# Patient Record
Sex: Female | Born: 1960 | Race: Black or African American | Hispanic: No | Marital: Married | Smoking: Never smoker
Health system: Southern US, Community
[De-identification: ages and names within clinical notes are randomized; demographics above are authoritative.]

## PROBLEM LIST (undated history)

## (undated) DIAGNOSIS — D649 Anemia, unspecified: Secondary | ICD-10-CM

## (undated) DIAGNOSIS — M1990 Unspecified osteoarthritis, unspecified site: Secondary | ICD-10-CM

## (undated) DIAGNOSIS — Q6102 Congenital multiple renal cysts: Secondary | ICD-10-CM

## (undated) DIAGNOSIS — Z8719 Personal history of other diseases of the digestive system: Secondary | ICD-10-CM

## (undated) DIAGNOSIS — R600 Localized edema: Secondary | ICD-10-CM

## (undated) DIAGNOSIS — R7303 Prediabetes: Secondary | ICD-10-CM

## (undated) DIAGNOSIS — D571 Sickle-cell disease without crisis: Secondary | ICD-10-CM

## (undated) DIAGNOSIS — E785 Hyperlipidemia, unspecified: Secondary | ICD-10-CM

## (undated) DIAGNOSIS — D573 Sickle-cell trait: Secondary | ICD-10-CM

## (undated) DIAGNOSIS — I639 Cerebral infarction, unspecified: Secondary | ICD-10-CM

## (undated) DIAGNOSIS — F32A Depression, unspecified: Secondary | ICD-10-CM

## (undated) DIAGNOSIS — D219 Benign neoplasm of connective and other soft tissue, unspecified: Secondary | ICD-10-CM

## (undated) DIAGNOSIS — M179 Osteoarthritis of knee, unspecified: Secondary | ICD-10-CM

## (undated) DIAGNOSIS — T7840XA Allergy, unspecified, initial encounter: Secondary | ICD-10-CM

## (undated) DIAGNOSIS — F329 Major depressive disorder, single episode, unspecified: Secondary | ICD-10-CM

## (undated) DIAGNOSIS — Z780 Asymptomatic menopausal state: Secondary | ICD-10-CM

## (undated) DIAGNOSIS — M722 Plantar fascial fibromatosis: Secondary | ICD-10-CM

## (undated) DIAGNOSIS — R310 Gross hematuria: Secondary | ICD-10-CM

## (undated) DIAGNOSIS — J302 Other seasonal allergic rhinitis: Secondary | ICD-10-CM

## (undated) DIAGNOSIS — Z86010 Personal history of colonic polyps: Secondary | ICD-10-CM

## (undated) DIAGNOSIS — N172 Acute kidney failure with medullary necrosis: Secondary | ICD-10-CM

## (undated) DIAGNOSIS — E669 Obesity, unspecified: Secondary | ICD-10-CM

## (undated) DIAGNOSIS — M1710 Unilateral primary osteoarthritis, unspecified knee: Secondary | ICD-10-CM

## (undated) DIAGNOSIS — K5730 Diverticulosis of large intestine without perforation or abscess without bleeding: Secondary | ICD-10-CM

## (undated) HISTORY — DX: Plantar fascial fibromatosis: M72.2

## (undated) HISTORY — PX: POLYPECTOMY: SHX149

## (undated) HISTORY — DX: Sickle-cell disease without crisis: D57.1

## (undated) HISTORY — DX: Sickle-cell trait: D57.3

## (undated) HISTORY — DX: Asymptomatic menopausal state: Z78.0

## (undated) HISTORY — DX: Allergy, unspecified, initial encounter: T78.40XA

## (undated) HISTORY — DX: Acute kidney failure with medullary necrosis: N17.2

## (undated) HISTORY — PX: COLONOSCOPY: SHX174

## (undated) HISTORY — DX: Hyperlipidemia, unspecified: E78.5

## (undated) HISTORY — DX: Obesity, unspecified: E66.9

## (undated) HISTORY — DX: Unspecified osteoarthritis, unspecified site: M19.90

---
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
## 1998-08-28 ENCOUNTER — Encounter: Admission: RE | Admit: 1998-08-28 | Discharge: 1998-08-28 | Payer: Self-pay | Admitting: Family Medicine

## 1998-09-04 ENCOUNTER — Encounter: Admission: RE | Admit: 1998-09-04 | Discharge: 1998-09-04 | Payer: Self-pay | Admitting: Family Medicine

## 1998-09-04 ENCOUNTER — Other Ambulatory Visit: Admission: RE | Admit: 1998-09-04 | Discharge: 1998-09-04 | Payer: Self-pay | Admitting: *Deleted

## 1998-09-24 ENCOUNTER — Encounter: Admission: RE | Admit: 1998-09-24 | Discharge: 1998-09-24 | Payer: Self-pay | Admitting: Sports Medicine

## 1999-10-06 ENCOUNTER — Encounter: Admission: RE | Admit: 1999-10-06 | Discharge: 1999-10-06 | Payer: Self-pay | Admitting: Family Medicine

## 1999-10-11 ENCOUNTER — Emergency Department (HOSPITAL_COMMUNITY): Admission: EM | Admit: 1999-10-11 | Discharge: 1999-10-11 | Payer: Self-pay | Admitting: Emergency Medicine

## 1999-10-12 ENCOUNTER — Encounter: Payer: Self-pay | Admitting: Emergency Medicine

## 1999-10-15 ENCOUNTER — Encounter (INDEPENDENT_AMBULATORY_CARE_PROVIDER_SITE_OTHER): Payer: Self-pay | Admitting: *Deleted

## 1999-10-15 LAB — CONVERTED CEMR LAB

## 1999-10-20 ENCOUNTER — Encounter: Admission: RE | Admit: 1999-10-20 | Discharge: 1999-10-20 | Payer: Self-pay | Admitting: Family Medicine

## 2000-07-13 ENCOUNTER — Encounter: Admission: RE | Admit: 2000-07-13 | Discharge: 2000-07-13 | Payer: Self-pay | Admitting: Family Medicine

## 2000-07-23 ENCOUNTER — Encounter: Payer: Self-pay | Admitting: Sports Medicine

## 2000-07-23 ENCOUNTER — Encounter: Admission: RE | Admit: 2000-07-23 | Discharge: 2000-07-23 | Payer: Self-pay | Admitting: Sports Medicine

## 2002-03-16 HISTORY — PX: ABDOMINAL HYSTERECTOMY: SHX81

## 2002-08-15 DIAGNOSIS — D219 Benign neoplasm of connective and other soft tissue, unspecified: Secondary | ICD-10-CM

## 2002-08-15 HISTORY — DX: Benign neoplasm of connective and other soft tissue, unspecified: D21.9

## 2002-08-21 ENCOUNTER — Encounter: Admission: RE | Admit: 2002-08-21 | Discharge: 2002-08-21 | Payer: Self-pay | Admitting: Sports Medicine

## 2002-08-25 ENCOUNTER — Encounter: Admission: RE | Admit: 2002-08-25 | Discharge: 2002-08-25 | Payer: Self-pay | Admitting: Sports Medicine

## 2002-08-25 ENCOUNTER — Encounter: Payer: Self-pay | Admitting: Sports Medicine

## 2002-09-20 ENCOUNTER — Encounter: Admission: RE | Admit: 2002-09-20 | Discharge: 2002-09-20 | Payer: Self-pay | Admitting: Family Medicine

## 2002-11-14 ENCOUNTER — Encounter: Admission: RE | Admit: 2002-11-14 | Discharge: 2002-11-14 | Payer: Self-pay | Admitting: Obstetrics and Gynecology

## 2002-11-14 ENCOUNTER — Encounter: Payer: Self-pay | Admitting: Obstetrics and Gynecology

## 2003-01-11 ENCOUNTER — Encounter (INDEPENDENT_AMBULATORY_CARE_PROVIDER_SITE_OTHER): Payer: Self-pay | Admitting: Specialist

## 2003-01-11 ENCOUNTER — Inpatient Hospital Stay (HOSPITAL_COMMUNITY): Admission: RE | Admit: 2003-01-11 | Discharge: 2003-01-13 | Payer: Self-pay | Admitting: Obstetrics and Gynecology

## 2004-03-16 HISTORY — PX: APPENDECTOMY: SHX54

## 2005-03-13 ENCOUNTER — Ambulatory Visit: Payer: Self-pay | Admitting: Sports Medicine

## 2005-04-01 ENCOUNTER — Encounter: Admission: RE | Admit: 2005-04-01 | Discharge: 2005-04-01 | Payer: Self-pay | Admitting: Sports Medicine

## 2005-06-05 ENCOUNTER — Ambulatory Visit: Payer: Self-pay | Admitting: Family Medicine

## 2006-05-13 DIAGNOSIS — D573 Sickle-cell trait: Secondary | ICD-10-CM | POA: Insufficient documentation

## 2006-05-14 ENCOUNTER — Encounter (INDEPENDENT_AMBULATORY_CARE_PROVIDER_SITE_OTHER): Payer: Self-pay | Admitting: *Deleted

## 2006-06-16 ENCOUNTER — Encounter: Payer: Self-pay | Admitting: Family Medicine

## 2006-06-16 ENCOUNTER — Ambulatory Visit (HOSPITAL_COMMUNITY): Admission: RE | Admit: 2006-06-16 | Discharge: 2006-06-16 | Payer: Self-pay | Admitting: Family Medicine

## 2006-06-16 ENCOUNTER — Encounter (INDEPENDENT_AMBULATORY_CARE_PROVIDER_SITE_OTHER): Payer: Self-pay | Admitting: Family Medicine

## 2006-06-16 ENCOUNTER — Ambulatory Visit: Payer: Self-pay | Admitting: Family Medicine

## 2006-06-16 DIAGNOSIS — J301 Allergic rhinitis due to pollen: Secondary | ICD-10-CM | POA: Insufficient documentation

## 2006-06-16 LAB — CONVERTED CEMR LAB: HCT: 42.7 %

## 2006-06-17 ENCOUNTER — Encounter (INDEPENDENT_AMBULATORY_CARE_PROVIDER_SITE_OTHER): Payer: Self-pay | Admitting: Family Medicine

## 2006-06-17 LAB — CONVERTED CEMR LAB
Cholesterol: 185 mg/dL (ref 0–200)
Triglycerides: 135 mg/dL (ref <150)
VLDL: 27 mg/dL (ref 0–40)

## 2006-07-30 ENCOUNTER — Telehealth: Payer: Self-pay | Admitting: *Deleted

## 2006-07-30 ENCOUNTER — Ambulatory Visit: Payer: Self-pay | Admitting: Family Medicine

## 2007-05-27 ENCOUNTER — Encounter: Payer: Self-pay | Admitting: Family Medicine

## 2007-05-27 ENCOUNTER — Ambulatory Visit: Payer: Self-pay | Admitting: Family Medicine

## 2007-05-27 ENCOUNTER — Telehealth: Payer: Self-pay | Admitting: *Deleted

## 2007-05-27 DIAGNOSIS — N059 Unspecified nephritic syndrome with unspecified morphologic changes: Secondary | ICD-10-CM | POA: Insufficient documentation

## 2007-05-27 LAB — CONVERTED CEMR LAB
ALT: 21 units/L (ref 0–35)
AST: 27 units/L (ref 0–37)
Alkaline Phosphatase: 69 units/L (ref 39–117)
Alpha-1-Globulin: 3.9 % (ref 2.9–4.9)
Alpha-2-Globulin: 8.4 % (ref 7.1–11.8)
Anti Nuclear Antibody(ANA): NEGATIVE
Bilirubin Urine: NEGATIVE
Complement C4, Body Fluid: 20 mg/dL (ref 16–47)
Creatinine, Ser: 0.8 mg/dL (ref 0.40–1.20)
Glucose, Urine, Semiquant: NEGATIVE
Ketones, urine, test strip: NEGATIVE
Protein, U semiquant: 100
Specific Gravity, Urine: 1.015
Total Bilirubin: 0.5 mg/dL (ref 0.3–1.2)
Total Protein, Serum Electrophoresis: 7.3 g/dL (ref 6.0–8.3)

## 2007-05-29 ENCOUNTER — Encounter: Payer: Self-pay | Admitting: Family Medicine

## 2007-05-29 DIAGNOSIS — J309 Allergic rhinitis, unspecified: Secondary | ICD-10-CM | POA: Insufficient documentation

## 2007-05-31 ENCOUNTER — Telehealth: Payer: Self-pay | Admitting: Family Medicine

## 2007-05-31 ENCOUNTER — Encounter: Payer: Self-pay | Admitting: Family Medicine

## 2007-05-31 LAB — CONVERTED CEMR LAB: Protein, Ur: 144 mg/24hr — ABNORMAL HIGH (ref 50–100)

## 2007-06-10 ENCOUNTER — Telehealth (INDEPENDENT_AMBULATORY_CARE_PROVIDER_SITE_OTHER): Payer: Self-pay | Admitting: *Deleted

## 2007-06-14 ENCOUNTER — Telehealth: Payer: Self-pay | Admitting: *Deleted

## 2007-06-16 ENCOUNTER — Encounter (INDEPENDENT_AMBULATORY_CARE_PROVIDER_SITE_OTHER): Payer: Self-pay | Admitting: Family Medicine

## 2007-06-16 ENCOUNTER — Ambulatory Visit: Payer: Self-pay | Admitting: Family Medicine

## 2007-06-16 ENCOUNTER — Encounter: Payer: Self-pay | Admitting: *Deleted

## 2007-06-16 LAB — CONVERTED CEMR LAB
Blood in Urine, dipstick: NEGATIVE
CO2: 25 meq/L (ref 19–32)
Calcium: 9.9 mg/dL (ref 8.4–10.5)
Ketones, urine, test strip: NEGATIVE
Nitrite: NEGATIVE
Protein, U semiquant: NEGATIVE
Sodium: 144 meq/L (ref 135–145)
pH: 7.5

## 2007-06-20 ENCOUNTER — Telehealth (INDEPENDENT_AMBULATORY_CARE_PROVIDER_SITE_OTHER): Payer: Self-pay | Admitting: Family Medicine

## 2007-06-22 ENCOUNTER — Encounter (INDEPENDENT_AMBULATORY_CARE_PROVIDER_SITE_OTHER): Payer: Self-pay | Admitting: Family Medicine

## 2007-06-23 ENCOUNTER — Telehealth: Payer: Self-pay | Admitting: *Deleted

## 2007-06-27 ENCOUNTER — Telehealth (INDEPENDENT_AMBULATORY_CARE_PROVIDER_SITE_OTHER): Payer: Self-pay | Admitting: Family Medicine

## 2008-02-07 ENCOUNTER — Telehealth (INDEPENDENT_AMBULATORY_CARE_PROVIDER_SITE_OTHER): Payer: Self-pay | Admitting: *Deleted

## 2008-02-07 ENCOUNTER — Ambulatory Visit: Payer: Self-pay | Admitting: Family Medicine

## 2008-02-07 LAB — CONVERTED CEMR LAB
Bilirubin Urine: NEGATIVE
Blood in Urine, dipstick: NEGATIVE
Ketones, urine, test strip: NEGATIVE
Nitrite: NEGATIVE
Specific Gravity, Urine: 1.015
pH: 7

## 2008-02-08 ENCOUNTER — Encounter (INDEPENDENT_AMBULATORY_CARE_PROVIDER_SITE_OTHER): Payer: Self-pay | Admitting: Family Medicine

## 2008-03-06 ENCOUNTER — Telehealth: Payer: Self-pay | Admitting: *Deleted

## 2008-05-14 LAB — CONVERTED CEMR LAB

## 2008-05-21 ENCOUNTER — Encounter (INDEPENDENT_AMBULATORY_CARE_PROVIDER_SITE_OTHER): Payer: Self-pay | Admitting: Family Medicine

## 2008-05-21 ENCOUNTER — Ambulatory Visit: Payer: Self-pay | Admitting: Family Medicine

## 2008-05-21 ENCOUNTER — Telehealth: Payer: Self-pay | Admitting: *Deleted

## 2008-05-21 DIAGNOSIS — R319 Hematuria, unspecified: Secondary | ICD-10-CM | POA: Insufficient documentation

## 2008-05-21 LAB — CONVERTED CEMR LAB
Bilirubin Urine: NEGATIVE
Nitrite: POSITIVE
Protein, U semiquant: 30
Urobilinogen, UA: 0.2

## 2008-05-22 ENCOUNTER — Encounter: Payer: Self-pay | Admitting: *Deleted

## 2008-05-24 ENCOUNTER — Encounter: Admission: RE | Admit: 2008-05-24 | Discharge: 2008-05-24 | Payer: Self-pay | Admitting: Family Medicine

## 2008-05-24 DIAGNOSIS — Q6102 Congenital multiple renal cysts: Secondary | ICD-10-CM

## 2008-05-24 HISTORY — DX: Congenital multiple renal cysts: Q61.02

## 2008-05-25 ENCOUNTER — Telehealth: Payer: Self-pay | Admitting: *Deleted

## 2008-05-25 ENCOUNTER — Encounter (INDEPENDENT_AMBULATORY_CARE_PROVIDER_SITE_OTHER): Payer: Self-pay | Admitting: Family Medicine

## 2008-09-27 ENCOUNTER — Ambulatory Visit: Payer: Self-pay | Admitting: Family Medicine

## 2008-09-27 ENCOUNTER — Encounter: Payer: Self-pay | Admitting: Family Medicine

## 2008-09-27 LAB — CONVERTED CEMR LAB
ALT: 21 units/L (ref 0–35)
AST: 19 units/L (ref 0–37)
Albumin: 4.2 g/dL (ref 3.5–5.2)
Alkaline Phosphatase: 73 units/L (ref 39–117)
BUN: 9 mg/dL (ref 6–23)
CO2: 25 meq/L (ref 19–32)
Calcium: 8.7 mg/dL (ref 8.4–10.5)
Chloride: 106 meq/L (ref 96–112)
Cholesterol: 155 mg/dL (ref 0–200)
Creatinine, Ser: 0.95 mg/dL (ref 0.40–1.20)
Glucose, Bld: 89 mg/dL (ref 70–99)
Glucose, Urine, Semiquant: NEGATIVE
HDL: 46 mg/dL (ref >39)
LDL Cholesterol: 88 mg/dL (ref 0–99)
Potassium: 4.2 meq/L (ref 3.5–5.3)
Protein, U semiquant: NEGATIVE
Sodium: 142 meq/L (ref 135–145)
TSH: 0.99 microintl units/mL (ref 0.350–4.500)
Total Bilirubin: 0.7 mg/dL (ref 0.3–1.2)
Total CHOL/HDL Ratio: 3.4
Total Protein: 6.6 g/dL (ref 6.0–8.3)
Triglycerides: 104 mg/dL (ref <150)
VLDL: 21 mg/dL (ref 0–40)

## 2008-10-02 ENCOUNTER — Telehealth: Payer: Self-pay | Admitting: Family Medicine

## 2009-01-03 ENCOUNTER — Ambulatory Visit: Payer: Self-pay | Admitting: Family Medicine

## 2009-01-03 ENCOUNTER — Telehealth: Payer: Self-pay | Admitting: Family Medicine

## 2009-01-03 LAB — CONVERTED CEMR LAB
Glucose, Urine, Semiquant: NEGATIVE
Ketones, urine, test strip: NEGATIVE
Nitrite: NEGATIVE
Specific Gravity, Urine: 1.015
pH: 7

## 2009-04-10 ENCOUNTER — Telehealth: Payer: Self-pay | Admitting: Family Medicine

## 2009-04-10 ENCOUNTER — Ambulatory Visit: Payer: Self-pay | Admitting: Family Medicine

## 2009-04-10 LAB — CONVERTED CEMR LAB
Bilirubin Urine: NEGATIVE
Ketones, urine, test strip: NEGATIVE
Nitrite: POSITIVE
Protein, U semiquant: NEGATIVE
RBC / HPF: >20
Urobilinogen, UA: 0.2

## 2009-04-11 ENCOUNTER — Encounter: Payer: Self-pay | Admitting: Family Medicine

## 2009-05-17 ENCOUNTER — Encounter: Payer: Self-pay | Admitting: Family Medicine

## 2009-05-17 ENCOUNTER — Telehealth: Payer: Self-pay | Admitting: Family Medicine

## 2009-05-17 ENCOUNTER — Ambulatory Visit: Payer: Self-pay | Admitting: Family Medicine

## 2009-05-17 LAB — CONVERTED CEMR LAB
MCV: 89.1 fL (ref 78.0–100.0)
Platelets: 249 10*3/uL (ref 150–400)
WBC: 7.7 10*3/uL (ref 4.0–10.5)

## 2009-08-26 ENCOUNTER — Ambulatory Visit: Payer: Self-pay | Admitting: Family Medicine

## 2009-08-26 DIAGNOSIS — N959 Unspecified menopausal and perimenopausal disorder: Secondary | ICD-10-CM | POA: Insufficient documentation

## 2009-08-26 DIAGNOSIS — M1711 Unilateral primary osteoarthritis, right knee: Secondary | ICD-10-CM | POA: Insufficient documentation

## 2009-08-29 ENCOUNTER — Encounter: Admission: RE | Admit: 2009-08-29 | Discharge: 2009-08-29 | Payer: Self-pay | Admitting: Family Medicine

## 2009-09-20 ENCOUNTER — Telehealth: Payer: Self-pay | Admitting: Family Medicine

## 2009-09-23 ENCOUNTER — Ambulatory Visit: Payer: Self-pay | Admitting: Family Medicine

## 2009-09-23 LAB — CONVERTED CEMR LAB
Glucose, Urine, Semiquant: NEGATIVE
Nitrite: NEGATIVE
Protein, U semiquant: NEGATIVE
Specific Gravity, Urine: 1.02
Urobilinogen, UA: 0.2

## 2009-09-24 ENCOUNTER — Encounter: Payer: Self-pay | Admitting: Family Medicine

## 2009-09-26 ENCOUNTER — Telehealth: Payer: Self-pay | Admitting: Family Medicine

## 2009-09-30 ENCOUNTER — Encounter: Payer: Self-pay | Admitting: Family Medicine

## 2009-10-11 ENCOUNTER — Ambulatory Visit: Payer: Self-pay | Admitting: Family Medicine

## 2009-10-11 ENCOUNTER — Encounter: Payer: Self-pay | Admitting: Family Medicine

## 2009-10-11 LAB — CONVERTED CEMR LAB
Bilirubin Urine: NEGATIVE
Glucose, Bld: 135 mg/dL — ABNORMAL HIGH (ref 70–99)
Potassium: 3.6 meq/L (ref 3.5–5.3)
Sodium: 144 meq/L (ref 135–145)
pH: 6

## 2009-10-16 ENCOUNTER — Telehealth: Payer: Self-pay | Admitting: Family Medicine

## 2009-11-19 ENCOUNTER — Telehealth: Payer: Self-pay | Admitting: Family Medicine

## 2009-11-19 ENCOUNTER — Ambulatory Visit: Payer: Self-pay | Admitting: Family Medicine

## 2009-11-19 LAB — CONVERTED CEMR LAB
Bilirubin Urine: NEGATIVE
Glucose, Urine, Semiquant: NEGATIVE
Ketones, urine, test strip: NEGATIVE
Nitrite: NEGATIVE
Protein, U semiquant: NEGATIVE
Specific Gravity, Urine: 1.02
Urobilinogen, UA: 0.2
pH: 8

## 2009-11-20 ENCOUNTER — Encounter: Payer: Self-pay | Admitting: Family Medicine

## 2009-12-29 ENCOUNTER — Encounter: Payer: Self-pay | Admitting: Family Medicine

## 2009-12-29 DIAGNOSIS — J45909 Unspecified asthma, uncomplicated: Secondary | ICD-10-CM | POA: Insufficient documentation

## 2010-01-10 ENCOUNTER — Telehealth: Payer: Self-pay | Admitting: *Deleted

## 2010-01-10 ENCOUNTER — Ambulatory Visit: Payer: Self-pay | Admitting: Family Medicine

## 2010-01-10 LAB — CONVERTED CEMR LAB
Nitrite: NEGATIVE
Specific Gravity, Urine: 1.02

## 2010-02-25 ENCOUNTER — Telehealth: Payer: Self-pay | Admitting: Family Medicine

## 2010-03-31 ENCOUNTER — Encounter: Payer: Self-pay | Admitting: Family Medicine

## 2010-03-31 ENCOUNTER — Ambulatory Visit
Admission: RE | Admit: 2010-03-31 | Discharge: 2010-03-31 | Payer: Self-pay | Source: Home / Self Care | Attending: Family Medicine | Admitting: Family Medicine

## 2010-03-31 DIAGNOSIS — N390 Urinary tract infection, site not specified: Secondary | ICD-10-CM | POA: Insufficient documentation

## 2010-03-31 LAB — CONVERTED CEMR LAB
Bilirubin Urine: NEGATIVE
Glucose, Urine, Semiquant: NEGATIVE
Ketones, urine, test strip: NEGATIVE
Specific Gravity, Urine: 1.015
Urobilinogen, UA: 0.2
WBC, UA: >20 cells/hpf

## 2010-04-17 NOTE — Assessment & Plan Note (Signed)
Summary: rash on legs   Vital Signs:  Patient profile:   50 year old female Weight:      194.8 pounds Temp:     98.5 degrees F oral Pulse rate:   83 / minute Pulse rhythm:   regular BP sitting:   132 / 84  (right arm)  Vitals Entered By: Loralee Pacas CMA (May 17, 2009 2:39 PM) Comments rash on both legs started saturday at work only on left leg and now is on the right leg. tried benadryl. no changes in soaps,detergents, or lotions   Primary Care Provider:  Helane Rima DO   History of Present Illness: rash on legs x 1 week:  +redness, itching, mostly on back of legs. no new lotions, detergents.  never had before.  applying alchol, witch hazel, peroxide nothing helps itching.  benedryl antitich helped some.    took by mouth benedryl and motrin neither helped.  States no fever.  No hx of cellulitis.  No hx of similiar allergic reaction.  Current Medications (verified): 1)  Claritin 10 Mg  Tabs (Loratadine) .Marland Kitchen.. 1 Tab By Mouth Daily 2)  Multivitamins   Tabs (Multiple Vitamin) .Marland Kitchen.. 1 Tab By Mouth Daily 3)  Ventolin Hfa 108 (90 Base) Mcg/act Aers (Albuterol Sulfate) .... Inhale 2 Puffs Q4h As Needed For Shortness of Breath 4)  Advair Diskus 100-50 Mcg/dose Misc (Fluticasone-Salmeterol) .Marland Kitchen.. 1 Puff Two Times A Day 5)  Ibuprofen 600 Mg  Tabs (Ibuprofen) .... One By Mouth Three Times A Day With Food 6)  Sulfamethoxazole-Tmp Ds 800-160 Mg Tabs (Sulfamethoxazole-Trimethoprim) .Marland Kitchen.. 1 Tab By Mouth Two Times A Day For 7 Days 7)  Triamcinolone Acetonide 0.5 % Crea (Triamcinolone Acetonide) .... Apply To Affected Area Two Times A Day.  Allergies (verified): No Known Drug Allergies  Review of Systems       The patient complains of anorexia.  The patient denies fever, weight gain, abdominal pain, and angioedema.    Physical Exam  General:  VSS Well-developed,well-nourished,in no acute distress; alert,appropriate and cooperative throughout examination Extremities:  No ankle edema Skin:   Red, indurated skin present on back of upper leg and upper 1/2 of calf on both left and right legs.  No redness or swelling on front of legs.  No vesicles, pustules, or drainage.  Mild warmth to touch to back of upper legs.  No warmth to touch in calf bilateral.     Impression & Recommendations:  Problem # 1:  CONTACT DERMATITIS&OTHER ECZEMA DUE UNSPEC CAUSE (ICD-692.9) most likely contact dermatitis.  Although no known cause of new exposure that could cause allergic reaction to back of legs bilateral.  Some concern for cellulitis- but no fever, no elevated WBC count, and it would be rare to have a bilateral cellulitis located only to the back of the legs bilateral.  Pt given triamcinolone to treat for contact dermatitis.  Pt instructed to go directly to the ER if any fever, increase in redness to back of legs, or any new or worsening of symptoms.  Pt states understanding of red flags for cellulitis.   Pt to return within next week for wound recheck.     Her updated medication list for this problem includes:    Claritin 10 Mg Tabs (Loratadine) .Marland Kitchen... 1 tab by mouth daily    Triamcinolone Acetonide 0.5 % Crea (Triamcinolone acetonide) .Marland Kitchen... Apply to affected area two times a day.  Orders: [facility]- Est Level  3 ([address]213) [facility] ([address])  Complete Medication List: 1)  Claritin 10 Mg Tabs (Loratadine) .Marland Kitchen.. 1 tab by mouth daily 2)  Multivitamins Tabs (Multiple vitamin) .Marland Kitchen.. 1 tab by mouth daily 3)  Ventolin Hfa 108 (90 Base) Mcg/act Aers (Albuterol sulfate) .... Inhale 2 puffs q4h as needed for shortness of breath 4)  Advair Diskus 100-50 Mcg/dose Misc (Fluticasone-salmeterol) .Marland Kitchen.. 1 puff two times a day 5)  Ibuprofen 600 Mg Tabs (Ibuprofen) .... One by mouth three times a day with food 6)  Sulfamethoxazole-tmp Ds 800-160 Mg Tabs (Sulfamethoxazole-trimethoprim) .Marland Kitchen.. 1 tab by mouth two times a day for 7 days 7)  Triamcinolone Acetonide 0.5 % Crea (Triamcinolone acetonide) .... Apply to affected area two  times a day. Prescriptions: TRIAMCINOLONE ACETONIDE 0.5 % CREA (TRIAMCINOLONE ACETONIDE) apply to affected area two times a day.  #1 tube x 1   Entered and Authorized by:   Ellin Mayhew MD   Signed by:   Ellin Mayhew MD on 05/17/2009   Method used:   Electronically to        [facility] [id number] (retail)       [address] [place], [place]  [address]       Ph: [phone]       Fax: [phone]   RxID:   [id number]

## 2010-04-17 NOTE — Progress Notes (Signed)
Summary: Returning call  Medications Added CEPHALEXIN 500 MG CAPS (CEPHALEXIN) Take 1 tab two times a day for 7 days       Phone Note Call from Patient Call back at Home Phone [phone]   Reason for Call: Talk to Nurse Summary of Call: pt is returning someone's call Initial call taken by: Knox Royalty,  September 26, 2009 8:48 AM  Follow-up for Phone Call        Discussed with patient lab results and need for treatment.  She voiced her understanding and agreement. Follow-up by: Angelena Sole MD,  September 26, 2009 10:37 AM    New/Updated Medications: CEPHALEXIN 500 MG CAPS (CEPHALEXIN) Take 1 tab two times a day for 7 days Prescriptions: CEPHALEXIN 500 MG CAPS (CEPHALEXIN) Take 1 tab two times a day for 7 days  #14 x 0   Entered and Authorized by:   Angelena Sole MD   Signed by:   Angelena Sole MD on 09/26/2009   Method used:   Electronically to        [facility] [id number] (retail)       [address] [place], [place]  [address]       Ph: [phone]       Fax: [phone]   RxID:   [id number]

## 2010-04-17 NOTE — Progress Notes (Signed)
Summary: phn msg-urine cx followup    Phone Note Call from Patient Call back at Home Phone [phone]   Caller: Patient Summary of Call: Pt returning call not sure from who, but thinks someone may have been calling about the lab work she just had. Initial call taken by: Clydell Hakim,  October 16, 2009 1:51 PM  Follow-up for Phone Call        fwd. to Dr.Newton  Follow-up by: Arlyss Repress CMA,,  October 16, 2009 3:06 PM  Additional Follow-up for Phone Call Additional follow up Details #1::        Call and spoke to pt's spouse (pt unavailable). Instructed for pt to pickup Rx for Keflex and importance in compliance of completing dose. Spouse agreeable to informing pt.  Doree Albee MD October 17, 2009 7:47 PM     Prescriptions: CEPHALEXIN 500 MG CAPS (CEPHALEXIN) Take 1 tab two times a day for 7 days  #14 x 0   Entered and Authorized by:   Doree Albee MD   Signed by:   Doree Albee MD on 10/17/2009   Method used:   Print then Give to Patient   RxID:   [id number]   Appended Document: phn msg-urine cx followup  meds were to be faxed [facility] [address] and pt states that they do not have it. pls advise

## 2010-04-17 NOTE — Progress Notes (Signed)
Summary: triage   Phone Note Call from Patient Call back at Home Phone [phone]   Caller: Patient Summary of Call: saw blood in urine on Sunday and a little back pain- feels like she is getting another UTI Initial call taken by: De Nurse,  November 19, 2009 10:07 AM  Follow-up for Phone Call        she will see her pcp today at 3:15. advised tylenol & plenty of water Follow-up by: Golden Circle RN,  November 19, 2009 10:12 AM

## 2010-04-17 NOTE — Miscellaneous (Signed)
Summary: Asthma Update - Intermittent     New Problems: ASTHMA, INTERMITTENT (ICD-493.90)   New Problems: ASTHMA, INTERMITTENT (ICD-493.90) 

## 2010-04-17 NOTE — Assessment & Plan Note (Signed)
Summary: UTI PER PT/[place]/Maryagnes Carrasco   Vital Signs:  Patient profile:   50 year old female Weight:      193.4 pounds Temp:     99.5 degrees F oral Pulse rate:   71 / minute BP sitting:   147 / 93  (right arm) Cuff size:   large  Vitals Entered By: Loralee Pacas CMA (November 19, 2009 3:47 PM)  Primary Care Provider:  Helane Rima DO  CC:  hematuria.  History of Present Illness: 50 yo F:  1. Hematuria: Hx of, always with UTI. Had one episode yesterday. Associated with mild suprapubic tenderness, mild left back pain. No dysuria, frequency, fever/chills, N/V/D. Pt reports hematuria in past when taking NSAIDs. No fever, abd pain, dysuria, flank pain, N/V/D/C. No change in sexual activity. No chnage in menstrual cycle s/p hysterectomy several years ago. Pt recently treated fro UTI, being found to have E. Coli UTI - treated w/ keflex x7 days. Previously seen at  urology w/ essentially negative workup. Recommended to start prophylactic Abx - however pt declined (see urology report for full details).   Current Medications (verified): 1)  Claritin 10 Mg  Tabs (Loratadine) .Marland Kitchen.. 1 Tab By Mouth Daily 2)  Multivitamins   Tabs (Multiple Vitamin) .Marland Kitchen.. 1 Tab By Mouth Daily 3)  Ventolin Hfa 108 (90 Base) Mcg/act Aers (Albuterol Sulfate) .... Inhale 2 Puffs Q4h As Needed For Shortness of Breath 4)  Triamcinolone Acetonide 0.5 % Crea (Triamcinolone Acetonide) .... Apply To Affected Area Two Times A Day. 5)  Meloxicam 15 Mg Tabs (Meloxicam) .... One By Mouth Daily 6)  Estrace 0.1 Mg/gm Crea (Estradiol) .Marland Kitchen.. 1 G Pv 1 Time Per Week X 6 Weeks  Allergies (verified): 1)  ! Nsaids PMH-FH-SH reviewed for relevance  Review of Systems      See HPI  Physical Exam  General:  Alert, well-nourished, and well-hydrated. Vitals reviewed. Abdomen:  Bowel sounds positive,abdomen soft and non-tender without masses, organomegaly or hernias noted. Psych:  Oriented X3, memory intact for recent and remote, normally  interactive, good eye contact, not anxious appearing, and not depressed appearing.     Impression & Recommendations:  Problem # 1:  GROSS HEMATURIA (ICD-599.71) Assessment Improved  Need to evaluate UA when patient having no symptoms of UTI.  Her updated medication list for this problem includes:    Cephalexin 500 Mg Tabs (Cephalexin) .Marland Kitchen... Take one by mouth 2 times daily x 10 days  Orders: [facility]- Est Level  3 ([address])  Problem # 2:  UTI (ICD-599.0) Assessment: Deteriorated  +/- on UA. Will go ahead and treat while awaiting UCx.  Her updated medication list for this problem includes:    Cephalexin 500 Mg Tabs (Cephalexin) .Marland Kitchen... Take one by mouth 2 times daily x 10 days  Orders: [facility]- Est Level  3 ([address]454)  Problem # 3:  POSTMENOPAUSAL SYNDROME (ICD-627.9) Assessment: Unchanged  Rec starting (as she has not picked it up yet) the estrogen cream to see if this decreases frequency of UTIs. Her updated medication list for this problem includes:    Estrace 0.1 Mg/gm Crea (Estradiol) .Marland Kitchen... 1 g pv 1 time per week x 6 weeks  Orders: [facility]- Est Level  3 ([address]11)  Complete Medication List: 1)  Claritin 10 Mg Tabs (Loratadine) .Marland Kitchen.. 1 tab by mouth daily 2)  Multivitamins Tabs (Multiple vitamin) .Marland Kitchen.. 1 tab by mouth daily 3)  Ventolin Hfa 108 (90 Base) Mcg/act Aers (Albuterol sulfate) .... Inhale 2 puffs q4h as needed for shortness  of breath 4)  Triamcinolone Acetonide 0.5 % Crea (Triamcinolone acetonide) .... Apply to affected area two times a day. 5)  Meloxicam 15 Mg Tabs (Meloxicam) .... One by mouth daily 6)  Estrace 0.1 Mg/gm Crea (Estradiol) .Marland Kitchen.. 1 g pv 1 time per week x 6 weeks 7)  Cephalexin 500 Mg Tabs (Cephalexin) .... Take one by mouth 2 times daily x 10 days  Other Orders: [facility]inal[facility] (00000) Urine Culture[facility] ([id number])  Patient Instructions: 1)  It was nice to see you today! 2)  Take all of your medication. 3)  Follow up in one month. Prescriptions: CEPHALEXIN 500 MG   TABS (CEPHALEXIN) take one by mouth 2 times daily x 10 days  #20 x 0   Entered and Authorized by:   Helane Rima DO   Signed by:   Helane Rima DO on 11/19/2009   Method used:   Electronically to        [facility] [id number] (retail)       [address]       [place], [place]  [address]       Ph: [phone]       Fax: [phone]   RxID:   [id number]     Laboratory Results   Urine Tests  Date/Time Received: November 19, 2009 3:51 PM  Date/Time Reported: November 19, 2009 4:07 PM   Routine Urinalysis   Color: yellow Appearance: Hazy Glucose: negative   (Normal Range: Negative) Bilirubin: negative   (Normal Range: Negative) Ketone: negative   (Normal Range: Negative) Spec. Gravity: 1.020   (Normal Range: 1.003-1.035) Blood: trace-intact   (Normal Range: Negative) pH: 8.0   (Normal Range: 5.0-8.0) Protein: negative   (Normal Range: Negative) Urobilinogen: 0.2   (Normal Range: 0-1) Nitrite: negative   (Normal Range: Negative) Leukocyte Esterace: moderate   (Normal Range: Negative)  Urine Microscopic WBC/HPF: 1-5 RBC/HPF: rare Bacteria/HPF: 3+ rods Epithelial/HPF: occ    Comments: ...........test performed by...........Marland KitchenTerese Door, CMA

## 2010-04-17 NOTE — Progress Notes (Signed)
Summary: UPDATE PROBLEM LIST   

## 2010-04-17 NOTE — Progress Notes (Signed)
Summary: triage   Phone Note Call from Patient Call back at [phone]   Caller: Patient Summary of Call: has reoccurring bladder infs and has another one with blood in urine.  wants to know if something can be called in or does she need to come in. Initial call taken by: De Nurse,  April 10, 2009 9:46 AM  Follow-up for Phone Call        left message asking her to call back & make appt Follow-up by: Golden Circle RN,  April 10, 2009 9:47 AM  Additional Follow-up for Phone Call Additional follow up Details #1::        she will be here at 3:30 for a work in. gets off work at 4. will ask her boss about leaving early Additional Follow-up by: Golden Circle RN,  April 10, 2009 10:47 AM

## 2010-04-17 NOTE — Progress Notes (Signed)
Summary: test results   Phone Note Call from Patient Call back at [phone]   Caller: Patient Summary of Call: pt is wanting to know her test results  also saw blood in her urine this morning. Initial call taken by: De Nurse,  October 02, 2008 3:01 PM  Follow-up for Phone Call        will send  message to MD. Follow-up by: Theresia Lo RN,  October 02, 2008 3:36 PM  Additional Follow-up for Phone Call Additional follow up Details #1::        Please call patient to let her know that her labs (including thyroid, cholesterol, liver, and kidney tests) were all normal. If she is still having hematuria, ask her to come in for a urine test so that we can see if she has a UTI. I have already written the order. She does not need a visit with me for this. Additional Follow-up by: Helane Rima MD,  October 02, 2008 8:04 PM     Appended Document: test results patient notified .  states she thinks her urine was just darker than usual yesterday. today does not notice any darkness to urine . advised if color returns to come in for urinalysis.

## 2010-04-17 NOTE — Assessment & Plan Note (Signed)
Summary: UTI per pt/[place]   Vital Signs:  Patient profile:   50 year old female Weight:      190 pounds Temp:     97.9 degrees F oral Pulse rate:   77 / minute BP sitting:   121 / 84  (right arm) Cuff size:   regular  Vitals Entered By: Tessie Fass, CMA (April 10, 2009 4:12 PM) CC: UTI? Is Patient Diabetic? No Pain Assessment Patient in pain? no        Primary Care Provider:  Helane Rima DO  CC:  UTI?Marland Kitchen  History of Present Illness: Ms. Callaway comes in for possible UTI.  She noticed blood in her urine last night.  That is her usual symptom of UTI.  She has not had dysuria and does not usually have dysuria.  She was seen by uro in March for the episodes of hematuria and all tests were negative.  They recommended that if she had hematuria and no infection, to possibly obtain a scan to look for small tumor that cystoscopy could miss.  However, if she has hematuria in setting of infection, it wasn't necessary.  No fever or abdominal pain.  Has used bactrim and keflex in teh past.   Habits & Providers  Alcohol-Tobacco-Diet     Tobacco Status: never  Allergies: No Known Drug Allergies  Physical Exam  General:  Well-developed,well-nourished,in no acute distress; alert,appropriate and cooperative throughout examination. vitals reviewed.   Abdomen:  soft, nontender   Impression & Recommendations:  Problem # 1:  UTI (ICD-599.0) Assessment New UA consistent with UTI.  Will obtain culture and treat with bactrim.  Her updated medication list for this problem includes:    Sulfamethoxazole-tmp Ds 800-160 Mg Tabs (Sulfamethoxazole-trimethoprim) .Marland Kitchen... 1 tab by mouth two times a day for 7 days  Orders: Urinalysis-[facility] (00000) [facility]- Est  Level 4 ([address])  Complete Medication List: 1)  Claritin 10 Mg Tabs (Loratadine) .Marland Kitchen.. 1 tab by mouth daily 2)  Multivitamins Tabs (Multiple vitamin) .Marland Kitchen.. 1 tab by mouth daily 3)  Ventolin Hfa 108 (90 Base) Mcg/act Aers (Albuterol sulfate)  .... Inhale 2 puffs q4h as needed for shortness of breath 4)  Advair Diskus 100-50 Mcg/dose Misc (Fluticasone-salmeterol) .Marland Kitchen.. 1 puff two times a day 5)  Ibuprofen 600 Mg Tabs (Ibuprofen) .... One by mouth three times a day with food 6)  Sulfamethoxazole-tmp Ds 800-160 Mg Tabs (Sulfamethoxazole-trimethoprim) .Marland Kitchen.. 1 tab by mouth two times a day for 7 days Prescriptions: SULFAMETHOXAZOLE-TMP DS 800-160 MG TABS (SULFAMETHOXAZOLE-TRIMETHOPRIM) 1 tab by mouth two times a day for 7 days  #14 x 0   Entered and Authorized by:   Ardeen Garland  MD   Signed by:   Ardeen Garland  MD on 04/10/2009   Method used:   Print then Give to Patient   RxID:   [id number]   Laboratory Results   Urine Tests  Date/Time Received: April 10, 2009 3:43 PM  Date/Time Reported: April 10, 2009 4:08 PM   Routine Urinalysis   Color: yellow Appearance: Clear Glucose: negative   (Normal Range: Negative) Bilirubin: negative   (Normal Range: Negative) Ketone: negative   (Normal Range: Negative) Spec. Gravity: 1.015   (Normal Range: 1.003-1.035) Blood: moderate   (Normal Range: Negative) pH: 6.5   (Normal Range: 5.0-8.0) Protein: negative   (Normal Range: Negative) Urobilinogen: 0.2   (Normal Range: 0-1) Nitrite: positive   (Normal Range: Negative) Leukocyte Esterace: small   (Normal Range: Negative)  Urine Microscopic WBC/HPF: 10-20 RBC/HPF: >20 Bacteria/HPF:  2+ Epithelial/HPF: 1-5    Comments: ...............test performed by......Marland KitchenBonnie A. [place], MLS (ASCP)cm     Appended Document: Orders Update     Clinical Lists Changes  Orders: Added new Test order of Urine Culture-[facility] ([id number]) - Signed

## 2010-04-17 NOTE — Progress Notes (Signed)
Summary: triage   Phone Note Call from Patient Call back at [phone]   Caller: Patient Summary of Call: Both legs are broken out and heated spreading up thighs. Initial call taken by: Clydell Hakim,  May 17, 2009 1:40 PM  Follow-up for Phone Call        started saturday night at work. very itchy. 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  red  & very itchy. will come for 2:30 appt Follow-up by: Golden Circle RN,  May 17, 2009 1:45 PM

## 2010-04-17 NOTE — Assessment & Plan Note (Signed)
Summary: Hematuria   Medications Added ULTRAM 50 MG TABS (TRAMADOL HCL) 1-2 tabs every 4 hours as needed for pain      Allergies Added: ! NSAIDS Nurse Visit   Vital Signs:  Patient profile:   50 year old female Weight:      194.5 pounds Temp:     98.5 degrees F Pulse rate:   100 / minute BP sitting:   112 / 72  (left arm)  Vitals Entered By: Theresia Lo RN (October 11, 2009 2:43 PM)  Impression & Recommendations:  Problem # 1:  HEMATURIA, HX OF (ICD-V13.09) Pt instructed to discontinue NSAID use as pt has had recurrent episodes of hematuria w/ NSAID use. WIll also obtain UA and urine cx to rule out infection. Will check BMET to assess renal function in setting of NSAID use. Red flags reviewed w/ pt at length. May consider imaging such as abd CT and re-referal to urology of hematuria recurrent in setting of bacteruria negative and non NSAID induced hematuria per urology recs. Pt agreeable.  Orders: Urinalysis-[facility] (00000) Urine Culture-[facility] ([id number]) Basic Met-[facility] (09811-91478) [facility]- Est Level  3 (29562)  Problem # 2:  ARTHRITIS (ICD-716.90) Pt instructed to hold on NSAIDs for arthritic pain. Given Rx for ultram. Also instrcuted to use as needed tylenol for pain. Pt agreeable.   Complete Medication List: 1)  Claritin 10 Mg Tabs (Loratadine) .Marland Kitchen.. 1 tab by mouth daily 2)  Multivitamins Tabs (Multiple vitamin) .Marland Kitchen.. 1 tab by mouth daily 3)  Ventolin Hfa 108 (90 Base) Mcg/act Aers (Albuterol sulfate) .... Inhale 2 puffs q4h as needed for shortness of breath 4)  Triamcinolone Acetonide 0.5 % Crea (Triamcinolone acetonide) .... Apply to affected area two times a day. 5)  Meloxicam 15 Mg Tabs (Meloxicam) .... One by mouth daily 6)  Estrace 0.1 Mg/gm Crea (Estradiol) .Marland Kitchen.. 1 g pv 1 time per week x 6 weeks 7)  Cephalexin 500 Mg Caps (Cephalexin) .... Take 1 tab two times a day for 7 days 8)  Ultram 50 Mg Tabs (Tramadol hcl) .Marland Kitchen.. 1-2 tabs every 4 hours as needed for pain    Visit  Type:  Acute care  Chief Complaint:  blood in urine.  History of Present Illness: 88 YOF w/ PMHx/o hematuria here for evaluation for recurrence of hematuria. Pt reports noticing blood tinged urine since yesterday. Had taken meloxicam for shoulder pain. Pt reports hematuria in past when taking NSAIDs. No fever, abd pain, dysuria, flank pain, N/V/D/C. No change in sexual activity. No chnage in menstrual cycle s/p hysterectomy several years ago. Pt recently treated fro UTI, being found to have subclinical E. Coli UTi-treated w/ keflex x7 days. Previously seen at  urology w/ essentially negative workup. Recommended to start prophylactic abx-however pt declined(see urology report for full details).    Physical Exam  General:  alert, well-nourished, and well-hydrated.   Neck:  supple and full ROM.   Lungs:  normal respiratory effort.   Heart:  normal rate, regular rhythm, and no murmur.   Abdomen:  soft, non-tender, and normal bowel sounds.   No tenderness to palpation in lower abdomen Extremities:  no edema Neurologic:  alert & oriented X3.    CC: blood in urine Is Patient Diabetic? No Pain Assessment Patient in pain? no        Habits & Providers  Alcohol-Tobacco-Diet     Tobacco Status: never  Patient Instructions: 1)  It was good to meet you today 2)  We will check your urine  for blood and for infection 3)  We will also check to make sure that your kidney function is also good 4)  If you have any burning w/ urination; back pain, fever, nausea, vomiting or other symptoms, please return to the [facility] for reevaluation 5)  I recommend that you no longer take medications such as meloxicam, ibuprofen, or naproxen for pain. 6)  Try using Tylenol instead and I will also write you a prescription for ultram.  7)  If you have any other questions, please feel free to give [place] a call.  8)  Congratulations on your wedding anniversary! 9)  God Bless 10)  Doree Albee MD   Allergies  (verified): 1)  ! Nsaids Laboratory Results   Urine Tests  Date/Time Received: October 11, 2009 2:58 PM  Date/Time Reported: October 11, 2009 3:29 PM   Routine Urinalysis   Color: red Appearance: Cloudy Glucose: negative   (Normal Range: Negative) Bilirubin: negative   (Normal Range: Negative) Ketone: negative   (Normal Range: Negative) Spec. Gravity: 1.015   (Normal Range: 1.003-1.035) Blood: large   (Normal Range: Negative) pH: 6.0   (Normal Range: 5.0-8.0) Protein: 30   (Normal Range: Negative) Urobilinogen: 0.2   (Normal Range: 0-1) Nitrite: negative   (Normal Range: Negative) Leukocyte Esterace: small   (Normal Range: Negative)  Urine Microscopic WBC/HPF: 5-10 RBC/HPF: TNTC Bacteria/HPF: 3+ Epithelial/HPF: rare    Comments: ...........test performed by...........Marland KitchenTerese Door, CMA     Orders Added: 1)  Urinalysis-[facility] [00000] 2)  Urine Culture-[facility] [[id number]] 3)  Basic Met-[facility] [[id number]] 4)  [facility]- Est Level  3 [99213] Prescriptions: ULTRAM 50 MG TABS (TRAMADOL HCL) 1-2 tabs every 4 hours as needed for pain  #100 x 0   Entered and Authorized by:   Doree Albee MD   Signed by:   Doree Albee MD on 10/11/2009   Method used:   Print then Give to Patient   RxID:   [id number]    Vital Signs:  Patient profile:   50 year old female Weight:      194.5 pounds Temp:     98.5 degrees F Pulse rate:   100 / minute BP sitting:   112 / 72  (left arm)  Vitals Entered By: Theresia Lo RN (October 11, 2009 2:43 PM)

## 2010-04-17 NOTE — Progress Notes (Signed)
Summary: triage   Phone Note Call from Patient Call back at Home Phone [phone]   Caller: Patient Summary of Call: Has blood in her urine. Initial call taken by: Clydell Hakim,  September 20, 2009 1:38 PM  Follow-up for Phone Call        states this has happened "several times in the last year" no pain. denies symptoms of UTI. has had a hysterectomy. newly on meloxican.  she thinks it is from this drug. told her I will check with her md & call her back Follow-up by: Golden Circle RN,  September 20, 2009 2:34 PM  Additional Follow-up for Phone Call Additional follow up Details #1::        please have patient come in for evaluation. Additional Follow-up by: Helane Rima DO,  September 22, 2009 6:46 PM    Additional Follow-up for Phone Call Additional follow up Details #2::    pt will come in now & see work in md Follow-up by: Golden Circle RN,  September 23, 2009 9:43 AM

## 2010-04-17 NOTE — Consult Note (Signed)
Summary: [facility]  [facility]   Imported By: Knox Royalty 10/05/2009 12:34:36  _____________________________________________________________________  External Attachment:    Type:   Image     Comment:   External Document

## 2010-04-17 NOTE — Assessment & Plan Note (Signed)
Summary: Work-in: hematuria   Vital Signs:  Patient profile:   50 year old female Weight:      190 pounds Temp:     97.5 degrees F oral Pulse rate:   72 / minute BP sitting:   114 / 81  (left arm) Cuff size:   large  Vitals Entered By: Tessie Fass CMA (January 10, 2010 3:49 PM)   Primary Provider:  Helane Rima DO   History of Present Illness: 1. CC: hematuria Started this afternoon. Has h/o UTIs that present this way.  Denies dysuria/frequency/urgency/flank pain/fever/chills. Denies trauma to vaginal area.  Sexually active with husband only. No recent intercourse.  Patient says she drinks adequate fluids (at least 8 glasses a day). Pt has had hysterectomy about 5 years ago and recent Pap about 2 years ago. No h/o abnormal Paps.  2. Also c/o vaginal itching/irritation.  Concern for yeast infection. Denies discharge. Has not tried anything.    Allergies: 1)  ! Nsaids  Physical Exam  Neck:  no cervical lymphadenopathy Lungs:  CTAB, good effort and aeration, no w/r/r/\par Heart:  RRR Abdomen:  NABS, soft, NT Msk:  No back or flank tenderness Extremities:  No pedal/LE edema B/L   Impression & Recommendations:  Problem # 1:  GROSS HEMATURIA (ICD-599.71) From UTI. Multiple E. coli UTIs in past, and this seems to be similar episode. Will not get urine culture at this time. Advised to call clinic if symptoms don't improve 24-48 hrs, if fevers or back/flank pain.  Patient still denies wanting prophylactic antibiotic; would rather come in and be treated when symptomatic.  Work-up for patient for bladder/renal pathology normal.    Her updated medication list for this problem includes:    Keflex 500 Mg Caps (Cephalexin) .Marland Kitchen... Take 1 tablet daily for 7 days.  Her updated medication list for this problem includes:    Keflex 500 Mg Caps (Cephalexin) .Marland Kitchen... Take 1 tablet daily for 7 days.  Problem # 2:  UNSPECIFIED VAGINITIS AND VULVOVAGINITIS (ICD-616.10) Will give  Diflucan and yeast cream, especially since patient will be on antibiotic.   Her updated medication list for this problem includes:    Keflex 500 Mg Caps (Cephalexin) .Marland Kitchen... Take 1 tablet daily for 7 days.    Terconazole 0.4 % Crea (Terconazole) .Marland Kitchen... Apply daily as needed for yeast.  Complete Medication List: 1)  Claritin 10 Mg Tabs (Loratadine) .Marland Kitchen.. 1 tab by mouth daily 2)  Multivitamins Tabs (Multiple vitamin) .Marland Kitchen.. 1 tab by mouth daily 3)  Ventolin Hfa 108 (90 Base) Mcg/act Aers (Albuterol sulfate) .... Inhale 2 puffs q4h as needed for shortness of breath 4)  Triamcinolone Acetonide 0.5 % Crea (Triamcinolone acetonide) .... Apply to affected area two times a day. 5)  Meloxicam 15 Mg Tabs (Meloxicam) .... One by mouth daily 6)  Estrace 0.1 Mg/gm Crea (Estradiol) .Marland Kitchen.. 1 g pv 1 time per week x 6 weeks 7)  Keflex 500 Mg Caps (Cephalexin) .... Take 1 tablet daily for 7 days. 8)  Fluconazole 150 Mg Tabs (Fluconazole) .... Take 1 tablet. 9)  Terconazole 0.4 % Crea (Terconazole) .... Apply daily as needed for yeast.  Other Orders: Urinalysis-[facility] (00000)  Patient Instructions: 1)  Take the antibioitic Keflex for 7 days for your urine infection 2)  For your possible yeast infection, take the pill one time and use the vaginal cream as needed Prescriptions: TERCONAZOLE 0.4 % CREA (TERCONAZOLE) Apply daily as needed for yeast.  #1 x 0   Entered and Authorized  by:   Priscella Mann MD   Signed by:   Lucianne Muss Park MD on 01/10/2010   Method used:   Print then Give to Patient   RxID:   [id number] FLUCONAZOLE 150 MG TABS (FLUCONAZOLE) Take 1 tablet.  #1 x 0   Entered and Authorized by:   Priscella Mann MD   Signed by:   Lucianne Muss Park MD on 01/10/2010   Method used:   Print then Give to Patient   RxID:   [id number] KEFLEX 500 MG CAPS (CEPHALEXIN) Take 1 tablet daily for 7 days.  #7 x 0   Entered and Authorized by:   Priscella Mann MD   Signed by:   Lucianne Muss Park MD on 01/10/2010    Method used:   Print then Give to Patient   RxID:   [id number]    Orders Added: 1)  Urinalysis-[facility] [00000]    Laboratory Results   Urine Tests  Date/Time Received: January 10, 2010 3:53  PM  Date/Time Reported: January 10, 2010 4:39 PM   Routine Urinalysis   Color: yellow Appearance: Cloudy Glucose: negative   (Normal Range: Negative) Bilirubin: negative   (Normal Range: Negative) Ketone: negative   (Normal Range: Negative) Spec. Gravity: 1.020   (Normal Range: 1.003-1.035) Blood: large   (Normal Range: Negative) pH: 7.0   (Normal Range: 5.0-8.0) Protein: negative   (Normal Range: Negative) Urobilinogen: 0.2   (Normal Range: 0-1) Nitrite: negative   (Normal Range: Negative) Leukocyte Esterace: moderate   (Normal Range: Negative)  Urine Microscopic WBC/HPF: 10-20 RBC/HPF: loaded Bacteria/HPF: 1+ Epithelial/HPF: rare    Comments: ...............test performed by......Marland KitchenBonnie A. [place], MLS (ASCP)cm     Appended Document: Work-in: hematuria

## 2010-04-17 NOTE — Progress Notes (Signed)
Summary: triage   Phone Note Call from Patient Call back at Home Phone [phone]   Caller: Patient Summary of Call: Pt has blood in her urine. Initial call taken by: Clydell Hakim,  January 10, 2010 2:52 PM  Follow-up for Phone Call        h/o UTIs which always start out as hematuria.  Told her to come in now so we could get a UA.  Pt is on her way. Follow-up by: Dennison Nancy RN,  January 10, 2010 3:18 PM

## 2010-04-17 NOTE — Progress Notes (Signed)
Summary: triage   Phone Note Call from Patient Call back at Home Phone [phone]   Caller: Patient Summary of Call: wanted to talk to a nurse about some medication she is taking and the side effects it could be causing her. Initial call taken by: Clydell Hakim,  January 03, 2009 10:16 AM  Follow-up for Phone Call        hurt back sunday & went to Urgent Care. now taking muscle relaxer & naproxyen. yesterday saw blood in her urine, but it cleared by end of day. back again today after taking another dose. she does have a hx of utis per pt. appt at 3:30 today with Dr. Janalyn Harder. to increase fluid intake. denies pain Follow-up by: Golden Circle RN,  January 03, 2009 10:20 AM

## 2010-04-17 NOTE — Assessment & Plan Note (Signed)
Summary: blood in urine/wallace/[facility]   Vital Signs:  Patient profile:   50 year old female Weight:      183.6 pounds Temp:     98.6 degrees F oral Pulse rate:   75 / minute BP sitting:   158 / 86  (right arm) Cuff size:   regular  Vitals Entered By: Jimmy Footman, CMA (March 31, 2010 9:36 AM) CC: blood in urine x2 days Is Patient Diabetic? No Pain Assessment Patient in pain? no        Primary Care Provider:  Helane Rima DO  CC:  blood in urine x2 days.  History of Present Illness: UTI? PT has had blood in her urine the last 2 days. This is the way her UTI's usually start. She has not had any increase in frequency, no pain, no vaginal itching no fevers or chills, no CVA tenderness.   Habits & Providers  Alcohol-Tobacco-Diet     Tobacco Status: never  Current Medications (verified): 1)  Claritin 10 Mg  Tabs (Loratadine) .Marland Kitchen.. 1 Tab By Mouth Daily 2)  Multivitamins   Tabs (Multiple Vitamin) .Marland Kitchen.. 1 Tab By Mouth Daily 3)  Ventolin Hfa 108 (90 Base) Mcg/act Aers (Albuterol Sulfate) .... Inhale 2 Puffs Q4h As Needed For Shortness of Breath 4)  Triamcinolone Acetonide 0.5 % Crea (Triamcinolone Acetonide) .... Apply To Affected Area Two Times A Day. 5)  Meloxicam 15 Mg Tabs (Meloxicam) .... One By Mouth Daily 6)  Keflex 500 Mg Caps (Cephalexin) .... Take 1 Pill Every 12 Hours For 7 Days 7)  Fluconazole 150 Mg Tabs (Fluconazole) .... Take 1 Tablet If You Have Sypmtoms of Yeast Infection  Allergies (verified): 1)  ! Nsaids  Review of Systems        vitals reviewed and pertinent negatives and positives seen in HPI   Physical Exam  General:  Well-developed,well-nourished,in no acute distress; alert,appropriate and cooperative throughout examination Ears:  External ear exam shows no significant lesions or deformities.  Otoscopic examination reveals clear canals, tympanic membranes are intact bilaterally without bulging, retraction, inflammation or discharge. Hearing is grossly  normal bilaterally. Nose:  External nasal examination shows no deformity or inflammation. Nasal mucosa are pink and moist without lesions or exudates. Abdomen:  slight tenderness over the lower abdomen with palpation.  Cervical Nodes:  Rt posterior cervical node is slightly enlarged and feels rubbery.    Impression & Recommendations:  Problem # 1:  UTI (ICD-599.0) Assessment New Pt found tohave UTI on UA. Will plan to treat with Keflex. She usually gets a yeast infection when treated with Abx so i have sent in a Rx for diflucan.   Her updated medication list for this problem includes:    Keflex 500 Mg Caps (Cephalexin) .Marland Kitchen... Take 1 pill every 12 hours for 7 days  Orders: [facility]- Est Level  3 ([address])  Problem # 2:  HEMATURIA UNSPECIFIED (ICD-599.70) Assessment: Comment Only Related to above.   Her updated medication list for this problem includes:    Keflex 500 Mg Caps (Cephalexin) .Marland Kitchen... Take 1 pill every 12 hours for 7 days  Orders: Urinalysis-[facility] (00000) Urine Culture-[facility] ([id number]) [facility]- Est Level  3 (91478)  Complete Medication List: 1)  Claritin 10 Mg Tabs (Loratadine) .Marland Kitchen.. 1 tab by mouth daily 2)  Multivitamins Tabs (Multiple vitamin) .Marland Kitchen.. 1 tab by mouth daily 3)  Ventolin Hfa 108 (90 Base) Mcg/act Aers (Albuterol sulfate) .... Inhale 2 puffs q4h as needed for shortness of breath 4)  Triamcinolone Acetonide 0.5 %  Crea (Triamcinolone acetonide) .... Apply to affected area two times a day. 5)  Meloxicam 15 Mg Tabs (Meloxicam) .... One by mouth daily 6)  Keflex 500 Mg Caps (Cephalexin) .... Take 1 pill every 12 hours for 7 days 7)  Fluconazole 150 Mg Tabs (Fluconazole) .... Take 1 tablet if you have sypmtoms of yeast infection  Patient Instructions: 1)  Please follow up with your PCP for your BP.  2)  You have a UTI. I have sent in meds (Keflex and Diflucan) to treat your UTI and a yeast infection if you get one.  Prescriptions: FLUCONAZOLE 150 MG TABS (FLUCONAZOLE) Take 1  tablet if you have sypmtoms of yeast infection  #1 x 0   Entered and Authorized by:   Jamie Brookes MD   Signed by:   Jamie Brookes MD on 03/31/2010   Method used:   Electronically to        [id number] (retail)       [address] [place], [place]  [address]       Ph: [phone]       Fax: [phone]   RxID:   [id number] KEFLEX 500 MG CAPS (CEPHALEXIN) take 1 pill every 12 hours for 7 days  #14 x 0   Entered and Authorized by:   Jamie Brookes MD   Signed by:   Jamie Brookes MD on 03/31/2010   Method used:   Electronically to        [id number] (retail)       [address] [place], [place]  [address]       Ph: [phone]       Fax: [phone]   RxID:   [id number]    Orders Added: 1)  Urinalysis-[facility] [00000] 2)  Urine Culture-[facility] [[id number]] 3)  [facility]- Est Level  3 [[address]]    Laboratory Results   Urine Tests  Date/Time Received: March 31, 2010 9:45 AM  Date/Time Reported: March 31, 2010 10:03 AM   Routine Urinalysis   Color: yellow Appearance: Clear Glucose: negative   (Normal Range: Negative) Bilirubin: negative   (Normal Range: Negative) Ketone: negative   (Normal Range: Negative) Spec. Gravity: 1.015   (Normal Range: 1.003-1.035) Blood: moderate   (Normal Range: Negative) pH: 6.5   (Normal Range: 5.0-8.0) Protein: negative   (Normal Range: Negative) Urobilinogen: 0.2   (Normal Range: 0-1) Nitrite: negative   (Normal Range: Negative) Leukocyte Esterace: moderate   (Normal Range: Negative)  Urine Microscopic WBC/HPF: >20 RBC/HPF: 15-25 Bacteria/HPF: 2+ Epithelial/HPF: 1-5    Comments: urine sent for culture ...............test performed by......Marland KitchenBonnie A. [place], MLS (ASCP)cm

## 2010-04-17 NOTE — Assessment & Plan Note (Signed)
Summary: BLOOD IN urine/Anna Berry/Anna Berry   Vital Signs:  Patient profile:   50 year old female Weight:      193.4 pounds Temp:     98.1 degrees F oral Pulse rate:   74 / minute Pulse rhythm:   regular BP sitting:   146 / 78  (left arm) Cuff size:   large  Vitals Entered By: Loralee Pacas CMA (September 23, 2009 10:43 AM) CC: blood in urine Comments pt stated that she notice the blood on friday and sunday.   Primary Care Provider:  Helane Rima DO  CC:  blood in urine.  History of Present Illness: 1. Hematuria - Present on Friday and Saturday but nothing since then - Denies dysuria, urinary frequency or urgency, abdominal pain - Was taking Meloxicam for knee pain - Has had this before and seen by Urology in 05/2008 where they did a cystoscope and it was normal - Prior hematuria was associated with taking Naproxen  ROS: denies fevers, flank pain, vaginal bleeding or discharge  PSurgHx: Hysterectomy  Allergies: No Known Drug Allergies  Social History: Reviewed history from 05/13/2006 and no changes required. Lives in [place] with husband.  Daughter attends [employer] in [place].  No ETOH, tob, drugs.  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  Physical Exam  General:  Vitals reviewed.  No acute distress. Lungs:  Normal respiratory effort, chest expands symmetrically. Lungs are clear to auscultation, no crackles or wheezes. Heart:  Normal rate and regular rhythm. S1 and S2 normal without gallop, murmur, click, rub or other extra sounds. Abdomen:  Bowel sounds positive,abdomen soft and non-tender without masses, organomegaly or hernias noted. Extremities:  No ankle edema. Skin:  Intact without suspicious lesions or rashes. Additional Exam:  Reviewed note from [facility]:  no signs of intravesical lesions or cancer   Impression & Recommendations:  Problem # 1:  HEMATURIA UNSPECIFIED (ICD-599.70) Assessment New Discussed with Dr. [place].  Will see if there is a  UTI and treat if culture grows something since she is currently asymptomatic and the hematuria has resolved.  Will send her back to Urology since it was over 1 year ago for reevaluation. Orders: Urinalysis-[facility] (00000) Urine Culture-[facility] ([id number]) Urology Referral (Urology) [facility]- Est Level  3 (86578)  Complete Medication List: 1)  Claritin 10 Mg Tabs (Loratadine) .Marland Kitchen.. 1 tab by mouth daily 2)  Multivitamins Tabs (Multiple vitamin) .Marland Kitchen.. 1 tab by mouth daily 3)  Ventolin Hfa 108 (90 Base) Mcg/act Aers (Albuterol sulfate) .... Inhale 2 puffs q4h as needed for shortness of breath 4)  Triamcinolone Acetonide 0.5 % Crea (Triamcinolone acetonide) .... Apply to affected area two times a day. 5)  Meloxicam 15 Mg Tabs (Meloxicam) .... One by mouth daily 6)  Estrace 0.1 Mg/gm Crea (Estradiol) .Marland Kitchen.. 1 g pv 1 time per week x 6 weeks  Patient Instructions: 1)  We will see if any bacterial infection is in the urine.  If it is we will treat you for that 2)  I would like for you to go back to Urology just so that they know that you are still having blood in your urine and to see if that would like to run any more tests.  Laboratory Results   Urine Tests  Date/Time Received: September 23, 2009 10:40 AM  Date/Time Reported: September 23, 2009 11:15 AM   Routine Urinalysis   Color: yellow Appearance: Clear Glucose: negative   (Normal Range: Negative) Bilirubin: negative   (Normal Range: Negative) Ketone:  negative   (Normal Range: Negative) Spec. Gravity: 1.020   (Normal Range: 1.003-1.035) Blood: trace-intact   (Normal Range: Negative) pH: 7.5   (Normal Range: 5.0-8.0) Protein: negative   (Normal Range: Negative) Urobilinogen: 0.2   (Normal Range: 0-1) Nitrite: negative   (Normal Range: Negative) Leukocyte Esterace: moderate   (Normal Range: Negative)  Urine Microscopic WBC/HPF: 10-20 RBC/HPF: occ Bacteria/HPF: 1+ Epithelial/HPF: 1-5    Comments: urine sent for culture ...........test performed  by...........Marland KitchenTerese Door, CMA

## 2010-04-17 NOTE — Assessment & Plan Note (Signed)
Summary: feet pain,df   Vital Signs:  Patient profile:   50 year old female Weight:      189 pounds Pulse rate:   71 / minute BP sitting:   126 / 80  (right arm)  Vitals Entered By: Arlyss Repress CMA, (September 27, 2008 8:31 AM) CC: swollen feet x ? months. worse over the last weeks. 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 pain worse in pm. 7/10. Is Patient Diabetic? No Pain Assessment Patient in pain? no        Primary Care Provider:  Helane Rima DO  CC:  swollen feet x ? months. worse over the last weeks. 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 pain worse in pm. 7/10.Marland Kitchen  History of Present Illness: Anna Berry is a very pleasant 50 year old female presenting with c/o bilateral foot pain and swelling.  1. 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 Pain is worst with first step in am.  2. Foot swelling: x several weeks, non-pitting, not painful, goes down after feet up.  She denies CP, SOB, cough, HA, dizziness, fever/chills, oliguria, dysuria, abdominal pain, N/V/D, leg swelling. No PMHx heart disease.  Habits & Providers  Alcohol-Tobacco-Diet     Tobacco Status: never  Current Medications (verified): 1)  Claritin 10 Mg  Tabs (Loratadine) .Marland Kitchen.. 1 Tab By Mouth Daily 2)  Multivitamins   Tabs (Multiple Vitamin) .Marland Kitchen.. 1 Tab By Mouth Daily 3)  Bactrim Ds 800-160 Mg Tabs (Sulfamethoxazole-Trimethoprim) .Marland Kitchen.. 1 Tablet By Mouth Two Times A Day For 7 Days 4)  Ventolin Hfa 108 (90 Base) Mcg/act Aers (Albuterol Sulfate) .... Inhale 2 Puffs Q4h As Needed For Shortness of Breath 5)  Advair Diskus 100-50 Mcg/dose Misc (Fluticasone-Salmeterol) .Marland Kitchen.. 1 Puff Two Times A Day 6)  Ibuprofen 600 Mg  Tabs (Ibuprofen) .... One By Mouth Three Times A Day With Food  Allergies (verified): No Known Drug Allergies PMH-FH-SH reviewed-no changes except otherwise  noted  Review of Systems       per HPI, otherwise negative  Physical Exam  General:  NAD, pleasant, alert, vital signs reviewed. Lungs:  Normal respiratory effort, chest expands symmetrically. Lungs are clear to auscultation, no crackles or wheezes. Heart:  Normal rate and regular rhythm. S1 and S2 normal without gallop, murmur, click, rub or other extra sounds. Abdomen:  Bowel sounds positive,abdomen soft and non-tender without masses, organomegaly or hernias noted. Extremities:  no edema. both heels tender to palpation. no obvious deformities, warts, or callous. 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 Rec: Ibuprofen 600 mg three times a day with food, demonstrated and gave handout of exercises to stretch plantar fascia, gave note for work ordering pateint to wear tennis shoes. Educated patient re: diagnosis, prognosis, long-term nature of reversing this issue. Weight loss will be key. Follow up in 1-2 months to reevaluate. Her updated medication list for this problem includes:    Ibuprofen 600 Mg Tabs (Ibuprofen) ..... One by mouth three times a day with food  Orders: [facility]- Est Level  3 (04540)  Problem # 2:  LEG EDEMA, BILATERAL (ICD-782.3) Assessment: New No edema today. Likely dependent edema. Will check urine for protein, creatinine. Orders: Lipid-[facility] ([id number]) UA Glucose/Protein-[facility] (81002) Comp  Met-[facility] [phone]) [facility]- Est Level  3 (09811)  Complete Medication List: 1)  Claritin 10 Mg Tabs (Loratadine) .Marland Kitchen.. 1 tab by mouth daily 2)  Multivitamins Tabs (Multiple vitamin) .Marland Kitchen.. 1 tab by mouth daily 3)  Bactrim Ds 800-160 Mg Tabs (Sulfamethoxazole-trimethoprim) .Marland Kitchen.. 1 tablet by mouth two times a day for 7 days 4)  Ventolin Hfa 108 (90 Base) Mcg/act Aers (Albuterol sulfate) .... Inhale 2 puffs q4h as needed for  shortness of breath 5)  Advair Diskus 100-50 Mcg/dose Misc (Fluticasone-salmeterol) .Marland Kitchen.. 1 puff two times a day 6)  Ibuprofen 600 Mg Tabs (Ibuprofen) .... One by mouth three times a day with food  Other Orders: TSH-[facility] ([id number])  Patient Instructions: 1)  It was great to meet you! 2)  For your foot pain:  3)  1. Wear better shoes! 4)  2. Do the stretching nd exercises that we talked about. 5)  3. Ibuprofen 600 three times a day. Remember to take this with food. You may take OTC Zantac if you develop heartburn. 6)  Let me know if you pain continues or worsens after a few weeks. Prescriptions: IBUPROFEN 600 MG  TABS (IBUPROFEN) one by mouth three times a day WITH FOOD  #30 x 3   Entered and Authorized by:   Helane Rima MD   Signed by:   Helane Rima MD on 09/27/2008   Method used:   Print then Give to Patient   RxID:   [id number]   Laboratory Results   Urine Tests  Date/Time Received: September 27, 2008 9:21 AM  Date/Time Reported: September 27, 2008 9:51 AM   Routine Urinalysis   Glucose: negative   (Normal Range: Negative) Protein: negative   (Normal Range: Negative)    Comments: ...........test performed by...........Marland KitchenTerese Door, CMA

## 2010-04-17 NOTE — Assessment & Plan Note (Signed)
Summary: cpe,df   Vital Signs:  Patient profile:   50 year old female Weight:      192 pounds Temp:     98.7 degrees F oral Pulse rate:   86 / minute BP sitting:   122 / 79  (left arm) Cuff size:   regular  Vitals Entered By: Tessie Fass CMA (August 26, 2009 3:16 PM) CC: complete physical Is Patient Diabetic? No Pain Assessment Patient in pain? no        Primary Care Provider:  Helane Rima DO  CC:  complete physical.  History of Present Illness: 50 year old AAF:   1. Postmenopausal Symptoms: Hot flashes x several months, s/p hysterectomy 2004, still has ovaries, has vaginal dryness and pain with intercourse. Has not had mammogram in > 2 years. Has female cousin with newly diagnosed breast cancer - same age as patient.  2. Asthma: Needs refill. Rarely uses Albuterol. Takes "allergy med" q pm. Not taking Advair.    Habits & Providers  Alcohol-Tobacco-Diet     Tobacco Status: never  Current Medications (verified): 1)  Claritin 10 Mg  Tabs (Loratadine) .Marland Kitchen.. 1 Tab By Mouth Daily 2)  Multivitamins   Tabs (Multiple Vitamin) .Marland Kitchen.. 1 Tab By Mouth Daily 3)  Ventolin Hfa 108 (90 Base) Mcg/act Aers (Albuterol Sulfate) .... Inhale 2 Puffs Q4h As Needed For Shortness of Breath 4)  Advair Diskus 100-50 Mcg/dose Misc (Fluticasone-Salmeterol) .Marland Kitchen.. 1 Puff Two Times A Day 5)  Ibuprofen 600 Mg  Tabs (Ibuprofen) .... One By Mouth Three Times A Day With Food 6)  Sulfamethoxazole-Tmp Ds 800-160 Mg Tabs (Sulfamethoxazole-Trimethoprim) .Marland Kitchen.. 1 Tab By Mouth Two Times A Day For 7 Days 7)  Triamcinolone Acetonide 0.5 % Crea (Triamcinolone Acetonide) .... Apply To Affected Area Two Times A Day.  Allergies (verified): No Known Drug Allergies PMH-FH-SH reviewed for relevance  Review of Systems General:  Denies chills, fever, and weakness. CV:  Denies difficulty breathing at night, palpitations, shortness of breath with exertion, and swelling of feet. Resp:  Denies cough, shortness of breath,  and wheezing. GI:  Denies change in bowel habits. GU:  Denies abnormal vaginal bleeding, discharge, dysuria, genital sores, and incontinence. MS:  Complains of joint pain and joint swelling; denies joint redness. Derm:  Denies rash. Neuro:  Denies numbness and tingling. Psych:  Denies anxiety, depression, suicidal thoughts/plans, and thoughts /plans of harming others. Allergy:  Complains of seasonal allergies.  Physical Exam  General:  VSS. Well-developed, well-nourished, in no acute distress; alert, appropriate and cooperative throughout examination. Head:  Normocephalic and atraumatic without obvious abnormalities.  Eyes:  No corneal or conjunctival inflammation noted. EOMI. Perrla. Vision grossly normal. Ears:  R ear normal and L ear normal.   Nose:  External nasal examination shows no deformity or inflammation. Nasal mucosa are pink and moist without lesions or exudates. Mouth:  Oral mucosa and oropharynx without lesions or exudates.  Neck:  No deformities, masses, or tenderness noted. Lungs:  Normal respiratory effort, chest expands symmetrically. Lungs are clear to auscultation, no crackles or wheezes. Heart:  Normal rate and regular rhythm. S1 and S2 normal without gallop, murmur, click, rub or other extra sounds. Abdomen:  Bowel sounds positive,abdomen soft and non-tender without masses, organomegaly or hernias noted. Msk:  Knees: FROM. No edema, redness, or warmth. + crepitus. Normal endpoints. Neck: FROM. No ttp along cervical spine. Decreased lordosis. Pulses:  2+ DP. Extremities:  No ankle edema. Skin:  Intact without suspicious lesions or rashes. Psych:  Oriented  X3, memory intact for recent and remote, normally interactive, good eye contact, not anxious appearing, and not depressed appearing.     Impression & Recommendations:  Problem # 1:  Preventive Health Care (ICD-V70.0) Assessment Unchanged  Problem # 2:  POSTMENOPAUSAL SYNDROME (ICD-627.9) Assessment:  New Discussed risks and benefits of topical estrogen for vaginal dryness. Patient requests trial. Rec Mammogram. Follow up in 1 month. Her updated medication list for this problem includes:    Estrace 0.1 Mg/gm Crea (Estradiol) .Marland Kitchen... 1 g pv 1 time per week x 6 weeks  Problem # 3:  ASTHMA (ICD-493.90) Assessment: Unchanged Refilled medication. The following medications were removed from the medication list:    Advair Diskus 100-50 Mcg/dose Misc (Fluticasone-salmeterol) .Marland Kitchen... 1 puff two times a day Her updated medication list for this problem includes:    Ventolin Hfa 108 (90 Base) Mcg/act Aers (Albuterol sulfate) ..... Inhale 2 puffs q4h as needed for shortness of breath  Problem # 4:  ARTHRITIS (ICD-716.90) Assessment: New Generalized joint pain, mostly in knees. No edema, redness, or warmth to indicate infection. No acute trauma. No morning stiffness. Will Rx Mobic for generalized arthritis. Follow up in one month for re-evaluation.  Complete Medication List: 1)  Claritin 10 Mg Tabs (Loratadine) .Marland Kitchen.. 1 tab by mouth daily 2)  Multivitamins Tabs (Multiple vitamin) .Marland Kitchen.. 1 tab by mouth daily 3)  Ventolin Hfa 108 (90 Base) Mcg/act Aers (Albuterol sulfate) .... Inhale 2 puffs q4h as needed for shortness of breath 4)  Triamcinolone Acetonide 0.5 % Crea (Triamcinolone acetonide) .... Apply to affected area two times a day. 5)  Meloxicam 15 Mg Tabs (Meloxicam) .... One by mouth daily 6)  Estrace 0.1 Mg/gm Crea (Estradiol) .Marland Kitchen.. 1 g pv 1 time per week x 6 weeks  Patient Instructions: 1)  It was nice to see you today! Follow up in one to two months to monitor your progress. 2)  I am prescribing an estrogen cream for your vaginal dryness. 3)  I am prescribing Mobic for your pain. 4)  Schedule your mammogram.  5)  Schedule a colonoscopy/ sigmoidoscopy to help detect colon cancer.  Prescriptions: ESTRACE 0.1 MG/GM CREA (ESTRADIOL) 1 g PV 1 time per week x 6 weeks  #1 qs x 3   Entered and  Authorized by:   Helane Rima DO   Signed by:   Helane Rima DO on 08/26/2009   Method used:   Print then Give to Patient   RxID:   [id number] MELOXICAM 15 MG TABS (MELOXICAM) one by mouth daily  #90 x 3   Entered and Authorized by:   Helane Rima DO   Signed by:   Helane Rima DO on 08/26/2009   Method used:   Print then Give to Patient   RxID:   [id number]   Prevention & Chronic Care Immunizations   Influenza vaccine: Not documented   Influenza vaccine deferral: Not indicated  (08/26/2009)    Tetanus booster: 02/18/2005: Done.   Tetanus booster due: 02/19/2015    Pneumococcal vaccine: Not documented  Colorectal Screening   Hemoccult: Not documented   Hemoccult action/deferral: Not indicated  (08/26/2009)    Colonoscopy: Not documented  Other Screening   Pap smear: hysterectomy  (05/14/2008)   Pap smear due: Not Indicated    Mammogram: No specific mammographic evidence of malignancy.  [facility]: [facility].     (04/01/2005)   Mammogram due: 04/2006   Smoking status: never  (08/26/2009)  Lipids   Total Cholesterol: 155  (  09/27/2008)   LDL: 88  (09/27/2008)   LDL Direct: Not documented   HDL: 46  (09/27/2008)   Triglycerides: 104  (09/27/2008)  Appended Document: Orders Update     Clinical Lists Changes  Orders: Added new Test order of [facility] - Est  40-64 yrs [phone]) - Signed      Appended Document: Orders Update     Clinical Lists Changes  Orders: Added new Test order of [facility] - Est  40-64 yrs [phone]) - Signed      Appended Document: Orders Update     Clinical Lists Changes  Orders: Added new Test order of [facility] - Est  40-64 yrs [phone]) - Signed

## 2010-05-15 DIAGNOSIS — R310 Gross hematuria: Secondary | ICD-10-CM

## 2010-05-15 HISTORY — DX: Gross hematuria: R31.0

## 2010-05-26 ENCOUNTER — Telehealth: Payer: Self-pay | Admitting: Family Medicine

## 2010-05-26 ENCOUNTER — Other Ambulatory Visit (HOSPITAL_COMMUNITY)
Admission: RE | Admit: 2010-05-26 | Discharge: 2010-05-26 | Disposition: A | Discharge status: Home / Self Care | Payer: 59 | Source: Ambulatory Visit | Attending: Sports Medicine | Admitting: Sports Medicine

## 2010-05-26 ENCOUNTER — Ambulatory Visit (INDEPENDENT_AMBULATORY_CARE_PROVIDER_SITE_OTHER): Payer: Self-pay | Admitting: Sports Medicine

## 2010-05-26 VITALS — BP 122/90 | HR 60 | Temp 97.4°F | Ht 66.25 in | Wt 185.0 lb

## 2010-05-26 DIAGNOSIS — R319 Hematuria, unspecified: Secondary | ICD-10-CM | POA: Insufficient documentation

## 2010-05-26 LAB — CONVERTED CEMR LAB
ALT: 17 units/L (ref 0–35)
AST: 18 units/L (ref 0–37)
Alkaline Phosphatase: 79 units/L (ref 39–117)
Basophils Absolute: 0 10*3/uL (ref 0.0–0.1)
Basophils Relative: 1 % (ref 0–1)
Creatinine, Ser: 0.85 mg/dL (ref 0.40–1.20)
INR: 1 (ref <1.50)
Ketones, ur: NEGATIVE mg/dL
MCHC: 32.4 g/dL (ref 30.0–36.0)
Monocytes Relative: 6 % (ref 3–12)
Neutro Abs: 2.3 10*3/uL (ref 1.7–7.7)
Neutrophils Relative %: 48 % (ref 43–77)
Prothrombin Time: 13.4 s (ref 11.6–15.2)
RBC: 4.92 M/uL (ref 3.87–5.11)
RDW: 15.4 % (ref 11.5–15.5)
Total Bilirubin: 0.4 mg/dL (ref 0.3–1.2)
Urine Glucose: NEGATIVE mg/dL
aPTT: 29 s (ref 24–37)
pH: 7 (ref 5.0–8.0)

## 2010-05-26 LAB — URINALYSIS
Glucose, UA: NEGATIVE mg/dL
Protein, ur: NEGATIVE mg/dL
Specific Gravity, Urine: 1.014 (ref 1.005–1.030)
pH: 7 (ref 5.0–8.0)

## 2010-05-26 LAB — CBC WITH DIFFERENTIAL/PLATELET
Basophils Absolute: 0 10*3/uL (ref 0.0–0.1)
Basophils Relative: 1 % (ref 0–1)
Eosinophils Relative: 10 % — ABNORMAL HIGH (ref 0–5)
HCT: 43.8 % (ref 36.0–46.0)
MCHC: 32.4 g/dL (ref 30.0–36.0)
Monocytes Absolute: 0.3 10*3/uL (ref 0.1–1.0)
Neutro Abs: 2.3 10*3/uL (ref 1.7–7.7)
RDW: 15.4 % (ref 11.5–15.5)

## 2010-05-26 LAB — COMPREHENSIVE METABOLIC PANEL
AST: 18 U/L (ref 0–37)
Alkaline Phosphatase: 79 U/L (ref 39–117)
BUN: 10 mg/dL (ref 6–23)
Creat: 0.85 mg/dL (ref 0.40–1.20)
Potassium: 4 mEq/L (ref 3.5–5.3)

## 2010-05-26 LAB — APTT: aPTT: 29 seconds (ref 24–37)

## 2010-05-26 LAB — PROTIME-INR: INR: 1 (ref <1.50)

## 2010-05-26 NOTE — Telephone Encounter (Signed)
Pt came in today and was seen for blood in urine.  Still don't know if she had an UTI or not.  Was not given a rx for visit.  Please call to let her know what her status is.

## 2010-05-26 NOTE — Progress Notes (Signed)
[facility] called and stated that they do a hematuria protocol and it does not require that the pt drink the contrast and they changed the order to abdominal and pelvic CT with and without contrast. They will call pt and inform her of this.Loralee Pacas [place]

## 2010-05-26 NOTE — Progress Notes (Signed)
Pt has appt for 03.15.2012 @ 800 am at [facility] imaging 301 location. Pt instructed to go p/u contrast dye today. And begin drinking the contrast at 6 am.  Lab results to be faxed to [phone].Loralee Pacas [place]

## 2010-05-26 NOTE — Patient Instructions (Signed)
Checking some bloodwork. CT scan of your belly. Come back to see me in a week to discuss results.  -Dr. Karie Schwalbe.

## 2010-05-26 NOTE — Telephone Encounter (Signed)
If there is no burning, frequency, urgency, or flank pain then its not a UTI, no matter what the urine test shows, that would be called asymptomatic bacteriuria which we don't treat.  Awaiting culture but this doesn't look like a UTI.   No antibiotics needed based on tests so far but need culture. Awaiting other labs results and imaging. -Dr. Karie Schwalbe.

## 2010-05-26 NOTE — Progress Notes (Signed)
Addended by: Terese Door on: 05/26/2010 02:14 PM   Modules accepted: Orders

## 2010-05-26 NOTE — Progress Notes (Signed)
  Subjective:    Patient ID: Anna Berry, female    DOB: 22-Oct-1960, 50 y.o.   MRN: [id number]  HPI 50 yo female s/p hysterectomy years ago, with 2 yrs gross hematuria.  Went to urology 2 yrs ago, renal US showed just cysts.  No fevers/chills, no dysuria/CVAT, no wt loss, no night sweats.  No hx work with dyes.  Bleeding is intermittent but bothersome to her.  She also has a hx atrophic vaginitis for which she is rx'ed vaginal estrogen.  No hx bladder or renal trauma or instrumentation.  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   Review of Systems    See HPI Objective:   Physical Exam  Constitutional: She appears well-developed and well-nourished. No distress.  HENT:  Head: Normocephalic.  Mouth/Throat: Oropharynx is clear and moist. No oropharyngeal exudate.  Eyes: Conjunctivae are normal. Pupils are equal, round, and reactive to light.  Neck: Normal range of motion.  Cardiovascular: Normal rate, regular rhythm, normal heart sounds and intact distal pulses.  Exam reveals no gallop and no friction rub.   No murmur heard. Pulmonary/Chest: Effort normal and breath sounds normal. No respiratory distress. She has no wheezes. She has no rales. She exhibits no tenderness.  Abdominal: Soft. She exhibits no distension and no mass. There is no tenderness. There is no rebound and no guarding.  Neurological: She is alert.  Skin: Skin is warm and dry. She is not diaphoretic.          Assessment & Plan:

## 2010-05-26 NOTE — Assessment & Plan Note (Addendum)
Present 2 years. ? Glomerulonephritis vs structural abnormality. S/p urology consultation with neg U/S (showed cysts) 2 yrs ago Checking CBC, CMET, anti-GBM antibody, ANCAs, Urine cytology, send out UA/micro for casts, UCx. CT abd/pelv with PO/IV contrast. RTC 1 week to discuss results.

## 2010-05-27 ENCOUNTER — Encounter: Payer: Self-pay | Admitting: Family Medicine

## 2010-05-27 ENCOUNTER — Telehealth: Payer: Self-pay | Admitting: Family Medicine

## 2010-05-27 LAB — GLOMERULAR BASEMENT MEMBRANE ANTIBODIES: GBM Ab Interp: <1 AU/mL (ref <20)

## 2010-05-27 MED ORDER — CEPHALEXIN 500 MG PO CAPS: 500.0000 mg | ORAL_CAPSULE | Freq: Two times a day (BID) | ORAL | Status: DC

## 2010-05-27 NOTE — Telephone Encounter (Signed)
Please see phone note from yesterday. Still awaiting most lab results.   Urine had lots of blood. Will go ahead and treat with abx since urine showed pos nitrite but really need culture to dx UTI as she didn't really have any symptoms.

## 2010-05-27 NOTE — Telephone Encounter (Signed)
Pt asking for test results from yesterday, wants to know if she has uti & if she needs to pick up rx?

## 2010-05-27 NOTE — Telephone Encounter (Signed)
Sending to provider that saw her yesterday.

## 2010-05-28 LAB — URINE CULTURE: Colony Count: >=100000

## 2010-05-29 ENCOUNTER — Ambulatory Visit
Admission: RE | Admit: 2010-05-29 | Discharge: 2010-05-29 | Disposition: A | Discharge status: Home / Self Care | Payer: 59 | Source: Ambulatory Visit | Attending: Family Medicine | Admitting: Family Medicine

## 2010-05-29 DIAGNOSIS — R319 Hematuria, unspecified: Secondary | ICD-10-CM

## 2010-05-29 MED ORDER — IOHEXOL 300 MG/ML  SOLN
125.0000 mL | Freq: Once | INTRAMUSCULAR | Status: AC | PRN
  Administered 2010-05-29: 125 mL via INTRAVENOUS

## 2010-05-30 ENCOUNTER — Telehealth: Payer: Self-pay | Admitting: *Deleted

## 2010-05-30 NOTE — Telephone Encounter (Signed)
Message copied by Jimmy Footman on Fri May 30, 2010 12:12 PM ------      Message from: Rodney Langton      Created: Thu May 29, 2010  1:34 PM       Pls let pt know CT neg except benign cysts on kidneys.

## 2010-05-30 NOTE — Telephone Encounter (Signed)
LVM for patient to call back. ?

## 2010-06-02 NOTE — Telephone Encounter (Signed)
Pt informed of CT results, but stated that she is still bleeding and would like to know if you would call something in for this.  She uses [facility] on [address], [address]

## 2010-06-02 NOTE — Telephone Encounter (Signed)
Pt has f/u appt on tomorrow so I guess you can make a decision at that time.Anna Berry [place]

## 2010-06-02 NOTE — Telephone Encounter (Signed)
Con't rx anything because I do not yet know what is causing her hematuria.  CT was neg.  Still awaiting most of the labs I ordered at the last visit.  Also haven't received the urine cytology.  Will forward this to [facility] to see if she can shed some light on why we don't have any results yet? Also pt was supposed to set up a 1 week f/u appt with me.

## 2010-06-03 ENCOUNTER — Encounter: Payer: Self-pay | Admitting: Sports Medicine

## 2010-06-03 ENCOUNTER — Ambulatory Visit (INDEPENDENT_AMBULATORY_CARE_PROVIDER_SITE_OTHER): Payer: 59 | Admitting: Sports Medicine

## 2010-06-03 DIAGNOSIS — D573 Sickle-cell trait: Secondary | ICD-10-CM

## 2010-06-03 DIAGNOSIS — K5900 Constipation, unspecified: Secondary | ICD-10-CM | POA: Insufficient documentation

## 2010-06-03 DIAGNOSIS — N390 Urinary tract infection, site not specified: Secondary | ICD-10-CM

## 2010-06-03 DIAGNOSIS — M129 Arthropathy, unspecified: Secondary | ICD-10-CM

## 2010-06-03 DIAGNOSIS — R319 Hematuria, unspecified: Secondary | ICD-10-CM

## 2010-06-03 MED ORDER — SENNOSIDES-DOCUSATE SODIUM 8.6-50 MG PO TABS: 2.0000 | ORAL_TABLET | Freq: Two times a day (BID) | ORAL | Status: AC

## 2010-06-03 NOTE — Assessment & Plan Note (Signed)
Adding senokot s.

## 2010-06-03 NOTE — Assessment & Plan Note (Signed)
Pt will pick up her Keflex.

## 2010-06-03 NOTE — Assessment & Plan Note (Addendum)
Present 2 years. Initially thought Glomerulonephritis vs structural abnormality. 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 S/p urology consultation with neg U/S (showed cysts) 2 yrs ago, but without diagnosis/cause for hematuria. CBC, CMET, anti-GBM antibody, ASO, urine cytology all negative. ANCAs pending still. UCx with ecoli, pt has not yet gotten her Keflex from last week, she will pick it up. I suspect that if we stop all analgesics (NSAIDS and acetaminophen, tramadol is ok) and tx her uti then symptoms will improve. 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 Kidney function remains normal. RTC prn.

## 2010-06-03 NOTE — Progress Notes (Signed)
  Subjective:    Patient ID: Anna Berry, female    DOB: 05-Sep-1960, 50 y.o.   MRN: [id number]  HPI Pt with a long history of hematuria, gross, no dx in chart.  I saw her recently and started a workup, see A/P for further details.  Still with gross hematuria, UCx with 100k col ecoli, pt had not yet picked up her cephalexin.   Review of Systems See HPI    Objective:   Physical Exam  Constitutional: She appears well-developed and well-nourished. No distress.  Skin: Skin is warm and dry.          Assessment & Plan:

## 2010-06-03 NOTE — Assessment & Plan Note (Signed)
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

## 2010-06-03 NOTE — Assessment & Plan Note (Signed)
Note this is the likely cause of her hematuria and papillary necrosis on CT.

## 2010-06-03 NOTE — Patient Instructions (Addendum)
Avoid all pain medicines that are NSAIDS (meloxicam) and tylenol. Tramadol is ok. We have a cause for your bloody urine. Get your Keflex. Come back to see Dr. Earlene Plater at your next scheduled appt. Your renal function remains normal and your sickle cell trait can cause what you are experiencing. SenokotS for constipation.  -Dr. Karie Schwalbe.

## 2010-06-26 ENCOUNTER — Encounter: Payer: Self-pay | Admitting: Family Medicine

## 2010-06-26 ENCOUNTER — Ambulatory Visit (INDEPENDENT_AMBULATORY_CARE_PROVIDER_SITE_OTHER): Payer: 59 | Admitting: Family Medicine

## 2010-06-26 ENCOUNTER — Other Ambulatory Visit: Payer: Self-pay | Admitting: Family Medicine

## 2010-06-26 VITALS — BP 131/84 | HR 87 | Temp 98.6°F | Wt 184.7 lb

## 2010-06-26 DIAGNOSIS — Z1211 Encounter for screening for malignant neoplasm of colon: Secondary | ICD-10-CM

## 2010-06-26 DIAGNOSIS — Z1231 Encounter for screening mammogram for malignant neoplasm of breast: Secondary | ICD-10-CM

## 2010-06-26 DIAGNOSIS — R319 Hematuria, unspecified: Secondary | ICD-10-CM

## 2010-06-26 DIAGNOSIS — E669 Obesity, unspecified: Secondary | ICD-10-CM

## 2010-06-26 DIAGNOSIS — J45909 Unspecified asthma, uncomplicated: Secondary | ICD-10-CM

## 2010-06-26 LAB — POCT URINALYSIS DIPSTICK
Bilirubin, UA: NEGATIVE
Glucose, UA: NEGATIVE
Ketones, UA: NEGATIVE
Nitrite, UA: NEGATIVE
Protein, UA: NEGATIVE
Spec Grav, UA: 1.015
Urobilinogen, UA: 0.2
pH, UA: 6

## 2010-06-26 LAB — POCT UA - MICROSCOPIC ONLY

## 2010-06-26 LAB — LIPID PANEL
Cholesterol: 168 mg/dL (ref 0–200)
HDL: 47 mg/dL (ref >39)
LDL Cholesterol: 97 mg/dL (ref 0–99)
Total CHOL/HDL Ratio: 3.6 Ratio
Triglycerides: 122 mg/dL (ref <150)
VLDL: 24 mg/dL (ref 0–40)

## 2010-06-26 MED ORDER — ALBUTEROL SULFATE HFA 108 (90 BASE) MCG/ACT IN AERS: 2.0000 | INHALATION_SPRAY | RESPIRATORY_TRACT | Status: DC | PRN

## 2010-06-26 MED ORDER — FLUTICASONE PROPIONATE HFA 110 MCG/ACT IN AERO: 2.0000 | INHALATION_SPRAY | Freq: Two times a day (BID) | RESPIRATORY_TRACT | Status: DC

## 2010-06-26 NOTE — Assessment & Plan Note (Signed)
Adding low dose inhaled glucocorticoid inhaler as patient using albuterol inhaler daily. Continue Claritin.

## 2010-06-26 NOTE — Progress Notes (Signed)
  Subjective:     Anna Berry is a 50 y.o. female and is here for a comprehensive physical exam. The patient reports problems - SEE A/P FOR DETAILS.Marland Kitchen  History   Social History  . Marital Status: Married    Spouse Name: N/A    Number of Children: N/A  . Years of Education: N/A   Occupational History  . Not on file.   Social History Main Topics  . Smoking status: Never Smoker   . Smokeless tobacco: Never Used  . Alcohol Use: No  . Drug Use: No  . Sexually Active: Yes   Other Topics Concern  . Not on file   Social History Narrative  . No narrative on file   Health Maintenance  Topic Date Due  . Pap Smear  12/25/1976  . Colonoscopy  12/25/2008  . Influenza Vaccine  12/15/2010  . Mammogram  08/30/2011  . Tetanus/tdap  02/19/2015    The following portions of the patient's history were reviewed and updated as appropriate: allergies, current medications, past family history, past medical history, past social history, past surgical history and problem list.  Review of Systems Pertinent items are noted in HPI.   Objective:    BP 131/84  Pulse 87  Temp(Src) 98.6 F (37 C) (Oral)  Wt 184 lb 11.2 oz (83.779 kg) General appearance: alert, cooperative and no distress Eyes: positive findings: eyelids/periorbital: allergic shiners Ears: normal TM's and external ear canals both ears Nose: clear discharge, turbinates pale, swollen, no sinus tenderness Throat: lips, mucosa, and tongue normal; teeth and gums normal Neck: no adenopathy, no carotid bruit, no JVD, supple, symmetrical, trachea midline and thyroid not enlarged, symmetric, no tenderness/mass/nodules Lungs: clear to auscultation bilaterally Heart: regular rate and rhythm, S1, S2 normal, no murmur, click, rub or gallop Abdomen: soft, non-tender; bowel sounds normal; no masses,  no organomegaly Extremities: extremities normal, atraumatic, no cyanosis or edema Pulses: 2+ and symmetric Skin: Skin color, texture, turgor  normal. No rashes or lesions Lymph nodes: Cervical, supraclavicular, and axillary nodes normal. Neurologic: Grossly normal    Assessment:    Healthy female exam. SEE A/P.     Plan:     See After Visit Summary for Counseling Recommendations

## 2010-06-26 NOTE — Patient Instructions (Signed)
It was nice to see you today!  We are checking a urine and lipid panel.  I have refilled you albuterol and added another daily inhaler.  YOU ARE DUE FOR A MAMMOGRAM. YOU ARE DUE FOR A COLONOSCOPY.

## 2010-06-26 NOTE — Progress Notes (Signed)
Addended by: [place], Marlea Gambill on: 06/26/2010 10:23 AM   Modules accepted: Orders

## 2010-06-26 NOTE — Assessment & Plan Note (Signed)
Due to sickle trait, papillary necrosis. Patient finished Keflex for recent UTI. Will recheck UA today for hematuria.

## 2010-07-18 ENCOUNTER — Ambulatory Visit (AMBULATORY_SURGERY_CENTER): Payer: 59 | Admitting: *Deleted

## 2010-07-18 ENCOUNTER — Encounter: Payer: Self-pay | Admitting: Internal Medicine

## 2010-07-18 VITALS — Ht 66.0 in | Wt 185.5 lb

## 2010-07-18 DIAGNOSIS — Z1211 Encounter for screening for malignant neoplasm of colon: Secondary | ICD-10-CM

## 2010-07-18 MED ORDER — PEG-KCL-NACL-NASULF-NA ASC-C 100 G PO SOLR: ORAL | Status: DC

## 2010-07-29 ENCOUNTER — Encounter: Payer: Self-pay | Admitting: Internal Medicine

## 2010-07-29 ENCOUNTER — Ambulatory Visit (AMBULATORY_SURGERY_CENTER): Payer: 59 | Admitting: Internal Medicine

## 2010-07-29 DIAGNOSIS — Z1211 Encounter for screening for malignant neoplasm of colon: Secondary | ICD-10-CM

## 2010-07-29 DIAGNOSIS — D126 Benign neoplasm of colon, unspecified: Secondary | ICD-10-CM

## 2010-07-29 MED ORDER — SODIUM CHLORIDE 0.9 % IV SOLN: 500.0000 mL | INTRAVENOUS | Status: DC

## 2010-07-29 NOTE — Patient Instructions (Signed)
Findings:  Polyps  Recommendations:  High Fiber Diet  Discharge instructions given and explained to the pt and care giver. Teaching material given to pt on polyps

## 2010-07-30 ENCOUNTER — Telehealth: Payer: Self-pay

## 2010-07-30 NOTE — Telephone Encounter (Signed)

## 2010-08-01 NOTE — H&P (Signed)
NAME:  Anna Berry, Anna Berry                        ACCOUNT NO.:  [id number]   MEDICAL RECORD NO.:  [id number]                   PATIENT TYPE:  INP   LOCATION:  NA                                   FACILITY:  [facility]   PHYSICIAN:  Hal Morales, M.D.             DATE OF BIRTH:  1960/07/26   DATE OF ADMISSION:  01/11/2003  DATE OF DISCHARGE:                                HISTORY & PHYSICAL   HISTORY OF PRESENT ILLNESS:  Anna Berry is a 51 year old married African-  American female, para 2-0-1-2, with a history of uterine fibroids, who  presents for hysterectomy because of menorrhagia and anemia.  Over the past  year the patient's menstrual flow has increased to the point where she  changes a super-long pad every hour for seven consecutive days.  For the  past three months she has experienced this same intense flow twice monthly  for seven days.  She denies any urinary tract symptoms, nausea, vomiting,  diarrhea, fever, dyspareunia, or severe cramping with these episodes.  A  pelvic ultrasound done July 2004 by our patient's primary physician showed a  uterine measurement of 14.1 x 9.5 x 10.7 cm with greater than six fibroids,  the largest of which measures 6.3 x 5.4 x 4.3 cm.  Distortion of the  endometrial lining by many of the fibroids was also noted.  At that time she  had a hemoglobin of 9.7.  Endometrial biopsy in August 2004 revealed benign  proliferative endometrium and a TSH at that same time was also within normal  limits.  The patient was given both surgical and medical options for  management of her symptoms; however, she desires definitive therapy in the  form of a hysterectomy.   PAST MEDICAL HISTORY:  OB:  Gravida 3, para 2-0-1-2, patient experienced  during her pregnancy.  She had cesarean section x2.   GYN history:  Menarche 50 years old.  The patient's menstrual flow is as  previously stated.  She uses bilateral tubal ligation as her method of  contraception.  Denies  any history of abnormal Pap smears or sexually  transmitted diseases.  The patient's last normal Pap smear was June 2004.   Medical history is positive for reactive airway disease.   Surgical history:  Cesarean section in 1981 and 1987, appendectomy in 1999.   The patient denies any history of blood transfusions or problems with  anesthesia.   FAMILY HISTORY:  Positive for stomach cancer and noninsulin-dependent  diabetes.   SOCIAL HISTORY:  The patient is married, and she works in Teaching laboratory technician and  receiving at [employer]   HABITS:  She does not use tobacco or alcohol.   CURRENT MEDICATIONS:  None except an occasional albuterol inhaler as needed.   She has no known drug allergies.   REVIEW OF SYSTEMS:  The patient experiences menstrual headaches and seasonal  allergies, otherwise negative except as mentioned in  her history of present  illness.   PHYSICAL EXAMINATION:  VITAL SIGNS:  Blood pressure is 118/82, weight is 164-  1/2, height is 5 feet 6 inches tall.  NECK:  Supple.  There are no masses, adenopathy, or thyromegaly.  CARDIAC:  Regular rate and rhythm.  There is no murmur.  CHEST:  Lungs are clear to auscultation.  There are no wheezes, rales, or  rhonchi.  BACK:  No CVA tenderness.  ABDOMEN:  Bowel sounds are present.  It is soft.  The patient does have a  firm, tender mass arising from the pelvis approximately three fingerbreadths  above the symphysis pubis.  There is no organomegaly.  EXTREMITIES:  Without clubbing, cyanosis, or edema.  PELVIC:  EG, BUS is within normal limits.  Vagina is rugous.  Cervix is  nontender without lesions.  Uterus irregular, approximately 14 weeks' size,  mobile and tender.  Adnexa without tenderness or masses.  Rectovaginal exam  confirms.   IMPRESSION:  1. Menorrhagia.  2. Anemia.  3. Uterine fibroids.   DISPOSITION:  A discussion was held with the patient regarding the options  for management of her symptoms, which include  observation, hormone therapy,  myomectomy, uterine artery embolization, and hysterectomy.  The patient has  chosen the latter and understands the implications for this procedure along  with its risks, which include but are not limited to reaction to anesthesia,  damage to adjacent organs, excessive bleeding, and infection.  The patient  has consented to undergo a total abdominal hysterectomy at [facility] on January 11, 2003, at 7:30 a.m.      Anna Berry.                    Hal Morales, M.D.    EJP/MEDQ  D:  01/05/2003  T:  01/05/2003  Job:  [id number]

## 2010-08-01 NOTE — Op Note (Signed)
NAME:  Anna Berry, Anna Berry                        ACCOUNT NO.:  [id number]   MEDICAL RECORD NO.:  [id number]                   PATIENT TYPE:  INP   LOCATION:  9399                                 FACILITY:  [facility]   PHYSICIAN:  Hal Morales, M.D.             DATE OF BIRTH:  1960-04-03   DATE OF PROCEDURE:  01/11/2003  DATE OF DISCHARGE:                                 OPERATIVE REPORT   PREOPERATIVE DIAGNOSES:  1. Symptomatic uterine fibroids.  2. Menorrhagia.  3. Anemia.   POSTOPERATIVE DIAGNOSES:  1. Symptomatic uterine fibroids.  2. Menorrhagia.  3. Anemia.  4. Pelvic adhesions.  5. Right ovarian simple cyst.   OPERATION:  Total abdominal hysterectomy with lysis of adhesions.   SURGEON:  Hal Morales, M.D.   FIRST ASSISTANT:  Elmira J. Adline Peals.   ANESTHESIA:  General orotracheal.   ESTIMATED BLOOD LOSS:  250 mL.   COMPLICATIONS:  None.   FINDINGS:  The uterus was enlarged to approximately 14 weeks' size with  multiple myomata.  The right ovary was within normal limits and contained a  2 cm simple cyst containing clear fluid.  The left ovary was likewise within  normal limits.  The tubes were status post interruption for tubal  sterilization.   DESCRIPTION OF PROCEDURE:  The patient was taken to the operating room and  placed on the operating table after appropriate identification.  After  attainment of adequate general anesthesia with patient in the supine  position, the abdomen, perineum, and vagina were prepped with multiple  layers of Betadine.  A Foley catheter was inserted into the bladder and  connected to straight drainage.  The abdomen was draped as a sterile field.  The suprapubic region at the site of the incision was infiltrated with 15 mL  of 0.25% Marcaine.  A suprapubic incision was made at the site of previous  suprapubic incisions and the abdomen opened in layers.  The peritoneum was  entered and the self-retaining O'Connor-O'Sullivan  retractor placed after  examination of the pelvis and upper abdomen.  The bowel was packed cephalad.  The uterus was grasped at the cornual regions with [facility] clamps and  elevated.  The right round ligament was identified, clamped, suture ligated,  and incised, and that incision taken anteriorly on the anterior leaf of the  broad ligament.  The utero-ovarian ligament was isolated, clamped, cut, and  suture ligated.  A similar procedure was carried out with the round ligament  and utero-ovarian ligament on the left side.  The anterior broad ligament  was incised and the bladder dissected sharply off the anterior cervix.  The  uterine arteries on the right and left side were then clamped, cut, and  suture ligated and the uterine fundus removed from the cervix and removed  from the operative field.  The paracervical tissues were then clamped, cut,  and suture ligated after further dissection of the  bladder off the anterior  cervix.  The paracervical tissue and then uterosacral ligaments were  clamped, cut, and suture ligated and the uterosacral ligament sutures held.  The vaginal angles were then clamped, cut, and suture ligated and the  sutures held.  The remainder of the cervix was excised from the upper vagina  and removed from the operative field.  The vaginal cuff was closed with a  figure-of-eight suture.  A Moschowitz suture was then placed in the  posterior cul-de-sac, incorporating both uterosacral ligaments and the  intervening posterior peritoneum.  Hemostasis was achieved with a  combination of 3-0 sutures in the vaginal cuff area and cautery.  Copious  irrigation was carried out.  Hemostasis in the right pelvic sidewall was  achieved by a running suture to reperitonealized on that side.  Copious  irrigation again was carried out.  All sutures used except where otherwise  noted were 0 Vicryl for the procedure and 2-0 Vicryl for hemostatic sutures.  The sutures which had been held  on the uterosacral ligament and the vaginal  angles were tied together on either side.  All instruments and sponges were  then removed from the peritoneal cavity.  The peritoneum was closed with a  running suture of 2-0 Vicryl.  The rectus muscles were made hemostatic with  Bovie cautery and suture.  The rectus fascia was closed with running suture  from each apex to the midline and tied in the midline.  The subcutaneous  tissue was copiously irrigated and made hemostatic with Bovie cautery.  Skin  staples were applied to the skin incision and a sterile dressing applied.  The patient was taken from the operating room to the recovery room in  satisfactory condition, having tolerated the procedure well, with sponge and  instrument count correct.  The weight on the uterus was 540 g.  Specimens to  pathology:  Uterus and cervix.                                               Hal Morales, M.D.    VPH/MEDQ  D:  01/11/2003  T:  01/11/2003  Job:  [id number]

## 2010-08-01 NOTE — Discharge Summary (Signed)
NAME:  Anna Berry, Anna Berry                        ACCOUNT NO.:  [id number]   MEDICAL RECORD NO.:  [id number]                   PATIENT TYPE:  INP   LOCATION:  9311                                 FACILITY:  [facility]   PHYSICIAN:  Hal Morales, M.D.             DATE OF BIRTH:  1960/11/02   DATE OF ADMISSION:  01/11/2003  DATE OF DISCHARGE:  01/13/2003                                 DISCHARGE SUMMARY   DISCHARGE DIAGNOSES:  1. Fibroid uterus.  2. Menorrhagia.  3. Anemia.  4. Pelvic adhesions.  5. Right ovarian cyst.   OPERATION:  On the date of admission the patient underwent a total abdominal  hysterectomy with lysis of adhesions, tolerating procedures well.  The  patient was found to have a fibroid uterus weighing approximately 540 grams,  a simple right ovarian cyst (ruptured during operation), normal-appearing  right tube/left tube and left ovary, pelvic adhesions, and a peritoneal  window in the posterior cul-de-sac.   HISTORY OF PRESENT ILLNESS:  Anna Berry is a 50 year old married African-  American female para 2-0-1-2 with a history of uterine fibroids who presents  for hysterectomy because of menorrhagia and anemia.  Please see the  patient's dictated History and Physical Examination for details.   PREOPERATIVE PHYSICAL EXAMINATION:  VITAL SIGNS:  Blood pressure is 118/82,  weight is 164.5, height is 5 feet 6 inches tall.  GENERAL:  General exam is within normal limits; however, do note on  abdominal exam that the patient's bowel sounds are present, her abdomen is  soft.  She does have a firm mass which is tender arising from the pelvis to  approximately 3 fingerbreadths above the symphysis pubis.  There is no  organomegaly.  PELVIC:  EG/BUS is within normal limits.  The vagina is rugous.  Cervix is  nontender without lesions.  Uterus is irregular, approximately 14 weeks  size, mobile and tender, adnexa without tenderness or masses.  Rectovaginal  exam confirms.   HOSPITAL COURSE:  On the date of admission the patient underwent  aforementioned procedures, tolerating them well.  Postoperative course was  unremarkable with the patient resuming bowel and bladder function by  postoperative day #2 and therefore deemed ready for discharge home.  Postoperative hemoglobin was 9.6 (preoperative hemoglobin is 11.0).   DISCHARGE MEDICATIONS:  1. Percocet one to two tablets q.4h. as needed for pain.  2. Toradol 10 mg one tablet q.6h. as needed for headache.  3. Iron one tablet twice daily for six weeks.  4. Colace 100 mg one to two tablets daily until bowel movements are regular.  5. Phenergan 25 mg one tablet q.6h. as needed for nausea.   FOLLOW-UP:  The patient is to call [facility] on January 16, 2003 for staple removal.  The patient has a six weeks postoperative visit  with Dr. Pennie Rushing on February 22, 2003 at 9:45 a.m.   DISCHARGE INSTRUCTIONS:  The patient  was given a copy of [address] [facility] postoperative instruction sheet.  She was further advised to avoid  driving for two weeks, heavy lifting for four weeks, and intercourse for six  weeks.   FINAL PATHOLOGY:  Uterus:  Uterine cervix with benign ectocervical and  endocervical mucosa.  Uterine corpus with benign proliferative endometrium,  large submucosal leiomyoma, and multiple intramural and subserosal  leiomyomata.     Anna Berry.                    Hal Morales, M.D.    EJP/MEDQ  D:  01/31/2003  T:  02/01/2003  Job:  [id number]

## 2010-08-05 ENCOUNTER — Encounter: Payer: Self-pay | Admitting: Internal Medicine

## 2010-08-29 ENCOUNTER — Ambulatory Visit (INDEPENDENT_AMBULATORY_CARE_PROVIDER_SITE_OTHER): Payer: 59 | Admitting: Family Medicine

## 2010-08-29 ENCOUNTER — Encounter: Payer: Self-pay | Admitting: Family Medicine

## 2010-08-29 VITALS — BP 123/79 | HR 77 | Wt 183.0 lb

## 2010-08-29 DIAGNOSIS — N172 Acute kidney failure with medullary necrosis: Secondary | ICD-10-CM

## 2010-08-29 DIAGNOSIS — N12 Tubulo-interstitial nephritis, not specified as acute or chronic: Secondary | ICD-10-CM

## 2010-08-29 DIAGNOSIS — R319 Hematuria, unspecified: Secondary | ICD-10-CM

## 2010-08-29 LAB — POCT URINALYSIS DIPSTICK
Glucose, UA: NEGATIVE
Spec Grav, UA: 1.01
Urobilinogen, UA: 0.2

## 2010-08-29 NOTE — Progress Notes (Signed)
  Subjective:    Patient ID: Anna Berry, female    DOB: 07-21-1960, 50 y.o.   MRN: [id number]  HPI 50 year old woman presents c/o hematuria.  On Wednesday, 08/27/10, she experienced one episode of hematuria (dark red/wine-colored, which is different from the bright red blood she saw during previous episodes of hematuria).  Since the one episode, she has had no more visible blood in her urine. She denies clots in urine, back/flank pain, fever, chills, diaphoresis, frequency, hesitancy, urgency, dysuria, bladder spasm, or small volumes of urine.  Last had intercourse >2 weeks ago.  The pt has been seen at least twice since March 2012 for hematuria.  In March, she had gross hematuria, and underwent a significant work-up including renal ultrasounds, abd/pelvic CT w and w/o contrast, and many blood tests.  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  At that time, she also was found to have a UTI  (E.Coli), of which she was aysmptomatic (other than hematuria): she was treated with keflex, but C&S reveals the microbe was only a moderately sensitivity to this drug.        Review of Systems  Constitutional: Negative for fever, chills, diaphoresis and fatigue.  HENT: Positive for sinus pressure. Negative for sore throat.        She suspects r/t seasonal allergies  Respiratory: Negative for cough.   Gastrointestinal: Negative for nausea and vomiting.  Genitourinary: Positive for hematuria. Negative for dysuria, urgency, frequency, flank pain, decreased urine volume, vaginal bleeding, vaginal discharge, enuresis, difficulty urinating, vaginal pain, pelvic pain and dyspareunia.       Pt only had one episode of hematuria, and no longer has visible (to the eye) blood in urine (per HPI)  Skin: Negative for pallor and rash.  Neurological: Positive for headaches. Negative for weakness.       Reports r/t sinus pressure  Hematological: Negative  for adenopathy.  Psychiatric/Behavioral: Negative for confusion and decreased concentration.       Objective:   Physical Exam  Constitutional: She is oriented to [place], place, and time. She appears well-developed and well-nourished. No distress.  HENT:  Head: Normocephalic and atraumatic.  Mouth/Throat: Oropharynx is clear and moist. No oropharyngeal exudate.  Neck: Neck supple.  Cardiovascular: Normal rate and regular rhythm.   Abdominal: Soft. Normal aorta and bowel sounds are normal. She exhibits no distension and no mass. There is no hepatosplenomegaly. There is no tenderness. There is no rebound, no guarding and no CVA tenderness.  Lymphadenopathy:    She has no cervical adenopathy.  Neurological: She is alert and oriented to [place], place, and time.  Skin: Skin is warm and dry. No rash noted. She is not diaphoretic.  Psychiatric: She has a normal mood and affect.          Assessment & Plan:

## 2010-08-29 NOTE — Assessment & Plan Note (Signed)
1. Hematuria: blood and leukocytes noted on today's urine dipstick.  No more grossly visible blood in urine.  Suspect related to renal papillary necrosis.  A. Urine C&S sent out.  B. Patient given prescription for Bactrim DS, 1 tab BID X3 days, Disp #6.  Instructed pt NOT to fill prescription unless we call her to confirm she has a urinary tract infection.  She verbalized understanding of this.  C. Will initiate referral to nephrology.  D. Pt instructed to drink plenty of water, and to call (or go to urgent care/emergency room if over the weekend) if she develops fever, chills, back/flank pain, or symptoms of UTI (dysuria, frequency, urgency, hesitancy, small urine volumes, bladder spasm/pelvic pain).

## 2010-08-29 NOTE — Patient Instructions (Signed)
Your urine specimen did have blood and some white blood cells in it.  Because you had a recent urinary tract infection (UTI) in March, we have sent your urine off for culture.  We will know the results of that culture, likely, over the weekend.  If you do have a UTI, we will call to notify you.  At that time, you may fill the prescription for Bactrim that we gave to you on Friday.  Please do not fill the prescription unless we call you to confirm that you have a UTI.  Otherwise, drink lots of water, 8-10 glasses minimum daily.  Do not hold in your urine if you can prevent it.  Urinate promptly after intercourse.    Call (or go to urgent care/emergency room over the weekend) if you develop fever, chills, low back pain, or urinary symptoms (painful urination, small volumes of urine, needing to urinate more frequently, feelings of spasm after urination, and/or pelvic pain).

## 2010-09-01 ENCOUNTER — Ambulatory Visit (HOSPITAL_COMMUNITY): Payer: 59

## 2010-09-01 ENCOUNTER — Ambulatory Visit
Admission: RE | Admit: 2010-09-01 | Discharge: 2010-09-01 | Disposition: A | Discharge status: Home / Self Care | Payer: 59 | Source: Ambulatory Visit | Attending: Family Medicine | Admitting: Family Medicine

## 2010-09-01 ENCOUNTER — Other Ambulatory Visit: Payer: Self-pay | Admitting: Family Medicine

## 2010-09-01 DIAGNOSIS — Z1231 Encounter for screening mammogram for malignant neoplasm of breast: Secondary | ICD-10-CM

## 2010-09-01 LAB — URINE CULTURE: Colony Count: >=100000

## 2010-09-01 MED ORDER — CEPHALEXIN 500 MG PO CAPS: 500.0000 mg | ORAL_CAPSULE | Freq: Three times a day (TID) | ORAL | Status: AC

## 2010-09-01 NOTE — Progress Notes (Signed)
Addended by: Luretha Murphy T on: 09/01/2010 11:51 AM   Modules accepted: Orders

## 2010-10-09 ENCOUNTER — Encounter: Payer: Self-pay | Admitting: Family Medicine

## 2010-10-09 ENCOUNTER — Ambulatory Visit (INDEPENDENT_AMBULATORY_CARE_PROVIDER_SITE_OTHER): Payer: 59 | Admitting: Family Medicine

## 2010-10-09 VITALS — BP 121/79 | HR 80 | Temp 98.7°F | Ht 65.0 in | Wt 182.0 lb

## 2010-10-09 DIAGNOSIS — N390 Urinary tract infection, site not specified: Secondary | ICD-10-CM

## 2010-10-09 LAB — POCT URINALYSIS DIPSTICK
Ketones, UA: NEGATIVE
Protein, UA: NEGATIVE
Urobilinogen, UA: 0.2
pH, UA: 6.5

## 2010-10-09 LAB — POCT UA - MICROSCOPIC ONLY

## 2010-10-09 MED ORDER — CEPHALEXIN 500 MG PO CAPS: 500.0000 mg | ORAL_CAPSULE | Freq: Two times a day (BID) | ORAL | Status: AC

## 2010-10-09 NOTE — Assessment & Plan Note (Signed)
UTI based on UA results. Has a history of multiple UTIs in the past. Will treat with keflex and obtain culture for sensitivities.  Will f/u for CPE in a few months. Red flags reviewed.

## 2010-10-09 NOTE — Patient Instructions (Signed)
Thank you for coming in today. You have a UTI. We will culture your urine to make sure my antibiotic will work on it.  Come back if you are not feeling better in a few days or you have fevers or chills.

## 2010-10-09 NOTE — Progress Notes (Signed)
Noted dysuria starting this week. Noted blood in the urine today. Noted lower back pain, hurting with urination, and urgency. No changes in urine aside from blood. No vaginal discharge.   PMH reviewed.  ROS as above otherwise neg  Exam:  BP 121/79  Pulse 80  Temp(Src) 98.7 F (37.1 C) (Oral)  Ht 5\' 5"  (1.651 m)  Wt 182 lb (82.555 kg)  BMI 30.29 kg/m2 Gen: Well NAD Lungs: CTABL Nl WOB Heart: RRR no MRG Abd: NABS, NT, ND Exts: Non edematous BL  LE, warm and well perfused.

## 2010-10-11 LAB — URINE CULTURE: Colony Count: >=100000

## 2010-11-11 ENCOUNTER — Telehealth: Payer: Self-pay | Admitting: *Deleted

## 2010-11-11 NOTE — Telephone Encounter (Signed)
Appointment scheduled at [facility] for 11/28/2010 at 12:45 PM. Patient is aware.

## 2010-11-14 ENCOUNTER — Telehealth: Payer: Self-pay | Admitting: Family Medicine

## 2010-11-14 NOTE — Telephone Encounter (Signed)
Ms. Bown is calling because she has blood in her urine again.  She scheduled an appt with Dr. Denyse Amass but his first available isn't until 9/12 at 1:45.  She isn't in any pain or anything, but she is concerned that if this is an infection she shouldn't wait that long.  She wants to see Dr. Denyse Amass because she also needs refills on her meds.  She was told that if anything changes, we can get her in with another MD, but she really wants to see Dr. Denyse Amass.  She would like Dr. Denyse Amass to call her back so she can discuss further with him.

## 2010-11-14 NOTE — Telephone Encounter (Signed)
Will fwd. To Dr.Corey for advise. .Anna Berry  

## 2010-11-18 NOTE — Telephone Encounter (Addendum)
I called her back. No symptoms and no recurred blood in her urine. I advised her to wait until she has her appointment with me.  I gave red flag warning signs.

## 2010-11-26 ENCOUNTER — Ambulatory Visit (INDEPENDENT_AMBULATORY_CARE_PROVIDER_SITE_OTHER): Payer: 59 | Admitting: Family Medicine

## 2010-11-26 DIAGNOSIS — R319 Hematuria, unspecified: Secondary | ICD-10-CM

## 2010-11-26 DIAGNOSIS — R5383 Other fatigue: Secondary | ICD-10-CM | POA: Insufficient documentation

## 2010-11-26 DIAGNOSIS — F329 Major depressive disorder, single episode, unspecified: Secondary | ICD-10-CM

## 2010-11-26 DIAGNOSIS — R5381 Other malaise: Secondary | ICD-10-CM

## 2010-11-26 DIAGNOSIS — Z23 Encounter for immunization: Secondary | ICD-10-CM

## 2010-11-26 DIAGNOSIS — N390 Urinary tract infection, site not specified: Secondary | ICD-10-CM

## 2010-11-26 DIAGNOSIS — F32A Depression, unspecified: Secondary | ICD-10-CM

## 2010-11-26 LAB — POCT URINALYSIS DIPSTICK
Glucose, UA: NEGATIVE
Ketones, UA: NEGATIVE
Spec Grav, UA: 1.01
Urobilinogen, UA: 0.2

## 2010-11-26 MED ORDER — CEPHALEXIN 500 MG PO CAPS: 500.0000 mg | ORAL_CAPSULE | Freq: Two times a day (BID) | ORAL | Status: AC

## 2010-11-26 MED ORDER — SERTRALINE HCL 25 MG PO TABS: 25.0000 mg | ORAL_TABLET | Freq: Every day | ORAL | Status: DC

## 2010-11-26 NOTE — Assessment & Plan Note (Signed)
I thank you to depression as noted below

## 2010-11-26 NOTE — Assessment & Plan Note (Signed)
I think her fatigue is a symptom of depression. We discussed treatment options and patient agrees for Zoloft initially. We'll start at 25 mg at night and followup patient in 2-4 weeks. Discussed warning signs for SI or HI and patient expresses understanding.

## 2010-11-26 NOTE — Assessment & Plan Note (Signed)
Given the presence of casts I suspect this is due to her renal papillary necrosis. She has an appointment with a nephrologist this week. I will follow up visit note

## 2010-11-26 NOTE — Patient Instructions (Signed)
Thank you for coming in today. Take the antibiotics. I will call if we picked the wrong one.  See the kidney doctor this week.  I will start sertaline (zoloft).  We will start at 25mg  at night. Most people get sleepy with this medicine so take it at night.  If it make you awake take it in the morning.  Come back in 2-4 weeks.  Let me know if you feel like hurting yourself or others.

## 2010-11-26 NOTE — Progress Notes (Signed)
Ms. Anna Berry presents to clinic to followup her hematuria.  Hematuria. At her last visit she was diagnosed with a urinary tract infection with blood in her urine. She was treated with Keflex and did well. However 2 weeks ago she redeveloped her dysuria symptoms and noted some blood in her urine. However she has been unable to  get medical care until today. Her dysuria has improved some however is still present. She denies any visible blood in her urine currently. She denies any back or flank, or abdominal pain. No fevers or chills.  Fatigue: Ms. Sharol Given has had fatigue for several months to a year. She notes daytime sleepiness trouble falling asleep. She denies any neck mass or feeling cold all the time or hair loss. She does note that she is going through perimenopause currently. Additionally she notes that her husband has been recently diagnosed with bone cancer and this is stressful for her and her husband. While screening with a PHQ9 she scored 14. She has never been diagnosed with depression in the past and denies any suicidal or homicidal ideation.   PMH reviewed.  ROS as above otherwise neg Medications reviewed.  Exam:  BP 124/80  Pulse 86  Temp(Src) 98.6 F (37 C) (Oral)  Ht 5\' 6"  (1.676 m)  Wt 184 lb (83.462 kg)  BMI 29.70 kg/m2 Gen: Well NAD HEENT: EOMI,  MMM, no neck mass Lungs: CTABL Nl WOB Heart: RRR no MRG Abd: NABS, NT, ND, no CV tenderness. Exts: Non edematous BL  LE, warm and well perfused.  Mood: Normal speech normal affect good eye contact no delusions or hallucinations expressed no SI or HI.  Urine microscopy: Many Red blood cells seen free and in casts.  Many rod-shaped bacteria seen.

## 2010-11-26 NOTE — Assessment & Plan Note (Signed)
Recurrent urinary tract infection versus ineffective treatment with prior urinary tract infection. I favor new infection as patient had a symptom-free interval. Will treat with Keflex again but will obtain urine culture. If organism is resistant will switch to effective antibiotic.

## 2010-11-27 LAB — CBC
MCH: 29.2 pg (ref 26.0–34.0)
Platelets: 244 10*3/uL (ref 150–400)
RBC: 4.73 MIL/uL (ref 3.87–5.11)
RDW: 15.2 % (ref 11.5–15.5)
WBC: 6 10*3/uL (ref 4.0–10.5)

## 2010-11-27 LAB — BASIC METABOLIC PANEL WITH GFR
CO2: 25 mEq/L (ref 19–32)
Calcium: 9.3 mg/dL (ref 8.4–10.5)
GFR, Est Non African American: 55 mL/min — ABNORMAL LOW (ref >60)
Sodium: 145 mEq/L (ref 135–145)

## 2010-11-29 LAB — URINE CULTURE: Colony Count: >=100000

## 2010-12-18 ENCOUNTER — Encounter: Payer: Self-pay | Admitting: Family Medicine

## 2010-12-18 ENCOUNTER — Ambulatory Visit (INDEPENDENT_AMBULATORY_CARE_PROVIDER_SITE_OTHER): Payer: 59 | Admitting: Family Medicine

## 2010-12-18 DIAGNOSIS — F32A Depression, unspecified: Secondary | ICD-10-CM

## 2010-12-18 DIAGNOSIS — R5381 Other malaise: Secondary | ICD-10-CM

## 2010-12-18 DIAGNOSIS — F329 Major depressive disorder, single episode, unspecified: Secondary | ICD-10-CM

## 2010-12-18 DIAGNOSIS — F3289 Other specified depressive episodes: Secondary | ICD-10-CM

## 2010-12-18 DIAGNOSIS — R5383 Other fatigue: Secondary | ICD-10-CM

## 2010-12-18 NOTE — Patient Instructions (Signed)
Thank you for coming in today. Try cutting the sertaline in half and taking that at night. If you notice return of you fatigue/depression symptoms start back on the full dose.  See me in 5 months and we will talk about stopping the medicine.  You have had a great response to it.  Stay active and get exercise 30 mins a day 5 days a week. This is a 2 mile walk a day.

## 2010-12-18 NOTE — Progress Notes (Signed)
Anna Berry presents to clinic today to followup her fatigue/depression.  In the interim she has done well with Zoloft at night. She is currently taking 25 mg. She feels like she is doing a bit better. However she notes feeling groggy when waking up and then foggy at work. Feels a little light headed. This started with zoloft.    PHQ9 today is 4. No SI/HI  Health maintenance: Colonoscopy mammograms up-to-date. Not a candidate for Pap smear due to hysterectomy.  PMH reviewed.  ROS as above otherwise neg Medications reviewed.  Exam:  BP 110/78  Temp(Src) 98.4 F (36.9 C) (Oral)  Ht 5\' 6"  (1.676 m)  Wt 181 lb (82.101 kg)  BMI 29.21 kg/m2 Gen: Well NAD Psychiatry: Affect attention mood are normal speech is normal linear and goal-directed judgment is intact no SI HI.

## 2010-12-18 NOTE — Assessment & Plan Note (Signed)
I think primarily due to depression please see that section

## 2010-12-18 NOTE — Assessment & Plan Note (Signed)
Improved with Zoloft therapy. Having a bit of side effects with I think.  She has such great symptom control I will not change. Will decrease dose to 12.5 mg at night. Will followup in 5 months for consideration of discontinuation of SSRI therapy.   Please see patient instructions for further plan.

## 2011-01-07 ENCOUNTER — Ambulatory Visit (INDEPENDENT_AMBULATORY_CARE_PROVIDER_SITE_OTHER): Payer: 59 | Admitting: Family Medicine

## 2011-01-07 ENCOUNTER — Encounter: Payer: Self-pay | Admitting: Family Medicine

## 2011-01-07 VITALS — BP 115/78 | HR 92 | Ht 66.0 in | Wt 186.9 lb

## 2011-01-07 DIAGNOSIS — R319 Hematuria, unspecified: Secondary | ICD-10-CM

## 2011-01-07 DIAGNOSIS — N172 Acute kidney failure with medullary necrosis: Secondary | ICD-10-CM

## 2011-01-07 DIAGNOSIS — N12 Tubulo-interstitial nephritis, not specified as acute or chronic: Secondary | ICD-10-CM

## 2011-01-07 NOTE — Patient Instructions (Signed)
Thank you for coming in today. 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 If it doesn't get better in a few days I would like her kidney doctors know. If you develops symptoms of a urinary tract infection, please let me know I will start treated with antibiotics. Right now even if you have bacteria in your bladder I'm not going to treat it because you don't have symptoms. Take care and I will see you as needed.

## 2011-01-08 ENCOUNTER — Encounter: Payer: Self-pay | Admitting: Family Medicine

## 2011-01-08 NOTE — Progress Notes (Signed)
Ms. Pierro is to clinic today with recurrent hematuria. This started yesterday. Is not associated with dysuria polyuria abdominal pain back pain fevers or chills. She feels well.  She has been to both a nephrologist and urologist she's had a cystoscopy. 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 Hematuria is resolving since yesterday.  PMH reviewed.  ROS as above otherwise neg Medications reviewed.  Exam:  BP 115/78  Pulse 92  Ht 5\' 6"  (1.676 m)  Wt 186 lb 14.4 oz (84.777 kg)  BMI 30.17 kg/m2 Gen: Well NAD Abd: NABS, NT, ND Exts: Non edematous BL  LE, warm and well perfused.

## 2011-01-08 NOTE — Assessment & Plan Note (Signed)
This is known to be the cause of her hematuria. Plan to follow, if worsening or associated with pain will come back or go to nephrology. I feel no need to do urinalysis today as she is asymptomatic. Should she have itchy area in her urine I will call this asymptomatic bacteria and not prescribe antibiotics.  We'll followup as needed

## 2011-06-19 ENCOUNTER — Ambulatory Visit (INDEPENDENT_AMBULATORY_CARE_PROVIDER_SITE_OTHER): Payer: 59 | Admitting: Family Medicine

## 2011-06-19 ENCOUNTER — Encounter: Payer: Self-pay | Admitting: Family Medicine

## 2011-06-19 VITALS — BP 132/86 | HR 78 | Ht 66.0 in | Wt 188.2 lb

## 2011-06-19 DIAGNOSIS — M25569 Pain in unspecified knee: Secondary | ICD-10-CM

## 2011-06-19 DIAGNOSIS — M25519 Pain in unspecified shoulder: Secondary | ICD-10-CM

## 2011-06-19 DIAGNOSIS — M79609 Pain in unspecified limb: Secondary | ICD-10-CM

## 2011-06-19 DIAGNOSIS — M79672 Pain in left foot: Secondary | ICD-10-CM

## 2011-06-19 DIAGNOSIS — M25561 Pain in right knee: Secondary | ICD-10-CM

## 2011-06-19 DIAGNOSIS — M25512 Pain in left shoulder: Secondary | ICD-10-CM

## 2011-06-19 NOTE — Patient Instructions (Signed)
Thank you for coming in today. Please come back in 1 month or less for your left shoulder.  Your right knee should be feeling better soonish.  Let me know if it gets really red and painful.  Ice your knee tonight.  We a compressive sleeve on your knee tomorrow and next week.   Knee Injection Joint injections are shots. Your caregiver will place a needle into your knee joint. The needle is used to put medicine into the joint. These shots can be used to help treat different painful knee conditions such as osteoarthritis, bursitis, local flare-ups of rheumatoid arthritis, and pseudogout. Anti-inflammatory medicines such as corticosteroids and anesthetics are the most common medicines used for joint and soft tissue injections.   PROCEDURE  The skin over the kneecap will be cleaned with an antiseptic solution.   Your caregiver will inject a small amount of a local anesthetic (a medicine like Novocaine) just under the skin in the area that was cleaned.   After the area becomes numb, a second injection is done. This second injection usually includes an anesthetic and an anti-inflammatory medicine called a steroid or cortisone. The needle is carefully placed in between the kneecap and the knee, and the medicine is injected into the joint space.   After the injection is done, the needle is removed. Your caregiver may place a bandage over the injection site. The whole procedure takes no more than a couple of minutes.  BEFORE THE PROCEDURE   Wash all of the skin around the entire knee area. Try to remove any loose, scaling skin. There is no other specific preparation necessary unless advised otherwise by your caregiver. LET YOUR CAREGIVER KNOW ABOUT:    Allergies.   Medications taken including herbs, eye drops, over the counter medications, and creams.   Use of steroids (by mouth or creams).   Possible pregnancy, if applicable.   Previous problems with anesthetics or Novocaine.   History of  blood clots (thrombophlebitis).   History of bleeding or blood problems.   Previous surgery.   Other health problems.  RISKS AND COMPLICATIONS Side effects from cortisone shots are rare. They include:    Slight bruising of the skin.   Shrinkage of the normal fatty tissue under the skin where the shot was given.   Increase in pain after the shot.   Infection.   Weakening of tendons or tendon rupture.   Allergic reaction to the medicine.   Diabetics may have a temporary increase in their blood sugar after a shot.   Cortisone can temporarily weaken the immune system. While receiving these shots, you should not get certain vaccines. Also, avoid contact with anyone who has chickenpox or measles. Especially if you have never had these diseases or have not been previously immunized. Your immune system may not be strong enough to fight off the infection while the cortisone is in your system.  AFTER THE PROCEDURE    You can go home after the procedure.   You may need to put ice on the joint 15 to 20 minutes every 3 or 4 hours until the pain goes away.   You may need to put an elastic bandage on the joint.  HOME CARE INSTRUCTIONS    Only take over-the-counter or prescription medicines for pain, discomfort, or fever as directed by your caregiver.   You should avoid stressing the joint. Unless advised otherwise, avoid activities that put a lot of pressure on a knee joint, such as:   Jogging.  Bicycling.   Recreational climbing.   Hiking.   Laying down and elevating the leg/knee above the level of your heart can help to minimize swelling.  SEEK MEDICAL CARE IF:    You have repeated or worsening swelling.   There is drainage from the puncture area.   You develop red streaking that extends above or below the site where the needle was inserted.  SEEK IMMEDIATE MEDICAL CARE IF:    You develop a fever.   You have pain that gets worse even though you are taking pain medicine.     The area is red and warm, and you have trouble moving the joint.  MAKE SURE YOU:    Understand these instructions.   Will watch your condition.   Will get help right away if you are not doing well or get worse.  Document Released: 05/24/2006 Document Revised: 02/19/2011 Document Reviewed: 02/18/2007 [facility] Patient Information 2012 [place], [place].

## 2011-06-22 ENCOUNTER — Encounter: Payer: Self-pay | Admitting: Family Medicine

## 2011-06-22 DIAGNOSIS — M79672 Pain in left foot: Secondary | ICD-10-CM | POA: Insufficient documentation

## 2011-06-22 DIAGNOSIS — M25512 Pain in left shoulder: Secondary | ICD-10-CM | POA: Insufficient documentation

## 2011-06-22 DIAGNOSIS — M25561 Pain in right knee: Secondary | ICD-10-CM | POA: Insufficient documentation

## 2011-06-22 NOTE — Assessment & Plan Note (Signed)
Right knee pain with effusion likely osteoarthritis.  Treated with cortisone injection today in clinic.  Plan to followup within one month. Encouraged compression sleeve and icing as needed.  Handout provided discussed warning signs. Please see patient instructions.

## 2011-06-22 NOTE — Assessment & Plan Note (Signed)
I'm not sure of the cause of this pain. Pain is located at the fifth metatarsal head.  Possible metatarsalgia however I think it's possible this is just not wide enough shoes.  Encouraged comfortable well fitting wide shoes and followup with me if not improved. Patient expresses understanding

## 2011-06-22 NOTE — Progress Notes (Signed)
Anna Berry is a 51 y.o. female who presents to [facility] today for   1) right knee effusion: Present for one week. Patient has had pain off and on her right knee especially after working all day. She works at [employer] and is on her feet all day.  She denies any locking catching twisting or popping. She denies any specific injury. She denies any radiculopathy into her foot weakness or numbness.    2) left shoulder pain.  Located in the anterior and lateral aspects of her shoulder. Notes pain in her shoulder when she sleeps on that side or has to reach across her body or up.  Denies any neck pain.  Denies any history of shoulder injury.  Denies any hand weakness or numbness.    3) left foot pain: Gradual onset over several months. Pain in the lateral aspect of her foot especially after walking all day long.  Denies any injury.     PMH, SH reviewed: Significant for renal papillary necrosis ROS as above otherwise neg. No Chest pain, palpitations, SOB, Fever, Chills, Abd pain, N/V/D.  Medications reviewed. Current Outpatient Prescriptions  Medication Sig Dispense Refill  . albuterol (VENTOLIN HFA) 108 (90 BASE) MCG/ACT inhaler Inhale 2 puffs into the lungs every 4 (four) hours as needed.  1 Inhaler  11  . Calcium-Vitamin D (CALTRATE 600 PLUS-VIT D PO) Take 1 tablet by mouth daily.        . fluticasone (FLOVENT HFA) 110 MCG/ACT inhaler Inhale 2 puffs into the lungs 2 (two) times daily.  1 Inhaler  2  . loratadine (CLARITIN) 10 MG tablet Take 10 mg by mouth daily.        . Multiple Vitamins-Minerals (MULTIVITAMIN WITH MINERALS) tablet Take 1 tablet by mouth daily.        . sertraline (ZOLOFT) 25 MG tablet Take 1 tablet (25 mg total) by mouth daily.  30 tablet  1  . traMADol (ULTRAM) 50 MG tablet Take 50 mg by mouth every 4 (four) hours as needed.         Current Facility-Administered Medications  Medication Dose Route Frequency Provider Last Rate Last Dose  . 0.9 %  sodium chloride infusion  500 mL  Intravenous Continuous Hart Carwin, MD        Exam:  BP 132/86  Pulse 78  Ht 5\' 6"  (1.676 m)  Wt 188 lb 3.2 oz (85.367 kg)  BMI 30.38 kg/m2 Gen: Well NAD HEENT: EOMI,  MMM Lungs: CTABL Nl WOB Heart: RRR no MRG MSK:  Neck: Nontender on the posterior midline. Normal motion. Negative Spurling. Left shoulder: Normal motion and strength.  Tender along the a.c. Joint.  Negative Hawkins. Positive nears. Positive empty can. Positive O'Brien.  Positive crossover compression.  Negative speeds test Left foot: Tender along the head of the fifth metatarsal.  Otherwise normal-appearing. Right knee: Moderate effusion. Some crepitations with range of motion.  Normal Lachman's test McMurray's valgus and varus stress.    Knee injection: Consent obtained and timeout performed.  The knee was inspected in the area just medial to the patellar tendon was palpated and marked.  Betadine was applied and then removed with an alcohol swab.  Ethylene chloride spray was used in a 23-gauge 1.5 inch needle was inserted into the knee.  4 mL of 1% lidocaine without epinephrine and 40 mg Kenalog was injected into the knee.  No bleeding patient tolerated the procedure well.  No results found for this or any previous visit (from  the past 72 hour(s)).

## 2011-06-22 NOTE — Assessment & Plan Note (Signed)
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 Plan to followup in clinic within one month or so for ultrasound and potential ultrasound-guided injection of the left a.c. Joint.

## 2011-07-22 ENCOUNTER — Encounter: Payer: Self-pay | Admitting: Family Medicine

## 2011-07-22 ENCOUNTER — Ambulatory Visit (INDEPENDENT_AMBULATORY_CARE_PROVIDER_SITE_OTHER): Payer: 59 | Admitting: Family Medicine

## 2011-07-22 VITALS — BP 129/86 | HR 90 | Ht 66.5 in | Wt 189.5 lb

## 2011-07-22 DIAGNOSIS — M19019 Primary osteoarthritis, unspecified shoulder: Secondary | ICD-10-CM

## 2011-07-22 NOTE — Patient Instructions (Signed)
Thank you for coming in today. Let me know if you do not feel better.  Let me know if your shoulder gets hot an red.  You should feel better soon.  Follow up with me at Sports Medicine starting in July as needed for aches and pains. [id number].

## 2011-07-23 ENCOUNTER — Encounter: Payer: Self-pay | Admitting: Family Medicine

## 2011-07-23 DIAGNOSIS — M19019 Primary osteoarthritis, unspecified shoulder: Secondary | ICD-10-CM | POA: Insufficient documentation

## 2011-07-23 NOTE — Assessment & Plan Note (Signed)
Patient is here today for ultrasound guided a.c. joint injection.  This was previously diagnosed.   Patient tolerated procedure well and will followup if not improving with sports medicine clinic for second attempt.   Discussed warning signs and symptoms for infection. Please see discharge instructions

## 2011-07-23 NOTE — Progress Notes (Signed)
Anna Berry is a 51 y.o. female who presents to [facility] today for injection of left a.c. joint for a.c. joint arthritis.   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  She feels well notes pain with crossover.  Please see previous office notes for further description of the quality and quantity of her pain.      Exam:  BP 129/86  Pulse 90  Ht 5' 6.5" (1.689 m)  Wt 189 lb 8 oz (85.957 kg)  BMI 30.13 kg/m2 Gen: Well NAD LEFT SHOULDER: Tender on a.c. joint and pain with crossover.   Ultrasound examination: Of a.c. joint shows positive mushroomed sign and small calcifications within the joint.  Procedure note:  Consent obtained and timeout performed.  Area located on ultrasound and marked with an indentation in the skin.  The skin was cleaned with alcohol.  Will visualize in the a.c. joint a 25-gauge 1.5 inch needle was inserted into the a.c. Joint.  A small amount of lidocaine and Kenalog was injected and visualized distended capsule of the a.c. Joint.  2 ml continue one milliliter 1% lidocaine without epinephrine and 40 mg of Kenalog was injected into the joint under ultrasound guidance.  No contamination of the sterile field or bleeding noted.  Patient tolerated the procedure well.  No results found for this or any previous visit (from the past 72 hour(s)).

## 2012-05-11 ENCOUNTER — Encounter: Payer: Self-pay | Admitting: Family Medicine

## 2012-05-11 ENCOUNTER — Ambulatory Visit (INDEPENDENT_AMBULATORY_CARE_PROVIDER_SITE_OTHER): Payer: 59 | Admitting: Family Medicine

## 2012-05-11 VITALS — BP 126/79 | HR 76 | Temp 98.5°F | Ht 66.0 in | Wt 189.4 lb

## 2012-05-11 DIAGNOSIS — J45909 Unspecified asthma, uncomplicated: Secondary | ICD-10-CM

## 2012-05-11 DIAGNOSIS — R079 Chest pain, unspecified: Secondary | ICD-10-CM

## 2012-05-11 DIAGNOSIS — Z0000 Encounter for general adult medical examination without abnormal findings: Secondary | ICD-10-CM | POA: Insufficient documentation

## 2012-05-11 DIAGNOSIS — N172 Acute kidney failure with medullary necrosis: Secondary | ICD-10-CM

## 2012-05-11 DIAGNOSIS — F32A Depression, unspecified: Secondary | ICD-10-CM

## 2012-05-11 DIAGNOSIS — F329 Major depressive disorder, single episode, unspecified: Secondary | ICD-10-CM

## 2012-05-11 DIAGNOSIS — M25569 Pain in unspecified knee: Secondary | ICD-10-CM

## 2012-05-11 DIAGNOSIS — N12 Tubulo-interstitial nephritis, not specified as acute or chronic: Secondary | ICD-10-CM

## 2012-05-11 DIAGNOSIS — M25561 Pain in right knee: Secondary | ICD-10-CM

## 2012-05-11 DIAGNOSIS — E669 Obesity, unspecified: Secondary | ICD-10-CM

## 2012-05-11 MED ORDER — ALBUTEROL SULFATE HFA 108 (90 BASE) MCG/ACT IN AERS: 2.0000 | INHALATION_SPRAY | RESPIRATORY_TRACT | Status: DC | PRN

## 2012-05-11 NOTE — Assessment & Plan Note (Signed)
Pt to schedule mammography. Not due for colonoscopy for five years. Pt also to see dentist and optometrist. Declined flu shot, today. Encouraged good follow-up both here at [facility] and with other providers, as above.

## 2012-05-11 NOTE — Assessment & Plan Note (Addendum)
A: No current symptoms. At one point was on low-dose glucocorticoid but has not taken it in >1 year. Reports most symptoms of SOB "only during allergy season."   P: Refilled albuterol inhaler per pt request. Continue Claritin OTC as needed. Encouraged to follow up if symptoms increase/persist.

## 2012-05-11 NOTE — Assessment & Plan Note (Signed)
A: Known cause of occasional hematuria. No current hematuria or dysuria. Recent episode of blood in urine for a few days about a month ago.  P: Encouraged good continued follow-up with Dr. Kathrene Bongo (next appointment in about 5 months, per pt). Follow up with me as needed.

## 2012-05-11 NOTE — Patient Instructions (Signed)
Thank you for coming in, today! It was nice to meet you. Make sure you keep your appointments with your kidney doctor, the dentist, and the eye doctor. Make an appointment to come back to see me in a couple of months. We'll check a "random blood sugar" today. If it's high, you can make an appointment to see me sooner to talk about blood sugars more. We'll discuss your chest pain again, next time I see you, as well. If you have more chest pain, come back to see me sooner. Please feel free to call with questions or concerns at any time. --Dr. Casper Harrison

## 2012-05-11 NOTE — Assessment & Plan Note (Signed)
A: No current complaints; pt states she feels well. Does indicate that she had improvement with Zoloft but has not been taking it in "probably more than a year." Pt also states that she had significant emotional/social stressors around that same time.  P: No plans to restart SSRI currently, as pt does not appear clinically depressed. Instructed pt to contact me if any further symptoms develop. Will follow up and screen, routinely.

## 2012-05-11 NOTE — Assessment & Plan Note (Signed)
Pt counseled on diet/exercise. Reports FH of diabetes. CBG in clinic today 79, about 4 hours since last meal. Will continue to follow up with counseling, etc, routinely.

## 2012-05-11 NOTE — Progress Notes (Signed)
Subjective:    Patient ID: Anna Berry, female    DOB: [birth year], 52 y.o.   MRN: [id number]  HPI: Pt presents to clinic for general yearly follow-up. Pt has an extensive PMH including sickle trait, chronic arthritis of multiple sites, renal papillary necrosis and hematuria, obesity, etc. Pt has no specific complaints currently.  Chest pain: Pt does complain of occasional chest discomfort; pt states the last time was about two weeks ago right before bed. Pain is stated to be hard to describe; occasionally some pressure and "sharp pain." Pt does not identify any specific triggers and cannot relate pain with activity due to infrequency.  Arthritis: Pt has long-standing knee and shoulder arthritis. 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 Pt avoids NSAIDs due to her kidneys (see below). Pt soaks in hot baths with Epsom salts and occasionally uses Aspercream occasionally with relief. Pt also uses an elastic brace/band, as well, with relief.  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 Pt had some bleeding in urine for a couple of days about one month ago; she saw her kidney doctor (Dr. Kathrene Bongo), but did not have an infection. She follows up regularly with them about every six months. No current complaints of dysuria or blood in urine.  Asthma: Intermittent symptoms, mostly during allergy season. Pt requests refill on albuterol inhaler; reports she uses it "only occasionally, in allergy season." Reports she takes Claritin daily during allergy season. No current symptoms; no runny nose/itchy eyes, no SOB or wheezing.  Depression: Pt reports she was previously on Zoloft prescribed by Dr. Denyse Amass; around that time, her daughter was in a very unsafe/unstable relationship and was living away from home, and pt was dealing with her ill husband who had been diagnosed  with cancer. She does state she had improvement with the Zoloft but hasn't taken it in "probably more than a year," and does not currently feel depressed.  Health maintenance:  Pt reports she did not have a mammogram last year but is planning to get one this year.  Pt reports she had a colonoscopy last year and had 3 polyps removed, and will be due for a repeat colonoscopy in 5 years.  No flu shot this past year. Declines today. Pt does report some diagnosed "gum disease" but reports she is going to see the dentist soon; pt has not been seen at a dentist in "a couple of years."  Pt plans on seeing an optometrist, as well.  Pt does report some burning in her toes and requests to be screened for diabetes. She does report a FH of diabetes.  SH: Never-smoker. Pt does not drink and denies any illicit drugs as well. Pt lives with husband who has myeloma. Pt has two grown children who live in [place].  Review of Systems: As above.      Objective:   Physical Exam BP 126/79  Pulse 76  Temp(Src) 98.5 F (36.9 C) (Oral)  Ht 5\' 6"  (1.676 m)  Wt 189 lb 6 oz (85.9 kg)  BMI 30.58 kg/m2 Gen: well-appearing adult female, very pleasant and cooperative with interview/exam HEENT: Doyle/AT, MMM, TM's clear bilaterally; right external ear canal with some excoriation and thick wax but no frank drainage or swelling Cardio: RRR, no murmur appreciated Pulm: CTAB, no wheezes, normal WOB, good bilateral air movement Abd: soft, nontender, BS+ Ext:  moves all extremities equally, gait normal, no focal deficits Feet: bilateral feet without rash or obvious lesions  DP pulses 1-2+ and symmetric  Monofilament test normal bilaterally in great, 3rd, and fifth toes as well as forefoot/midfoot/heel and lateral foot     Assessment & Plan:

## 2012-05-11 NOTE — Assessment & Plan Note (Signed)
A: Chronic pain with occasional swelling; currently wearing brace and using OTC creams with good relief. Hx of injections but no great/new complaints.  P: Continue topical cream/bracing as needed. Encouraged good exercise and dietary habits. Will follow up and consider repeat injection in the future, and/or referral to sports medicine, if needed.

## 2012-05-11 NOTE — Addendum Note (Signed)
Addended by: Bobbye Morton on: 05/11/2012 10:21 PM   Modules accepted: Orders

## 2012-07-21 ENCOUNTER — Other Ambulatory Visit: Payer: Self-pay

## 2012-07-21 ENCOUNTER — Telehealth: Payer: Self-pay | Admitting: Family Medicine

## 2012-07-21 DIAGNOSIS — Z1231 Encounter for screening mammogram for malignant neoplasm of breast: Secondary | ICD-10-CM

## 2012-07-21 NOTE — Telephone Encounter (Signed)
Patient is calling because she is having a lot of problems with allergies.  She takes Claritin at night every day and that used to work, but it isn't working now.  Her face and head really hurt and with her Kidney problems, she isn't sure what else she can take.

## 2012-07-21 NOTE — Telephone Encounter (Signed)
Pt states that she is having sinus pain and pressure in her face and believes it to be related to allergies. Suggested continuing Claritin during the day and taking Benadryl at night for a few days. Hot showers, increase fluids, warm compresses to face, use A/C instead of having windows down ll given as suggestions. Encouraged to call with any further questions, pt verbalized understanding. Wyatt Haste, RN-BSN

## 2012-08-25 ENCOUNTER — Ambulatory Visit
Admission: RE | Admit: 2012-08-25 | Discharge: 2012-08-25 | Disposition: A | Discharge status: Home / Self Care | Payer: 59 | Source: Ambulatory Visit

## 2012-08-25 DIAGNOSIS — Z1231 Encounter for screening mammogram for malignant neoplasm of breast: Secondary | ICD-10-CM

## 2013-03-01 ENCOUNTER — Emergency Department (HOSPITAL_COMMUNITY)
Admission: EM | Admit: 2013-03-01 | Discharge: 2013-03-01 | Disposition: A | Discharge status: Home / Self Care | Payer: Worker's Compensation | Attending: Emergency Medicine | Admitting: Emergency Medicine

## 2013-03-01 ENCOUNTER — Emergency Department (HOSPITAL_COMMUNITY): Payer: Worker's Compensation

## 2013-03-01 ENCOUNTER — Encounter (HOSPITAL_COMMUNITY): Payer: Self-pay | Admitting: Emergency Medicine

## 2013-03-01 DIAGNOSIS — Z87448 Personal history of other diseases of urinary system: Secondary | ICD-10-CM | POA: Insufficient documentation

## 2013-03-01 DIAGNOSIS — R079 Chest pain, unspecified: Secondary | ICD-10-CM

## 2013-03-01 DIAGNOSIS — Z8742 Personal history of other diseases of the female genital tract: Secondary | ICD-10-CM | POA: Insufficient documentation

## 2013-03-01 DIAGNOSIS — E669 Obesity, unspecified: Secondary | ICD-10-CM | POA: Insufficient documentation

## 2013-03-01 DIAGNOSIS — IMO0002 Reserved for concepts with insufficient information to code with codable children: Secondary | ICD-10-CM | POA: Insufficient documentation

## 2013-03-01 DIAGNOSIS — R0789 Other chest pain: Secondary | ICD-10-CM | POA: Insufficient documentation

## 2013-03-01 DIAGNOSIS — Z79899 Other long term (current) drug therapy: Secondary | ICD-10-CM | POA: Insufficient documentation

## 2013-03-01 DIAGNOSIS — Z8739 Personal history of other diseases of the musculoskeletal system and connective tissue: Secondary | ICD-10-CM | POA: Insufficient documentation

## 2013-03-01 DIAGNOSIS — Z862 Personal history of diseases of the blood and blood-forming organs and certain disorders involving the immune mechanism: Secondary | ICD-10-CM | POA: Insufficient documentation

## 2013-03-01 DIAGNOSIS — J45909 Unspecified asthma, uncomplicated: Secondary | ICD-10-CM | POA: Insufficient documentation

## 2013-03-01 LAB — CBC WITH DIFFERENTIAL/PLATELET
Basophils Absolute: 0 10*3/uL (ref 0.0–0.1)
Basophils Relative: 0 % (ref 0–1)
Eosinophils Absolute: 0.4 10*3/uL (ref 0.0–0.7)
HCT: 41.5 % (ref 36.0–46.0)
Hemoglobin: 13.9 g/dL (ref 12.0–15.0)
MCH: 29.1 pg (ref 26.0–34.0)
MCHC: 33.5 g/dL (ref 30.0–36.0)
Monocytes Relative: 8 % (ref 3–12)
Neutro Abs: 2.3 10*3/uL (ref 1.7–7.7)
Neutrophils Relative %: 48 % (ref 43–77)
Platelets: 208 10*3/uL (ref 150–400)
WBC: 4.9 10*3/uL (ref 4.0–10.5)

## 2013-03-01 LAB — BASIC METABOLIC PANEL
Calcium: 9.3 mg/dL (ref 8.4–10.5)
Chloride: 107 mEq/L (ref 96–112)
GFR calc Af Amer: 86 mL/min — ABNORMAL LOW (ref >90)
GFR calc non Af Amer: 74 mL/min — ABNORMAL LOW (ref >90)
Potassium: 3.9 mEq/L (ref 3.5–5.1)
Sodium: 144 mEq/L (ref 135–145)

## 2013-03-01 LAB — POCT I-STAT TROPONIN I: Troponin i, poc: 0.01 ng/mL (ref 0.00–0.08)

## 2013-03-01 NOTE — ED Notes (Signed)
Family at bedside. 

## 2013-03-01 NOTE — ED Notes (Signed)
Pt c/o mid sternal to left sided CP starting this am while in shower; pt sts worse with movement and inspiration; pt sts recent stress at work and has HA at present

## 2013-03-01 NOTE — ED Notes (Signed)
Patient transported to X-ray 

## 2013-03-01 NOTE — ED Provider Notes (Signed)
CSN: [id number]     Arrival date & time 03/01/13  0757 History   First MD Initiated Contact with Patient 03/01/13 [phone]     Chief Complaint  Patient presents with  . Chest Pain   (Consider location/radiation/quality/duration/timing/severity/associated sxs/prior Treatment) The history is provided by medical records and the patient.   This is a 52 year old female with past medical history significant for renal papillary necrosis, asthma, presenting to the ED for chest pain. Patient states pain was of sudden onset this morning while she was in the shower. Pain localized to the left anterior chest, described as a sharp, stabbing sensation with radiation to her back.  Symptoms worse with movement and deep breathing. Denies associated radiation into extremities or neck, diaphoresis, shortness of breath, palpitations, dizziness, or weakness.  Patient denies personal or family history of CAD or MI. She does admit to being under increased stress at work-- works at [employer] and has been working long hours lately.  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  No recent travel, surgeries, LE edema, or calf pain.  No prior hx of DVT or PE.  VS stable on arrival.  Past Medical History  Diagnosis Date  . Renal papillary necrosis   . Obesity   . Sickle cell trait   . Postmenopausal   . Plantar fasciitis   . Asthma     allergies  . Allergy    Past Surgical History  Procedure Laterality Date  . Abdominal hysterectomy    . Appendectomy    . Cesarean section      x2   Family History  Problem Relation Age of Onset  . Stomach cancer Maternal Grandmother 50  . Stroke Maternal Grandmother   . Diabetes Father   . Kidney disease Father    History  Substance Use Topics  . Smoking status: Never Smoker   . Smokeless tobacco: Never Used  . Alcohol Use: No   OB History   Grav Para Term Preterm Abortions TAB SAB Ect Mult Living                  Review of Systems  Cardiovascular: Positive for chest pain.  All other systems reviewed and are negative.    Allergies  Acetaminophen and Nsaids  Home Medications   Current Outpatient Rx  Name  Route  Sig  Dispense  Refill  . albuterol (VENTOLIN HFA) 108 (90 BASE) MCG/ACT inhaler   Inhalation   Inhale 2 puffs into the lungs every 4 (four) hours as needed.   1 Inhaler   11   . Calcium-Vitamin D (CALTRATE 600 PLUS-VIT D PO)   Oral   Take 1 tablet by mouth daily.           Marland Kitchen EXPIRED: fluticasone (FLOVENT HFA) 110 MCG/ACT inhaler   Inhalation   Inhale 2 puffs into the lungs 2 (two) times daily.   1 Inhaler   2   . loratadine (CLARITIN) 10 MG tablet   Oral   Take 10 mg by mouth daily.           . Multiple Vitamins-Minerals (MULTIVITAMIN WITH MINERALS) tablet   Oral   Take 1 tablet by mouth daily.           Marland Kitchen EXPIRED: sertraline (ZOLOFT) 25 MG tablet   Oral   Take 1 tablet (25 mg total) by mouth daily.   30 tablet   1   .  traMADol (ULTRAM) 50 MG tablet   Oral   Take 50 mg by mouth every 4 (four) hours as needed.            BP 153/79  Pulse 64  Temp(Src) 98 F (36.7 C) (Oral)  Resp 18  SpO2 100%  Physical Exam  Nursing note and vitals reviewed. Constitutional: She is oriented to [place], place, and time. She appears well-developed and well-nourished. No distress.  HENT:  Head: Normocephalic and atraumatic.  Mouth/Throat: Oropharynx is clear and moist.  Eyes: Conjunctivae and EOM are normal. Pupils are equal, round, and reactive to light.  Neck: Normal range of motion.  Cardiovascular: Normal rate, regular rhythm and normal heart sounds.   Pulmonary/Chest: Effort normal and breath sounds normal. No respiratory distress. She has no wheezes.  Chest wall non-tender  Abdominal: Soft. Bowel sounds are normal. There is no tenderness. There is no guarding.  Musculoskeletal: Normal range of motion. She exhibits no edema.  Neurological: She is alert and  oriented to [place], place, and time.  Skin: Skin is warm and dry. She is not diaphoretic.  Psychiatric: She has a normal mood and affect.    ED Course  Procedures (including critical care time) Labs Review Labs Reviewed  CBC WITH DIFFERENTIAL - Abnormal; Notable for the following:    Eosinophils Relative 8 (*)    All other components within normal limits  BASIC METABOLIC PANEL - Abnormal; Notable for the following:    GFR calc non Af Amer 74 (*)    GFR calc Af Amer 86 (*)    All other components within normal limits  POCT I-STAT TROPONIN I   Imaging Review Dg Chest 2 View  03/01/2013   CLINICAL DATA:  Chest pain  EXAM: CHEST  2 VIEW  COMPARISON:  None.  FINDINGS: The lungs are clear. Heart size and pulmonary vascularity are normal. No adenopathy. No pneumothorax. No bone lesions.  IMPRESSION: No abnormality noted.   Electronically Signed   By: Bretta Bang M.D.   On: 03/01/2013 08:29    EKG Interpretation    Date/Time:  Wednesday March 01 2013 08:00:34 EST Ventricular Rate:  66 PR Interval:  174 QRS Duration: 94 QT Interval:  382 QTC Calculation: 400 R Axis:   88 Text Interpretation:  Normal sinus rhythm Normal ECG Confirmed by POLLINA  MD, CHRISTOPHER ([id number]) on 03/01/2013 8:38:39 AM            MDM   1. Chest pain    EKG NSR, no acute ischemic changes.  Trop negative.  CXR clear.  Labs reassuring.  Pts pain reproducible with movement of LUE and appears MSK in nature.  Pt has no prior cardiac hx, heart score of 2 putting pt and low risk for MACE.  At this time i have low suspicion for ACS, PE, dissection, or other acute cardiac event.  Stress and recent strenuous activity at work may be playing a role.  I have advised pt to take the day off to rest, continue to monitor sx closely.  FU with PCP.  Strict return precautions advised for new or worsening symptoms including new chest pain, SOB, palpitations, diaphoresis, dizziness, weakness, etc-- pt and husband at  beside acknowledged understanding and agreed.  VS stable at time of discharge.  Discussed with Dr. Blinda Leatherwood who agrees with assessment and plan of care.  Garlon Hatchet, PA-C 03/01/13 1352

## 2013-03-03 NOTE — ED Provider Notes (Signed)
Medical screening examination/treatment/procedure(s) were performed by non-physician practitioner and as supervising physician I was immediately available for consultation/collaboration.  EKG Interpretation   None          Gilda Crease, MD 03/03/13 1359

## 2013-04-24 ENCOUNTER — Encounter: Payer: Self-pay | Admitting: Family Medicine

## 2013-04-24 ENCOUNTER — Ambulatory Visit (HOSPITAL_COMMUNITY)
Admission: RE | Admit: 2013-04-24 | Discharge: 2013-04-24 | Disposition: A | Discharge status: Home / Self Care | Payer: 59 | Source: Ambulatory Visit | Attending: Family Medicine | Admitting: Family Medicine

## 2013-04-24 ENCOUNTER — Other Ambulatory Visit: Payer: Self-pay | Admitting: Family Medicine

## 2013-04-24 ENCOUNTER — Ambulatory Visit (INDEPENDENT_AMBULATORY_CARE_PROVIDER_SITE_OTHER): Payer: 59 | Admitting: Family Medicine

## 2013-04-24 VITALS — BP 130/80 | HR 84 | Ht 66.0 in | Wt 188.0 lb

## 2013-04-24 DIAGNOSIS — M25561 Pain in right knee: Secondary | ICD-10-CM

## 2013-04-24 DIAGNOSIS — M25569 Pain in unspecified knee: Secondary | ICD-10-CM

## 2013-04-24 NOTE — Assessment & Plan Note (Signed)
Patient presents for evaluation of right knee pain that is of unclear etiology. Suspect IT band tightness versus osteoarthritis of the knee as the cause of her pain. The knee appears stable based on ligament testing. -Will check standing AP, lateral, sunrise view of right knee to evaluate for osteoarthritis -Conservative management discussed including daily icing, compression, elevation -Patient unable to tolerate NSAIDs due to the history of kidney disease, patient currently taking tramadol that was previously prescribed and is controlling her symptoms, patient may continue to take these as needed

## 2013-04-24 NOTE — Patient Instructions (Signed)
Knee Pain Knee pain can be a result of an injury or other medical conditions. Treatment will depend on the cause of your pain. HOME CARE  Only take medicine as told by your doctor.  Keep a healthy weight. Being overweight can make the knee hurt more.  Stretch before exercising or playing sports.  If there is constant knee pain, change the way you exercise. Ask your doctor for advice.  Make sure shoes fit well. Choose the right shoe for the sport or activity.  Protect your knees. Wear kneepads if needed.  Rest when you are tired. GET HELP RIGHT AWAY IF:   Your knee pain does not stop.  Your knee pain does not get better.  Your knee joint feels hot to the touch.  You have a fever. MAKE SURE YOU:   Understand these instructions.  Will watch this condition.  Will get help right away if you are not doing well or get worse. Document Released: 05/29/2008 Document Revised: 05/25/2011 Document Reviewed: 05/29/2008 [facility] Patient Information 2014 [place], [place].   Ice your knee 2-3 times daily for 15 minutes, may take Tramadol as needed for pain, have xrays completed of right knee, wear compression bandage as needed (Neoprene), continue gentle range of motion exercises

## 2013-04-24 NOTE — Progress Notes (Signed)
   Subjective:    Patient ID: Anna Berry, female    DOB: 13-Dec-1960, 53 y.o.   MRN: [id number]  HPI 53 year old [place] American female presents for evaluation of right knee pain, patient has had intermittent bouts of right knee pain over the past 2 years which have previously been relieved with conservative management, patient did require steroid injection a few years ago which provided her approximately 6 months of relief, patient reports worsening of her symptoms over the past 4-5 days, patient states that she went line dancing prior to the initiation of her pain however denies injury at that time, pain is located primarily over the lateral aspect of her right knee with some radiation up and down the lateral aspect of her leg, the swelling of the leg, no erythema, patient has pain noted over the lateral aspect of her leg with flexion and extension, she wears compression bandage daily, no numbness or tingling noted in her lower extremities, she does admit to occasional locking and clicking of her bilateral knees, her right knee occasionally gives way however she has not fallen   Review of Systems  Musculoskeletal: Positive for arthralgias. Negative for back pain, joint swelling and myalgias.       Objective:   Physical Exam Vitals: Reviewed General: Pleasant African American female, no acute distress MSK: Right knee-no joint line tenderness noted, no patellar tendon tenderness noted, patient denies tenderness over the lateral knee including Gerdys tubercle, IT band, and LCL ligament. Flexion of the knee was limited to pain in the lateral knee however she was able to fully flex and extend her knee, no swelling noted, ligament testing including Lachman, varus stress, and valgus stress were within normal limits, McMurray's testing negative for pain or locking, no Baker's cyst was present; right hip: Logroll test negative, FABIR and FADIR testing unremarkable; Obers test negative  bilaterally Neuro: Strength testing of bilateral legs was 5 out of 5 to knee flexion and extension as well as dorsi flexion and plantar flexion of the ankles       Assessment & Plan:  Please see problem specific assessment and plan.

## 2013-04-25 ENCOUNTER — Telehealth: Payer: Self-pay | Admitting: Family Medicine

## 2013-04-25 NOTE — Telephone Encounter (Signed)
Returned call about xray results

## 2013-04-25 NOTE — Telephone Encounter (Signed)
Left voicemail about xray results.

## 2013-04-26 NOTE — Telephone Encounter (Signed)
Spoke to her husband about the xray results, patient to return to office for steroid injection if pain persists.

## 2013-04-27 ENCOUNTER — Ambulatory Visit: Payer: Self-pay

## 2013-05-02 ENCOUNTER — Ambulatory Visit: Payer: Self-pay | Admitting: Family Medicine

## 2013-05-09 ENCOUNTER — Encounter: Payer: Self-pay | Admitting: Family Medicine

## 2013-05-09 ENCOUNTER — Ambulatory Visit (INDEPENDENT_AMBULATORY_CARE_PROVIDER_SITE_OTHER): Payer: 59 | Admitting: Family Medicine

## 2013-05-09 VITALS — BP 126/90 | HR 74 | Temp 98.5°F | Ht 69.0 in | Wt 187.0 lb

## 2013-05-09 DIAGNOSIS — M25569 Pain in unspecified knee: Secondary | ICD-10-CM

## 2013-05-09 DIAGNOSIS — IMO0002 Reserved for concepts with insufficient information to code with codable children: Secondary | ICD-10-CM

## 2013-05-09 DIAGNOSIS — M1710 Unilateral primary osteoarthritis, unspecified knee: Secondary | ICD-10-CM | POA: Insufficient documentation

## 2013-05-09 DIAGNOSIS — M25561 Pain in right knee: Secondary | ICD-10-CM

## 2013-05-09 MED ORDER — METHYLPREDNISOLONE ACETATE 40 MG/ML IJ SUSP: 40.0000 mg | Freq: Once | INTRAMUSCULAR | Status: DC

## 2013-05-09 NOTE — Progress Notes (Signed)
   Subjective:    Patient ID: Anna Berry, female    DOB: 1960/07/12, 53 y.o.   MRN: [id number]  HPI: Pt presents to clinic for right knee cortisol injection for knee pain most likely due to degenerative disease / arthritis. No significant change in symptoms from recent visit with Dr. Ree Kida. Pt denies systemic signs / symptoms of infection and generally feels well. RICE therapy for her knee has been only partially helpful for her pain. Cortisol injection has helped in the past and pt wishes to try this again.  Review of Systems: As above.     Objective:   Physical Exam BP 126/90  Pulse 74  Temp(Src) 98.5 F (36.9 C) (Oral)  Ht 5\' 9"  (1.753 m)  Wt 187 lb (84.823 kg)  BMI 27.60 kg/m2 General: well-appearing adult female in NAD MSK: bilateral knees without significant effusion and no frank joint deformity  Crepitus to palpation with patellar compression and passive ROM through full arc of knee, R > L  No significant joint line tenderness to either knee and no gross ligamentous instability  Full ROM passively and actively through both knees Neurovascular: strength 5/5 in both LE through knee flexion / extension and plantar- / dorsiflexion  Sensation grossly intact throughout both LE, distal pulses palpable/symmetric Skin: no redness, ulceration, rash, or other lesions of either LE     Assessment & Plan:  Right knee tricompartmental degenerative disease without significant relief of pain with conservative measures. Injection with Depo-Medrol and lidocaine performed today (see procedure note below). Pt instructed to f/u as needed. Provided number for [facility] to follow up there if steroid shot does not help, for consideration of MSK U/S, more advanced imaging, viscotherapy, etc, as appropriate.  RIGHT Knee Injection: Consent obtained and verified, signed and to be scanned into chart. Time-out conducted. Noted no overlying erythema, induration, or other signs of local infection. Skin  prepped in a sterile fashion with iodine and alcohol. Topical analgesic spray: Ethyl chloride. Joint: RIGHT knee, anterolateral approach Needle: 25 gauge, 1.5 inch Completed without difficulty. Pt tolerated procedure well with no significant blood loss. Meds: Depo-Medrol 40 mg + 4 mL 1% lidocaine. Advised to call if fevers/chills, erythema, induration, drainage, or persistent bleeding.

## 2013-05-09 NOTE — Patient Instructions (Signed)
Thank you for coming in, today!  We injected your knee with a steroid today. Ice your knee for 20 minutes when you get home, then another 20 minutes before bed. You can use ice on your knees every day several times a day, for 20 minutes at a time with at least an hour between times. The shot should help with your pain for at least several days to a few weeks. If it does not help your pain, or if your pain gets worse, call the sports medicine clinic to be set up for an appointment there. Their number is [phone].  Call the clinic or go to the emergency room if you have redness or swelling in the skin around or in the knee itself, severe pain, fever / chills, or nausea / vomiting. These can be signs of an infection.  Please feel free to call with any questions or concerns at any time, at [phone]. --Dr. Venetia Maxon

## 2013-07-17 ENCOUNTER — Telehealth: Payer: Self-pay | Admitting: Family Medicine

## 2013-07-17 MED ORDER — ALBUTEROL SULFATE HFA 108 (90 BASE) MCG/ACT IN AERS: 2.0000 | INHALATION_SPRAY | Freq: Four times a day (QID) | RESPIRATORY_TRACT | Status: DC | PRN

## 2013-07-17 NOTE — Telephone Encounter (Signed)
Rx ordered with 2 refills. Will need office visit for further refills. Thanks! --CMS

## 2013-07-17 NOTE — Telephone Encounter (Signed)
Pt called and needs a refill on her inhaler called in. jw °

## 2013-08-16 ENCOUNTER — Encounter: Payer: Self-pay | Admitting: Family Medicine

## 2013-08-24 ENCOUNTER — Ambulatory Visit (INDEPENDENT_AMBULATORY_CARE_PROVIDER_SITE_OTHER): Payer: 59 | Admitting: Family Medicine

## 2013-08-24 ENCOUNTER — Encounter: Payer: Self-pay | Admitting: Family Medicine

## 2013-08-24 VITALS — BP 109/70 | HR 86 | Temp 98.4°F | Resp 20 | Ht 66.54 in | Wt 184.0 lb

## 2013-08-24 DIAGNOSIS — Z6829 Body mass index (BMI) 29.0-29.9, adult: Secondary | ICD-10-CM | POA: Insufficient documentation

## 2013-08-24 DIAGNOSIS — E669 Obesity, unspecified: Secondary | ICD-10-CM

## 2013-08-24 DIAGNOSIS — Z0000 Encounter for general adult medical examination without abnormal findings: Secondary | ICD-10-CM

## 2013-08-24 DIAGNOSIS — D573 Sickle-cell trait: Secondary | ICD-10-CM

## 2013-08-24 LAB — CBC
HEMATOCRIT: 38.6 % (ref 36.0–46.0)
HEMOGLOBIN: 13.1 g/dL (ref 12.0–15.0)
MCH: 28.2 pg (ref 26.0–34.0)
MCHC: 33.9 g/dL (ref 30.0–36.0)
MCV: 83 fL (ref 78.0–100.0)
Platelets: 256 10*3/uL (ref 150–400)
RBC: 4.65 MIL/uL (ref 3.87–5.11)
RDW: 15.7 % — AB (ref 11.5–15.5)
WBC: 6.6 10*3/uL (ref 4.0–10.5)

## 2013-08-24 NOTE — Assessment & Plan Note (Signed)
ADDENDUM 08/24/13: On chart review, this A&P note found to have been entered as a problem list overview rather than as part of the progress note. Added to pt's chart for completion; problem now resolved / not active.  A: Very vague, nonspecific complaint, and irregular/intermittent. Not definitely cardiac, but could represent angina; difficult to assess due to relative rarity (last episode 2 weeks ago, pt not sure of timing of previous episode before that) and the fact that pt is unable to identify triggers or associate with activity (or not).  P: Will plan to follow up with pt and readdress specifically at that time. No current medications or definite plans for lab work, but will try to specific complaints/triggers/associations better and proceed with any work-up as appropriate.

## 2013-08-24 NOTE — Progress Notes (Signed)
Subjective:    Patient ID: Anna Berry, female    DOB: 09-26-1960, 53 y.o.   MRN: [id number]  HPI: Pt presents to clinic for her annual physical exam. Current complaints include chronic knee and ankle arthritis, not worse than normal. Pt uses an inhaler for allergies sometimes, but not for frank asthma. She takes only Claritin as needed otherwise. 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 She has had a hysterectomy and does not need pap smears. She is up to date on her colonoscopy but does need a mammogram this year.  Family History  Problem Relation Age of Onset  . Stomach cancer Maternal Grandmother 78  . Stroke Maternal Grandmother   . Diabetes Father   . Kidney disease Father     Past Medical History  Diagnosis Date  . Renal papillary necrosis   . Obesity   . Sickle cell trait   . Postmenopausal   . Plantar fasciitis   . Asthma     allergies  . Allergy     Past Surgical History  Procedure Laterality Date  . Abdominal hysterectomy    . Appendectomy    . Cesarean section      x2    History   Social History  . Marital Status: Married    Spouse Name: N/A    Number of Children: N/A  . Years of Education: N/A   Occupational History  . Not on file.   Social History Main Topics  . Smoking status: Never Smoker   . Smokeless tobacco: Never Used  . Alcohol Use: No  . Drug Use: No  . Sexual Activity: Yes   Other Topics Concern  . Not on file   Social History Narrative  . No narrative on file   In addition to the above documentation, pt's PMH, surgical history, FH, and SH all reviewed and updated where appropriate in the EMR. I have also reviewed and updated the pt's allergies and current medications as appropriate.  Review of Systems: As above. Otherwise, full 12-system ROS was reviewed and all negative.     Objective:   Physical Exam BP 109/70  Pulse 86  Temp(Src) 98.4 F (36.9 C) (Oral)  Resp 20  Ht 5' 6.53" (1.69 m)  Wt 184 lb (83.462  kg)  BMI 29.22 kg/m2  SpO2 95% Gen: well-appearing adult female in NAD HEENT: [place]/AT, sclerae/conjunctivae clear, no lid lag, EOMI, PERRLA   MMM, posterior oropharynx clear, no cervical lymphadenopathy  neck supple with full ROM, no masses appreciated; thyroid not enlarged  Cardio: RRR, no murmur appreciated; distal pulses intact/symmetric Pulm: CTAB, no wheezes, normal WOB  Abd: soft, nondistended, BS+, no HSM Ext: warm/well-perfused, no cyanosis/clubbing/edema MSK: strength 5/5 in all four extremities, no frank joint deformity/effusion  normal ROM to all four extremities with no point muscle/bony tenderness in spine  Bilateral knees without frank effusion, joint line tenderness, or gross joint instability Neuro/Psych: alert/oriented, sensation grossly intact; normal gait/balance  mood euthymic with congruent affect     Assessment & Plan:  53yo female in general good health. Hx sickle trait, knee arthritis.  Anticipatory guidance / risk factor reduction - suggested [email or web address] as a resource for weight / diet control improvements - advised weight loss / maintenance of healthy weight to help with BP / glucose control, etc - advised diet and exercise will both help with medical as well as physical problems such as knee arthritis  Immunization / screening / ancillary studies - s/p  hysterectomy without remaining cervix, so no need for pap - up to date on colonoscopy - advised pt to get screening mammogram (due this year as of tomorrow) - labs drawn today (CBC, lipid panel, CMP)  Follow up in 1 year for next wellness visit, or sooner if needed.  Emmaline Kluver, MD PGY-2, [facility] 08/24/2013, 4:14 PM

## 2013-08-24 NOTE — Patient Instructions (Signed)
Thank you for coming in, today!  Everything looks good today. We will check your cholesterol, liver function, kidney function, and hemoglobin. I will call you if anything is abnormal, or send you a letter if everything is okay.  I will give you information on getting your mammogram. You do NOT need pap smears, but you will need a colonoscopy in about 2 more years.  Working on losing weight will help with arthritis as well as your blood pressure and blood sugar. You can walk 30 minutes a day, five days a week, keep dancing! Biking and swimming are both good as well. You can check out [email or web address] for help with improving your diet.  Come back to see me in about 1 year or sooner if you need. Please feel free to call with any questions or concerns at any time, at [phone]. --Dr. Venetia Maxon

## 2013-08-25 ENCOUNTER — Encounter: Payer: Self-pay | Admitting: Family Medicine

## 2013-08-25 LAB — COMPREHENSIVE METABOLIC PANEL
ALT: 18 U/L (ref 0–35)
AST: 20 U/L (ref 0–37)
Albumin: 4.4 g/dL (ref 3.5–5.2)
Alkaline Phosphatase: 86 U/L (ref 39–117)
BUN: 11 mg/dL (ref 6–23)
CALCIUM: 9.4 mg/dL (ref 8.4–10.5)
CHLORIDE: 104 meq/L (ref 96–112)
CO2: 30 meq/L (ref 19–32)
CREATININE: 0.92 mg/dL (ref 0.50–1.10)
Glucose, Bld: 82 mg/dL (ref 70–99)
POTASSIUM: 3.9 meq/L (ref 3.5–5.3)
SODIUM: 141 meq/L (ref 135–145)
TOTAL PROTEIN: 6.9 g/dL (ref 6.0–8.3)
Total Bilirubin: 0.5 mg/dL (ref 0.2–1.2)

## 2013-08-25 LAB — LIPID PANEL
Cholesterol: 155 mg/dL (ref 0–200)
HDL: 48 mg/dL (ref >39)
LDL CALC: 87 mg/dL (ref 0–99)
TRIGLYCERIDES: 99 mg/dL (ref <150)
Total CHOL/HDL Ratio: 3.2 Ratio
VLDL: 20 mg/dL (ref 0–40)

## 2013-10-16 ENCOUNTER — Other Ambulatory Visit: Payer: Self-pay

## 2013-10-16 DIAGNOSIS — Z1231 Encounter for screening mammogram for malignant neoplasm of breast: Secondary | ICD-10-CM

## 2013-10-17 ENCOUNTER — Ambulatory Visit
Admission: RE | Admit: 2013-10-17 | Discharge: 2013-10-17 | Disposition: A | Discharge status: Home / Self Care | Payer: 59 | Source: Ambulatory Visit

## 2013-10-17 ENCOUNTER — Encounter (INDEPENDENT_AMBULATORY_CARE_PROVIDER_SITE_OTHER): Payer: Self-pay

## 2013-10-17 DIAGNOSIS — Z1231 Encounter for screening mammogram for malignant neoplasm of breast: Secondary | ICD-10-CM

## 2014-03-01 ENCOUNTER — Encounter: Payer: Self-pay | Admitting: Family Medicine

## 2014-03-01 ENCOUNTER — Ambulatory Visit (INDEPENDENT_AMBULATORY_CARE_PROVIDER_SITE_OTHER): Payer: 59 | Admitting: Family Medicine

## 2014-03-01 VITALS — BP 132/85 | HR 76 | Temp 98.5°F | Ht 66.0 in | Wt 183.3 lb

## 2014-03-01 DIAGNOSIS — R195 Other fecal abnormalities: Secondary | ICD-10-CM

## 2014-03-01 DIAGNOSIS — K5900 Constipation, unspecified: Secondary | ICD-10-CM

## 2014-03-01 MED ORDER — POLYETHYLENE GLYCOL 3350 17 GM/SCOOP PO POWD: 17.0000 g | Freq: Two times a day (BID) | ORAL | Status: DC | PRN

## 2014-03-01 NOTE — Patient Instructions (Signed)
Thank you for coming in, today!  I think a lot of your symptoms could be due to constipation. Start taking MiraLAX once a day, every day. This will help your BM's pass easier and might help with your pain. If it helps some but you still have issues, you can take MiraLAX twice per day. If you use it twice a day and have any loose stool, drop back to once per day.  Give Dr. Nichola Sizer office a call, as well. It might be worth seeing her again. If they need a referral for that, let me know, and I'll put it in.  For your shoulder pain, keep trying the exercises you mentioned. You can also call the Sports Medicine clinic at [phone]). They should not need a referral.  Come back to see me as you need, otherwise. Please feel free to call with any questions or concerns at any time, at [phone]. --Dr. Venetia Maxon

## 2014-03-01 NOTE — Progress Notes (Signed)
   Subjective:    Patient ID: Anna Berry, female    DOB: 02/07/1961, 53 y.o.   MRN: [id number]  HPI: Pt presents to clinic for lower abdominal pain in the last week. She had some dark stool with bad odor to it for a couple of days, but that resolved on its own. She has low abdominal pain is described as crampy, mild, achey, that usually gets better after she has a BM. 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 She doesn't identify any other specific triggers for her pain. She denies any particular foods that cause her symptoms; she is lactose intolerant but avoids dairy. She drinks lots of water and states her appetite is normal. She does state she has some constipation on and off. She states she has had a colonoscopy in the past (done by Dr. Olevia Perches) with polyps and was told she needs a repeat every 5 years.  Of note, pt has sickle trait and hx of renal papillary necrosis, and has had blood in her urine "off and on" in the past, but none currently.  Review of Systems: As above. Pt denies very hard stools, frank blood in her stool, fever, N/V.     Objective:   Physical Exam BP 132/85 mmHg  Pulse 76  Temp(Src) 98.5 F (36.9 C) (Oral)  Ht 5\' 6"  (1.676 m)  Wt 183 lb 4.8 oz (83.144 kg)  BMI 29.60 kg/m2 Gen: well-appearing adult female in NAD HEENT: [place]/AT, EOMI, PERRLA, MMM Cardio: RRR, no murmur appreciated Pulm: CTAB, no wheezes, normal WOB Abd: soft, nontender throughout, BS+  No definite organomegaly or masses palpable  No grossly hard / large stool burden palpable through abdominal wall Ext: warm, well-perfused, no LE edema     Assessment & Plan:  53yo with vague / intermittent abdominal pain and dark stools, but no frank bleeding or signs / symptoms suggesting frank anemia - hx of colonic polyps and some symptoms suggestive of constipation / hard stools - possible IBS-type symptoms, especially given apparent worsening with  stress  Plan: - Rx for MiraLAX daily scheduled, titrate up to BID if needed - reviewed reasons to increase or decrease MiraLAX and rationale behind trying this - continue supportive measures otherwise, as well (good hydration, increase fiber in diet, etc) - advised pt to contact Dr. 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

## 2014-06-28 ENCOUNTER — Other Ambulatory Visit: Payer: Self-pay | Admitting: Family Medicine

## 2014-06-28 MED ORDER — ALBUTEROL SULFATE HFA 108 (90 BASE) MCG/ACT IN AERS
2.0000 | INHALATION_SPRAY | Freq: Four times a day (QID) | RESPIRATORY_TRACT | Status: DC | PRN
Start: 2014-06-28 — End: 2015-07-19

## 2014-06-28 NOTE — Telephone Encounter (Signed)
Pt called and needs a refill on her inhaler. jw

## 2014-12-17 ENCOUNTER — Encounter: Payer: Self-pay | Admitting: Family Medicine

## 2015-01-08 ENCOUNTER — Ambulatory Visit (INDEPENDENT_AMBULATORY_CARE_PROVIDER_SITE_OTHER): Payer: 59 | Admitting: Family Medicine

## 2015-01-08 ENCOUNTER — Encounter: Payer: Self-pay | Admitting: Family Medicine

## 2015-01-08 VITALS — BP 120/72 | HR 72 | Temp 98.2°F | Ht 66.0 in | Wt 185.0 lb

## 2015-01-08 DIAGNOSIS — Z0000 Encounter for general adult medical examination without abnormal findings: Secondary | ICD-10-CM

## 2015-01-08 DIAGNOSIS — R5383 Other fatigue: Secondary | ICD-10-CM | POA: Diagnosis not present

## 2015-01-08 LAB — CBC
HCT: 39.1 % (ref 36.0–46.0)
Hemoglobin: 13 g/dL (ref 12.0–15.0)
MCH: 28.5 pg (ref 26.0–34.0)
MCHC: 33.2 g/dL (ref 30.0–36.0)
MCV: 85.7 fL (ref 78.0–100.0)
MPV: 9.4 fL (ref 8.6–12.4)
Platelets: 244 10*3/uL (ref 150–400)
RBC: 4.56 MIL/uL (ref 3.87–5.11)
RDW: 14.9 % (ref 11.5–15.5)
WBC: 7.1 10*3/uL (ref 4.0–10.5)

## 2015-01-08 LAB — POCT GLYCOSYLATED HEMOGLOBIN (HGB A1C): HEMOGLOBIN A1C: 5.7

## 2015-01-08 LAB — TSH: TSH: 0.59 u[IU]/mL (ref 0.350–4.500)

## 2015-01-08 NOTE — Assessment & Plan Note (Signed)
Patient is here for her annual physical exam. No significant issues at this time however she does state that she has had increased amount of flatulence and some occasional fatigue which she reports as mild. - Labs drawn today: CBC, CMP, lipid panel, A1c, TSH. - I will forward today's labs to her nephrologist as they result. (Dr. Corliss Parish) - I will mail her these results.

## 2015-01-08 NOTE — Progress Notes (Signed)
Anna Lynch, MD, MS Phone: [phone]  Subjective:  Chief complaint -- Annual Physical  Pt Here for physical exam Patient is here for her annual physical exam and meeting her new PCP. She does not have any significant changes in her life to report. She states that she has been enterally in good health. She is still being seen by nephrology for her kidney function.   Patient does have some questions about flatulence. She states that she has had an increased amount of this recently and is wondering if there is anything she could do to help prevent this. She denies any issues with incontinence. No recent constipation or diarrhea. No recent travel or diet changes.  Colonoscopy: Not currently due, however colonoscopy will be due in approximately 7 months. Contact information provided at the end of our visit.   Mammogram: no, patient states that she will schedule this seen. Menopause: yes, status post hysterectomy  Vaginal Bleeding: no  Tobacco Use: no   Alcohol Use: no  Other Drugs: no   Support Structure: yes  Life at Home: Stable  ROS- patient denies any fever, chills, headache, dizziness, vertigo, shortness of breath, chest pain, abdominal pain, nausea, vomiting, diarrhea, constipation, numbness, weakness, paresthesias. Endorses some fatigue.  Past Medical History Patient Active Problem List   Diagnosis Date Noted  . BMI 29.0-29.9,adult 08/24/2013  . 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 Health care maintenance 05/11/2012  . Obesity (BMI 30-39.9) 05/11/2012  . 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 Right knee pain 06/22/2011  . Left shoulder pain 06/22/2011  . Left foot pain 06/22/2011  . Fatigue 11/26/2010  . Depression 11/26/2010  . 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 Asthma 12/29/2009  . POSTMENOPAUSAL SYNDROME 08/26/2009  . ARTHRITIS 08/26/2009  . HEMATURIA UNSPECIFIED 05/21/2008  . ALLERGIC RHINITIS 05/29/2007  . SICKLE CELL TRAIT  05/13/2006    Medications- reviewed and updated Current Outpatient Prescriptions  Medication Sig Dispense Refill  . albuterol (PROVENTIL HFA;VENTOLIN HFA) 108 (90 BASE) MCG/ACT inhaler Inhale 2 puffs into the lungs every 6 (six) hours as needed for wheezing or shortness of breath. 1 Inhaler 2  . loratadine (CLARITIN) 10 MG tablet Take 10 mg by mouth daily as needed for allergies.     . polyethylene glycol powder (GLYCOLAX/MIRALAX) powder Take 17 g by mouth 2 (two) times daily as needed. 3350 g 1   No current facility-administered medications for this visit.    Objective: BP 120/72 mmHg  Pulse 72  Temp(Src) 98.2 F (36.8 C) (Oral)  Ht 5\' 6"  (1.676 m)  Wt 185 lb (83.915 kg)  BMI 29.87 kg/m2  SpO2 99% Gen: NAD, alert, cooperative with exam HEENT: NCAT, EOMI, PERRL CV: RRR, good S1/S2, no murmur Resp: CTABL, no wheezes, non-labored Abd: Soft, Non Tender, Non Distended, BS present, no guarding or organomegaly Ext: No edema, warm Neuro: Alert and oriented, No gross deficits   Assessment/Plan:  Health care maintenance Patient is here for her annual physical exam. No significant issues at this time however she does state that she has had increased amount of flatulence and some occasional fatigue which she reports as mild. - Labs drawn today: CBC, CMP, lipid panel, A1c, TSH. - I will forward today's labs to her nephrologist as they result. (Dr. Corliss Parish) - I will mail her these results.    Orders Placed This Encounter  Procedures  . CBC  . Comprehensive metabolic panel  . TSH  . Lipid panel  . POCT glycosylated hemoglobin (  Hb A1C)    Elberta Leatherwood, MD,MS,  PGY2 01/08/2015 6:07 PM

## 2015-01-08 NOTE — Patient Instructions (Signed)
It was a pleasure seeing you today in our clinic. Today we performed your yearly physical. Here is the treatment plan we have discussed and agreed upon together:   - We have drawn labs today. I will forward the results of these labs to your nephrologist. - I've no new medications or recommendations at this time. I believe staying active and eating right is the ideal course at this time. - Today we provided you with contact information for your GI specialist for when you are due for your colonoscopy.

## 2015-01-09 ENCOUNTER — Ambulatory Visit: Payer: Self-pay | Admitting: Family Medicine

## 2015-01-09 LAB — LIPID PANEL
CHOL/HDL RATIO: 3.3 ratio (ref <=5.0)
Cholesterol: 157 mg/dL (ref 125–200)
HDL: 47 mg/dL (ref >=46)
LDL CALC: 89 mg/dL (ref <130)
TRIGLYCERIDES: 103 mg/dL (ref <150)
VLDL: 21 mg/dL (ref <30)

## 2015-01-09 LAB — COMPREHENSIVE METABOLIC PANEL
ALT: 21 U/L (ref 6–29)
AST: 20 U/L (ref 10–35)
Albumin: 4.3 g/dL (ref 3.6–5.1)
Alkaline Phosphatase: 73 U/L (ref 33–130)
BUN: 11 mg/dL (ref 7–25)
CALCIUM: 9.3 mg/dL (ref 8.6–10.4)
CHLORIDE: 103 mmol/L (ref 98–110)
CO2: 27 mmol/L (ref 20–31)
Creat: 1.08 mg/dL — ABNORMAL HIGH (ref 0.50–1.05)
GLUCOSE: 86 mg/dL (ref 65–99)
POTASSIUM: 3.8 mmol/L (ref 3.5–5.3)
Sodium: 144 mmol/L (ref 135–146)
Total Bilirubin: 0.5 mg/dL (ref 0.2–1.2)
Total Protein: 6.6 g/dL (ref 6.1–8.1)

## 2015-01-25 ENCOUNTER — Telehealth: Payer: Self-pay | Admitting: Family Medicine

## 2015-01-25 NOTE — Telephone Encounter (Signed)
Pt called and would like to know what her lab results were. jw

## 2015-01-28 NOTE — Telephone Encounter (Signed)
Will forward to MD to advise team on results. Jazmin Hartsell,CMA

## 2015-01-31 NOTE — Telephone Encounter (Signed)
Called patient. Got voicemail. Informed her results should have been mailed out and would likely be arriving soon.

## 2015-03-17 DIAGNOSIS — Z860100 Personal history of colon polyps, unspecified: Secondary | ICD-10-CM

## 2015-03-17 HISTORY — DX: Personal history of colonic polyps: Z86.010

## 2015-03-17 HISTORY — DX: Personal history of colon polyps, unspecified: Z86.0100

## 2015-07-11 ENCOUNTER — Encounter: Payer: Self-pay | Admitting: Gastroenterology

## 2015-07-19 ENCOUNTER — Encounter: Payer: Self-pay | Admitting: Family Medicine

## 2015-07-19 ENCOUNTER — Ambulatory Visit (INDEPENDENT_AMBULATORY_CARE_PROVIDER_SITE_OTHER): Payer: 59 | Admitting: Family Medicine

## 2015-07-19 VITALS — BP 145/81 | HR 81 | Temp 98.1°F | Wt 184.0 lb

## 2015-07-19 DIAGNOSIS — R109 Unspecified abdominal pain: Secondary | ICD-10-CM | POA: Insufficient documentation

## 2015-07-19 DIAGNOSIS — R1084 Generalized abdominal pain: Secondary | ICD-10-CM

## 2015-07-19 DIAGNOSIS — N3000 Acute cystitis without hematuria: Secondary | ICD-10-CM | POA: Diagnosis not present

## 2015-07-19 DIAGNOSIS — N3001 Acute cystitis with hematuria: Secondary | ICD-10-CM

## 2015-07-19 DIAGNOSIS — N390 Urinary tract infection, site not specified: Secondary | ICD-10-CM | POA: Insufficient documentation

## 2015-07-19 LAB — POCT URINALYSIS DIPSTICK
Bilirubin, UA: NEGATIVE
Glucose, UA: NEGATIVE
Ketones, UA: NEGATIVE
NITRITE UA: POSITIVE
PH UA: 7
PROTEIN UA: NEGATIVE
SPEC GRAV UA: 1.015
UROBILINOGEN UA: 0.2

## 2015-07-19 LAB — POCT UA - MICROSCOPIC ONLY

## 2015-07-19 MED ORDER — ALBUTEROL SULFATE HFA 108 (90 BASE) MCG/ACT IN AERS: 2.0000 | INHALATION_SPRAY | Freq: Four times a day (QID) | RESPIRATORY_TRACT | Status: DC | PRN

## 2015-07-19 MED ORDER — CEPHALEXIN 250 MG PO CAPS: 250.0000 mg | ORAL_CAPSULE | Freq: Four times a day (QID) | ORAL | Status: DC

## 2015-07-19 NOTE — Patient Instructions (Addendum)
It was a pleasure seeing you today in our clinic. Today we discussed your abdominal discomfort. Here is the treatment plan we have discussed and agreed upon together:   - Continue taking the Miralax as prescribed. - I've prescribed an antibiotic called Keflex. Take this medication every 6 hours for the next 10 days. - I would like for you to call your gastroenterologist to have another colonoscopy performed within the next few weeks. - Please follow-up with me in 10-14 days if your back pain has not improved, or sooner if it is worsened.

## 2015-07-19 NOTE — Assessment & Plan Note (Signed)
Treatment as above. Also, patient did endorse some dark stools. After looking through her chart I noticed patient is due for colonoscopy in less than 2 weeks. I provided her the necessary information to contact her previous gastroenterologist to schedule a colonoscopy. I also asked her to discuss these stools with him or her prior to her colonoscopy.

## 2015-07-19 NOTE — Assessment & Plan Note (Signed)
Uncomplicated:  Patient is here with abdominal pain for the past 10 days. Urinalysis yielded evidence suggestive of a urinary tract infection with bacteruria and significant leukocytes and nitrites.  - Will treat with Keflex 250 mg 4 times a day 10 days. - Follow-up in 7-10 days if symptoms do not improve or worsen. - We discussed return precautions at length.

## 2015-07-19 NOTE — Progress Notes (Signed)
ABDOMINAL PAIN Started as back pain, then progressed to abdominal pain. Some constipation and straining to stool. Does have a history of hemorrhoids. Feels "heavy". Feels she has had problems w/ constipation ever since she had her appendectomy.  Pain began 10 days ago Medications tried: miralax >> helped some  Similar pain before: yes, back when she had some kidney issues. Prior abdominal surgeries: c-section x2, appendectomy.  Symptoms Nausea/vomiting: no Diarrhea: no Constipation: yes Blood in stool: with wiping Blood in vomit: n/a Fever: no Dysuria: no Loss of appetite: no Weight loss: no  Vaginal Bleeding: no Missed menstrual period: s/p hysterectomy  Review of Symptoms - see HPI PMH - Smoking status noted.    CC, SH/smoking status, and VS noted  Objective: BP 145/81 mmHg  Pulse 81  Temp(Src) 98.1 F (36.7 C) (Oral)  Wt 184 lb (83.462 kg) Gen: NAD, alert, cooperative, and pleasant. CV: RRR, no murmur Resp: CTAB, no wheezes, non-labored Abd: Soft, nondistended, mild generalized tenderness without guarding. BS present. No masses or organomegaly Ext: No edema, warm   Assessment and plan:  UTI (urinary tract infection) Uncomplicated:  Patient is here with abdominal pain for the past 10 days. Urinalysis yielded evidence suggestive of a urinary tract infection with bacteruria and significant leukocytes and nitrites.  - Will treat with Keflex 250 mg 4 times a day 10 days. - Follow-up in 7-10 days if symptoms do not improve or worsen. - We discussed return precautions at length.  Abdominal pain Treatment as above. Also, patient did endorse some dark stools. After looking through her chart I noticed patient is due for colonoscopy in less than 2 weeks. I provided her the necessary information to contact her previous gastroenterologist to schedule a colonoscopy. I also asked her to discuss these stools with him or her prior to her colonoscopy.    Orders Placed This  Encounter  Procedures  . Urine culture  . POCT urinalysis dipstick  . POCT UA - Microscopic Only    Meds ordered this encounter  Medications  . cephALEXin (KEFLEX) 250 MG capsule    Sig: Take 1 capsule (250 mg total) by mouth 4 (four) times daily.    Dispense:  40 capsule    Refill:  0  . albuterol (PROVENTIL HFA;VENTOLIN HFA) 108 (90 Base) MCG/ACT inhaler    Sig: Inhale 2 puffs into the lungs every 6 (six) hours as needed for wheezing or shortness of breath.    Dispense:  1 Inhaler    Refill:  2     Elberta Leatherwood, MD,MS,  PGY2 07/19/2015 5:51 PM

## 2015-07-23 ENCOUNTER — Telehealth: Payer: Self-pay | Admitting: Family Medicine

## 2015-07-23 DIAGNOSIS — Z1211 Encounter for screening for malignant neoplasm of colon: Secondary | ICD-10-CM

## 2015-07-30 NOTE — Telephone Encounter (Signed)
Referral placed.  Algis Greenhouse. Jerline Pain, [facility] Resident PGY-2 07/30/2015 5:16 PM

## 2015-07-30 NOTE — Telephone Encounter (Signed)
Need referral to [facility] for patient's colonoscopy and office notes related to GI condition.  Patient's screening is due by 6/15.

## 2015-08-01 ENCOUNTER — Encounter: Payer: Self-pay | Admitting: Gastroenterology

## 2015-09-12 ENCOUNTER — Ambulatory Visit (AMBULATORY_SURGERY_CENTER): Payer: 59 | Admitting: *Deleted

## 2015-09-12 VITALS — Ht 66.5 in | Wt 191.0 lb

## 2015-09-12 DIAGNOSIS — Z86010 Personal history of colonic polyps: Secondary | ICD-10-CM

## 2015-09-12 MED ORDER — NA SULFATE-K SULFATE-MG SULF 17.5-3.13-1.6 GM/177ML PO SOLN
ORAL | Status: DC
Start: 2015-09-12 — End: 2015-09-26

## 2015-09-12 NOTE — Progress Notes (Signed)
No allergies to eggs or soy. No problems with anesthesia.  Pt given Emmi instructions for colonoscopy  No oxygen use  No diet drug use  

## 2015-09-26 ENCOUNTER — Encounter: Payer: Self-pay | Admitting: Gastroenterology

## 2015-09-26 ENCOUNTER — Ambulatory Visit (AMBULATORY_SURGERY_CENTER): Payer: 59 | Admitting: Gastroenterology

## 2015-09-26 VITALS — BP 134/79 | HR 63 | Temp 97.5°F | Resp 11 | Ht 66.0 in | Wt 191.0 lb

## 2015-09-26 DIAGNOSIS — D124 Benign neoplasm of descending colon: Secondary | ICD-10-CM | POA: Diagnosis not present

## 2015-09-26 DIAGNOSIS — D128 Benign neoplasm of rectum: Secondary | ICD-10-CM

## 2015-09-26 DIAGNOSIS — D123 Benign neoplasm of transverse colon: Secondary | ICD-10-CM | POA: Diagnosis not present

## 2015-09-26 DIAGNOSIS — K5730 Diverticulosis of large intestine without perforation or abscess without bleeding: Secondary | ICD-10-CM

## 2015-09-26 DIAGNOSIS — Z860100 Personal history of colon polyps, unspecified: Secondary | ICD-10-CM

## 2015-09-26 DIAGNOSIS — D125 Benign neoplasm of sigmoid colon: Secondary | ICD-10-CM | POA: Diagnosis not present

## 2015-09-26 DIAGNOSIS — D122 Benign neoplasm of ascending colon: Secondary | ICD-10-CM

## 2015-09-26 HISTORY — DX: Diverticulosis of large intestine without perforation or abscess without bleeding: K57.30

## 2015-09-26 HISTORY — PX: COLONOSCOPY W/ POLYPECTOMY: SHX1380

## 2015-09-26 MED ORDER — SODIUM CHLORIDE 0.9 % IV SOLN: 500.0000 mL | INTRAVENOUS | Status: DC

## 2015-09-26 NOTE — Progress Notes (Signed)
Called to room to assist during endoscopic procedure.  Patient ID and intended procedure confirmed with present staff. Received instructions for my participation in the procedure from the performing physician.  

## 2015-09-26 NOTE — Progress Notes (Signed)
Report to PACU, RN, vss, BBS= Clear.  

## 2015-09-26 NOTE — Patient Instructions (Signed)
YOU HAD AN ENDOSCOPIC PROCEDURE TODAY AT THE [facility]:   Refer to the procedure report that was given to you for any specific questions about what was found during the examination.  If the procedure report does not answer your questions, please call your gastroenterologist to clarify.  If you requested that your care partner not be given the details of your procedure findings, then the procedure report has been included in a sealed envelope for you to review at your convenience later.  YOU SHOULD EXPECT: Some feelings of bloating in the abdomen. Passage of more gas than usual.  Walking can help get rid of the air that was put into your GI tract during the procedure and reduce the bloating. If you had a lower endoscopy (such as a colonoscopy or flexible sigmoidoscopy) you may notice spotting of blood in your stool or on the toilet paper. If you underwent a bowel prep for your procedure, you may not have a normal bowel movement for a few days.  Please Note:  You might notice some irritation and congestion in your nose or some drainage.  This is from the oxygen used during your procedure.  There is no need for concern and it should clear up in a day or so.  SYMPTOMS TO REPORT IMMEDIATELY:   Following lower endoscopy (colonoscopy or flexible sigmoidoscopy):  Excessive amounts of blood in the stool  Significant tenderness or worsening of abdominal pains  Swelling of the abdomen that is new, acute  Fever of 100F or higher   For urgent or emergent issues, a gastroenterologist can be reached at any hour by calling [phone].   DIET: Your first meal following the procedure should be a small meal and then it is ok to progress to your normal diet. Heavy or fried foods are harder to digest and may make you feel nauseous or bloated.  Likewise, meals heavy in dairy and vegetables can increase bloating.  Drink plenty of fluids but you should avoid alcoholic beverages for 24 hours. Try to  increase the fiber in your diet, and drink plenty of water.  ACTIVITY:  You should plan to take it easy for the rest of today and you should NOT DRIVE or use heavy machinery until tomorrow (because of the sedation medicines used during the test).    FOLLOW UP: Our staff will call the number listed on your records the next business day following your procedure to check on you and address any questions or concerns that you may have regarding the information given to you following your procedure. If we do not reach you, we will leave a message.  However, if you are feeling well and you are not experiencing any problems, there is no need to return our call.  We will assume that you have returned to your regular daily activities without incident.  If any biopsies were taken you will be contacted by phone or by letter within the next 1-3 weeks.  Please call us at [phone] if you have not heard about the biopsies in 3 weeks.    SIGNATURES/CONFIDENTIALITY: You and/or your care partner have signed paperwork which will be entered into your electronic medical record.  These signatures attest to the fact that that the information above on your After Visit Summary has been reviewed and is understood.  Full responsibility of the confidentiality of this discharge information lies with you and/or your care-partner.  Read all of the handouts given to you by your recovery   room nurse.  You will need another colonoscopy in 3 years due to the amount of polyps.  Thank-you for choosing [place] for you healthcare needs today.

## 2015-09-26 NOTE — Op Note (Signed)
[facility] Patient Name: Anna Berry Procedure Date: 09/26/2015 9:33 AM MRN: [id number] Endoscopist: Mauri Pole , MD Age: 55 Referring MD:  Date of Birth: 1960-10-30 Gender: Female Account #: [id number] Procedure:                Colonoscopy Indications:              Surveillance: Personal history of adenomatous                            polyps on last colonoscopy > 5 years ago Medicines:                Monitored Anesthesia Care Procedure:                Pre-Anesthesia Assessment:                           - Prior to the procedure, a History and Physical                            was performed, and patient medications and                            allergies were reviewed. The patient's tolerance of                            previous anesthesia was also reviewed. The risks                            and benefits of the procedure and the sedation                            options and risks were discussed with the patient.                            All questions were answered, and informed consent                            was obtained. Prior Anticoagulants: The patient has                            taken no previous anticoagulant or antiplatelet                            agents. ASA Grade Assessment: II - A patient with                            mild systemic disease. After reviewing the risks                            and benefits, the patient was deemed in                            satisfactory condition to undergo the procedure.  After obtaining informed consent, the colonoscope                            was passed under direct vision. Throughout the                            procedure, the patient's blood pressure, pulse, and                            oxygen saturations were monitored continuously. The                            Model CF-HQ190L [id number]) scope was introduced                            through the anus  and advanced to the the terminal                            ileum, with identification of the appendiceal                            orifice and IC valve. The colonoscopy was performed                            without difficulty. The patient tolerated the                            procedure well. The quality of the bowel                            preparation was good. The terminal ileum, ileocecal                            valve, appendiceal orifice, and rectum were                            photographed. Scope In: 9:39:49 AM Scope Out: 10:06:28 AM Scope Withdrawal Time: 0 hours 22 minutes 13 seconds  Total Procedure Duration: 0 hours 26 minutes 39 seconds  Findings:                 The perianal and digital rectal examinations were                            normal.                           Three sessile polyps were found in the descending                            colon, transverse colon and ascending colon. The                            polyps were 4 to 9 mm in size. These polyps were  removed with a cold snare. Resection and retrieval                            were complete.                           A 2 mm polyp was found in the transverse colon. The                            polyp was sessile. The polyp was removed with a                            cold biopsy forceps. Resection and retrieval were                            complete.                           Two multi-lobulated and pedunculated polyps were                            found in the sigmoid colon. The polyps were 9 to 15                            mm in size. These polyps were removed with a hot                            snare. Resection and retrieval were complete.                           A 5 mm polyp was found in the rectum. The polyp was                            sessile. The polyp was removed with a cold snare.                            Resection and retrieval were  complete.                           Multiple small and large-mouthed diverticula were                            found in the sigmoid colon, descending colon and                            ascending colon.                           Non-bleeding internal hemorrhoids were found during                            retroflexion. The hemorrhoids were small. Complications:            No immediate complications. Estimated Blood Loss:  Estimated blood loss was minimal. Impression:               - Three 4 to 9 mm polyps in the descending colon,                            in the transverse colon and in the ascending colon,                            removed with a cold snare. Resected and retrieved.                           - One 2 mm polyp in the transverse colon, removed                            with a cold biopsy forceps. Resected and retrieved.                           - Two 9 to 15 mm polyps in the sigmoid colon,                            removed with a hot snare. Resected and retrieved.                           - One 5 mm polyp in the rectum, removed with a cold                            snare. Resected and retrieved.                           - Diverticulosis in the sigmoid colon, in the                            descending colon and in the ascending colon.                           - Non-bleeding internal hemorrhoids. Recommendation:           - Patient has a contact number available for                            emergencies. The signs and symptoms of potential                            delayed complications were discussed with the                            patient. Return to normal activities tomorrow.                            Written discharge instructions were provided to the                            patient.                           -  Resume previous diet.                           - Continue present medications.                           - Await pathology results.                            - Repeat colonoscopy in 3 years for surveillance.                           - Return to GI clinic PRN. Mauri Pole, MD 09/26/2015 10:13:11 AM This report has been signed electronically.

## 2015-09-27 ENCOUNTER — Telehealth: Payer: Self-pay | Admitting: *Deleted

## 2015-09-27 NOTE — Telephone Encounter (Signed)
  Follow up Call-  Call back number 09/26/2015  Post procedure Call Back phone  # [phone]  Permission to leave phone message Yes     Patient questions:  Do you have a fever, pain , or abdominal swelling? No. Pain Score  0 *  Have you tolerated food without any problems? Yes.    Have you been able to return to your normal activities? Yes.    Do you have any questions about your discharge instructions: Diet   No. Medications  No. Follow up visit  No.  Do you have questions or concerns about your Care? No.  Actions: * If pain score is 4 or above: No action needed, pain <4.

## 2015-09-30 ENCOUNTER — Encounter: Payer: Self-pay | Admitting: Gastroenterology

## 2015-11-22 ENCOUNTER — Ambulatory Visit (INDEPENDENT_AMBULATORY_CARE_PROVIDER_SITE_OTHER): Payer: 59 | Admitting: Internal Medicine

## 2015-11-22 VITALS — BP 136/83 | HR 78 | Temp 98.3°F | Ht 66.0 in | Wt 191.4 lb

## 2015-11-22 DIAGNOSIS — S29011A Strain of muscle and tendon of front wall of thorax, initial encounter: Secondary | ICD-10-CM
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
## 2015-11-22 NOTE — Patient Instructions (Addendum)
Please take tylenol for pain as needed and icing.  Pain should resolve within 1 week.

## 2015-11-22 NOTE — Assessment & Plan Note (Signed)
Musculoskeletal strain, no concern for bony abnormalities or fractures. No bruising. No redness. Pain is slight per patient. Provide patient with recommendation to use Tylenol and icing as needed to help alleviate the pain. Follow up as needed

## 2016-01-08 ENCOUNTER — Ambulatory Visit (INDEPENDENT_AMBULATORY_CARE_PROVIDER_SITE_OTHER): Payer: 59 | Admitting: Family Medicine

## 2016-01-08 ENCOUNTER — Encounter: Payer: Self-pay | Admitting: Family Medicine

## 2016-01-08 DIAGNOSIS — M25561 Pain in right knee: Secondary | ICD-10-CM

## 2016-01-08 NOTE — Patient Instructions (Addendum)
It was nice to meet you You need to elevate your knee and ice it 20 minutes at least 3 times per day to help with swelling. Compression may also help when she is back at work, a soft knee sleeve came be beneficial You can take Tylenol 1 tablets every 8 hours as needed for pain. Try to limit this intake.  If you having difficulty sleeping due to the pain, it is okay to use the tramadol that you have. If you're swelling is worsening or not improving, follow up in our clinic for further evaluation. You can make a nurse appointment for a flu vaccine at times it is convenient for you Osteoarthritis Osteoarthritis is a disease that causes soreness and inflammation of a joint. It occurs when the cartilage at the affected joint wears down. Cartilage acts as a cushion, covering the ends of bones where they meet to form a joint. Osteoarthritis is the most common form of arthritis. It often occurs in older people. The joints affected most often by this condition include those in the:  Ends of the fingers.  Thumbs.  Neck.  Lower back.  Knees.  Hips. CAUSES  Over time, the cartilage that covers the ends of bones begins to wear away. This causes bone to rub on bone, producing pain and stiffness in the affected joints.  RISK FACTORS Certain factors can increase your chances of having osteoarthritis, including:  Older age.  Excessive body weight.  Overuse of joints.  Previous joint injury. SIGNS AND SYMPTOMS   Pain, swelling, and stiffness in the joint.  Over time, the joint may lose its normal shape.  Small deposits of bone (osteophytes) may grow on the edges of the joint.  Bits of bone or cartilage can break off and float inside the joint space. This may cause more pain and damage. DIAGNOSIS  Your health care provider will do a physical exam and ask about your symptoms. Various tests may be ordered, such as:  X-rays of the affected joint.  Blood tests to rule out other types of  arthritis. Additional tests may be used to diagnose your condition. TREATMENT  Goals of treatment are to control pain and improve joint function. Treatment plans may include:  A prescribed exercise program that allows for rest and joint relief.  A weight control plan.  Pain relief techniques, such as:  Properly applied heat and cold.  Electric pulses delivered to nerve endings under the skin (transcutaneous electrical nerve stimulation [TENS]).  Massage.  Certain nutritional supplements.  Medicines to control pain, such as:  Acetaminophen.  Nonsteroidal anti-inflammatory drugs (NSAIDs), such as naproxen.  Narcotic or central-acting agents, such as tramadol.  Corticosteroids. These can be given orally or as an injection.  Surgery to reposition the bones and relieve pain (osteotomy) or to remove loose pieces of bone and cartilage. Joint replacement may be needed in advanced states of osteoarthritis. HOME CARE INSTRUCTIONS   Take medicines only as directed by your health care provider.  Maintain a healthy weight. Follow your health care provider's instructions for weight control. This may include dietary instructions.  Exercise as directed. Your health care provider can recommend specific types of exercise. These may include:  Strengthening exercises. These are done to strengthen the muscles that support joints affected by arthritis. They can be performed with weights or with exercise bands to add resistance.  Aerobic activities. These are exercises, such as brisk walking or low-impact aerobics, that get your heart pumping.  Range-of-motion activities. These keep your  joints limber.  Balance and agility exercises. These help you maintain daily living skills.  Rest your affected joints as directed by your health care provider.  Keep all follow-up visits as directed by your health care provider. SEEK MEDICAL CARE IF:   Your skin turns red.  You develop a rash in  addition to your joint pain.  You have worsening joint pain.  You have a fever along with joint or muscle aches. SEEK IMMEDIATE MEDICAL CARE IF:  You have a significant loss of weight or appetite.  You have night sweats. [place] of Arthritis and Musculoskeletal and Skin Diseases: www.niams.SouthExposed.es  [employer] on Aging: http://kim-miller.com/  American College of Rheumatology: www.rheumatology.org   This information is not intended to replace advice given to you by your health care provider. Make sure you discuss any questions you have with your health care provider.   Document Released: 03/02/2005 Document Revised: 03/23/2014 Document Reviewed: 11/07/2012 Elsevier Interactive Patient Education [employer].

## 2016-01-08 NOTE — Assessment & Plan Note (Signed)
Seems to be mostly swelling with some mild pain. Etiology unclear, possibly OA given imaging from 2015 and swelling. Ligament testing unremarkable.  - conservative management with icing, elevation, and compression as needed. - cannot tolerate NSAIDs. Discussed she could use Tylenol sparingly as needed (would be ok for her to take more than once per week). She may also take tramadol for breakthrough pain. - if her symptoms are not improving or worsens, the pt was advised to f/u in our clinic.

## 2016-01-08 NOTE — Progress Notes (Signed)
Subjective: CC: knee swelling  HPI: Patient is a 55 y.o. female with a past medical history of arthritis, tricompartmental disease of the knee, R knee pain, CKD presenting to clinic today for a SDA for R knee pain .  She had her legs propped up on an ottoman 2-2.5 weeks crossed, when she took her legs down she felt a pop in her R knee (didn't hurt). She didn't notice swelling initially. She notes last weeks she noticed swelling diffusely. She notes the knee feels warm compared to the other.  There is not significant pain unless you put pressure on it. She notes when you touch the knee it feels like a "toothache." Sometimes she has shooting pains that feel like needles with ambulation.   No popping since the initial occurrence. She sometimes has difficulty extending knee completely. She notes intermittent locking. No instability.   She notes that the pain is different from her previous knee pain as it is not as painful, just more swelling.  She's taking Tylenol Monday night (can't take too much due to health issues) but notes she can't take NSAIDs.   Social History: never smoker.   Health Maintenance: declines flu vaccine today  ROS: All other systems reviewed and are negative besides that noted in HPI.  Past Medical History Patient Active Problem List   Diagnosis Date Noted  . Muscle strain of chest wall 11/22/2015  . UTI (urinary tract infection) 07/19/2015  . Abdominal pain 07/19/2015  . BMI 29.0-29.9,adult 08/24/2013  . 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 Health care maintenance 05/11/2012  . Obesity (BMI 30-39.9) 05/11/2012  . 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 Right knee pain 06/22/2011  . Left shoulder pain 06/22/2011  . Left foot pain 06/22/2011  . Fatigue 11/26/2010  . Depression 11/26/2010  . 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 Asthma 12/29/2009  . POSTMENOPAUSAL SYNDROME 08/26/2009  . ARTHRITIS 08/26/2009  . HEMATURIA  UNSPECIFIED 05/21/2008  . ALLERGIC RHINITIS 05/29/2007  . SICKLE CELL TRAIT 05/13/2006    Medications- reviewed and updated  Objective: Office vital signs reviewed. BP 131/79 (BP Location: Left Arm, Patient Position: Sitting, Cuff Size: Normal)   Pulse 84   Temp 98.4 F (36.9 C) (Oral)   Ht 5' 6.5" (1.689 m)   Wt 192 lb 9.6 oz (87.4 kg)   BMI 30.62 kg/m    Physical Examination:  General: Awake, alert, well- nourished, NAD Right knee: mild effusion noted over both the medial and lateral aspects. No erythema or warmth. No obvious Baker's cysts but some fullness.  Palpation normal with no warmth or joint line tenderness or patellar tenderness or condyle tenderness.  No TTP along infrapatellar or pes anserine bursas.   ROM decreased with flexion (110 degrees), normal with extension (0 degrees) and lower leg rotation. Ligaments with solid consistent endpoints including ACL, PCL, LCL, MCL.  Negative Anterior Drawer/Lachman/Pivot Shift Negative Mcmurray's and Thessaly.  Non painful patellar compression.   Patellar and quadriceps tendons unremarkable. Hamstring and quadriceps strength is normal.   X-ray R knee 2015: Moderate tricompartmental degenerative changes but no acute bony findings.  Assessment/Plan: Right knee pain Seems to be mostly swelling with some mild pain. Etiology unclear, possibly OA given imaging from 2015 and swelling. Ligament testing unremarkable.  - conservative management with icing, elevation, and compression as needed. - cannot tolerate NSAIDs. Discussed she could use Tylenol sparingly as needed (would be ok for her to take more than once per week). She may also  take tramadol for breakthrough pain. - if her symptoms are not improving or worsens, the pt was advised to f/u in our clinic.    No orders of the defined types were placed in this encounter.   No orders of the defined types were placed in this encounter.   Archie Patten PGY-3, [place]

## 2016-02-21 ENCOUNTER — Encounter: Payer: Self-pay | Admitting: Family Medicine

## 2016-02-21 ENCOUNTER — Ambulatory Visit (INDEPENDENT_AMBULATORY_CARE_PROVIDER_SITE_OTHER): Payer: 59 | Admitting: Family Medicine

## 2016-02-21 VITALS — BP 134/82 | HR 81 | Temp 98.4°F | Ht 66.5 in | Wt 189.8 lb

## 2016-02-21 DIAGNOSIS — M25561 Pain in right knee: Secondary | ICD-10-CM | POA: Diagnosis not present

## 2016-02-21 MED ORDER — METHYLPREDNISOLONE ACETATE 40 MG/ML IJ SUSP
40.0000 mg | Freq: Once | INTRAMUSCULAR | Status: AC
  Administered 2016-02-21: 40 mg via INTRAMUSCULAR

## 2016-02-21 NOTE — Assessment & Plan Note (Signed)
Patient is here with complaints of right-sided knee pain. Effusion was present. No mechanical symptoms. Likely cause is osteoarthritis and chronic inflammation.  - 20 mL of clear synovial fluid was aspirated and steroid injection followed. - Provided straight leg raise quadriceps strengthening exercises for patient to perform at home. (3 sets of 15 daily, both sides) - Follow-up as needed

## 2016-02-21 NOTE — Patient Instructions (Signed)
It was a pleasure seeing you today in our clinic. Today we discussed your right knee pain. Here is the treatment plan we have discussed and agreed upon together:   - Today we are able to aspirate and inject steroid and your knee. Allow 3-5 days for the steroid to "kick in". - If you start to experience significant worsening in pain, your knee gets red and swollen, or you're unable to flex/extend her knee I would recommend getting evaluated by a healthcare professional immediately.  Exercises: As discussed like for you to begin performing "straight leg raises". - With your leg fully extended I would like for you to lift your heel off the ground 6-8 inches in a slow controlled manner. - Do this with your toe pointed straight-up, left, and right. 3 sets of 15 repetitions for each toe direction. - Do this for both legs once a day at least.

## 2016-02-21 NOTE — Progress Notes (Signed)
   HPI  CC: Right knee pain Patient is here with complaints of right-sided knee pain. She states that this pain has been chronic but has been gradually worsening over the past few months. She states that she typically takes Tylenol for this pain and has received 2 previous knee injections in this knee (most recently 2 years ago). She endorses knee swelling and the feeling of fullness that seems to be inhibiting her ability to fully flex her knee. Denies any prior injury or trauma. She denies any erythema or warmth. She endorses some clicking/popping but denies any mechanical symptoms of locking or catching. She denies any fevers, chills, lightheadedness, or dizziness. No history of gout.  Review of Systems    See HPI for ROS. All other systems reviewed and are negative.  CC, SH/smoking status, and VS noted  Objective: BP 134/82   Pulse 81   Temp 98.4 F (36.9 C) (Oral)   Ht 5' 6.5" (1.689 m)   Wt 189 lb 12.8 oz (86.1 kg)   BMI 30.18 kg/m  Gen: NAD, alert, cooperative, and pleasant. CV: RRR Resp: CTAB Knee, right: Effusion present, no erythema or evidence of bony deformity, no signs of trauma, patellar crepitus with flexion and extension, all 4 ligaments intact with solid point, lateral joint line tenderness noted. Neuro: Alert and oriented, Speech clear, No gross deficits  ASPIRATION & INJECTION: Right knee Patient was given informed consent, signed copy in the chart. Appropriate time out was taken. Area prepped and draped in usual sterile fashion. 2 mL of lidocaine without epinephrine was used to numb the skin around the lateral suprapatellar pouch. A 18-gauge needle was then used to pull approximately 21 mL of clear yellow synovial fluid from the joint. Then 1 cc of methylprednisolone 40 mg/ml plus  2 cc of 1% lidocaine without epinephrine was injected into the right knee using a(n) lateral suprapatellar pouch approach. The patient tolerated the procedure well. There were no  complications. Post procedure instructions were given.   Assessment and plan:  Right knee pain Patient is here with complaints of right-sided knee pain. Effusion was present. No mechanical symptoms. Likely cause is osteoarthritis and chronic inflammation.  - 20 mL of clear synovial fluid was aspirated and steroid injection followed. - Provided straight leg raise quadriceps strengthening exercises for patient to perform at home. (3 sets of 15 daily, both sides) - Follow-up as needed   Meds ordered this encounter  Medications  . methylPREDNISolone acetate (DEPO-MEDROL) injection 40 mg     Elberta Leatherwood, MD,MS,  PGY3 02/21/2016 3:01 PM

## 2016-11-27 DIAGNOSIS — N172 Acute kidney failure with medullary necrosis: Secondary | ICD-10-CM | POA: Diagnosis not present

## 2016-11-27 DIAGNOSIS — N390 Urinary tract infection, site not specified: Secondary | ICD-10-CM | POA: Diagnosis not present

## 2016-11-27 DIAGNOSIS — I10 Essential (primary) hypertension: Secondary | ICD-10-CM | POA: Diagnosis not present

## 2016-12-18 ENCOUNTER — Ambulatory Visit (INDEPENDENT_AMBULATORY_CARE_PROVIDER_SITE_OTHER): Payer: 59 | Admitting: Family Medicine

## 2016-12-18 ENCOUNTER — Encounter: Payer: Self-pay | Admitting: Family Medicine

## 2016-12-18 VITALS — BP 118/80 | HR 79 | Temp 99.2°F | Ht 67.0 in | Wt 186.0 lb

## 2016-12-18 DIAGNOSIS — J4520 Mild intermittent asthma, uncomplicated: Secondary | ICD-10-CM

## 2016-12-18 DIAGNOSIS — M1712 Unilateral primary osteoarthritis, left knee: Secondary | ICD-10-CM | POA: Insufficient documentation

## 2016-12-18 DIAGNOSIS — Z23 Encounter for immunization: Secondary | ICD-10-CM | POA: Diagnosis not present

## 2016-12-18 DIAGNOSIS — Z0000 Encounter for general adult medical examination without abnormal findings: Secondary | ICD-10-CM

## 2016-12-18 DIAGNOSIS — M1711 Unilateral primary osteoarthritis, right knee: Secondary | ICD-10-CM

## 2016-12-18 MED ORDER — ALBUTEROL SULFATE HFA 108 (90 BASE) MCG/ACT IN AERS: 2.0000 | INHALATION_SPRAY | Freq: Four times a day (QID) | RESPIRATORY_TRACT | 2 refills | Status: DC | PRN

## 2016-12-18 NOTE — Patient Instructions (Addendum)
It was great to meet you today! Thank you for letting me participate in your care!  Today, we discussed your overall health and fitness. It would benefit you to start a daily exercise regimen. Please try to exercise at least 30 minutes per day for 5 days per week.  Eating a balanced and healthy diet (moderate carbohydrates and fats) will also help your overall health.  We also discussed routine health maintenance. Today you received your Tetanus shot, your yearly Influenza shot, and recommended screening.  Be well, Anna Rutherford, DO PGY-1, [facility]

## 2016-12-18 NOTE — Progress Notes (Signed)
Anna Coombe, MD, MS Phone: [phone]  Subjective:  CC -- Annual Physical; With complaints of left foot swelling and bilateral leg pain. It is described as achy, gets worse with standing and activity. 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 She also has a history of bilateral OA in her knees. Pt reports she common bladder infections due to complications of sickle cell trait.   Patient states she is not having any depressive symptoms or feeling depressed at this time. She feels like it is well controlled without medication.  She does endorse anxiety due to stress from work and from her husband due to his recent diagnosis of multiple myeloma. She stated she feels like prayer is helping her cope with these bouts of anxiety well and she does not want or feel like she needs intervention at this time. See her GAD-7 screen on file from today, less than 5.  She also states she would like a refill of her albuterol inhaler for her asthma. She only uses it 1-2 times per month with no night time awakenings.  ROS- See HPI above  Past Medical History Patient Active Problem List   Diagnosis Date Noted  . 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 Obesity (BMI 30-39.9) 05/11/2012  . 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 Right knee pain 06/22/2011  . Depression 11/26/2010  . 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 Asthma 12/29/2009  . POSTMENOPAUSAL SYNDROME 08/26/2009  . ARTHRITIS 08/26/2009  . HEMATURIA UNSPECIFIED 05/21/2008  . ALLERGIC RHINITIS 05/29/2007  . SICKLE CELL TRAIT 05/13/2006    Medications- reviewed and updated Current Outpatient Prescriptions  Medication Sig Dispense Refill  . albuterol (PROVENTIL HFA;VENTOLIN HFA) 108 (90 Base) MCG/ACT inhaler Inhale 2 puffs into the lungs every 6 (six) hours as needed for wheezing or shortness of breath. 1 Inhaler 2  . Cholecalciferol (VITAMIN D PO) Take by mouth daily.    Marland Kitchen loratadine (CLARITIN) 10 MG tablet  Take 10 mg by mouth daily as needed for allergies.     . polyethylene glycol powder (GLYCOLAX/MIRALAX) powder Take 17 g by mouth 2 (two) times daily as needed. (Patient not taking: Reported on 09/12/2015) 3350 g 1   No current facility-administered medications for this visit.     Objective: BP 118/80   Pulse 79   Temp 99.2 F (37.3 C) (Oral)   Ht _0  (1.702 m)   Wt 186 lb (84.4 kg)   SpO2 99%   BMI 29.13 kg/m  Gen: NAD, alert, cooperative with exam HEENT: NCAT, EOMI, PERRL CV: RRR, good S1/S2, no murmur Resp: CTABL, no wheezes, non-labored Abd: Soft, Non Tender, Non Distended, BS present, no guarding or organomegaly MSK: crepitus bilaterally in the knees, Lochmann test negative bilaterally, ant and post drawer test negative bilaterally, McMurray's test negative bilaterally, gait is normal. Ext: +1 pedal edema more on the left than the right, no cyanosis Skin: warm, dry, intact Neuro: Alert and oriented, No gross deficits   Assessment/Plan:  Osteoarthritis of right knee Patient has crepits on exam and history of longstanding OA. I discussed her options in detail and patient decided at this time to remain with current management of tylenol and nsaids as needed.   She has gotten good relief from corticosteroid shots in the past and states her pain at this time is not to the level where she needs one now. The shots are lasting her over a year.  I did talk to her about weight bearing exercise  done 3-5 times per week every week to help.  She did not want to see an orthopedic surgeon for discussion of knee joint replacement at this time. However, I did talk with her that a few years down the road it may be a good option.  Primary osteoarthritis of left knee Patient has crepits on exam and history of longstanding OA. I discussed her options in detail and patient decided at this time to remain with current management of tylenol and nsaids as needed.   She has gotten good relief from  corticosteroid shots in the past and states her pain at this time is not to the level where she needs one now. The shots are lasting her over a year.  I did talk to her about weight bearing exercise done 3-5 times per week every week to help.  She did not want to see an orthopedic surgeon for discussion of knee joint replacement at this time. However, I did talk with her that a few years down the road it may be a good option.   Orders Placed This Encounter  Procedures  . MM Digital Screening    Standing Status:   Future    Standing Expiration Date:   02/17/2018    Order Specific Question:   Reason for Exam (SYMPTOM  OR DIAGNOSIS REQUIRED)    Answer:   health maintenance    Order Specific Question:   Is the patient pregnant?    Answer:   No    Order Specific Question:   Preferred imaging location?    Answer:   [facility]  . Flu Vaccine QUAD 36+ mos IM  . Tdap vaccine greater than or equal to 7yo IM  . Hepatitis C Antibody  . HIV antibody (with reflex)    Meds ordered this encounter  Medications  . albuterol (PROVENTIL HFA;VENTOLIN HFA) 108 (90 Base) MCG/ACT inhaler    Sig: Inhale 2 puffs into the lungs every 6 (six) hours as needed for wheezing or shortness of breath.    Dispense:  1 Inhaler    Refill:  [place], DO,  PGY1 12/22/2016 12:48 AM

## 2016-12-19 LAB — HIV ANTIBODY (ROUTINE TESTING W REFLEX): HIV SCREEN 4TH GENERATION: NONREACTIVE

## 2016-12-19 LAB — HEPATITIS C ANTIBODY: Hep C Virus Ab: <0.1 s/co ratio (ref 0.0–0.9)

## 2016-12-22 NOTE — Assessment & Plan Note (Signed)
Patient has crepits on exam and history of longstanding OA. I discussed her options in detail and patient decided at this time to remain with current management of tylenol and nsaids as needed.   She has gotten good relief from corticosteroid shots in the past and states her pain at this time is not to the level where she needs one now. The shots are lasting her over a year.  I did talk to her about weight bearing exercise done 3-5 times per week every week to help.  She did not want to see an orthopedic surgeon for discussion of knee joint replacement at this time. However, I did talk with her that a few years down the road it may be a good option.

## 2016-12-24 ENCOUNTER — Encounter: Payer: Self-pay | Admitting: Family Medicine

## 2016-12-24 NOTE — Progress Notes (Signed)
Mailed letter to patient informing her of normal results. Will call patient to let her know her test results were normal.

## 2017-01-07 ENCOUNTER — Other Ambulatory Visit: Payer: Self-pay | Admitting: Family Medicine

## 2017-01-07 DIAGNOSIS — Z1231 Encounter for screening mammogram for malignant neoplasm of breast: Secondary | ICD-10-CM

## 2017-01-12 ENCOUNTER — Ambulatory Visit
Admission: RE | Admit: 2017-01-12 | Discharge: 2017-01-12 | Disposition: A | Discharge status: Home / Self Care | Payer: 59 | Source: Ambulatory Visit | Attending: Family Medicine | Admitting: Family Medicine

## 2017-01-12 DIAGNOSIS — Z1231 Encounter for screening mammogram for malignant neoplasm of breast: Secondary | ICD-10-CM

## 2017-01-25 ENCOUNTER — Encounter: Payer: Self-pay | Admitting: Family Medicine

## 2017-01-25 NOTE — Progress Notes (Signed)
Patient will be mailed the result of her mammogram, results were normal.

## 2017-04-30 DIAGNOSIS — N390 Urinary tract infection, site not specified: Secondary | ICD-10-CM | POA: Diagnosis not present

## 2017-05-17 DIAGNOSIS — N390 Urinary tract infection, site not specified: Secondary | ICD-10-CM | POA: Diagnosis not present

## 2017-06-13 ENCOUNTER — Emergency Department (HOSPITAL_COMMUNITY)
Admission: EM | Admit: 2017-06-13 | Discharge: 2017-06-13 | Disposition: A | Discharge status: Home / Self Care | Payer: 59 | Attending: Emergency Medicine | Admitting: Emergency Medicine

## 2017-06-13 ENCOUNTER — Encounter (HOSPITAL_COMMUNITY): Payer: Self-pay | Admitting: *Deleted

## 2017-06-13 ENCOUNTER — Other Ambulatory Visit: Payer: Self-pay

## 2017-06-13 DIAGNOSIS — M791 Myalgia, unspecified site: Secondary | ICD-10-CM

## 2017-06-13 DIAGNOSIS — J45909 Unspecified asthma, uncomplicated: Secondary | ICD-10-CM | POA: Diagnosis not present

## 2017-06-13 DIAGNOSIS — M25511 Pain in right shoulder: Secondary | ICD-10-CM | POA: Diagnosis present

## 2017-06-13 DIAGNOSIS — M542 Cervicalgia: Secondary | ICD-10-CM | POA: Diagnosis not present

## 2017-06-13 DIAGNOSIS — R51 Headache: Secondary | ICD-10-CM | POA: Diagnosis not present

## 2017-06-13 DIAGNOSIS — M7918 Myalgia, other site: Secondary | ICD-10-CM | POA: Diagnosis not present

## 2017-06-13 DIAGNOSIS — F329 Major depressive disorder, single episode, unspecified: Secondary | ICD-10-CM | POA: Insufficient documentation

## 2017-06-13 DIAGNOSIS — D573 Sickle-cell trait: Secondary | ICD-10-CM | POA: Insufficient documentation

## 2017-06-13 MED ORDER — CYCLOBENZAPRINE HCL 5 MG PO TABS: 5.0000 mg | ORAL_TABLET | Freq: Every evening | ORAL | 0 refills | Status: DC | PRN

## 2017-06-13 MED ORDER — PREDNISONE 10 MG PO TABS: 40.0000 mg | ORAL_TABLET | Freq: Every day | ORAL | 0 refills | Status: AC

## 2017-06-13 NOTE — ED Triage Notes (Signed)
Pt woke up Tuesday morning with a crock in right neck and it radiates down her right arm.  Pt states that it is a constant pain.  Pt can move right arm.  Reports she has been having bad headaches and nose bleeds.

## 2017-06-13 NOTE — ED Notes (Signed)
Declined W/C at D/C and was escorted to lobby by RN. 

## 2017-06-13 NOTE — ED Provider Notes (Signed)
[facility] EMERGENCY DEPARTMENT Provider Note   CSN: [id number] Arrival date & time: 06/13/17  1327     History   Chief Complaint No chief complaint on file.   HPI Anna Berry is a 57 y.o. female presenting for evaluation of right shoulder and neck pain.  Patient states that on Tuesday she woke up with neck stiffness.  She thought she just slept on it wrong.  Since then, she has had progression of the pain down to her elbow.  Pain is worse with certain movements of her head and arm.  Is described as a soreness or an ache.  No numbness or tingling.  No fall, trauma, or injury.  She denies headache, vision changes, or weakness.  She works at [employer] where she lifts heavy boxes frequently.  She has kidney issues, is unable to take NSAIDs due to increased risk of bleeding and kidney damage.  She has been taking Tylenol and tramadol without significant improvement of her symptoms.  She has not tried anything else.  She has a history of arthritis, but states this feels similar except there is no pain in the joint with movement.    Additionally, patient reports that she intermittently gets headaches that are resolved by epistaxis.  This happens a couple times a month, has been happening more frequently the past month and a half.  This is been going on for several years.  She has never had it evaluated.  She is currently without a headache or nosebleed.  HPI  Past Medical History:  Diagnosis Date  . Allergy   . Arthritis   . Asthma    allergies  . Obesity   . Plantar fasciitis   . Postmenopausal   . 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 Sickle cell anemia (HCC)    sickle cell trait  . Sickle cell trait [facility])     Patient Active Problem List   Diagnosis Date Noted  . Primary osteoarthritis of left knee 12/18/2016  . 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 Obesity (BMI 30-39.9) 05/11/2012  . 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  Right knee pain 06/22/2011  . Depression 11/26/2010  . 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 Asthma 12/29/2009  . POSTMENOPAUSAL SYNDROME 08/26/2009  . Osteoarthritis of right knee 08/26/2009  . HEMATURIA UNSPECIFIED 05/21/2008  . ALLERGIC RHINITIS 05/29/2007  . SICKLE CELL TRAIT 05/13/2006    Past Surgical History:  Procedure Laterality Date  . ABDOMINAL HYSTERECTOMY  2004  . APPENDECTOMY  2006  . [place]   x2     OB History   None      Home Medications    Prior to Admission medications   Medication Sig Start Date End Date Taking? Authorizing Provider  albuterol (PROVENTIL HFA;VENTOLIN HFA) 108 (90 Base) MCG/ACT inhaler Inhale 2 puffs into the lungs every 6 (six) hours as needed for wheezing or shortness of breath. 12/18/16   Nuala Alpha, DO  Cholecalciferol (VITAMIN D PO) Take by mouth daily.    [provider]  cyclobenzaprine (FLEXERIL) 5 MG tablet Take 1 tablet (5 mg total) by mouth at bedtime as needed. 06/13/17   Zera Markwardt, PA-C  loratadine (CLARITIN) 10 MG tablet Take 10 mg by mouth daily as needed for allergies.     [provider]  polyethylene glycol powder (GLYCOLAX/MIRALAX) powder Take 17 g by mouth 2 (two) times daily as needed. Patient not taking: Reported on 09/12/2015  03/01/14   Street, Sharon [place], MD  predniSONE (DELTASONE) 10 MG tablet Take 4 tablets (40 mg total) by mouth daily for 5 days. 06/13/17 06/18/17  Angell Honse, PA-C    Family History Family History  Problem Relation Age of Onset  . Stomach cancer Maternal Grandmother 78  . Stroke Maternal Grandmother   . Diabetes Father   . Kidney disease Father   . Colon cancer Neg Hx     Social History Social History   Tobacco Use  . Smoking status: Never Smoker  . Smokeless tobacco: Never Used  Substance Use Topics  . Alcohol use: No    Alcohol/week: 0.0 oz  . Drug use: No     Allergies   Acetaminophen; Morphine and related; and  Nsaids   Review of Systems Review of Systems  HENT: Positive for nosebleeds (intermittent).   Musculoskeletal: Positive for arthralgias. Negative for joint swelling.  Neurological: Positive for headaches (intermittent). Negative for numbness.  Hematological: Does not bruise/bleed easily.     Physical Exam Updated Vital Signs BP 137/82 (BP Location: Right Arm)   Pulse 72   Temp 97.9 F (36.6 C) (Oral)   Resp 18   SpO2 100%   Physical Exam  Constitutional: She is oriented to [place], place, and time. She appears well-developed and well-nourished. No distress.  HENT:  Head: Normocephalic and atraumatic.  Nose: Mucosal edema present. No epistaxis. Right sinus exhibits no maxillary sinus tenderness and no frontal sinus tenderness. Left sinus exhibits no maxillary sinus tenderness and no frontal sinus tenderness.  Mouth/Throat: Uvula is midline, oropharynx is clear and moist and mucous membranes are normal.  Eyes: Pupils are equal, round, and reactive to light. Conjunctivae and EOM are normal.  Neck: Normal range of motion.  No tenderness to palpation of midline C-spine.  Full active range of motion of head and neck.  Mild tenderness palpation of right-sided neck paraspinal muscles.  Cardiovascular: Normal rate, regular rhythm and intact distal pulses.  Pulmonary/Chest: Breath sounds normal. No respiratory distress. She has no wheezes.  Abdominal: She exhibits no distension.  Musculoskeletal: Normal range of motion. She exhibits tenderness. She exhibits no deformity.  Mild tenderness to palpation of right sided trapezius muscle.  No tenderness to palpation of bony protrusions.  Full active range of motion of upper extremities without pain or difficulty.  Grip strength intact bilaterally.  Sensation intact bilaterally.  Radial pulses equal bilaterally.  Neurological: She is alert and oriented to [place], place, and time. No sensory deficit.  Skin: Skin is warm. No rash noted.    Psychiatric: She has a normal mood and affect.  Nursing note and vitals reviewed.    ED Treatments / Results  Labs (all labs ordered are listed, but only abnormal results are displayed) Labs Reviewed - No data to display  EKG EKG Interpretation  Date/Time:  Sunday June 13 2017 14:38:21 EDT Ventricular Rate:  79 PR Interval:  166 QRS Duration: 94 QT Interval:  368 QTC Calculation: 421 R Axis:   95 Text Interpretation:  Normal sinus rhythm Rightward axis Minimal voltage criteria for LVH, may be normal variant Borderline ECG Confirmed by Tanna Furry [phone]) on 06/13/2017 4:50:58 PM   Radiology No results found.  Procedures Procedures (including critical care time)  Medications Ordered in ED Medications - No data to display   Initial Impression / Assessment and Plan / ED Course  I have reviewed the triage vital signs and the nursing notes.  Pertinent labs & imaging results that were  available during my care of the patient were reviewed by me and considered in my medical decision making (see chart for details).     Patient presenting for evaluation of right sided neck and shoulder pain.  Physical examination, she is neurovascularly intact.  Pain is described as an ache and reproducible on exam.  Doubt fracture dislocation.  I do not believe x-ray will be beneficial at this time.  Likely muscle strain/injury.  Patient unable to take NSAIDs.  Will try course of prednisone and muscle relaxer.  Follow-up with primary care as needed.  Doubt spinal cord compression, myelopathy, infection, or vertebral injury.  Doubt ACS as cause of patient's right-sided neck and shoulder pain.  EKG without signs of STEMI. Additionally, patient has intermittent headaches and nosebleeds.  This is been going on for years.  Currently without symptoms.  No concerning findings on exam other than mild nasal mucosal edema.  Discussed with attending, Dr. Jeneen Rinks agrees to plan.  I doubt this is due to concerning  intracranial pathology.  Follow-up with primary care for further evaluation.  At this time, patient appears safe for discharge.  Return precautions given.  Patient states she understands and agrees to plan.   Final Clinical Impressions(s) / ED Diagnoses   Final diagnoses:  Muscle pain  Acute pain of right shoulder    ED Discharge Orders        Ordered    cyclobenzaprine (FLEXERIL) 5 MG tablet  At bedtime PRN     06/13/17 1645    predniSONE (DELTASONE) 10 MG tablet  Daily     06/13/17 [place], [place], PA-C 06/13/17 1723    Tanna Furry, MD 06/14/17 2358

## 2017-06-13 NOTE — Discharge Instructions (Signed)
Take prednisone as prescribed for the next 5 days. Use Flexeril as needed for pain.  Have caution as this may make you tired or groggy.  Do not drive or operate heavy machinery while taking this medicine. Continue taking Tylenol and tramadol as needed for pain. Use heating pads to help with pain. Follow up with your primary care doctor if your symptoms are not improving. Return to the emergency room if you develop numbness, inability to move your hand or wrist, or any new or concerning symptoms.

## 2017-06-18 ENCOUNTER — Encounter: Payer: Self-pay | Admitting: Internal Medicine

## 2017-06-18 ENCOUNTER — Ambulatory Visit: Payer: 59 | Admitting: Internal Medicine

## 2017-06-18 VITALS — BP 122/80 | HR 106 | Temp 98.9°F | Wt 187.0 lb

## 2017-06-18 DIAGNOSIS — R29898 Other symptoms and signs involving the musculoskeletal system: Secondary | ICD-10-CM

## 2017-06-18 MED ORDER — CYCLOBENZAPRINE HCL 5 MG PO TABS: 5.0000 mg | ORAL_TABLET | Freq: Every evening | ORAL | 0 refills | Status: DC | PRN

## 2017-06-18 MED ORDER — TRAMADOL HCL 50 MG PO TABS: 50.0000 mg | ORAL_TABLET | Freq: Three times a day (TID) | ORAL | 0 refills | Status: DC | PRN

## 2017-06-18 MED ORDER — ALBUTEROL SULFATE HFA 108 (90 BASE) MCG/ACT IN AERS: 2.0000 | INHALATION_SPRAY | Freq: Four times a day (QID) | RESPIRATORY_TRACT | 2 refills | Status: DC | PRN

## 2017-06-18 NOTE — Assessment & Plan Note (Addendum)
Rather sudden onset within the past 5 days. Physical exam most consistent with significant weakness of deltoid, with concern for C4-C5 nerve impingement. Full strength of biceps and triceps, and no weakness of wrist or hand (full grip strength), so C6-8 less likely affected. Rotator cuff tear also included in differential, however would expect at least some degree of pain with movement. Does have weakness with external rotation which could be consistent with suprascapularis injury, however, again, would expect at least some degree of pain. Less concern for CNS etiology given isolated nature of weakness. Adhesive capsulitis unlikely as able to passively move arm. Will proceed with C spine MRI to assess for impingement. Will call patient when results available to discuss next steps. Pending results, may need referral to neurosurg given profound weakness. Can continue current pain regimen of Tramadol and Flexeril. Will complete last dose of prednisone today and do not feel that further steroids are necessary.   Precepted with Dr. Erin Hearing.

## 2017-06-18 NOTE — Progress Notes (Signed)
Disposed of Tramadol 50 mg tablets x's 28 tablets with Christen Bame as witness. Tablets were crushed and throughly mixed with coffee grounds before disposal in trash.Anna Berry, [place]

## 2017-06-18 NOTE — Progress Notes (Signed)
Subjective:   Patient: Anna Berry       Birthdate: Oct 08, 1960       MRN: [id number]      HPI  Anna Berry is a 57 y.o. female presenting for R arm weakness.   R arm weakness Patient seen at [facility] on 03/31 for R shoulder and neck pain. Pain began about 5d prior. Patient thought she slept awkwardly and had a crook in her neck, however the pain did not improve. Denies any preceding trauma or injury. In ED, sx were most consistent with MSK etiology. Imaging was not obtained as patient with no weakness, bony abnormalities, or TTP on exam. She was prescribed Tramadol, Flexeril, and a prednisone burst, and told to f/u with PCP.  Since then, patient has developed profound weakness of the R shoulder. She has not been able to actively raise her arm for the past 5d. She thought this may be due to the muscle relaxer relaxing her muscles too much. She is able to lift her R arm with her L arm, but is not able to lift the R arm alone. Does not have any pain with movement of the arm. Denies wrist or hand weakness. Denies numbness or weakness of the affected shoulder, arm, or hand. Does still have some neck and shoulder pain, which she describes as a soreness or deep throbbing pain, though this has significantly improved. The soreness is not worsened with movement. Has been taking prednisone as prescribed; last dose is today. Has also been taking Flexeril qhs and Tramadol during the day.   Smoking status reviewed. Patient is never smoker.   Review of Systems See HPI.     Objective:  Physical Exam  Constitutional: She is oriented to [name], place, and time and well-developed, well-nourished, and in no distress.  HENT:  Head: Normocephalic and atraumatic.  Pulmonary/Chest: Effort normal. No respiratory distress.  Musculoskeletal:  Significant weakness of R arm. Active ROM of R arm ~45 degrees. Full passive ROM of R arm/shoulder without pain or difficulty. Weakness with external rotation of RUE.  Full strength with internal rotation of RUE. Full biceps and triceps strength bilaterally. Full grip strength and wrist strength bilaterally. Normal sensation to light touch of RUE.   Neurological: She is alert and oriented to [name], place, and time.  Skin: Skin is warm and dry.  Psychiatric: Affect and judgment normal.      Assessment & Plan:  Right arm weakness Rather sudden onset within the past 5 days. Physical exam most consistent with significant weakness of deltoid, with concern for C4-C5 nerve impingement. Full strength of biceps and triceps, and no weakness of wrist or hand (full grip strength), so C6-8 less likely affected. Rotator cuff tear also included in differential, however would expect at least some degree of pain with movement. Does have weakness with external rotation which could be consistent with suprascapularis injury, however, again, would expect at least some degree of pain. Less concern for CNS etiology given isolated nature of weakness. Adhesive capsulitis unlikely as able to passively move arm. Will proceed with C spine MRI to assess for impingement. Will call patient when results available to discuss next steps. Pending results, may need referral to neurosurg given profound weakness. Can continue current pain regimen of Tramadol and Flexeril. Will complete last dose of prednisone today and do not feel that further steroids are necessary.   Precepted with Dr. Erin Hearing.    Adin Hector, MD, MPH PGY-3 [facility] Pager  [phone]  

## 2017-06-18 NOTE — Patient Instructions (Addendum)
It was nice meeting you today Anna Berry!  You can continue taking Tramadol and Flexeril as needed for pain. Please take your last prednisone tablets today.   If you start to notice weakness in your hand, or if your pain worsens, please call our office or go to the emergency room.   I will call you when I have the results of the MRI.   If you have any questions or concerns, please feel free to call the clinic.   Be well,  Dr. Avon Gully

## 2017-06-25 ENCOUNTER — Ambulatory Visit (HOSPITAL_COMMUNITY)
Admission: RE | Admit: 2017-06-25 | Discharge: 2017-06-25 | Disposition: A | Discharge status: Home / Self Care | Payer: 59 | Source: Ambulatory Visit | Attending: Family Medicine | Admitting: Family Medicine

## 2017-06-25 DIAGNOSIS — R29898 Other symptoms and signs involving the musculoskeletal system: Secondary | ICD-10-CM | POA: Diagnosis present

## 2017-06-25 DIAGNOSIS — M50222 Other cervical disc displacement at C5-C6 level: Secondary | ICD-10-CM | POA: Diagnosis not present

## 2017-06-25 DIAGNOSIS — M4802 Spinal stenosis, cervical region: Secondary | ICD-10-CM | POA: Diagnosis not present

## 2017-06-28 ENCOUNTER — Telehealth: Payer: Self-pay | Admitting: *Deleted

## 2017-06-28 DIAGNOSIS — R29898 Other symptoms and signs involving the musculoskeletal system: Secondary | ICD-10-CM

## 2017-06-28 NOTE — Telephone Encounter (Signed)
Returned patient's call regarding MRI results. No answer and no option to leave voicemail. Will call again later. Am referring patient to neurosurgery given findings, so will place that referral now.   Adin Hector, MD, MPH PGY-3 [facility] Pager [phone]

## 2017-06-28 NOTE — Telephone Encounter (Signed)
Pt calling for results of MRI. [place], Anna Berry, CMA

## 2017-06-30 ENCOUNTER — Telehealth: Payer: Self-pay

## 2017-06-30 NOTE — Telephone Encounter (Signed)
Pt calling for MRI results. Her call back [phone] Wallace Cullens, RN

## 2017-07-01 NOTE — Telephone Encounter (Signed)
Called patient regarding results. She was at work, but her husband answered the phone. Discussed results, and referral to neurosurgery that has been placed. Husband appreciative. Encouraged him to have patient call with any additional questions.   Adin Hector, MD, MPH PGY-3 [facility] Pager [phone]

## 2017-07-28 DIAGNOSIS — M7581 Other shoulder lesions, right shoulder: Secondary | ICD-10-CM | POA: Diagnosis not present

## 2017-09-09 ENCOUNTER — Ambulatory Visit (INDEPENDENT_AMBULATORY_CARE_PROVIDER_SITE_OTHER): Payer: Self-pay

## 2017-09-09 ENCOUNTER — Ambulatory Visit (INDEPENDENT_AMBULATORY_CARE_PROVIDER_SITE_OTHER): Payer: 59 | Admitting: Orthopaedic Surgery

## 2017-09-09 DIAGNOSIS — M25511 Pain in right shoulder: Secondary | ICD-10-CM

## 2017-09-09 DIAGNOSIS — M1711 Unilateral primary osteoarthritis, right knee: Secondary | ICD-10-CM

## 2017-09-09 MED ORDER — DICLOFENAC SODIUM 1 % TD GEL: 2.0000 g | Freq: Four times a day (QID) | TRANSDERMAL | 5 refills | Status: DC

## 2017-09-09 NOTE — Progress Notes (Signed)
Office Visit Note   Patient: Anna Berry           Date of Birth: Jun 12, 1960           MRN: [id number] Visit Date: 09/09/2017              Requested by: Traci Sermon, PA-C [place], [place] [address] PCP: Nuala Alpha, DO   Assessment & Plan: Visit Diagnoses:  1. Right shoulder pain, unspecified chronicity   2. Primary osteoarthritis of right knee     Plan: Impression is right shoulder pain and advanced tricompartmental right knee degenerative disease.  Prescription for diclofenac.  We discussed treatment options in terms of her knee which included knee replacement and the associated risks and benefits.  She will think about this.  In terms of her shoulders and she is not really having pain we are just going to have her do home exercises for the next several weeks to see if there is any improvement in this.  If not we may need to consider MRI to rule out rotator cuff pathology.  Questions encouraged and answered.  Follow-up as needed.  Follow-Up Instructions: Return if symptoms worsen or fail to improve.   Orders:  Orders Placed This Encounter  Procedures  . XR Shoulder Right  . XR KNEE 3 VIEW RIGHT   Meds ordered this encounter  Medications  . diclofenac sodium (VOLTAREN) 1 % GEL    Sig: Apply 2 g topically 4 (four) times daily.    Dispense:  1 Tube    Refill:  5      Procedures: No procedures performed   Clinical Data: No additional findings.   Subjective: Chief Complaint  Patient presents with  . Right Shoulder - Pain  . Right Knee - Pain    Anna Berry is a 57 year old female who comes in with right shoulder pain that has been subsided now but mainly has weakness.  Denies any numbness and tingling.  She also has right knee pain that has been going on for years.  She has had previous cortisone injections but no longer give her any relief.  She has kidney disease therefore she cannot take NSAIDs.   Review of Systems  Constitutional:  Negative.   HENT: Negative.   Eyes: Negative.   Respiratory: Negative.   Cardiovascular: Negative.   Endocrine: Negative.   Musculoskeletal: Negative.   Neurological: Negative.   Hematological: Negative.   Psychiatric/Behavioral: Negative.   All other systems reviewed and are negative.    Objective: Vital Signs: There were no vitals taken for this visit.  Physical Exam  Constitutional: She is oriented to [place], place, and time. She appears well-developed and well-nourished.  HENT:  Head: Normocephalic and atraumatic.  Eyes: EOM are normal.  Neck: Neck supple.  Pulmonary/Chest: Effort normal.  Abdominal: Soft.  Neurological: She is alert and oriented to [place], place, and time.  Skin: Skin is warm. Capillary refill takes less than 2 seconds.  Psychiatric: She has a normal mood and affect. Her behavior is normal. Judgment and thought content normal.  Nursing note and vitals reviewed.   Ortho Exam Right shoulder exam shows a mildly weak empty can testing.  No impingement signs.  Normal range of motion. Right knee exam shows a valgus deformity.  Good range of motion.  Collaterals and cruciates are stable. Specialty Comments:  No specialty comments available.  Imaging: Xr Knee 3 View Right  Result Date: 09/09/2017 Advanced tricompartmental degenerative joint disease with  valgus deformity.  Xr Shoulder Right  Result Date: 09/09/2017 Significant downward sloping acromion.  No acute or structural abnormalities.  Mild osteoarthritis.    PMFS History: Patient Active Problem List   Diagnosis Date Noted  . Right arm weakness 06/18/2017  . Primary osteoarthritis of left knee 12/18/2016  . 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 Obesity (BMI 30-39.9) 05/11/2012  . 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 Right knee pain 06/22/2011  . Depression 11/26/2010  . 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 Asthma 12/29/2009  . POSTMENOPAUSAL SYNDROME  08/26/2009  . Osteoarthritis of right knee 08/26/2009  . HEMATURIA UNSPECIFIED 05/21/2008  . ALLERGIC RHINITIS 05/29/2007  . SICKLE CELL TRAIT 05/13/2006   Past Medical History:  Diagnosis Date  . Allergy   . Arthritis   . Asthma    allergies  . Obesity   . Plantar fasciitis   . Postmenopausal   . 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 Sickle cell anemia (HCC)    sickle cell trait  . Sickle cell trait ([facility])     Family History  Problem Relation Age of Onset  . Stomach cancer Maternal Grandmother 78  . Stroke Maternal Grandmother   . Diabetes Father   . Kidney disease Father   . Colon cancer Neg Hx     Past Surgical History:  Procedure Laterality Date  . ABDOMINAL HYSTERECTOMY  2004  . APPENDECTOMY  2006  . [place]   x2   Social History   Occupational History  . Not on file  Tobacco Use  . Smoking status: Never Smoker  . Smokeless tobacco: Never Used  Substance and Sexual Activity  . Alcohol use: No    Alcohol/week: 0.0 oz  . Drug use: No  . Sexual activity: Yes

## 2017-09-27 ENCOUNTER — Telehealth (INDEPENDENT_AMBULATORY_CARE_PROVIDER_SITE_OTHER): Payer: Self-pay

## 2017-09-27 NOTE — Telephone Encounter (Signed)
Faxed PA Form- sent a copy to scan.

## 2017-09-29 ENCOUNTER — Telehealth (INDEPENDENT_AMBULATORY_CARE_PROVIDER_SITE_OTHER): Payer: Self-pay | Admitting: Orthopaedic Surgery

## 2017-09-29 NOTE — Telephone Encounter (Signed)
Patient called advised the [facility] pharmacy need prior auth before they will fill her Rx. The number to contact patient is [phone]

## 2017-09-30 NOTE — Telephone Encounter (Signed)
Tried to call pt no answer lm on vm . Prior Anna Berry was faxed on 09/27/17 pending insurance approval.

## 2017-11-25 ENCOUNTER — Other Ambulatory Visit: Payer: Self-pay | Admitting: Internal Medicine

## 2017-12-02 DIAGNOSIS — I10 Essential (primary) hypertension: Secondary | ICD-10-CM | POA: Diagnosis not present

## 2017-12-02 DIAGNOSIS — N390 Urinary tract infection, site not specified: Secondary | ICD-10-CM | POA: Diagnosis not present

## 2017-12-02 DIAGNOSIS — N172 Acute kidney failure with medullary necrosis: Secondary | ICD-10-CM | POA: Diagnosis not present

## 2017-12-21 ENCOUNTER — Other Ambulatory Visit: Payer: Self-pay | Admitting: Family Medicine

## 2017-12-21 DIAGNOSIS — N390 Urinary tract infection, site not specified: Secondary | ICD-10-CM | POA: Diagnosis not present

## 2017-12-21 DIAGNOSIS — Z1231 Encounter for screening mammogram for malignant neoplasm of breast: Secondary | ICD-10-CM

## 2018-01-13 ENCOUNTER — Ambulatory Visit
Admission: RE | Admit: 2018-01-13 | Discharge: 2018-01-13 | Disposition: A | Discharge status: Home / Self Care | Payer: 59 | Source: Ambulatory Visit | Attending: Family Medicine | Admitting: Family Medicine

## 2018-01-13 DIAGNOSIS — Z1231 Encounter for screening mammogram for malignant neoplasm of breast: Secondary | ICD-10-CM

## 2018-01-13 DIAGNOSIS — N390 Urinary tract infection, site not specified: Secondary | ICD-10-CM | POA: Diagnosis not present

## 2018-01-14 ENCOUNTER — Ambulatory Visit (INDEPENDENT_AMBULATORY_CARE_PROVIDER_SITE_OTHER): Payer: 59 | Admitting: Family Medicine

## 2018-01-14 VITALS — BP 120/80 | HR 75 | Temp 98.3°F | Wt 184.0 lb

## 2018-01-14 DIAGNOSIS — M79672 Pain in left foot: Secondary | ICD-10-CM | POA: Diagnosis not present

## 2018-01-14 DIAGNOSIS — M79671 Pain in right foot: Secondary | ICD-10-CM

## 2018-01-14 DIAGNOSIS — M1712 Unilateral primary osteoarthritis, left knee: Secondary | ICD-10-CM | POA: Diagnosis not present

## 2018-01-14 DIAGNOSIS — Z23 Encounter for immunization: Secondary | ICD-10-CM

## 2018-01-14 MED ORDER — DICLOFENAC SODIUM 3 % EX CREA: 1.0000 "application " | TOPICAL_CREAM | Freq: Two times a day (BID) | CUTANEOUS | 0 refills | Status: DC | PRN

## 2018-01-14 MED ORDER — METHYLPREDNISOLONE ACETATE 40 MG/ML IJ SUSP
40.0000 mg | Freq: Once | INTRAMUSCULAR | Status: AC
  Administered 2018-01-14: 40 mg via INTRAMUSCULAR

## 2018-01-14 NOTE — Patient Instructions (Addendum)
It was great to see you today! Thank you for letting me participate in your care!  Today, we discussed your bilateral knee arthritis. I gave you a steroid injection today for temporary relief but I do recommend you proceed with the total knee replacement surgery. Please ice your knees when you get home from work and before you go to bed for 15 minutes at a time.You can also wear compression sleeves to help.   Continue wearing your compression socks during the day and elevate them at night, ice them at night. If your swelling is not improved with these measures in one week please call me.  For your feet I want you to do the daily stretches for your plantar fascitis for the next two weeks EVERYDAY. I have sent you a referral to sports medicine for custom orthotics as I think they will help you.   Be well, Harolyn Rutherford, DO PGY-2, [facility]

## 2018-01-14 NOTE — Progress Notes (Signed)
Subjective: Chief Complaint  Patient presents with  . Annual Exam     HPI: Anna Berry is a 57 y.o. presenting to clinic today to discuss the following:  Bilateral Knee Pain Anna Berry is a 57y/o female with known Osteoarthritis in both knees who presents today due to knee pain and wanted a second opinion on how to proceed with dealing with her painful condition. She had recently been to see an orthopedic doctor who recommended both knees get replaced with TKR surgery. Anna Berry wanted to know if I agreed with the assessment. Given that her pain is affecting her daily quality of life and that oral medications, topicals, steroid shots, and lubricating injections have not helped I did inform her I agree with the assessment of the orthopedic surgeon.   In the past week her left knee has been extremely tender and painful. She was agreeable to getting a shot of corticosteroid today to see if it will help temporarily with the pain.  No fever, chills, skin changes, nausea, or vomiting.  Bilateral Foot Pain Patient is having bilateral foot pain from standing at work for more than 8-9 hours at a time. She has a history of plantar fascitis and has been doing the stretches she was given in the past with some relief. However, her pain in the past week is coming after standing for prolonged periods of time and not worse in the morning. She is not having heel tenderness. She is also having feet swelling after standing on her feet for long periods of time. She is wearing compression stockings at work which help her swelling.  ROS noted in HPI.   Past Medical, Surgical, Social, and Family History Reviewed & Updated per EMR.   Pertinent Historical Findings include:   Social History   Tobacco Use  Smoking Status Never Smoker  Smokeless Tobacco Never Used   Objective: BP 120/80 (BP Location: Left Arm, Patient Position: Sitting, Cuff Size: Normal)   Pulse 75   Temp 98.3 F (36.8 C)  (Oral)   Wt 184 lb (83.5 kg)   SpO2 96%   BMI 28.82 kg/m  Vitals and nursing notes reviewed  Physical Exam Gen: Alert and Oriented x 3, NAD Bilateral Knee Exam: Mild swelling bilaterally, no ecchymosis, no erythema, no warmth. TTP at the medial joint line on the left knee, otherwise non-tender. Crepitus in both knees. Decreased flexion and extension in both knees bilaterally. Negative ant/post drawer testing, negative varus/valgus testing, negative McMurray's bilaterally. Bilateral Foot Exam: Obvious pedal edema that does not go past the ankle, no ecchymosis, erythema, or warmth. Non-tender bilaterally, FROM bilaterally. Negative ant drawer test, 0 talar tilt, [place] ankle score 0 bilaterally. Moderate Pes planus noted bilaterally Ext: no clubbing, cyanosis, +2 pedal edema Neuro: No gross deficits, 5/5 LE strength with gross sensation intact bilaterally, +2 LE reflexes bilaterally Skin: warm, dry, intact, no rashes  Left Knee Injection: Verbal consent was obtained after discussing the risks and benefits of the procedure with the patient. The anterior knee was cleansed in a sterile fashion with betadine and alcohol wipes. 40 mg Depo-medrol and 3 cc 1% Lidocaine was injected using an anterolateral approach using a 5 cc syringe and 25 gauge 11/2 in needle. No complications were encountered. Minimal blood loss. A band aid was applied.   No results found for this or any previous visit (from the past 72 hour(s)).  Assessment/Plan:  Primary osteoarthritis of left knee Corticosteroid injection in left knee today as  temporary pain relief. Patient will proceed with Left TKR first, then get the Right TKR. - Follow up as needed. - In the meantime, to help her pain be well controlled I did inform patient over the next 5-7 days to ice the knee BID along with heat, compression sleeve while at work and can use Capzacin cream TID. - Sent prescription for generic Voltaren gel as patient has decreased  kidney function so trying to avoid oral NSAIDs.  Bilateral foot pain - I suspect patient will benefit from custom orthotics as patient has mild pes planus and is experiencing foot pain after standing for prolonged periods of time with foot swelling. Cont compression socks to decrease swelling and elevate feet at night. - Amb referral to [facility]   PATIENT EDUCATION PROVIDED: See AVS    Diagnosis and plan along with any newly prescribed medication(s) were discussed in detail with this patient today. The patient verbalized understanding and agreed with the plan. Patient advised if symptoms worsen return to clinic or ER.   Health Maintainance:   Orders Placed This Encounter  Procedures  . Flu Vaccine QUAD 36+ mos IM    No orders of the defined types were placed in this encounter.    Harolyn Rutherford, DO 01/14/2018, 10:28 AM PGY-2 [place]

## 2018-01-18 ENCOUNTER — Telehealth: Payer: Self-pay

## 2018-01-18 DIAGNOSIS — M7742 Metatarsalgia, left foot: Secondary | ICD-10-CM

## 2018-01-18 DIAGNOSIS — M7741 Metatarsalgia, right foot: Secondary | ICD-10-CM | POA: Insufficient documentation

## 2018-01-18 NOTE — Telephone Encounter (Signed)
Denied per CoverMyMeds. Note routed to PCP.   Danley Danker, RN [facility] RN)

## 2018-01-18 NOTE — Telephone Encounter (Signed)
Received fax from [place], [place] needed on Diclofenac gel.  Clinical questions submitted via Cover My Meds.  Waiting on response, could take up to 72 hours.  Cover My Meds info: Key: [id number]   Esau Grew, RN

## 2018-01-18 NOTE — Assessment & Plan Note (Signed)
Corticosteroid injection in left knee today as temporary pain relief. Patient will proceed with Left TKR first, then get the Right TKR. - Follow up as needed. - In the meantime, to help her pain be well controlled I did inform patient over the next 5-7 days to ice the knee BID along with heat, compression sleeve while at work and can use Capzacin cream TID. - Sent prescription for generic Voltaren gel as patient has decreased kidney function so trying to avoid oral NSAIDs.

## 2018-01-18 NOTE — Assessment & Plan Note (Signed)
-   I suspect patient will benefit from custom orthotics as patient has mild pes planus and is experiencing foot pain after standing for prolonged periods of time with foot swelling. Cont compression socks to decrease swelling and elevate feet at night. - Amb referral to [facility] clinic

## 2018-01-19 ENCOUNTER — Encounter: Payer: Self-pay | Admitting: Sports Medicine

## 2018-01-19 ENCOUNTER — Ambulatory Visit (INDEPENDENT_AMBULATORY_CARE_PROVIDER_SITE_OTHER): Payer: 59 | Admitting: Sports Medicine

## 2018-01-19 VITALS — BP 134/80 | Ht 66.5 in | Wt 184.0 lb

## 2018-01-19 DIAGNOSIS — M7741 Metatarsalgia, right foot: Secondary | ICD-10-CM

## 2018-01-19 DIAGNOSIS — M79672 Pain in left foot: Secondary | ICD-10-CM

## 2018-01-19 DIAGNOSIS — M79671 Pain in right foot: Secondary | ICD-10-CM | POA: Diagnosis not present

## 2018-01-19 DIAGNOSIS — M7742 Metatarsalgia, left foot: Secondary | ICD-10-CM

## 2018-01-19 DIAGNOSIS — M1712 Unilateral primary osteoarthritis, left knee: Secondary | ICD-10-CM | POA: Diagnosis not present

## 2018-01-19 MED ORDER — DICLOFENAC SODIUM 2 % TD SOLN: TRANSDERMAL | 3 refills | Status: DC

## 2018-01-19 NOTE — Patient Instructions (Addendum)
Green inserts women's 9-10 given today with Metatarsal pads. Try these out for 3-4 weeks. You may come in to have them modify the position of the pads if needed. We will also send in a topical 2% Pennsaid for you to further get some relief.

## 2018-01-19 NOTE — Assessment & Plan Note (Signed)
-   improving after corticosteroid injection - sent in an RX for topical pennsaid 2% to be applied twice daily for the next 4-6 weeks - she was unable to pick up capsaicin or voltaren 2/2 to cost - she is intolerant of NSAIDs - management per [facility] as she is a candidate for TKA

## 2018-01-19 NOTE — Progress Notes (Signed)
Anna Berry - 57 y.o. female MRN [id number]  Date of birth: 11-Sep-1960   Chief complaint: Bilateral foot pain  SUBJECTIVE:    History of present illness: Anna Berry is a 57 year old female who is referred by the family practice clinic with a chief complaint of bilateral foot pain.  She states that she has had foot pain for several years.  Today, her pain is primarily located on her metatarsal heads bilaterally.  She was born with pes planus and has always tried to buy shoes that have good arch support for her.  She also does wear dress boots on occasion which hurt her feet.  When she is at home, she tries to walk barefoot as her feet constantly ache.  She does have mild pain in her longitudinal arches on a daily basis.  Both feet seem to be equally bothersome.  Her pain is rated 3 out of 10 and is worse with prolonged walking.  It does improve with relative rest.  She has a history of plantar fasciitis status post treatment for this nonsurgically.  She also has bilateral knee pain which is being treated at [facility].  At some point, she is going to get total knee arthroplasties with her right being worse than her left.  She most recently received a knee injection last week at the family practice clinic and reports improvement in her overall range of motion and pain of her left knee.  Denies any numbness or tingling of her extremities.  Denies any significant ankle pain.  No weakness.   Review of systems:  As stated above   Past medical history: Renal papillary necrosis, depression, obesity, sickle cell trait Past surgical history: Appendectomy, hysterectomy Past family history: Father had diabetes and kidney disease, maternal grandfather had gastric cancer and a CVA Social history: Non-smoker  Medications: Albuterol, vitamin D, Claritin Allergies: Acetaminophen, morphine, oral NSAIDs  OBJECTIVE:  Physical exam: Vital signs are reviewed. BP 134/80   Ht 5' 6.5" (1.689 m)   Wt  184 lb (83.5 kg)   BMI 29.25 kg/m   Gen.: Alert, oriented, appears stated age, in no apparent distress HEENT: Moist oral mucosa Respiratory: Normal respirations, able to speak in full sentences Cardiac: Regular rate, distal pulses 2+ Integumentary: No rashes or ecchymoses Neurologic: nonfocal, bilateral lower extremity's are neurovascular intact Gait: normal without associated limp Musculoskeletal: Inspection of the bilateral feet demonstrates pes planus mild with prominent metatarsal heads and slight hallux valgus deformities bilaterally.  Slight collapse to her medial longitudinal arches bilaterally.  She has tenderness to palpation over metatarsal heads on both feet.  She has trace tenderness in her medial longitudinal arches.  No calcaneal tenderness.  She has full range of motion ankle dorsiflexion plantarflexion.  Strength testing 5 out of 5 in the lower extremities bilaterally.  She is able to go up on her forefeet without any significant pain or problems.  Negative anterior drawer of the ankle.  Negative ankle inversion eversion testing.   Inspection of the left knee demonstrates no obvious swelling or deformity.  She does have lateral joint line tenderness to palpation.  She has full range of motion in flexion and extension.  Strength testing 5 out of 5 in flexion extension.  Left knee is stable to ligamentous testing.    ASSESSMENT & PLAN: Metatarsalgia of both feet - green temporary orthotics given women's size 9-10 - small metatarsal pads applied to offload metatarsal heads - advised to try these out for the next 2-4 weeks to  see if she has improvement  Primary osteoarthritis of left knee - improving after corticosteroid injection - sent in an RX for topical pennsaid 2% to be applied twice daily for the next 4-6 weeks - she was unable to pick up capsaicin or voltaren 2/2 to cost - she is intolerant of NSAIDs - management per [facility] as she is a candidate for TKA   Meds  ordered this encounter  Medications  . Diclofenac Sodium 2 % SOLN    Sig: 2 pumps BID    Dispense:  112 g    Refill:  3      Clydene Laming, DO Sports Medicine [facility] [place]  I was the preceptor for this visit and available for immediate consultation. Lilia Argue, DO

## 2018-01-19 NOTE — Assessment & Plan Note (Signed)
-   green temporary orthotics given women's size 9-10 - small metatarsal pads applied to offload metatarsal heads - advised to try these out for the next 2-4 weeks to see if she has improvement

## 2018-01-26 ENCOUNTER — Encounter: Payer: Self-pay | Admitting: Family Medicine

## 2018-02-02 DIAGNOSIS — N390 Urinary tract infection, site not specified: Secondary | ICD-10-CM | POA: Diagnosis not present

## 2018-02-08 ENCOUNTER — Encounter: Payer: Self-pay | Admitting: Family Medicine

## 2018-04-11 ENCOUNTER — Telehealth: Payer: Self-pay | Admitting: Family Medicine

## 2018-04-11 ENCOUNTER — Other Ambulatory Visit: Payer: Self-pay | Admitting: Family Medicine

## 2018-04-11 DIAGNOSIS — M1711 Unilateral primary osteoarthritis, right knee: Secondary | ICD-10-CM

## 2018-04-11 NOTE — Telephone Encounter (Signed)
Pt called and LVM wanting to start the process to have knee replacement surgery. jw

## 2018-04-11 NOTE — Progress Notes (Signed)
Ambulatory referral to orthopedist as patient desires to explore the option of TKR

## 2018-05-31 ENCOUNTER — Other Ambulatory Visit: Payer: Self-pay

## 2018-05-31 ENCOUNTER — Ambulatory Visit (INDEPENDENT_AMBULATORY_CARE_PROVIDER_SITE_OTHER): Payer: 59

## 2018-05-31 ENCOUNTER — Encounter (INDEPENDENT_AMBULATORY_CARE_PROVIDER_SITE_OTHER): Payer: Self-pay | Admitting: Orthopaedic Surgery

## 2018-05-31 ENCOUNTER — Ambulatory Visit (INDEPENDENT_AMBULATORY_CARE_PROVIDER_SITE_OTHER): Payer: 59 | Admitting: Orthopaedic Surgery

## 2018-05-31 DIAGNOSIS — M1711 Unilateral primary osteoarthritis, right knee: Secondary | ICD-10-CM

## 2018-05-31 NOTE — Progress Notes (Addendum)
Office Visit Note   Patient: Anna Berry           Date of Birth: [birth year]           MRN: [id number] Visit Date: 05/31/2018              Requested by: Anna Reasons, MD [address] [place], [place] [address] PCP: Anna Alpha, DO   Assessment & Plan: Visit Diagnoses:  1. 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  Patient has failed conservative treatment as previously documented and wishes to proceed with right total knee replacement.  She is aware of the risks and benefits of surgery as well as rehab and recovery.  She denies a history of DVT or nickel allergy.  She is not allowed to take aspirin due to kidney disease therefore we will place her on Xarelto postoperatively.  Follow-Up Instructions: Return if symptoms worsen or fail to improve.   Orders:  Orders Placed This Encounter  Procedures  . XR Knee Complete 4 Views Right   No orders of the defined types were placed in this encounter.     Procedures: No procedures performed   Clinical Data: No additional findings.   Subjective: Chief Complaint  Patient presents with  . Right Knee - Pain    Anna Berry is a very pleasant 58 year old female who returns today for her right knee pain.  She states that she now has chronic right knee pain that is severely affecting her quality of life and function.  She endorses generalized stiffness of the knee and a chronic aching pain.  She has not received much relief from conservative treatments.  She has seen her primary care doctor in the past and received cortisone injections as well as cortisone injection with [place].  She has not received relief from those measures.  She is also tried activity restrictions and modifications to no avail.   Review of Systems  Constitutional: Negative.   HENT: Negative.   Eyes: Negative.   Respiratory: Negative.    Cardiovascular: Negative.   Endocrine: Negative.   Musculoskeletal: Negative.   Neurological: Negative.   Hematological: Negative.   Psychiatric/Behavioral: Negative.   All other systems reviewed and are negative.    Objective: Vital Signs: There were no vitals taken for this visit.  Physical Exam Vitals signs and nursing note reviewed.  Constitutional:      Appearance: She is well-developed.  Pulmonary:     Effort: Pulmonary effort is normal.  Skin:    General: Skin is warm.     Capillary Refill: Capillary refill takes less than 2 seconds.  Neurological:     Mental Status: She is alert and oriented to Pals, place, and time.  Psychiatric:        Behavior: Behavior normal.        Thought Content: Thought content normal.        Judgment: Judgment normal.     Ortho Exam Right knee exam shows a slight valgus deformity.  Range of motion is 0 to 100 degrees.  Stable collaterals and cruciates. Specialty Comments:  No specialty comments available.  Imaging: Xr Knee Complete 4 Views Right  Result Date: 05/31/2018 Tricompartmental degenerative joint disease with valgus alignment    PMFS History: Patient Active Problem List   Diagnosis Date Noted  . Metatarsalgia of both feet 01/18/2018  . Right arm weakness 06/18/2017  . Primary osteoarthritis  of left knee 12/18/2016  . 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 Obesity (BMI 30-39.9) 05/11/2012  . 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 Right knee pain 06/22/2011  . Depression 11/26/2010  . 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 Asthma 12/29/2009  . POSTMENOPAUSAL SYNDROME 08/26/2009  . Osteoarthritis of right knee 08/26/2009  . HEMATURIA UNSPECIFIED 05/21/2008  . ALLERGIC RHINITIS 05/29/2007  . SICKLE CELL TRAIT 05/13/2006   Past Medical History:  Diagnosis Date  . Allergy   . Arthritis   . Asthma    allergies  . Obesity   . Plantar fasciitis   . Postmenopausal   . Renal  papillary necrosis (Anna Berry)   . Sickle cell anemia (HCC)    sickle cell trait  . Sickle cell trait (HCC)     Family History  Problem Relation Age of Onset  . Stomach cancer Maternal Grandmother 78  . Stroke Maternal Grandmother   . Diabetes Father   . Kidney disease Father   . Colon cancer Neg Hx     Past Surgical History:  Procedure Laterality Date  . ABDOMINAL HYSTERECTOMY  2004  . APPENDECTOMY  2006  . [place]   x2   Social History   Occupational History  . Not on file  Tobacco Use  . Smoking status: Never Smoker  . Smokeless tobacco: Never Used  Substance and Sexual Activity  . Alcohol use: No    Alcohol/week: 0.0 standard drinks  . Drug use: No  . Sexual activity: Yes

## 2018-06-30 ENCOUNTER — Other Ambulatory Visit: Payer: Self-pay

## 2018-06-30 MED ORDER — ALBUTEROL SULFATE HFA 108 (90 BASE) MCG/ACT IN AERS: 2.0000 | INHALATION_SPRAY | Freq: Four times a day (QID) | RESPIRATORY_TRACT | 2 refills | Status: DC | PRN

## 2018-07-25 ENCOUNTER — Encounter (HOSPITAL_COMMUNITY): Payer: Self-pay

## 2018-07-25 NOTE — Patient Instructions (Addendum)
DUE TO COVID-19 NO VISITORS ARE ALLOWED IN THE HOSPITAL AT THIS TIME   COVID SWAB TESTING MUST BE COMPLETED ON: Tuesday, Jul 26, 2018    Your procedure is scheduled on: Friday, Jul 29, 2018   Surgery Time: 12:15PM-2:37PM   Report to [place]  Entrance    Report to admitting at 9:45 AM   Call this number if you have problems the morning of surgery [phone]   Do not eat food:After Midnight.   Complete one Ensure drink the morning of surgery at 4:30AM the day of surgery.   Brush your teeth the morning of surgery.   Do NOT smoke after Midnight   Take these medicines the morning of surgery with A SIP OF WATER: Loratadine if needed   Use Eye drops if needed   Bring Asthma Inhaler day of surgery                               You may not have any metal on your body including hair pins, jewelry, and body piercings             Do not wear make-up, lotions, powders, perfumes/cologne, or deodorant             Do not wear nail polish.  Do not shave  48 hours prior to surgery.                 Do not bring valuables to the hospital. [place].   Contacts, dentures or bridgework may not be worn into surgery.   Leave suitcase in the car. After surgery it may be brought to your room.   Special Instructions: Bring a copy of your healthcare power of attorney and living will documents         the day of surgery if you haven't scanned them in before.              Please read over the following fact sheets you were given:  [facility] - Preparing for Surgery Before surgery, you can play an important role.  Because skin is not sterile, your skin needs to be as free of germs as possible.  You can reduce the number of germs on your skin by washing with CHG (chlorahexidine gluconate) soap before surgery.  CHG is an antiseptic cleaner which kills germs and bonds with the skin to continue killing germs even after washing. Please  DO NOT use if you have an allergy to CHG or antibacterial soaps.  If your skin becomes reddened/irritated stop using the CHG and inform your nurse when you arrive at Short Stay. Do not shave (including legs and underarms) for at least 48 hours prior to the first CHG shower.  You may shave your face/neck.  Please follow these instructions carefully:  1.  Shower with CHG Soap the night before surgery and the  morning of surgery.  2.  If you choose to wash your hair, wash your hair first as usual with your normal  shampoo.  3.  After you shampoo, rinse your hair and body thoroughly to remove the shampoo.                             4.  Use CHG as you would any  other liquid soap.  You can apply chg directly to the skin and wash.  Gently with a scrungie or clean washcloth.  5.  Apply the CHG Soap to your body ONLY FROM THE NECK DOWN.   Do   not use on face/ open                           Wound or open sores. Avoid contact with eyes, ears mouth and   genitals (private parts).                       Wash face,  Genitals (private parts) with your normal soap.             6.  Wash thoroughly, paying special attention to the area where your    surgery  will be performed.  7.  Thoroughly rinse your body with warm water from the neck down.  8.  DO NOT shower/wash with your normal soap after using and rinsing off the CHG Soap.                9.  Pat yourself dry with a clean towel.            10.  Wear clean pajamas.            11.  Place clean sheets on your bed the night of your first shower and do not  sleep with pets. Day of Surgery : Do not apply any lotions/deodorants the morning of surgery.  Please wear clean clothes to the hospital/surgery center.  FAILURE TO FOLLOW THESE INSTRUCTIONS MAY RESULT IN THE CANCELLATION OF YOUR SURGERY  PATIENT SIGNATURE_________________________________  NURSE  SIGNATURE__________________________________  ________________________________________________________________________   Anna Berry  An incentive spirometer is a tool that can help keep your lungs clear and active. This tool measures how well you are filling your lungs with each breath. Taking long deep breaths may help reverse or decrease the chance of developing breathing (pulmonary) problems (especially infection) following:  A long period of time when you are unable to move or be active. BEFORE THE PROCEDURE   If the spirometer includes an indicator to show your best effort, your nurse or respiratory therapist will set it to a desired goal.  If possible, sit up straight or lean slightly forward. Try not to slouch.  Hold the incentive spirometer in an upright position. INSTRUCTIONS FOR USE  1. Sit on the edge of your bed if possible, or sit up as far as you can in bed or on a chair. 2. Hold the incentive spirometer in an upright position. 3. Breathe out normally. 4. Place the mouthpiece in your mouth and seal your lips tightly around it. 5. Breathe in slowly and as deeply as possible, raising the piston or the ball toward the top of the column. 6. Hold your breath for 3-5 seconds or for as long as possible. Allow the piston or ball to fall to the bottom of the column. 7. Remove the mouthpiece from your mouth and breathe out normally. 8. Rest for a few seconds and repeat Steps 1 through 7 at least 10 times every 1-2 hours when you are awake. Take your time and take a few normal breaths between deep breaths. 9. The spirometer may include an indicator to show your best effort. Use the indicator as a goal to work toward during each repetition. 10. After each set of 10 deep breaths,  practice coughing to be sure your lungs are clear. If you have an incision (the cut made at the time of surgery), support your incision when coughing by placing a pillow or rolled up towels firmly  against it. Once you are able to get out of bed, walk around indoors and cough well. You may stop using the incentive spirometer when instructed by your caregiver.  RISKS AND COMPLICATIONS  Take your time so you do not get dizzy or light-headed.  If you are in pain, you may need to take or ask for pain medication before doing incentive spirometry. It is harder to take a deep breath if you are having pain. AFTER USE  Rest and breathe slowly and easily.  It can be helpful to keep track of a log of your progress. Your caregiver can provide you with a simple table to help with this. If you are using the spirometer at home, follow these instructions: [place] IF:   You are having difficultly using the spirometer.  You have trouble using the spirometer as often as instructed.  Your pain medication is not giving enough relief while using the spirometer.  You develop fever of 100.5 F (38.1 C) or higher. SEEK IMMEDIATE MEDICAL CARE IF:   You cough up bloody sputum that had not been present before.  You develop fever of 102 F (38.9 C) or greater.  You develop worsening pain at or near the incision site. MAKE SURE YOU:   Understand these instructions.  Will watch your condition.  Will get help right away if you are not doing well or get worse. Document Released: 07/13/2006 Document Revised: 05/25/2011 Document Reviewed: 09/13/2006 Exit[employer] Patient Information 2014 [employer], [place].   ________________________________________________________________________  WHAT IS A BLOOD TRANSFUSION? Blood Transfusion Information  A transfusion is the replacement of blood or some of its parts. Blood is made up of multiple cells which provide different functions.  Red blood cells carry oxygen and are used for blood loss replacement.  White blood cells fight against infection.  Platelets control bleeding.  Plasma helps clot blood.  Other blood products are available for  specialized needs, such as hemophilia or other clotting disorders. BEFORE THE TRANSFUSION  Who gives blood for transfusions?   Healthy volunteers who are fully evaluated to make sure their blood is safe. This is blood bank blood. Transfusion therapy is the safest it has ever been in the practice of medicine. Before blood is taken from a donor, a complete history is taken to make sure that [place] has no history of diseases nor engages in risky social behavior (examples are intravenous drug use or sexual activity with multiple partners). The donor's travel history is screened to minimize risk of transmitting infections, such as malaria. The donated blood is tested for signs of infectious diseases, such as HIV and hepatitis. The blood is then tested to be sure it is compatible with you in order to minimize the chance of a transfusion reaction. If you or a relative donates blood, this is often done in anticipation of surgery and is not appropriate for emergency situations. It takes many days to process the donated blood. RISKS AND COMPLICATIONS Although transfusion therapy is very safe and saves many lives, the main dangers of transfusion include:   Getting an infectious disease.  Developing a transfusion reaction. This is an allergic reaction to something in the blood you were given. Every precaution is taken to prevent this. The decision to have a blood transfusion has been  considered carefully by your caregiver before blood is given. Blood is not given unless the benefits outweigh the risks. AFTER THE TRANSFUSION  Right after receiving a blood transfusion, you will usually feel much better and more energetic. This is especially true if your red blood cells have gotten low (anemic). The transfusion raises the level of the red blood cells which carry oxygen, and this usually causes an energy increase.  The nurse administering the transfusion will monitor you carefully for complications. HOME CARE  INSTRUCTIONS  No special instructions are needed after a transfusion. You may find your energy is better. Speak with your caregiver about any limitations on activity for underlying diseases you may have. SEEK MEDICAL CARE IF:   Your condition is not improving after your transfusion.  You develop redness or irritation at the intravenous (IV) site. SEEK IMMEDIATE MEDICAL CARE IF:  Any of the following symptoms occur over the next 12 hours:  Shaking chills.  You have a temperature by mouth above 102 F (38.9 C), not controlled by medicine.  Chest, back, or muscle pain.  People around you feel you are not acting correctly or are confused.  Shortness of breath or difficulty breathing.  Dizziness and fainting.  You get a rash or develop hives.  You have a decrease in urine output.  Your urine turns a dark color or changes to pink, red, or brown. Any of the following symptoms occur over the next 10 days:  You have a temperature by mouth above 102 F (38.9 C), not controlled by medicine.  Shortness of breath.  Weakness after normal activity.  The white part of the eye turns yellow (jaundice).  You have a decrease in the amount of urine or are urinating less often.  Your urine turns a dark color or changes to pink, red, or brown. Document Released: 02/28/2000 Document Revised: 05/25/2011 Document Reviewed: 10/17/2007 [facility] Patient Information 2014 [place], [place].  _______________________________________________________________________

## 2018-07-26 ENCOUNTER — Encounter (HOSPITAL_COMMUNITY)
Admission: RE | Admit: 2018-07-26 | Discharge: 2018-07-26 | Disposition: A | Discharge status: Home / Self Care | Payer: 59 | Source: Ambulatory Visit | Attending: Orthopaedic Surgery | Admitting: Orthopaedic Surgery

## 2018-07-26 ENCOUNTER — Encounter (HOSPITAL_COMMUNITY): Payer: Self-pay

## 2018-07-26 ENCOUNTER — Other Ambulatory Visit (HOSPITAL_COMMUNITY): Payer: 59

## 2018-07-26 ENCOUNTER — Other Ambulatory Visit (HOSPITAL_COMMUNITY)
Admission: RE | Admit: 2018-07-26 | Discharge: 2018-07-26 | Disposition: A | Discharge status: Home / Self Care | Payer: 59 | Source: Ambulatory Visit | Attending: Orthopaedic Surgery | Admitting: Orthopaedic Surgery

## 2018-07-26 ENCOUNTER — Ambulatory Visit (HOSPITAL_COMMUNITY)
Admission: RE | Admit: 2018-07-26 | Discharge: 2018-07-26 | Disposition: A | Discharge status: Home / Self Care | Payer: 59 | Source: Ambulatory Visit | Attending: Physician Assistant | Admitting: Physician Assistant

## 2018-07-26 ENCOUNTER — Other Ambulatory Visit: Payer: Self-pay

## 2018-07-26 DIAGNOSIS — M1711 Unilateral primary osteoarthritis, right knee: Secondary | ICD-10-CM | POA: Insufficient documentation

## 2018-07-26 DIAGNOSIS — Z1159 Encounter for screening for other viral diseases: Secondary | ICD-10-CM | POA: Diagnosis not present

## 2018-07-26 HISTORY — DX: Anemia, unspecified: D64.9

## 2018-07-26 HISTORY — DX: Other seasonal allergic rhinitis: J30.2

## 2018-07-26 HISTORY — DX: Personal history of other diseases of the digestive system: Z87.19

## 2018-07-26 HISTORY — DX: Prediabetes: R73.03

## 2018-07-26 HISTORY — DX: Osteoarthritis of knee, unspecified: M17.9

## 2018-07-26 HISTORY — DX: Depression, unspecified: F32.A

## 2018-07-26 HISTORY — DX: Localized edema: R60.0

## 2018-07-26 HISTORY — DX: Unilateral primary osteoarthritis, unspecified knee: M17.10

## 2018-07-26 LAB — COMPREHENSIVE METABOLIC PANEL
ALT: 25 U/L (ref 0–44)
AST: 23 U/L (ref 15–41)
Albumin: 4.3 g/dL (ref 3.5–5.0)
Alkaline Phosphatase: 82 U/L (ref 38–126)
Anion gap: 7 (ref 5–15)
BUN: 12 mg/dL (ref 6–20)
CO2: 29 mmol/L (ref 22–32)
Calcium: 9.3 mg/dL (ref 8.9–10.3)
Chloride: 106 mmol/L (ref 98–111)
Creatinine, Ser: 0.89 mg/dL (ref 0.44–1.00)
GFR calc Af Amer: >60 mL/min (ref >60)
GFR calc non Af Amer: >60 mL/min (ref >60)
Glucose, Bld: 103 mg/dL — ABNORMAL HIGH (ref 70–99)
Potassium: 3.8 mmol/L (ref 3.5–5.1)
Sodium: 142 mmol/L (ref 135–145)
Total Bilirubin: 0.8 mg/dL (ref 0.3–1.2)
Total Protein: 7.5 g/dL (ref 6.5–8.1)

## 2018-07-26 LAB — CBC WITH DIFFERENTIAL/PLATELET
Abs Immature Granulocytes: 0.01 10*3/uL (ref 0.00–0.07)
Basophils Absolute: 0 10*3/uL (ref 0.0–0.1)
Basophils Relative: 1 %
Eosinophils Absolute: 0.3 10*3/uL (ref 0.0–0.5)
Eosinophils Relative: 6 %
HCT: 41.2 % (ref 36.0–46.0)
Hemoglobin: 13.3 g/dL (ref 12.0–15.0)
Immature Granulocytes: 0 %
Lymphocytes Relative: 32 %
Lymphs Abs: 1.7 10*3/uL (ref 0.7–4.0)
MCH: 28.7 pg (ref 26.0–34.0)
MCHC: 32.3 g/dL (ref 30.0–36.0)
MCV: 88.8 fL (ref 80.0–100.0)
Monocytes Absolute: 0.4 10*3/uL (ref 0.1–1.0)
Monocytes Relative: 8 %
Neutro Abs: 3 10*3/uL (ref 1.7–7.7)
Neutrophils Relative %: 53 %
Platelets: 232 10*3/uL (ref 150–400)
RBC: 4.64 MIL/uL (ref 3.87–5.11)
RDW: 15.1 % (ref 11.5–15.5)
WBC: 5.5 10*3/uL (ref 4.0–10.5)
nRBC: 0 % (ref 0.0–0.2)

## 2018-07-26 LAB — PROTIME-INR
INR: 1 (ref 0.8–1.2)
Prothrombin Time: 13.4 seconds (ref 11.4–15.2)

## 2018-07-26 LAB — HEMOGLOBIN A1C
Hgb A1c MFr Bld: 6 % — ABNORMAL HIGH (ref 4.8–5.6)
Mean Plasma Glucose: 125.5 mg/dL

## 2018-07-26 LAB — SURGICAL PCR SCREEN
MRSA, PCR: NEGATIVE
Staphylococcus aureus: NEGATIVE

## 2018-07-26 LAB — ABO/RH: ABO/RH(D): O POS

## 2018-07-26 LAB — APTT: aPTT: 29 seconds (ref 24–36)

## 2018-07-27 LAB — NOVEL CORONAVIRUS, NAA (HOSP ORDER, SEND-OUT TO REF LAB; TAT 18-24 HRS): SARS-CoV-2, NAA: NOT DETECTED

## 2018-07-28 ENCOUNTER — Other Ambulatory Visit: Payer: Self-pay

## 2018-07-28 MED ORDER — TRANEXAMIC ACID 1000 MG/10ML IV SOLN
2000.0000 mg | INTRAVENOUS | Status: DC
  Filled 2018-07-28: qty 20

## 2018-07-29 ENCOUNTER — Encounter (HOSPITAL_COMMUNITY)
Admission: RE | Disposition: A | Discharge status: Home Health | Payer: Self-pay | Source: Home / Self Care | Attending: Orthopaedic Surgery

## 2018-07-29 ENCOUNTER — Other Ambulatory Visit: Payer: Self-pay

## 2018-07-29 ENCOUNTER — Inpatient Hospital Stay (HOSPITAL_COMMUNITY)
Admission: RE | Admit: 2018-07-29 | Discharge: 2018-07-30 | DRG: 470 | Disposition: A | Discharge status: Home Health | Payer: 59 | Attending: Orthopaedic Surgery | Admitting: Orthopaedic Surgery

## 2018-07-29 ENCOUNTER — Observation Stay (HOSPITAL_COMMUNITY): Payer: 59

## 2018-07-29 ENCOUNTER — Encounter (HOSPITAL_COMMUNITY): Payer: Self-pay | Admitting: Certified Registered Nurse Anesthetist

## 2018-07-29 ENCOUNTER — Inpatient Hospital Stay (HOSPITAL_COMMUNITY): Payer: 59 | Admitting: Physician Assistant

## 2018-07-29 ENCOUNTER — Inpatient Hospital Stay (HOSPITAL_COMMUNITY): Payer: 59 | Admitting: Certified Registered Nurse Anesthetist

## 2018-07-29 DIAGNOSIS — D571 Sickle-cell disease without crisis: Secondary | ICD-10-CM | POA: Diagnosis present

## 2018-07-29 DIAGNOSIS — Z885 Allergy status to narcotic agent status: Secondary | ICD-10-CM | POA: Diagnosis not present

## 2018-07-29 DIAGNOSIS — M1711 Unilateral primary osteoarthritis, right knee: Principal | ICD-10-CM | POA: Diagnosis present

## 2018-07-29 DIAGNOSIS — G8918 Other acute postprocedural pain: Secondary | ICD-10-CM | POA: Diagnosis not present

## 2018-07-29 DIAGNOSIS — Z886 Allergy status to analgesic agent status: Secondary | ICD-10-CM

## 2018-07-29 DIAGNOSIS — Z823 Family history of stroke: Secondary | ICD-10-CM

## 2018-07-29 DIAGNOSIS — Z841 Family history of disorders of kidney and ureter: Secondary | ICD-10-CM | POA: Diagnosis not present

## 2018-07-29 DIAGNOSIS — Z1159 Encounter for screening for other viral diseases: Secondary | ICD-10-CM | POA: Diagnosis not present

## 2018-07-29 DIAGNOSIS — M25761 Osteophyte, right knee: Secondary | ICD-10-CM | POA: Diagnosis present

## 2018-07-29 DIAGNOSIS — Z79899 Other long term (current) drug therapy: Secondary | ICD-10-CM | POA: Diagnosis not present

## 2018-07-29 DIAGNOSIS — Z91018 Allergy to other foods: Secondary | ICD-10-CM | POA: Diagnosis not present

## 2018-07-29 DIAGNOSIS — Z833 Family history of diabetes mellitus: Secondary | ICD-10-CM | POA: Diagnosis not present

## 2018-07-29 DIAGNOSIS — Z96651 Presence of right artificial knee joint: Secondary | ICD-10-CM

## 2018-07-29 DIAGNOSIS — J45909 Unspecified asthma, uncomplicated: Secondary | ICD-10-CM | POA: Diagnosis present

## 2018-07-29 DIAGNOSIS — Z96659 Presence of unspecified artificial knee joint: Secondary | ICD-10-CM

## 2018-07-29 HISTORY — PX: TOTAL KNEE ARTHROPLASTY: SHX125

## 2018-07-29 LAB — TYPE AND SCREEN
ABO/RH(D): O POS
Antibody Screen: NEGATIVE

## 2018-07-29 SURGERY — ARTHROPLASTY, KNEE, TOTAL
Anesthesia: General | Site: Knee | Laterality: Right

## 2018-07-29 MED ORDER — ONDANSETRON HCL 4 MG PO TABS: 4.0000 mg | ORAL_TABLET | Freq: Three times a day (TID) | ORAL | 0 refills | Status: DC | PRN

## 2018-07-29 MED ORDER — PHENOL 1.4 % MT LIQD: 1.0000 | OROMUCOSAL | Status: DC | PRN

## 2018-07-29 MED ORDER — BUPIVACAINE LIPOSOME 1.3 % IJ SUSP
20.0000 mL | Freq: Once | INTRAMUSCULAR | Status: AC
  Administered 2018-07-29: 13:00:00 20 mL
  Filled 2018-07-29: qty 20

## 2018-07-29 MED ORDER — MENTHOL 3 MG MT LOZG: 1.0000 | LOZENGE | OROMUCOSAL | Status: DC | PRN

## 2018-07-29 MED ORDER — MIDAZOLAM HCL 2 MG/2ML IJ SOLN
1.0000 mg | INTRAMUSCULAR | Status: DC
  Administered 2018-07-29: 1 mg via INTRAVENOUS

## 2018-07-29 MED ORDER — ALBUTEROL SULFATE (2.5 MG/3ML) 0.083% IN NEBU: 2.5000 mg | INHALATION_SOLUTION | Freq: Four times a day (QID) | RESPIRATORY_TRACT | Status: DC | PRN

## 2018-07-29 MED ORDER — ROPIVACAINE HCL 7.5 MG/ML IJ SOLN
INTRAMUSCULAR | Status: DC | PRN
  Administered 2018-07-29: 20 mL via PERINEURAL

## 2018-07-29 MED ORDER — CELECOXIB 200 MG PO CAPS
200.0000 mg | ORAL_CAPSULE | Freq: Two times a day (BID) | ORAL | Status: DC
  Administered 2018-07-29 – 2018-07-30 (×2): 200 mg via ORAL
  Filled 2018-07-29 (×2): qty 1

## 2018-07-29 MED ORDER — ACETAMINOPHEN 500 MG PO TABS
1000.0000 mg | ORAL_TABLET | Freq: Four times a day (QID) | ORAL | Status: AC
  Administered 2018-07-29 – 2018-07-30 (×4): 1000 mg via ORAL
  Filled 2018-07-29 (×4): qty 2

## 2018-07-29 MED ORDER — ONDANSETRON HCL 4 MG/2ML IJ SOLN: 4.0000 mg | Freq: Four times a day (QID) | INTRAMUSCULAR | Status: DC | PRN

## 2018-07-29 MED ORDER — OXYCODONE HCL 5 MG PO TABS: 5.0000 mg | ORAL_TABLET | Freq: Once | ORAL | Status: DC | PRN

## 2018-07-29 MED ORDER — ACETAMINOPHEN 325 MG PO TABS: 325.0000 mg | ORAL_TABLET | Freq: Four times a day (QID) | ORAL | Status: DC | PRN

## 2018-07-29 MED ORDER — FENTANYL CITRATE (PF) 100 MCG/2ML IJ SOLN
50.0000 ug | INTRAMUSCULAR | Status: DC
  Administered 2018-07-29: 50 ug via INTRAVENOUS

## 2018-07-29 MED ORDER — OXYCODONE HCL ER 10 MG PO T12A: 10.0000 mg | EXTENDED_RELEASE_TABLET | Freq: Two times a day (BID) | ORAL | 0 refills | Status: AC

## 2018-07-29 MED ORDER — SODIUM CHLORIDE (PF) 0.9 % IJ SOLN
INTRAMUSCULAR | Status: DC | PRN
  Administered 2018-07-29: 20 mL

## 2018-07-29 MED ORDER — VANCOMYCIN HCL 1000 MG IV SOLR
INTRAVENOUS | Status: DC | PRN
  Administered 2018-07-29: 1000 mg via TOPICAL

## 2018-07-29 MED ORDER — LIDOCAINE 2% (20 MG/ML) 5 ML SYRINGE
INTRAMUSCULAR | Status: DC | PRN
  Administered 2018-07-29: 80 mg via INTRAVENOUS

## 2018-07-29 MED ORDER — OXYCODONE HCL 5 MG/5ML PO SOLN: 5.0000 mg | Freq: Once | ORAL | Status: DC | PRN

## 2018-07-29 MED ORDER — SODIUM CHLORIDE (PF) 0.9 % IJ SOLN
INTRAMUSCULAR | Status: AC
  Filled 2018-07-29: qty 50

## 2018-07-29 MED ORDER — METOCLOPRAMIDE HCL 5 MG/ML IJ SOLN: 5.0000 mg | Freq: Three times a day (TID) | INTRAMUSCULAR | Status: DC | PRN

## 2018-07-29 MED ORDER — DEXAMETHASONE SODIUM PHOSPHATE 10 MG/ML IJ SOLN
10.0000 mg | Freq: Once | INTRAMUSCULAR | Status: AC
  Administered 2018-07-30: 10 mg via INTRAVENOUS
  Filled 2018-07-29: qty 1

## 2018-07-29 MED ORDER — METHOCARBAMOL 500 MG IVPB - SIMPLE MED
500.0000 mg | Freq: Four times a day (QID) | INTRAVENOUS | Status: DC | PRN
  Administered 2018-07-29: 15:00:00 500 mg via INTRAVENOUS
  Filled 2018-07-29: qty 50

## 2018-07-29 MED ORDER — FENTANYL CITRATE (PF) 100 MCG/2ML IJ SOLN
INTRAMUSCULAR | Status: AC
  Administered 2018-07-29: 50 ug via INTRAVENOUS
  Filled 2018-07-29: qty 2

## 2018-07-29 MED ORDER — PROMETHAZINE HCL 25 MG/ML IJ SOLN: 6.2500 mg | INTRAMUSCULAR | Status: DC | PRN

## 2018-07-29 MED ORDER — GABAPENTIN 300 MG PO CAPS
300.0000 mg | ORAL_CAPSULE | Freq: Three times a day (TID) | ORAL | Status: DC
  Administered 2018-07-29 – 2018-07-30 (×3): 300 mg via ORAL
  Filled 2018-07-29 (×3): qty 1

## 2018-07-29 MED ORDER — BUPIVACAINE-EPINEPHRINE (PF) 0.25% -1:200000 IJ SOLN
INTRAMUSCULAR | Status: AC
  Filled 2018-07-29: qty 30

## 2018-07-29 MED ORDER — BUPIVACAINE HCL (PF) 0.75 % IJ SOLN
INTRAMUSCULAR | Status: DC | PRN
  Administered 2018-07-29: 1.6 mL via INTRATHECAL

## 2018-07-29 MED ORDER — SODIUM CHLORIDE 0.9 % IR SOLN
Status: DC | PRN
  Administered 2018-07-29 (×2): 1000 mL

## 2018-07-29 MED ORDER — FENTANYL CITRATE (PF) 100 MCG/2ML IJ SOLN
INTRAMUSCULAR | Status: AC
  Filled 2018-07-29: qty 2

## 2018-07-29 MED ORDER — CEFAZOLIN SODIUM-DEXTROSE 2-4 GM/100ML-% IV SOLN
2.0000 g | Freq: Four times a day (QID) | INTRAVENOUS | Status: AC
  Administered 2018-07-29 – 2018-07-30 (×3): 2 g via INTRAVENOUS
  Filled 2018-07-29 (×3): qty 100

## 2018-07-29 MED ORDER — OXYCODONE HCL 5 MG PO TABS: 5.0000 mg | ORAL_TABLET | Freq: Three times a day (TID) | ORAL | 0 refills | Status: DC | PRN

## 2018-07-29 MED ORDER — METHOCARBAMOL 500 MG PO TABS
500.0000 mg | ORAL_TABLET | Freq: Four times a day (QID) | ORAL | Status: DC | PRN
  Administered 2018-07-30: 500 mg via ORAL
  Filled 2018-07-29: qty 1

## 2018-07-29 MED ORDER — POLYETHYLENE GLYCOL 3350 17 GM/SCOOP PO POWD: 17.0000 g | Freq: Two times a day (BID) | ORAL | Status: DC | PRN

## 2018-07-29 MED ORDER — METHOCARBAMOL 500 MG IVPB - SIMPLE MED
INTRAVENOUS | Status: AC
  Filled 2018-07-29: qty 50

## 2018-07-29 MED ORDER — SORBITOL 70 % SOLN
30.0000 mL | Freq: Every day | Status: DC | PRN
  Filled 2018-07-29: qty 30

## 2018-07-29 MED ORDER — TRANEXAMIC ACID-NACL 1000-0.7 MG/100ML-% IV SOLN
1000.0000 mg | INTRAVENOUS | Status: AC
  Administered 2018-07-29: 12:00:00 1000 mg via INTRAVENOUS
  Filled 2018-07-29: qty 100

## 2018-07-29 MED ORDER — DOCUSATE SODIUM 100 MG PO CAPS
100.0000 mg | ORAL_CAPSULE | Freq: Two times a day (BID) | ORAL | Status: DC
  Administered 2018-07-29 – 2018-07-30 (×2): 100 mg via ORAL
  Filled 2018-07-29 (×2): qty 1

## 2018-07-29 MED ORDER — LACTATED RINGERS IV SOLN
INTRAVENOUS | Status: DC
  Administered 2018-07-29 (×3): via INTRAVENOUS

## 2018-07-29 MED ORDER — ONDANSETRON HCL 4 MG PO TABS: 4.0000 mg | ORAL_TABLET | Freq: Four times a day (QID) | ORAL | Status: DC | PRN

## 2018-07-29 MED ORDER — CHLORHEXIDINE GLUCONATE 4 % EX LIQD: 60.0000 mL | Freq: Once | CUTANEOUS | Status: DC

## 2018-07-29 MED ORDER — MAGNESIUM CITRATE PO SOLN: 1.0000 | Freq: Once | ORAL | Status: DC | PRN

## 2018-07-29 MED ORDER — SENNOSIDES-DOCUSATE SODIUM 8.6-50 MG PO TABS: 1.0000 | ORAL_TABLET | Freq: Every evening | ORAL | 1 refills | Status: DC | PRN

## 2018-07-29 MED ORDER — METOCLOPRAMIDE HCL 5 MG PO TABS: 5.0000 mg | ORAL_TABLET | Freq: Three times a day (TID) | ORAL | Status: DC | PRN

## 2018-07-29 MED ORDER — HYDROMORPHONE HCL 1 MG/ML IJ SOLN: 0.5000 mg | INTRAMUSCULAR | Status: DC | PRN

## 2018-07-29 MED ORDER — RIVAROXABAN 10 MG PO TABS
10.0000 mg | ORAL_TABLET | Freq: Every day | ORAL | Status: DC
  Administered 2018-07-30: 10 mg via ORAL
  Filled 2018-07-29: qty 1

## 2018-07-29 MED ORDER — SODIUM CHLORIDE 0.9 % IV SOLN
INTRAVENOUS | Status: DC
  Administered 2018-07-29 – 2018-07-30 (×2): via INTRAVENOUS

## 2018-07-29 MED ORDER — BUPIVACAINE-EPINEPHRINE (PF) 0.25% -1:200000 IJ SOLN
INTRAMUSCULAR | Status: DC | PRN
  Administered 2018-07-29: 20 mL

## 2018-07-29 MED ORDER — PROPOFOL 10 MG/ML IV BOLUS
INTRAVENOUS | Status: AC
  Filled 2018-07-29: qty 60

## 2018-07-29 MED ORDER — KETOROLAC TROMETHAMINE 15 MG/ML IJ SOLN
30.0000 mg | Freq: Four times a day (QID) | INTRAMUSCULAR | Status: AC
  Administered 2018-07-29 – 2018-07-30 (×4): 30 mg via INTRAVENOUS
  Filled 2018-07-29 (×5): qty 2

## 2018-07-29 MED ORDER — TRANEXAMIC ACID-NACL 1000-0.7 MG/100ML-% IV SOLN
1000.0000 mg | Freq: Once | INTRAVENOUS | Status: AC
  Administered 2018-07-29: 1000 mg via INTRAVENOUS
  Filled 2018-07-29: qty 100

## 2018-07-29 MED ORDER — CEFAZOLIN SODIUM-DEXTROSE 2-4 GM/100ML-% IV SOLN
2.0000 g | INTRAVENOUS | Status: AC
  Administered 2018-07-29: 12:00:00 2 g via INTRAVENOUS
  Filled 2018-07-29: qty 100

## 2018-07-29 MED ORDER — MIDAZOLAM HCL 2 MG/2ML IJ SOLN
INTRAMUSCULAR | Status: AC
  Administered 2018-07-29: 1 mg via INTRAVENOUS
  Filled 2018-07-29: qty 2

## 2018-07-29 MED ORDER — TRANEXAMIC ACID 1000 MG/10ML IV SOLN
INTRAVENOUS | Status: DC | PRN
  Administered 2018-07-29: 2000 mg via TOPICAL

## 2018-07-29 MED ORDER — OXYCODONE HCL 5 MG PO TABS
5.0000 mg | ORAL_TABLET | ORAL | Status: DC | PRN
  Administered 2018-07-29 (×2): 5 mg via ORAL
  Filled 2018-07-29: qty 2
  Filled 2018-07-29 (×2): qty 1

## 2018-07-29 MED ORDER — OXYCODONE HCL 5 MG PO TABS
10.0000 mg | ORAL_TABLET | ORAL | Status: DC | PRN
  Administered 2018-07-30: 10 mg via ORAL

## 2018-07-29 MED ORDER — DIPHENHYDRAMINE HCL 12.5 MG/5ML PO ELIX: 25.0000 mg | ORAL_SOLUTION | ORAL | Status: DC | PRN

## 2018-07-29 MED ORDER — FENTANYL CITRATE (PF) 100 MCG/2ML IJ SOLN
25.0000 ug | INTRAMUSCULAR | Status: DC | PRN
  Administered 2018-07-29 (×3): 50 ug via INTRAVENOUS

## 2018-07-29 MED ORDER — PROPOFOL 500 MG/50ML IV EMUL
INTRAVENOUS | Status: DC | PRN
  Administered 2018-07-29: 160 mg via INTRAVENOUS

## 2018-07-29 MED ORDER — LORATADINE 10 MG PO TABS: 10.0000 mg | ORAL_TABLET | Freq: Every day | ORAL | Status: DC | PRN

## 2018-07-29 MED ORDER — FENTANYL CITRATE (PF) 100 MCG/2ML IJ SOLN
INTRAMUSCULAR | Status: DC | PRN
  Administered 2018-07-29 (×4): 50 ug via INTRAVENOUS

## 2018-07-29 MED ORDER — ONDANSETRON HCL 4 MG/2ML IJ SOLN
INTRAMUSCULAR | Status: DC | PRN
  Administered 2018-07-29: 4 mg via INTRAVENOUS

## 2018-07-29 MED ORDER — RIVAROXABAN 10 MG PO TABS: 10.0000 mg | ORAL_TABLET | Freq: Every day | ORAL | 0 refills | Status: DC

## 2018-07-29 MED ORDER — VANCOMYCIN HCL 1000 MG IV SOLR
INTRAVENOUS | Status: AC
  Filled 2018-07-29: qty 1000

## 2018-07-29 MED ORDER — OXYCODONE HCL ER 10 MG PO T12A
10.0000 mg | EXTENDED_RELEASE_TABLET | Freq: Two times a day (BID) | ORAL | Status: DC
  Administered 2018-07-29 – 2018-07-30 (×2): 10 mg via ORAL
  Filled 2018-07-29 (×3): qty 1

## 2018-07-29 MED ORDER — POVIDONE-IODINE 10 % EX SWAB
2.0000 "application " | Freq: Once | CUTANEOUS | Status: AC
  Administered 2018-07-29: 2 via TOPICAL

## 2018-07-29 MED ORDER — POLYETHYLENE GLYCOL 3350 17 G PO PACK: 17.0000 g | PACK | Freq: Every day | ORAL | Status: DC | PRN

## 2018-07-29 MED ORDER — METHOCARBAMOL 750 MG PO TABS: 750.0000 mg | ORAL_TABLET | Freq: Two times a day (BID) | ORAL | 0 refills | Status: DC | PRN

## 2018-07-29 MED ORDER — DEXAMETHASONE SODIUM PHOSPHATE 10 MG/ML IJ SOLN
INTRAMUSCULAR | Status: DC | PRN
  Administered 2018-07-29: 5 mg via INTRAVENOUS

## 2018-07-29 MED ORDER — ALUM & MAG HYDROXIDE-SIMETH 200-200-20 MG/5ML PO SUSP: 30.0000 mL | ORAL | Status: DC | PRN

## 2018-07-29 MED ORDER — FENTANYL CITRATE (PF) 100 MCG/2ML IJ SOLN
INTRAMUSCULAR | Status: AC
  Filled 2018-07-29: qty 4

## 2018-07-29 MED ORDER — PROMETHAZINE HCL 25 MG PO TABS: 25.0000 mg | ORAL_TABLET | Freq: Four times a day (QID) | ORAL | 1 refills | Status: DC | PRN

## 2018-07-29 MED ORDER — ISOPROPYL ALCOHOL 70 % SOLN
Status: AC
  Filled 2018-07-29: qty 480

## 2018-07-29 SURGICAL SUPPLY — 91 items
ADH SKN CLS APL DERMABOND .7 (GAUZE/BANDAGES/DRESSINGS) ×1
APL PRP STRL LF DISP 70% ISPRP (MISCELLANEOUS) ×3
APL SKNCLS STERI-STRIP NONHPOA (GAUZE/BANDAGES/DRESSINGS) ×1
BAG SPEC THK2 15X12 ZIP CLS (MISCELLANEOUS) ×1
BAG ZIPLOCK 12X15 (MISCELLANEOUS) ×2 IMPLANT
BANDAGE ACE 6X5 VEL STRL LF (GAUZE/BANDAGES/DRESSINGS) ×4 IMPLANT
BANDAGE ESMARK 6X9 LF (GAUZE/BANDAGES/DRESSINGS) ×1 IMPLANT
BEARING TIBIAL INST SZ4 10 KNE (Miscellaneous) IMPLANT
BENZOIN TINCTURE PRP APPL 2/3 (GAUZE/BANDAGES/DRESSINGS) ×1 IMPLANT
BLADE HEX COATED 2.75 (ELECTRODE) ×1 IMPLANT
BLADE SAW SGTL 13.0X1.19X90.0M (BLADE) ×2 IMPLANT
BLADE SURG SZ10 CARB STEEL (BLADE) ×4 IMPLANT
BNDG CMPR 9X6 STRL LF SNTH (GAUZE/BANDAGES/DRESSINGS) ×1
BNDG CMPR MED 15X6 ELC VLCR LF (GAUZE/BANDAGES/DRESSINGS) ×1
BNDG ELASTIC 6X15 VLCR STRL LF (GAUZE/BANDAGES/DRESSINGS) ×1 IMPLANT
BNDG ESMARK 6X9 LF (GAUZE/BANDAGES/DRESSINGS) ×2
BOWL SMART MIX CTS (DISPOSABLE) ×2 IMPLANT
BSPLAT TIB 4 KN TRITANIUM (Knees) ×1 IMPLANT
CHLORAPREP W/TINT 26 (MISCELLANEOUS) ×6 IMPLANT
COVER SURGICAL LIGHT HANDLE (MISCELLANEOUS) ×2 IMPLANT
COVER WAND RF STERILE (DRAPES) IMPLANT
CUFF TOURN SGL QUICK 34 (TOURNIQUET CUFF) ×2
CUFF TRNQT CYL 34X4.125X (TOURNIQUET CUFF) ×1 IMPLANT
DECANTER SPIKE VIAL GLASS SM (MISCELLANEOUS) ×2 IMPLANT
DERMABOND ADVANCED (GAUZE/BANDAGES/DRESSINGS) ×1
DERMABOND ADVANCED .7 DNX12 (GAUZE/BANDAGES/DRESSINGS) ×1 IMPLANT
DRAPE ORTHO SPLIT 77X108 STRL (DRAPES) ×6
DRAPE POUCH INSTRU U-SHP 10X18 (DRAPES) ×2 IMPLANT
DRAPE SHEET LG 3/4 BI-LAMINATE (DRAPES) ×2 IMPLANT
DRAPE SURG 17X11 SM STRL (DRAPES) ×4 IMPLANT
DRAPE SURG ORHT 6 SPLT 77X108 (DRAPES) ×3 IMPLANT
DRSG AQUACEL AG ADV 3.5X14 (GAUZE/BANDAGES/DRESSINGS) ×2 IMPLANT
DRSG PAD ABDOMINAL 8X10 ST (GAUZE/BANDAGES/DRESSINGS) ×4 IMPLANT
DRSG TEGADERM 4X4.75 (GAUZE/BANDAGES/DRESSINGS) ×2 IMPLANT
ELECT REM PT RETURN 15FT ADLT (MISCELLANEOUS) ×2 IMPLANT
EVACUATOR 1/8 PVC DRAIN (DRAIN) IMPLANT
FACESHIELD WRAPAROUND (MASK) ×10 IMPLANT
FACESHIELD WRAPAROUND OR TEAM (MASK) ×5 IMPLANT
FEMORAL POSTERIOR SZ4 RT (Femur) IMPLANT
GAUZE SPONGE 2X2 8PLY STRL LF (GAUZE/BANDAGES/DRESSINGS) ×1 IMPLANT
GAUZE SPONGE 4X4 12PLY STRL (GAUZE/BANDAGES/DRESSINGS) IMPLANT
GAUZE XEROFORM 5X9 LF (GAUZE/BANDAGES/DRESSINGS) IMPLANT
GLOVE ORTHO TXT STRL SZ7.5 (GLOVE) ×4 IMPLANT
GLOVE SKINSENSE NS SZ7.5 (GLOVE) ×1
GLOVE SKINSENSE STRL SZ7.5 (GLOVE) IMPLANT
GLOVE SURG SS PI 7.5 STRL IVOR (GLOVE) ×2 IMPLANT
GLOVE SURG SYN 7.5  E (GLOVE) ×1
GLOVE SURG SYN 7.5 E (GLOVE) ×1 IMPLANT
GLOVE SURG SYN 7.5 PF PI (GLOVE) IMPLANT
GOWN STRL REUS W/TWL XL LVL3 (GOWN DISPOSABLE) ×4 IMPLANT
HANDPIECE INTERPULSE COAX TIP (DISPOSABLE) ×2
HOLDER FOLEY CATH W/STRAP (MISCELLANEOUS) ×2 IMPLANT
KIT BASIN OR (CUSTOM PROCEDURE TRAY) ×2 IMPLANT
KIT TURNOVER KIT A (KITS) IMPLANT
KNEE PATELLA ASYMMETRIC 10X32 (Knees) ×1 IMPLANT
KNEE TIBIAL COMP TRI SZ4 (Knees) ×1 IMPLANT
MARKER SKIN DUAL TIP RULER LAB (MISCELLANEOUS) ×2 IMPLANT
NDL SAFETY ECLIPSE 18X1.5 (NEEDLE) IMPLANT
NDL SPNL 18GX3.5 QUINCKE PK (NEEDLE) ×1 IMPLANT
NEEDLE HYPO 18GX1.5 SHARP (NEEDLE)
NEEDLE SPNL 18GX3.5 QUINCKE PK (NEEDLE) ×2 IMPLANT
NS IRRIG 1000ML POUR BTL (IV SOLUTION) ×4 IMPLANT
PADDING CAST COTTON 6X4 STRL (CAST SUPPLIES) ×4 IMPLANT
PIN FLUTED HEDLESS FIX 3.5X1/8 (Miscellaneous) ×1 IMPLANT
POSTERIOR FEMORAL SZ4 RT (Femur) ×2 IMPLANT
PROTECTOR NERVE ULNAR (MISCELLANEOUS) ×2 IMPLANT
SAW OSC TIP CART 19.5X105X1.3 (SAW) ×2 IMPLANT
SET HNDPC FAN SPRY TIP SCT (DISPOSABLE) ×1 IMPLANT
SET PAD KNEE POSITIONER (MISCELLANEOUS) ×2 IMPLANT
SOL PREP PROV IODINE SCRUB 4OZ (MISCELLANEOUS) ×2 IMPLANT
SPONGE GAUZE 2X2 STER 10/PKG (GAUZE/BANDAGES/DRESSINGS) ×1
STRIP CLOSURE SKIN 1/2X4 (GAUZE/BANDAGES/DRESSINGS) ×4 IMPLANT
STRIP CLOSURE SKIN 1/4X4 (GAUZE/BANDAGES/DRESSINGS) ×4 IMPLANT
SUCTION FRAZIER HANDLE 12FR (TUBING) ×1
SUCTION TUBE FRAZIER 12FR DISP (TUBING) ×1 IMPLANT
SUT MNCRL AB 3-0 PS2 18 (SUTURE) ×2 IMPLANT
SUT VIC AB 0 CT1 36 (SUTURE) ×1 IMPLANT
SUT VIC AB 1 CT1 36 (SUTURE) ×5 IMPLANT
SUT VIC AB 2-0 CT1 27 (SUTURE) ×6
SUT VIC AB 2-0 CT1 TAPERPNT 27 (SUTURE) ×3 IMPLANT
SUT VLOC 180 0 24IN GS25 (SUTURE) ×2 IMPLANT
SYR 3ML LL SCALE MARK (SYRINGE) IMPLANT
SYR 50ML LL SCALE MARK (SYRINGE) ×3 IMPLANT
TIBIAL BEAR INST SZ4 10 KNEE (Miscellaneous) ×2 IMPLANT
TIBIAL BEARING INSERT-PS (Knees) ×1 IMPLANT
TOWEL OR 17X26 10 PK STRL BLUE (TOWEL DISPOSABLE) ×2 IMPLANT
TOWEL OR NON WOVEN STRL DISP B (DISPOSABLE) ×2 IMPLANT
TRAY FOLEY BAG SILVER LF 14FR (CATHETERS) ×1 IMPLANT
TRAY FOLEY MTR SLVR 16FR STAT (SET/KITS/TRAYS/PACK) ×1 IMPLANT
WATER STERILE IRR 1000ML POUR (IV SOLUTION) ×4 IMPLANT
WRAP KNEE MAXI GEL POST OP (GAUZE/BANDAGES/DRESSINGS) ×2 IMPLANT

## 2018-07-29 NOTE — Progress Notes (Signed)
Assisted Dr. Brock with right, ultrasound guided, adductor canal block. Side rails up, monitors on throughout procedure. See vital signs in flow sheet. Tolerated Procedure well.  

## 2018-07-29 NOTE — Anesthesia Procedure Notes (Signed)
Anesthesia Regional Block: Adductor canal block   Pre-Anesthetic Checklist: ,, timeout performed, Correct Patient, Correct Site, Correct Laterality, Correct Procedure, Correct Position, site marked, Risks and benefits discussed,  Surgical consent,  Pre-op evaluation,  At surgeon's request and post-op pain management  Laterality: Right  Prep: chloraprep       Needles:  Injection technique: Single-shot  Needle Type: Echogenic Needle     Needle Length: 10cm  Needle Gauge: 21     Additional Needles:   Narrative:  Start time: 07/29/2018 11:10 AM End time: 07/29/2018 11:13 AM Injection made incrementally with aspirations every 5 mL.  Performed by: Personally  Anesthesiologist: Audry Pili, MD  Additional Notes: No pain on injection. No increased resistance to injection. Injection made in 5cc increments. Good needle visualization. Patient tolerated the procedure well.

## 2018-07-29 NOTE — Plan of Care (Signed)

## 2018-07-29 NOTE — Anesthesia Postprocedure Evaluation (Signed)
Anesthesia Post Note  Patient: Anna Berry  Procedure(s) Performed: RIGHT TOTAL KNEE ARTHROPLASTY (Right Knee)     Patient location during evaluation: PACU Anesthesia Type: General Level of consciousness: oriented and awake and alert Pain management: pain level controlled Vital Signs Assessment: post-procedure vital signs reviewed and stable Respiratory status: spontaneous breathing, respiratory function stable and nonlabored ventilation Cardiovascular status: blood pressure returned to baseline and stable Postop Assessment: no headache, no backache, no apparent nausea or vomiting and spinal receding Anesthetic complications: no    Last Vitals:  Vitals:   07/29/18 1415 07/29/18 1430  BP: 124/85 (!) 139/95  Pulse: 90 85  Resp: 16 11  Temp:    SpO2: 100% 100%    Last Pain:  Vitals:   07/29/18 1515  TempSrc:   PainSc: 4                  Lidia Collum

## 2018-07-29 NOTE — Op Note (Signed)
Total Knee Arthroplasty Procedure Note  Preoperative diagnosis: Right knee osteoarthritis  Postoperative diagnosis:same  Operative procedure: Right total knee arthroplasty. CPT [phone]  Surgeon: N. Eduard Roux, MD  Assist: Madalyn Rob, PA-C; necessary for the timely completion of procedure and due to complexity of procedure.  Anesthesia: Spinal, regional, LMA  Tourniquet time: 60 mins  Implants used: [employer] Triatholon Pressfit Femur: PS 4 Tibia: 4 Patella: 32 mm Polyethylene: 10 mm  Indication: Anna Berry is a 58 y.o. year old female with a history of knee pain. Having failed conservative management, the patient elected to proceed with a total knee arthroplasty.  We have reviewed the risk and benefits of the surgery and they elected to proceed after voicing understanding.  Procedure:  After informed consent was obtained and understanding of the risk were voiced including but not limited to bleeding, infection, damage to surrounding structures including nerves and vessels, blood clots, leg length inequality and the failure to achieve desired results, the operative extremity was marked with verbal confirmation of the patient in the holding area.   The patient was then brought to the operating room and transported to the operating room table in the supine position.  A tourniquet was applied to the operative extremity around the upper thigh. The operative limb was then prepped and draped in the usual sterile fashion and preoperative antibiotics were administered.  A time out was performed prior to the start of surgery confirming the correct extremity, preoperative antibiotic administration, as well as team members, implants and instruments available for the case. Correct surgical site was also confirmed with preoperative radiographs. The limb was then elevated for exsanguination and the tourniquet was inflated. A midline incision was made and a standard medial parapatellar  approach was performed.  The patella was prepared and sized to a 32 mm.  A cover was placed on the patella for protection from retractors.  We then turned our attention to the femur. Posterior cruciate ligament was sacrificed. Start site was drilled in the femur and the intramedullary distal femoral cutting guide was placed, set at 5 degrees valgus, taking 11 mm of distal resection. The distal cut was made. Osteophytes were then removed. Next, the proximal tibial cutting guide was placed with appropriate slope, varus/valgus alignment and depth of resection. The proximal tibial cut was made. Gap blocks were then used to assess the extension gap and alignment, and appropriate soft tissue releases were performed. Attention was turned back to the femur, which was sized using the sizing guide to a size 4. Appropriate rotation of the femoral component was determined using epicondylar axis, Whiteside's line, and assessing the flexion gap under ligament tension. The appropriate size 4-in-1 cutting block was placed and cuts were made. Posterior femoral osteophytes and uncapped bone were then removed with the curved osteotome. The tibia was sized for a size 4 component. The femoral box-cutting guide was placed and prepared for a PS femoral component. Trial components were placed, and stability was checked in full extension, mid-flexion, and deep flexion. Proper tibial rotation was determined and marked.  The patella tracked well without a lateral release. Trial components were then removed and tibial preparation performed. A posterior capsular injection comprising of 20 cc of 1.3% exparel, 20 cc 0.25% bupivicaine with epinephrine and 20 cc of normal saline was performed for postoperative pain control. The bony surfaces were irrigated with a pulse lavage and then dried. The final components sized above were malleted into place. The stability of the construct was  re-evaluated throughout a range of motion and found to be  acceptable. The trial liner was removed, the knee was copiously irrigated, and the knee was re-evaluated for any excess bone debris. The real polyethylene liner, 10 mm thick, was inserted and checked to ensure the locking mechanism had engaged appropriately. The tourniquet was deflated and hemostasis was achieved. The wound was irrigated with normal saline.  One gram of vancomycin powder was placed in the surgical bed.  A drain was not placed.  Capsular closure was performed with a #1 vicryl, subcutaneous fat closed with a 0 vicryl suture, then subcutaneous tissue closed with interrupted 2.0 vicryl suture. The skin was then closed with a 3.0 monocryl. A sterile dressing was applied.  The patient was awakened in the operating room and taken to recovery in stable condition. All sponge, needle, and instrument counts were correct at the end of the case.  Position: supine  Complications: none.  Time Out: performed   Drains/Packing: none  Estimated blood loss: minimal  Returned to Recovery Room: in good condition.   Antibiotics: yes   Mechanical VTE (DVT) Prophylaxis: sequential compression devices, TED thigh-high  Chemical VTE (DVT) Prophylaxis: xarelto  Fluid Replacement  Crystalloid: see anesthesia record Blood: none  FFP: none   Specimens Removed: 1 to pathology   Sponge and Instrument Count Correct? yes   PACU: portable radiograph - knee AP and Lateral   Plan/RTC: Return in 2 weeks for wound check.   Weight Bearing/Load Lower Extremity: full   N. Eduard Roux, MD [place] 1:25 PM

## 2018-07-29 NOTE — Anesthesia Procedure Notes (Signed)
Procedure Name: LMA Insertion °Performed by: Thoren Hosang J, CRNA °Pre-anesthesia Checklist: Patient identified, Emergency Drugs available, Suction available, Patient being monitored and Timeout performed °Patient Re-evaluated:Patient Re-evaluated prior to induction °Oxygen Delivery Method: Circle system utilized °Preoxygenation: Pre-oxygenation with 100% oxygen °Induction Type: IV induction °LMA: LMA inserted °LMA Size: 4.0 °Number of attempts: 1 °Placement Confirmation: positive ETCO2 and breath sounds checked- equal and bilateral °Tube secured with: Tape °Dental Injury: Teeth and Oropharynx as per pre-operative assessment  ° ° ° ° ° ° °

## 2018-07-29 NOTE — Transfer of Care (Signed)
Immediate Anesthesia Transfer of Care Note  Patient: Anna Berry  Procedure(s) Performed: RIGHT TOTAL KNEE ARTHROPLASTY (Right Knee)  Patient Location: PACU  Anesthesia Type:General  Level of Consciousness: sedated, patient cooperative and responds to stimulation  Airway & Oxygen Therapy: Patient Spontanous Breathing and Patient connected to face mask oxygen  Post-op Assessment: Report given to RN and Post -op Vital signs reviewed and stable  Post vital signs: Reviewed and stable  Last Vitals:  Vitals Value Taken Time  BP    Temp    Pulse    Resp    SpO2      Last Pain:  Vitals:   07/29/18 1028  TempSrc:   PainSc: 7       Patients Stated Pain Goal: 3 (33/[id number])  Complications: No apparent anesthesia complications

## 2018-07-29 NOTE — H&P (Signed)
PREOPERATIVE H&P  Chief Complaint: right knee degenerative joint disease  HPI: Anna Berry is a 58 y.o. female who presents for surgical treatment of right knee degenerative joint disease.  She denies any changes in medical history.  Past Medical History:  Diagnosis Date  . Allergy   . Anemia   . Arthritis   . Asthma    allergies induced  . Bilateral lower extremity edema   . Depression   . Fibroids 08/2002  . Gross hematuria 05/2010  . History of appendicitis   . History of colon polyps 2017  . Multiple renal cysts 05/24/2008   Two left renal parapelvic cysts.  . OA (osteoarthritis) of knee    Right  . Obesity   . Plantar fasciitis   . Postmenopausal   . Pre-diabetes   . 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 Seasonal allergies   . Sickle cell anemia ([facility])    sickle cell trait  . Sickle cell trait ([place])   . Sigmoid diverticulosis 09/26/2015   Noted on colonoscopy   Past Surgical History:  Procedure Laterality Date  . ABDOMINAL HYSTERECTOMY  2004  . APPENDECTOMY  2006  . [place]   x2  . COLONOSCOPY W/ POLYPECTOMY  09/26/2015   Social History   Socioeconomic History  . Marital status: Married    Spouse name: Not on file  . Number of children: Not on file  . Years of education: Not on file  . Highest education level: Not on file  Occupational History  . Not on file  Social Needs  . Financial resource strain: Not on file  . Food insecurity:    Worry: Not on file    Inability: Not on file  . Transportation needs:    Medical: Not on file    Non-medical: Not on file  Tobacco Use  . Smoking status: Never Smoker  . Smokeless tobacco: Never Used  Substance and Sexual Activity  . Alcohol use: No    Alcohol/week: 0.0 standard drinks  . Drug use: No  . Sexual activity: Yes    Birth control/protection: Surgical  Lifestyle  . Physical activity:    Days per week: Not on file    Minutes per session: Not on file  . Stress: Not on  file  Relationships  . Social connections:    Talks on phone: Not on file    Gets together: Not on file    Attends religious service: Not on file    Active member of club or organization: Not on file    Attends meetings of clubs or organizations: Not on file    Relationship status: Not on file  Other Topics Concern  . Not on file  Social History Narrative  . Not on file   Family History  Problem Relation Age of Onset  . Stomach cancer Maternal Grandmother 78  . Stroke Maternal Grandmother   . Diabetes Father   . Kidney disease Father   . Colon cancer Neg Hx    Allergies  Allergen Reactions  . Acetaminophen     Renal papillary necrosis in high doses  . Morphine And Related Nausea And Vomiting  . Nsaids     Renal papillary necrosis  . Other Itching and Swelling    Tree nuts   Prior to Admission medications   Medication Sig Start Date End Date Taking? Authorizing Provider  acetaminophen (TYLENOL) 500 MG tablet Take 1,000 mg by mouth daily as needed  for moderate pain.   Yes [provider]  albuterol (PROVENTIL HFA;VENTOLIN HFA) 108 (90 Base) MCG/ACT inhaler Inhale 2 puffs into the lungs every 6 (six) hours as needed for wheezing or shortness of breath. 06/30/18  Yes Lockamy, Timothy, DO  loratadine (CLARITIN) 10 MG tablet Take 10 mg by mouth daily as needed for allergies.    Yes [provider]  polyethylene glycol powder (GLYCOLAX/MIRALAX) powder Take 17 g by mouth 2 (two) times daily as needed. Patient taking differently: Take 17 g by mouth 2 (two) times daily as needed for mild constipation.  03/01/14  Yes Street, Sharon Mt, MD  Tetrahydrozoline HCl (VISINE OP) Place 1 drop into both eyes daily as needed (allergies).   Yes [provider]  cyclobenzaprine (FLEXERIL) 5 MG tablet Take 1 tablet (5 mg total) by mouth at bedtime as needed. Patient not taking: Reported on 07/21/2018 06/18/17   Verner Mould, MD  Diclofenac Sodium 2 % SOLN 2  pumps BID Patient not taking: Reported on 07/21/2018 01/19/18   Loa Socks, DO  Diclofenac Sodium 3 % CREA Apply 1 application topically 2 (two) times daily as needed. Patient not taking: Reported on 07/21/2018 01/14/18   Nuala Alpha, DO  traMADol (ULTRAM) 50 MG tablet Take 1 tablet (50 mg total) by mouth every 8 (eight) hours as needed. Patient not taking: Reported on 01/19/2018 06/18/17   Verner Mould, MD     Positive ROS: All other systems have been reviewed and were otherwise negative with the exception of those mentioned in the HPI and as above.  Physical Exam: General: Alert, no acute distress Cardiovascular: No pedal edema Respiratory: No cyanosis, no use of accessory musculature GI: abdomen soft Skin: No lesions in the area of chief complaint Neurologic: Sensation intact distally Psychiatric: Patient is competent for consent with normal mood and affect Lymphatic: no lymphedema  MUSCULOSKELETAL: exam stable  Assessment: right knee degenerative joint disease  Plan: Plan for Procedure(s): RIGHT TOTAL KNEE ARTHROPLASTY  The risks benefits and alternatives were discussed with the patient including but not limited to the risks of nonoperative treatment, versus surgical intervention including infection, bleeding, nerve injury,  blood clots, cardiopulmonary complications, morbidity, mortality, among others, and they were willing to proceed.   Preoperative templating of the joint replacement has been completed, documented, and submitted to the Operating Room personnel in order to optimize intra-operative equipment management.  Patient's anticipated LOS is less than 2 midnights, meeting these requirements: - Younger than 98 - Lives within 1 hour of care - Has a competent adult at home to recover with post-op recover - NO history of  - Chronic pain requiring opiods  - Diabetes  - Coronary Artery Disease  - Heart failure  - Heart attack  - Stroke  - DVT/VTE  -  Cardiac arrhythmia  - Respiratory Failure/COPD  - Renal failure  - Anemia  - Advanced Liver disease  Eduard Roux, MD   07/29/2018 8:34 AM

## 2018-07-29 NOTE — Anesthesia Preprocedure Evaluation (Addendum)
Anesthesia Evaluation  Patient identified by MRN, date of birth, ID band Patient awake    Reviewed: Allergy & Precautions, NPO status , Patient's Chart, lab work & pertinent test results  History of Anesthesia Complications Negative for: history of anesthetic complications  Airway Mallampati: II  TM Distance: >3 FB Neck ROM: Full    Dental  (+) Dental Advisory Given, Teeth Intact   Pulmonary asthma ,    breath sounds clear to auscultation       Cardiovascular negative cardio ROS   Rhythm:Regular Rate:Normal     Neuro/Psych PSYCHIATRIC DISORDERS Depression negative neurological ROS     GI/Hepatic negative GI ROS, Neg liver ROS,   Endo/Other   Pre-DM   Renal/GU  Renal papillary necrosis      Musculoskeletal  (+) Arthritis , Osteoarthritis,    Abdominal   Peds  Hematology  (+) Blood dyscrasia, Sickle cell trait ,   Anesthesia Other Findings   Reproductive/Obstetrics                            Anesthesia Physical Anesthesia Plan  ASA: II  Anesthesia Plan: Spinal   Post-op Pain Management:  Regional for Post-op pain   Induction:   PONV Risk Score and Plan: 2 and Treatment may vary due to age or medical condition and Propofol infusion  Airway Management Planned: Natural Airway and Simple Face Mask  Additional Equipment: None  Intra-op Plan:   Post-operative Plan:   Informed Consent: I have reviewed the patients History and Physical, chart, labs and discussed the procedure including the risks, benefits and alternatives for the proposed anesthesia with the patient or authorized representative who has indicated his/her understanding and acceptance.       Plan Discussed with: CRNA and Anesthesiologist  Anesthesia Plan Comments: (Labs reviewed, platelets acceptable. Discussed risks and benefits of spinal, including spinal/epidural hematoma, infection, failed block, and PDPH.  Patient expressed understanding and wished to proceed. )       Anesthesia Quick Evaluation

## 2018-07-29 NOTE — Anesthesia Procedure Notes (Signed)
Spinal  Start time: 07/29/2018 11:33 AM End time: 07/29/2018 11:38 AM Staffing Resident/CRNA: Gean Maidens, CRNA Performed: resident/CRNA  Preanesthetic Checklist Completed: patient identified, site marked, surgical consent, pre-op evaluation, timeout performed, IV checked, risks and benefits discussed and monitors and equipment checked Spinal Block Patient position: sitting Prep: DuraPrep Patient monitoring: heart rate, continuous pulse ox and blood pressure Approach: midline Location: L3-4 Injection technique: single-shot Needle Needle type: Pencan  Needle gauge: 24 G Needle length: 9 cm Needle insertion depth: 7 cm Additional Notes Pt sitting position sterile prep and drape, negative paresthesia/heme

## 2018-07-29 NOTE — Evaluation (Signed)
Physical Therapy Evaluation Patient Details Name: Anna Berry MRN: [id number] DOB: 04/30/1960 Today's Date: 07/29/2018   History of Present Illness  58 yo female s/p R TKR on 07/29/18. PMH includes OA, bilateral LE edema, depression, sickle cell trait, sigmoid diverticulosis.  Clinical Impression   Pt presents with R knee pain, decreased R knee ROM, RLE post-surgical weakness, difficulty performing mobility tasks, and decreased activity tolerance secondary to R knee pain and feelings of dizzness. Pt to benefit from acute PT to address deficits. Pt dangled EOB with pt ~5 minutes and stood with min assist for 1 minute, requiring return to supine due to R knee weakness post-surgery, dizziness/feeling hot/nausea. BP and HR WNL, see mobility section. Pt educated on ankle pumps (20/hour) to perform this afternoon/evening to increase circulation, to pt's tolerance and limited by pain. PT to progress mobility as tolerated, and will continue to follow acutely.         Follow surgeon's recommendation for DC plan and follow-up therapies;Supervision for mobility/OOB(HHPT)    Equipment Recommendations  Rolling walker with 5" wheels;3in1 (PT)    Recommendations for Other Services       Precautions / Restrictions Precautions Precautions: Fall Restrictions Weight Bearing Restrictions: No Other Position/Activity Restrictions: WBAT      Mobility  Bed Mobility Overal bed mobility: Needs Assistance Bed Mobility: Supine to Sit;Sit to Supine     Supine to sit: Min assist;HOB elevated Sit to supine: Min assist;HOB elevated   General bed mobility comments: Min assist for supine<>sit for RLE lifting and translation to and from EOB, trunk management. Pt able to scoot AP and laterally without assist but with increased time.   Transfers Overall transfer level: Needs assistance Equipment used: Rolling walker (2 wheeled) Transfers: Sit to/from Stand Sit to Stand: Min assist;From elevated  surface         General transfer comment: Min assist for power up, guarding RLE upon standing. Verbal cuing for hand placement. Pt stood ~1 minute, RLE instability noted and required assist to facilitate knee extension. Pt also began feeling dizzy and hot, returned to supine. BP and HR 151/91, 84 bpm. Reduced symptoms upon return to supine.   Ambulation/Gait Ambulation/Gait assistance: (NT - pt with R knee weakness post surgery and was dizzy/hot)              Stairs            Wheelchair Mobility    Modified Rankin (Stroke Patients Only)       Balance Overall balance assessment: Mild deficits observed, not formally tested                                           Pertinent Vitals/Pain Pain Assessment: 0-10 Pain Score: 7  Pain Location: R knee Pain Descriptors / Indicators: Sore Pain Intervention(s): Monitored during session;Repositioned;Limited activity within patient's tolerance;Premedicated before session    Home Living Family/patient expects to be discharged to:: Private residence Living Arrangements: Spouse/significant other;Children Available Help at Discharge: Family;Available PRN/intermittently Type of Home: House Home Access: Stairs to enter Entrance Stairs-Rails: None Entrance Stairs-Number of Steps: 2 Home Layout: One level Home Equipment: Cane - single point      Prior Function Level of Independence: Independent               Hand Dominance   Dominant Hand: Right    Extremity/Trunk Assessment   Upper Extremity  Assessment Upper Extremity Assessment: Overall WFL for tasks assessed    Lower Extremity Assessment Lower Extremity Assessment: Overall WFL for tasks assessed;RLE deficits/detail LLE Deficits / Details: suspected post-surgical weakness; able to perform ankle pumps, heel slide to 45*, quad set, difficulty performing SLR without significant quad lag and lift assist LLE Sensation: WNL    Cervical / Trunk  Assessment Cervical / Trunk Assessment: Normal  Communication   Communication: No difficulties  Cognition Arousal/Alertness: Awake/alert Behavior During Therapy: WFL for tasks assessed/performed Overall Cognitive Status: Within Functional Limits for tasks assessed                                        General Comments      Exercises     Assessment/Plan    PT Assessment Patient needs continued PT services  PT Problem List Decreased strength;Decreased mobility;Decreased range of motion;Decreased balance;Decreased activity tolerance;Decreased knowledge of use of DME;Pain       PT Treatment Interventions DME instruction;Functional mobility training;Balance training;Patient/family education;Gait training;Therapeutic activities;Stair training;Therapeutic exercise    PT Goals (Current goals can be found in the Care Plan section)  Acute Rehab PT Goals Patient Stated Goal: go home to husband and daughter PT Goal Formulation: With patient Time For Goal Achievement: 08/05/18 Potential to Achieve Goals: Good    Frequency 7X/week   Barriers to discharge        Co-evaluation               AM-PAC PT "6 Clicks" Mobility  Outcome Measure Help needed turning from your back to your side while in a flat bed without using bedrails?: A Little Help needed moving from lying on your back to sitting on the side of a flat bed without using bedrails?: A Little Help needed moving to and from a bed to a chair (including a wheelchair)?: A Little Help needed standing up from a chair using your arms (e.g., wheelchair or bedside chair)?: A Little Help needed to walk in hospital room?: A Lot Help needed climbing 3-5 steps with a railing? : A Lot 6 Click Score: 16    End of Session Equipment Utilized During Treatment: Gait belt Activity Tolerance: Patient limited by fatigue;Patient limited by pain;Treatment limited secondary to medical complications (Comment) Patient left: in  bed;with bed alarm set;with SCD's reapplied;with call bell/phone within reach Nurse Communication: Mobility status PT Visit Diagnosis: Other abnormalities of gait and mobility (R26.89);Difficulty in walking, not elsewhere classified (R26.2)    Time: 6803-2122 PT Time Calculation (min) (ACUTE ONLY): 25 min   Charges:   PT Evaluation $PT Eval Low Complexity: 1 Low PT Treatments $Therapeutic Activity: 8-22 mins        Destine Zirkle Conception Chancy, PT Acute Rehabilitation Services Pager [phone]  Office [phone]   Najae Rathert D Yuma Pacella 07/29/2018, 7:00 PM

## 2018-07-29 NOTE — Discharge Instructions (Signed)
INSTRUCTIONS AFTER JOINT REPLACEMENT   o Remove items at home which could result in a fall. This includes throw rugs or furniture in walking pathways o ICE to the affected joint every three hours while awake for 30 minutes at a time, for at least the first 3-5 days, and then as needed for pain and swelling.  Continue to use ice for pain and swelling. You may notice swelling that will progress down to the foot and ankle.  This is normal after surgery.  Elevate your leg when you are not up walking on it.   o Continue to use the breathing machine you got in the hospital (incentive spirometer) which will help keep your temperature down.  It is common for your temperature to cycle up and down following surgery, especially at night when you are not up moving around and exerting yourself.  The breathing machine keeps your lungs expanded and your temperature down.   DIET:  As you were doing prior to hospitalization, we recommend a well-balanced diet.  DRESSING / WOUND CARE / SHOWERING  You may change your surgical dressing 7 days after surgery.  Then change the dressing every day with sterile gauze.  Please use good hand washing techniques before changing the dressing.  Do not use any lotions or creams on the incision until instructed by your surgeon.  You may shower while you have the surgical dressing which is waterproof.  After removal of surgical dressing, you must cover the incision when showering.  ACTIVITY  o Increase activity slowly as tolerated, but follow the weight bearing instructions below.   o No driving for 6 weeks or until further direction given by your physician.  You cannot drive while taking narcotics.  o No lifting or carrying greater than 10 lbs. until further directed by your surgeon. o Avoid periods of inactivity such as sitting longer than an hour when not asleep. This helps prevent blood clots.  o You may return to work once you are authorized by your doctor.     WEIGHT  BEARING   Weight bearing as tolerated with assist device (walker, cane, etc) as directed, use it as long as suggested by your surgeon or therapist, typically at least 4-6 weeks.   EXERCISES  Results after joint replacement surgery are often greatly improved when you follow the exercise, range of motion and muscle strengthening exercises prescribed by your doctor. Safety measures are also important to protect the joint from further injury. Any time any of these exercises cause you to have increased pain or swelling, decrease what you are doing until you are comfortable again and then slowly increase them. If you have problems or questions, call your caregiver or physical therapist for advice.   Rehabilitation is important following a joint replacement. After just a few days of immobilization, the muscles of the leg can become weakened and shrink (atrophy).  These exercises are designed to build up the tone and strength of the thigh and leg muscles and to improve motion. Often times heat used for twenty to thirty minutes before working out will loosen up your tissues and help with improving the range of motion but do not use heat for the first two weeks following surgery (sometimes heat can increase post-operative swelling).   These exercises can be done on a training (exercise) mat, on the floor, on a table or on a bed. Use whatever works the best and is most comfortable for you.    Use music or television while  you are exercising so that the exercises are a pleasant break in your day. This will make your life better with the exercises acting as a break in your routine that you can look forward to.   Perform all exercises about fifteen times, three times per day or as directed.  You should exercise both the operative leg and the other leg as well. ° °Exercises include: °  °• Quad Sets - Tighten up the muscle on the front of the thigh (Quad) and hold for 5-10 seconds.   °• Straight Leg Raises - With your  knee straight (if you were given a brace, keep it on), lift the leg to 60 degrees, hold for 3 seconds, and slowly lower the leg.  Perform this exercise against resistance later as your leg gets stronger.  °• Leg Slides: Lying on your back, slowly slide your foot toward your buttocks, bending your knee up off the floor (only go as far as is comfortable). Then slowly slide your foot back down until your leg is flat on the floor again.  °• Angel Wings: Lying on your back spread your legs to the side as far apart as you can without causing discomfort.  °• Hamstring Strength:  Lying on your back, push your heel against the floor with your leg straight by tightening up the muscles of your buttocks.  Repeat, but this time bend your knee to a comfortable angle, and push your heel against the floor.  You may put a pillow under the heel to make it more comfortable if necessary.  ° °A rehabilitation program following joint replacement surgery can speed recovery and prevent re-injury in the future due to weakened muscles. Contact your doctor or a physical therapist for more information on knee rehabilitation.  ° ° °CONSTIPATION ° °Constipation is defined medically as fewer than three stools per week and severe constipation as less than one stool per week.  Even if you have a regular bowel pattern at home, your normal regimen is likely to be disrupted due to multiple reasons following surgery.  Combination of anesthesia, postoperative narcotics, change in appetite and fluid intake all can affect your bowels.  ° °YOU MUST use at least one of the following options; they are listed in order of increasing strength to get the job done.  They are all available over the counter, and you may need to use some, POSSIBLY even all of these options:   ° °Drink plenty of fluids (prune juice may be helpful) and high fiber foods °Colace 100 mg by mouth twice a day  °Senokot for constipation as directed and as needed Dulcolax (bisacodyl), take  with full glass of water  °Miralax (polyethylene glycol) once or twice a day as needed. ° °If you have tried all these things and are unable to have a bowel movement in the first 3-4 days after surgery call either your surgeon or your primary doctor.   ° °If you experience loose stools or diarrhea, hold the medications until you stool forms back up.  If your symptoms do not get better within 1 week or if they get worse, check with your doctor.  If you experience "the worst abdominal pain ever" or develop nausea or vomiting, please contact the office immediately for further recommendations for treatment. ° ° °ITCHING:  If you experience itching with your medications, try taking only a single pain pill, or even half a pain pill at a time.  You can also use Benadryl over the counter   for itching or also to help with sleep.   TED HOSE STOCKINGS:  Use stockings on both legs until for at least 2 weeks or as directed by physician office. They may be removed at night for sleeping.  MEDICATIONS:  See your medication summary on the After Visit Summary that nursing will review with you.  You may have some home medications which will be placed on hold until you complete the course of blood thinner medication.  It is important for you to complete the blood thinner medication as prescribed.  PRECAUTIONS:  If you experience chest pain or shortness of breath - call 911 immediately for transfer to the hospital emergency department.   If you develop a fever greater that 101 F, purulent drainage from wound, increased redness or drainage from wound, foul odor from the wound/dressing, or calf pain - CONTACT YOUR SURGEON.                                                   FOLLOW-UP APPOINTMENTS:  If you do not already have a post-op appointment, please call the office for an appointment to be seen by your surgeon.  Guidelines for how soon to be seen are listed in your After Visit Summary, but are typically between 1-4 weeks  after surgery.  OTHER INSTRUCTIONS:   Knee Replacement:  Do not place pillow under knee, focus on keeping the knee straight while resting. CPM instructions: 0-90 degrees, 2 hours in the morning, 2 hours in the afternoon, and 2 hours in the evening. Place foam block, curve side up under heel at all times except when in CPM or when walking.  DO NOT modify, tear, cut, or change the foam block in any way.  MAKE SURE YOU:   Understand these instructions.   Get help right away if you are not doing well or get worse.    Thank you for letting us be a part of your medical care team.  It is a privilege we respect greatly.  We hope these instructions will help you stay on track for a fast and full recovery!    Information on my medicine - XARELTO (Rivaroxaban)  This medication education was reviewed with me or my healthcare representative as part of my discharge preparation.  The pharmacist that spoke with me during my hospital stay was:    Why was Xarelto prescribed for you? Xarelto was prescribed for you to reduce the risk of blood clots forming after orthopedic surgery. The medical term for these abnormal blood clots is venous thromboembolism (VTE).  What do you need to know about xarelto ? Take your Xarelto ONCE DAILY at the same time every day. You may take it either with or without food.  If you have difficulty swallowing the tablet whole, you may crush it and mix in applesauce just prior to taking your dose.  Take Xarelto exactly as prescribed by your doctor and DO NOT stop taking Xarelto without talking to the doctor who prescribed the medication.  Stopping without other VTE prevention medication to take the place of Xarelto may increase your risk of developing a clot.  After discharge, you should have regular check-up appointments with your healthcare provider that is prescribing your Xarelto.    What do you do if you miss a dose? If you miss a dose, take it as  soon as you  remember on the same day then continue your regularly scheduled once daily regimen the next day. Do not take two doses of Xarelto on the same day.   Important Safety Information A possible side effect of Xarelto is bleeding. You should call your healthcare provider right away if you experience any of the following: ? Bleeding from an injury or your nose that does not stop. ? Unusual colored urine (red or dark brown) or unusual colored stools (red or black). ? Unusual bruising for unknown reasons. ? A serious fall or if you hit your head (even if there is no bleeding).  Some medicines may interact with Xarelto and might increase your risk of bleeding while on Xarelto. To help avoid this, consult your healthcare provider or pharmacist prior to using any new prescription or non-prescription medications, including herbals, vitamins, non-steroidal anti-inflammatory drugs (NSAIDs) and supplements.  This website has more information on Xarelto: [email or web address]

## 2018-07-30 LAB — CBC
HCT: 36.8 % (ref 36.0–46.0)
Hemoglobin: 11.5 g/dL — ABNORMAL LOW (ref 12.0–15.0)
MCH: 27.8 pg (ref 26.0–34.0)
MCHC: 31.3 g/dL (ref 30.0–36.0)
MCV: 88.9 fL (ref 80.0–100.0)
Platelets: 218 10*3/uL (ref 150–400)
RBC: 4.14 MIL/uL (ref 3.87–5.11)
RDW: 14.8 % (ref 11.5–15.5)
WBC: 11.1 10*3/uL — ABNORMAL HIGH (ref 4.0–10.5)
nRBC: 0 % (ref 0.0–0.2)

## 2018-07-30 LAB — BASIC METABOLIC PANEL
Anion gap: 7 (ref 5–15)
BUN: 10 mg/dL (ref 6–20)
CO2: 26 mmol/L (ref 22–32)
Calcium: 8.5 mg/dL — ABNORMAL LOW (ref 8.9–10.3)
Chloride: 108 mmol/L (ref 98–111)
Creatinine, Ser: 0.91 mg/dL (ref 0.44–1.00)
GFR calc Af Amer: >60 mL/min (ref >60)
GFR calc non Af Amer: >60 mL/min (ref >60)
Glucose, Bld: 128 mg/dL — ABNORMAL HIGH (ref 70–99)
Potassium: 4 mmol/L (ref 3.5–5.1)
Sodium: 141 mmol/L (ref 135–145)

## 2018-07-30 NOTE — Plan of Care (Signed)
Patient discharged home in stable condition 

## 2018-07-30 NOTE — Progress Notes (Signed)
     Home health agencies that serve [id number]. Your favorite home health agencies  Home Health Agencies Search Results  Results List Table Home Health Agency Information Quality of Patient Care Rating Patient Survey Summary Rating  [facility]  [phone] 4 out of 5 stars 4 out of 5 stars  [facility]  [phone] 3 out of 5 stars 4 out of 5 stars  [facility]  [phone] 4  out of 5 stars 3 out of 5 stars  [facility]  [phone] 4  out of 5 stars 4 out of 5 stars  [facility]  [phone] 4 out of 5 stars 4 out of 5 stars  [facility]  [phone] 4 out of 5 stars 4 out of 5 stars  [facility]  [phone] 3  out of 5 stars 4 out of 5 stars  [facility]  [phone] 3 out of 5 stars 4 out of 5 stars  [facility]  [phone] 4 out of 5 stars Not Available  [facility]  [phone] 3 out of 5 stars 4 out of 5 stars  [facility]  [phone] 3  out of 5 stars 3 out of 5 stars  [facility]  [phone] 5 out of 5 stars 4 out of 5 stars  [facility]  [phone] 3  out of 5 stars 4 out of 5 stars  [facility]  [phone] 3  out of 5 stars 3 out of 5 stars  [facility]  [phone] 3  out of 5 stars Not Available  [facility]  [phone] 4  out of 5 stars 3 out of 5 stars   Home Health Footnotes Footnote number Footnote as displayed on Home Health Compare  1 This agency provides services under a federal waiver program to non-traditional, chronic long term population.  2 This agency provides services to a special needs population.  3 Not Available.  4 The number of patient episodes for this measure is too small to report.  5 This measure currently does not have data or provider has been certified/recertified for  less than 6 months.  6 The national average for this measure is not provided because of state-to-state differences in data collection.  7 Medicare is not displaying rates for this measure for any home health agency, because of an issue with the data.  8 There were problems with the data and they are being corrected.  9 Zero, or very few, patients met the survey's rules for inclusion. The scores shown, if any, reflect a very small number of surveys and may not accurately tell how an agency is doing.  10 Survey results are based on less than 12 months of data.  11 Fewer than 70 patients completed the survey. Use the scores shown, if any, with caution as the number of surveys may be too low to accurately tell how an agency is doing.  12 No survey results are available for this period.  13 Data suppressed by CMS for one or more quarters.    

## 2018-07-30 NOTE — Progress Notes (Signed)
Subjective: 1 Day Post-Op Procedure(s) (LRB): RIGHT TOTAL KNEE ARTHROPLASTY (Right) Patient reports pain as moderate.  Motivated to go home today this afternoon.  Objective: Vital signs in last 24 hours: Temp:  [97.4 F (36.3 C)-98.5 F (36.9 C)] 98.2 F (36.8 C) (05/16 0442) Pulse Rate:  [65-97] 97 (05/16 0442) Resp:  [11-19] 16 (05/16 0442) BP: (118-148)/(71-98) 140/90 (05/16 0442) SpO2:  [96 %-100 %] 98 % (05/16 0442) Weight:  [43.1 kg] 43.1 kg (05/15 1028)  Intake/Output from previous day: 05/15 0701 - 05/16 0700 In: 4865.1 [P.O.:300; I.V.:4255.1; IV Piggyback:300] Out: 2831 [Urine:4050; Blood:25] Intake/Output this shift: No intake/output data recorded.  Recent Labs    07/30/18 0356  HGB 11.5*   Recent Labs    07/30/18 0356  WBC 11.1*  RBC 4.14  HCT 36.8  PLT 218   Recent Labs    07/30/18 0356  NA 141  K 4.0  CL 108  CO2 26  BUN 10  CREATININE 0.91  GLUCOSE 128*  CALCIUM 8.5*   No results for input(s): LABPT, INR in the last 72 hours.  Sensation intact distally Intact pulses distally Dorsiflexion/Plantar flexion intact Incision: dressing C/D/I Compartment soft   Assessment/Plan: 1 Day Post-Op Procedure(s) (LRB): RIGHT TOTAL KNEE ARTHROPLASTY (Right) Up with therapy Discharge home with home health this afternoon.      Mcarthur Rossetti 07/30/2018, 9:42 AM

## 2018-07-30 NOTE — Progress Notes (Signed)
Physical Therapy Treatment Patient Details Name: Anna Berry MRN: [id number] DOB: 12-27-1960 Today's Date: 07/30/2018    History of Present Illness 58 yo female s/p R TKR on 07/29/18. PMH includes OA, bilateral LE edema, depression, sickle cell trait, sigmoid diverticulosis.    PT Comments    Pt very cooperative and progressed to ambulate short distance in hall with no c/o dizziness.   Follow Up Recommendations  Follow surgeon's recommendation for DC plan and follow-up therapies;Supervision for mobility/OOB     Equipment Recommendations  Rolling walker with 5" wheels;3in1 (PT)    Recommendations for Other Services       Precautions / Restrictions Precautions Precautions: Fall Restrictions Weight Bearing Restrictions: No Other Position/Activity Restrictions: WBAT    Mobility  Bed Mobility Overal bed mobility: Needs Assistance Bed Mobility: Supine to Sit     Supine to sit: Min assist;HOB elevated     General bed mobility comments: min assist for LE management  Transfers Overall transfer level: Needs assistance Equipment used: Rolling walker (2 wheeled) Transfers: Sit to/from Stand Sit to Stand: Min assist;From elevated surface         General transfer comment: cues for LE management and use of UEs to self assist  Ambulation/Gait Ambulation/Gait assistance: Min assist;Min guard Gait Distance (Feet): 65 Feet Assistive device: Rolling walker (2 wheeled) Gait Pattern/deviations: Step-to pattern;Decreased step length - right;Decreased step length - left;Shuffle;Trunk flexed Gait velocity: decre   General Gait Details: cues for sequence, posture, and position from [employer] Mobility    Modified Rankin (Stroke Patients Only)       Balance Overall balance assessment: Mild deficits observed, not formally tested                                          Cognition Arousal/Alertness:  Awake/alert Behavior During Therapy: WFL for tasks assessed/performed Overall Cognitive Status: Within Functional Limits for tasks assessed                                        Exercises Total Joint Exercises Ankle Circles/Pumps: AROM;15 reps;Supine;Both Quad Sets: AROM;Both;10 reps;Supine Heel Slides: AAROM;Right;15 reps;Supine Straight Leg Raises: AAROM;Right;10 reps;Supine Goniometric ROM: AAROM R knee -5 - 45    General Comments        Pertinent Vitals/Pain Pain Assessment: 0-10 Pain Score: 6  Pain Location: R knee Pain Descriptors / Indicators: Sore Pain Intervention(s): Limited activity within patient's tolerance;Monitored during session;Premedicated before session;Ice applied    Home Living                      Prior Function            PT Goals (current goals can now be found in the care plan section) Acute Rehab PT Goals Patient Stated Goal: go home to husband and daughter PT Goal Formulation: With patient Time For Goal Achievement: 08/05/18 Potential to Achieve Goals: Good Progress towards PT goals: Progressing toward goals    Frequency    7X/week      PT Plan Current plan remains appropriate    Co-evaluation              AM-PAC PT "6 Clicks" Mobility   Outcome  Measure  Help needed turning from your back to your side while in a flat bed without using bedrails?: A Little Help needed moving from lying on your back to sitting on the side of a flat bed without using bedrails?: A Little Help needed moving to and from a bed to a chair (including a wheelchair)?: A Little Help needed standing up from a chair using your arms (e.g., wheelchair or bedside chair)?: A Little Help needed to walk in hospital room?: A Little Help needed climbing 3-5 steps with a railing? : A Lot 6 Click Score: 17    End of Session Equipment Utilized During Treatment: Gait belt Activity Tolerance: Patient tolerated treatment well;Patient  limited by pain;Patient limited by fatigue Patient left: in chair;with call bell/phone within reach;with chair alarm set Nurse Communication: Mobility status PT Visit Diagnosis: Other abnormalities of gait and mobility (R26.89);Difficulty in walking, not elsewhere classified (R26.2)     Time: 5997-7414 PT Time Calculation (min) (ACUTE ONLY): 38 min  Charges:  $Gait Training: 8-22 mins $Therapeutic Exercise: 8-22 mins $Therapeutic Activity: 8-22 mins                     Anna Berry PT Acute Rehabilitation Services Pager [phone] Office [phone]    Anna Berry 07/30/2018, 12:53 PM

## 2018-07-30 NOTE — Progress Notes (Signed)
Physical Therapy Treatment Patient Details Name: Anna Berry MRN: [id number] DOB: [birth year] Today's Date: 07/30/2018    History of Present Illness 58 yo female s/p R TKR on 07/29/18. PMH includes OA, bilateral LE edema, depression, sickle cell trait, sigmoid diverticulosis.    PT Comments    Pt feeling much better and with marked improvement in activity tolerance.  Pt reviewed home therex and stairs with written instruction provided.   Follow Up Recommendations  Follow surgeon's recommendation for DC plan and follow-up therapies;Supervision for mobility/OOB     Equipment Recommendations  Rolling walker with 5" wheels;3in1 (PT)    Recommendations for Other Services       Precautions / Restrictions Precautions Precautions: Fall;Knee Restrictions Weight Bearing Restrictions: No Other Position/Activity Restrictions: WBAT    Mobility  Bed Mobility Overal bed mobility: Needs Assistance Bed Mobility: Supine to Sit;Sit to Supine     Supine to sit: Supervision Sit to supine: Min guard   General bed mobility comments: cues for sequence  Transfers Overall transfer level: Needs assistance Equipment used: Rolling walker (2 wheeled) Transfers: Sit to/from Stand Sit to Stand: Min guard;Supervision         General transfer comment: cues for LE management and use of UEs to self assist  Ambulation/Gait Ambulation/Gait assistance: Min guard;Supervision Gait Distance (Feet): 150 Feet Assistive device: Rolling walker (2 wheeled) Gait Pattern/deviations: Step-to pattern;Decreased step length - right;Decreased step length - left;Shuffle;Trunk flexed Gait velocity: decre   General Gait Details: cues for sequence, posture, and position from RW   Stairs Stairs: Yes Stairs assistance: Min assist Stair Management: No rails;Step to pattern;Backwards;With walker Number of Stairs: 4 General stair comments: 2 steps twice with RW bkwd and cues for sequence and foot/RW  placement.  Written instruction provided   Wheelchair Mobility    Modified Rankin (Stroke Patients Only)       Balance Overall balance assessment: Mild deficits observed, not formally tested                                          Cognition Arousal/Alertness: Awake/alert Behavior During Therapy: WFL for tasks assessed/performed Overall Cognitive Status: Within Functional Limits for tasks assessed                                        Exercises Total Joint Exercises Ankle Circles/Pumps: AROM;15 reps;Supine;Both Quad Sets: AROM;Both;10 reps;Supine Heel Slides: AAROM;Right;15 reps;Supine Straight Leg Raises: AAROM;Right;10 reps;Supine Goniometric ROM: AAROM R knee -5 - 45    General Comments        Pertinent Vitals/Pain Pain Assessment: 0-10 Pain Score: 5  Pain Location: R knee Pain Descriptors / Indicators: Sore Pain Intervention(s): Limited activity within patient's tolerance;Monitored during session;Premedicated before session;Ice applied    Home Living                      Prior Function            PT Goals (current goals can now be found in the care plan section) Acute Rehab PT Goals Patient Stated Goal: go home to husband and daughter PT Goal Formulation: With patient Time For Goal Achievement: 08/05/18 Potential to Achieve Goals: Good Progress towards PT goals: Progressing toward goals    Frequency    7X/week  PT Plan Current plan remains appropriate    Co-evaluation              AM-PAC PT "6 Clicks" Mobility   Outcome Measure  Help needed turning from your back to your side while in a flat bed without using bedrails?: None Help needed moving from lying on your back to sitting on the side of a flat bed without using bedrails?: A Little Help needed moving to and from a bed to a chair (including a wheelchair)?: A Little Help needed standing up from a chair using your arms (e.g., wheelchair  or bedside chair)?: A Little Help needed to walk in hospital room?: A Little Help needed climbing 3-5 steps with a railing? : A Little 6 Click Score: 19    End of Session Equipment Utilized During Treatment: Gait belt Activity Tolerance: Patient tolerated treatment well;Patient limited by pain;Patient limited by fatigue Patient left: with call bell/phone within reach;in bed Nurse Communication: Mobility status PT Visit Diagnosis: Other abnormalities of gait and mobility (R26.89);Difficulty in walking, not elsewhere classified (R26.2)     Time: 5997-7414 PT Time Calculation (min) (ACUTE ONLY): 22 min  Charges:  $Gait Training: 8-22 mins $Therapeutic Exercise: 8-22 mins $Therapeutic Activity: 8-22 mins                     Debe Coder PT Acute Rehabilitation Services Pager [phone] Office [phone]    Hyacinth Marcelli 07/30/2018, 3:54 PM

## 2018-07-30 NOTE — Discharge Summary (Signed)
Patient ID: Anna Berry MRN: [id number] DOB/AGE: Dec 28, 1960 58 y.o.  Admit date: 07/29/2018 Discharge date: 07/30/2018  Admission Diagnoses:  Principal Problem:   Osteoarthritis of right knee Active Problems:   Total knee replacement status   Discharge Diagnoses:  Same  Past Medical History:  Diagnosis Date  . Allergy   . Anemia   . Arthritis   . Asthma    allergies induced  . Bilateral lower extremity edema   . Depression   . Fibroids 08/2002  . Gross hematuria 05/2010  . History of appendicitis   . History of colon polyps 2017  . Multiple renal cysts 05/24/2008   Two left renal parapelvic cysts.  . OA (osteoarthritis) of knee    Right  . Obesity   . Plantar fasciitis   . Postmenopausal   . Pre-diabetes   . 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 Seasonal allergies   . Sickle cell anemia (HCC)    sickle cell trait  . Sickle cell trait ([place])   . Sigmoid diverticulosis 09/26/2015   Noted on colonoscopy    Surgeries: Procedure(s): RIGHT TOTAL KNEE ARTHROPLASTY on 07/29/2018   Consultants:   Discharged Condition: Improved  Hospital Course: Anna Berry is an 58 y.o. female who was admitted 07/29/2018 for operative treatment ofOsteoarthritis of right knee. Patient has severe unremitting pain that affects sleep, daily activities, and work/hobbies. After pre-op clearance the patient was taken to the operating room on 07/29/2018 and underwent  Procedure(s): RIGHT TOTAL KNEE ARTHROPLASTY.    Patient was given perioperative antibiotics:  Anti-infectives (From admission, onward)   Start     Dose/Rate Route Frequency Ordered Stop   07/29/18 1800  ceFAZolin (ANCEF) IVPB 2g/100 mL premix     2 g 200 mL/hr over 30 Minutes Intravenous Every 6 hours 07/29/18 1615 07/30/18 0653   07/29/18 1315  vancomycin (VANCOCIN) powder  Status:  Discontinued       As needed 07/29/18 1328 07/29/18 1404   07/29/18 1015  ceFAZolin (ANCEF) IVPB 2g/100 mL premix     2 g 200 mL/hr over  30 Minutes Intravenous On call to O.R. 07/29/18 1002 07/29/18 1233       Patient was given sequential compression devices, early ambulation, and chemoprophylaxis to prevent DVT.  Patient benefited maximally from hospital stay and there were no complications.    Recent vital signs:  Patient Vitals for the past 24 hrs:  BP Temp Temp src Pulse Resp SpO2 Height Weight  07/30/18 0442 140/90 98.2 F (36.8 C) Oral 97 16 98 % - -  07/30/18 0152 135/88 98.1 F (36.7 C) Oral 95 16 96 % - -  07/29/18 2141 (!) 144/89 98.4 F (36.9 C) Oral 93 14 98 % - -  07/29/18 1921 (!) 142/81 98.2 F (36.8 C) Oral 80 16 99 % - -  07/29/18 1822 (!) 145/94 (!) 97.5 F (36.4 C) Oral 85 17 100 % - -  07/29/18 1733 (!) 146/85 (!) 97.4 F (36.3 C) Oral 76 17 100 % - -  07/29/18 1611 (!) 135/92 97.6 F (36.4 C) Oral 89 18 98 % - -  07/29/18 1553 136/71 - - - - - - -  07/29/18 1545 (!) 145/86 (!) 97.4 F (36.3 C) - 80 13 100 % - -  07/29/18 1530 135/77 - - 66 12 100 % - -  07/29/18 1515 (!) 142/87 - - 77 12 100 % - -  07/29/18 1500 (!) 143/90 - - 65 12 100 % - -  07/29/18 1445 (!) 137/94 - - 77 14 100 % - -  07/29/18 1430 (!) 139/95 - - 85 11 100 % - -  07/29/18 1415 124/85 - - 90 16 100 % - -  07/29/18 1408 (!) 137/98 97.7 F (36.5 C) - - 12 100 % - -  07/29/18 1115 118/85 - - 86 13 100 % - -  07/29/18 1114 - - - 83 13 100 % - -  07/29/18 1113 - - - 87 13 100 % - -  07/29/18 1112 - - - 92 13 100 % - -  07/29/18 1111 - - - 87 13 100 % - -  07/29/18 1110 (!) 148/93 - - 91 13 100 % - -  07/29/18 1109 - - - 87 16 100 % - -  07/29/18 1108 - - - 92 19 100 % - -  07/29/18 1107 - - - - 15 - - -  07/29/18 1106 (!) 141/95 - - - 16 98 % - -  07/29/18 1028 - - - - - - 5' 6.5" (1.689 m) 43.1 kg  07/29/18 1009 (!) 138/92 98.5 F (36.9 C) Oral 95 16 96 % - -     Recent laboratory studies:  Recent Labs    07/30/18 0356  WBC 11.1*  HGB 11.5*  HCT 36.8  PLT 218  NA 141  K 4.0  CL 108  CO2 26  BUN 10   CREATININE 0.91  GLUCOSE 128*  CALCIUM 8.5*     Discharge Medications:   Allergies as of 07/30/2018      Reactions   Acetaminophen    Renal papillary necrosis in high doses   Morphine And Related Nausea And Vomiting   Nsaids    Renal papillary necrosis   Other Itching, Swelling   Tree nuts      Medication List    TAKE these medications   acetaminophen 500 MG tablet Commonly known as:  TYLENOL Take 1,000 mg by mouth daily as needed for moderate pain.   albuterol 108 (90 Base) MCG/ACT inhaler Commonly known as:  VENTOLIN HFA Inhale 2 puffs into the lungs every 6 (six) hours as needed for wheezing or shortness of breath.   cyclobenzaprine 5 MG tablet Commonly known as:  FLEXERIL Take 1 tablet (5 mg total) by mouth at bedtime as needed.   Diclofenac Sodium 3 % Crea Apply 1 application topically 2 (two) times daily as needed.   Diclofenac Sodium 2 % Soln 2 pumps BID   loratadine 10 MG tablet Commonly known as:  CLARITIN Take 10 mg by mouth daily as needed for allergies.   methocarbamol 750 MG tablet Commonly known as:  ROBAXIN Take 1 tablet (750 mg total) by mouth 2 (two) times daily as needed for muscle spasms.   ondansetron 4 MG tablet Commonly known as:  ZOFRAN Take 1-2 tablets (4-8 mg total) by mouth every 8 (eight) hours as needed for nausea or vomiting.   oxyCODONE 10 mg 12 hr tablet Commonly known as:  OXYCONTIN Take 1 tablet (10 mg total) by mouth every 12 (twelve) hours for 3 days.   oxyCODONE 5 MG immediate release tablet Commonly known as:  Oxy IR/ROXICODONE Take 1-3 tablets (5-15 mg total) by mouth 3 (three) times daily as needed.   polyethylene glycol powder 17 GM/SCOOP powder Commonly known as:  GLYCOLAX/MIRALAX Take 17 g by mouth 2 (two) times daily as needed. What changed:  reasons to take this   promethazine 25 MG  tablet Commonly known as:  PHENERGAN Take 1 tablet (25 mg total) by mouth every 6 (six) hours as needed for nausea.    rivaroxaban 10 MG Tabs tablet Commonly known as:  XARELTO Take 1 tablet (10 mg total) by mouth daily.   senna-docusate 8.6-50 MG tablet Commonly known as:  Senokot S Take 1-2 tablets by mouth at bedtime as needed.   traMADol 50 MG tablet Commonly known as:  ULTRAM Take 1 tablet (50 mg total) by mouth every 8 (eight) hours as needed.   VISINE OP Place 1 drop into both eyes daily as needed (allergies).            Durable Medical Equipment  (From admission, onward)         Start     Ordered   07/29/18 1616  DME Walker rolling  Once    Question:  Patient needs a walker to treat with the following condition  Answer:  Total knee replacement status   07/29/18 1615   07/29/18 1616  DME 3 n 1  Once     07/29/18 1615   07/29/18 1616  DME Bedside commode  Once    Question:  Patient needs a bedside commode to treat with the following condition  Answer:  Total knee replacement status   07/29/18 1615          Diagnostic Studies: Dg Chest 2 View  Result Date: 07/26/2018 CLINICAL DATA:  Preoperative for total knee arthroplasty. EXAM: CHEST - 2 VIEW COMPARISON:  03/01/2013 chest radiograph. FINDINGS: Stable cardiomediastinal silhouette with normal heart size. No pneumothorax. No pleural effusion. Lungs appear clear, with no acute consolidative airspace disease and no pulmonary edema. IMPRESSION: No active cardiopulmonary disease. Electronically Signed   By: Ilona Sorrel M.D.   On: 07/26/2018 15:06   Dg Knee Right Port  Result Date: 07/29/2018 CLINICAL DATA:  58 year old female with arthroplasty EXAM: PORTABLE RIGHT KNEE - 1-2 VIEW COMPARISON:  04/24/2013 FINDINGS: Surgical changes of right knee arthroplasty. Gas within the joint. Soft tissue swelling. No radiopaque foreign body. Joint is aligned. No acute fracture. IMPRESSION: Early surgical changes of right knee arthroplasty. Electronically Signed   By: Corrie Mckusick D.O.   On: 07/29/2018 15:35    Disposition: Discharge disposition:  01-Home or Self Care         Follow-up Information    Leandrew Koyanagi, MD In 2 weeks.   Specialty:  Orthopedic Surgery Why:  For suture removal, For wound re-check Contact information: [place] [id number] [phone]            Signed: Mcarthur Rossetti 07/30/2018, 9:46 AM

## 2018-07-30 NOTE — TOC Initial Note (Signed)
Transition of Care [facility]) - Initial/Assessment Note    Patient Details  Name: Anna Berry MRN: [id number] Date of Birth: 31-Jul-1960  Transition of Care [facility]) CM/SW Contact:    Erenest Rasher, RN Phone Number: 07/30/2018, 12:14 PM  Clinical Narrative:                 Spoke to pt and offered choice for [facility]. Pt agreeable to [facility] for [place]. States her husband will assist as needed. [place] for DME to be deliver to room prior to dc. Contacted [facility] with new referral. Able to accept referral.   Expected Discharge Plan: [place] Barriers to Discharge: No Barriers Identified   Patient Goals and CMS Choice Patient states their goals for this hospitalization and ongoing recovery are:: glad to have surgery CMS Medicare.gov Compare Post Acute Care list provided to:: Patient Choice offered to / list presented to : Patient  Expected Discharge Plan and Services Expected Discharge Plan: [place]   Discharge Planning Services: CM Consult Post Acute Care Choice: Home Health, Durable Medical Equipment Living arrangements for the past 2 months: Single Family Home Expected Discharge Date: 07/30/18                 DME Agency: [employer] Date DME Agency Contacted: 07/30/18 Time DME Agency Contacted: 1000 Representative spoke with at DME Agency: [place]: PT [place] Agency: [facility] (formerly [employer]) Date [place]: 07/30/18 Time [place]: 37 Representative spoke with at [place]: Graciella Freer  Prior Living Arrangements/Services Living arrangements for the past 2 months: [place] Lives with:: Spouse Patient language and need for interpreter reviewed:: Yes Do you feel safe going back to the place where you live?: Yes      Need for Family Participation in Patient Care: Yes (Comment) Care giver support system in place?: Yes (comment)   Criminal Activity/Legal Involvement  Pertinent to Current Situation/Hospitalization: No - Comment as needed  Activities of Daily Living Home Assistive Devices/Equipment: None ADL Screening (condition at time of admission) Patient's cognitive ability adequate to safely complete daily activities?: Yes Is the patient deaf or have difficulty hearing?: No Does the patient have difficulty seeing, even when wearing glasses/contacts?: No Does the patient have difficulty concentrating, remembering, or making decisions?: No Patient able to express need for assistance with ADLs?: Yes Does the patient have difficulty dressing or bathing?: No Independently performs ADLs?: Yes (appropriate for developmental age) Does the patient have difficulty walking or climbing stairs?: No Weakness of Legs: None Weakness of Arms/Hands: Right(slight weakness of right arm/tingling)  Permission Sought/Granted Permission sought to share information with : Case Manager, PCP, Family Supports, Other (comment) Permission granted to share information with : Yes, Verbal Permission Granted  Share Information with NAME: [place] granted to share info w AGENCY: [facility], [place] granted to share info w Relationship: husband  Permission granted to share info w Contact Information: [phone]  Emotional Assessment   Attitude/Demeanor/Rapport: Engaged Affect (typically observed): Accepting Orientation: : Oriented to Self, Oriented to Place, Oriented to  Time, Oriented to Situation   Psych Involvement: No (comment)  Admission diagnosis:  right knee degenerative joint disease Patient Active Problem List   Diagnosis Date Noted  . Total knee replacement status 07/29/2018  . Metatarsalgia of both feet 01/18/2018  . Right arm weakness 06/18/2017  . Primary osteoarthritis of left knee 12/18/2016  .  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 Obesity (BMI 30-39.9) 05/11/2012  . AC (acromioclavicular) joint arthritis  07/23/2011  . Right knee pain 06/22/2011  . Depression 11/26/2010  . 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 Asthma 12/29/2009  . POSTMENOPAUSAL SYNDROME 08/26/2009  . Osteoarthritis of right knee 08/26/2009  . HEMATURIA UNSPECIFIED 05/21/2008  . ALLERGIC RHINITIS 05/29/2007  . SICKLE CELL TRAIT 05/13/2006   PCP:  Nuala Alpha, DO Pharmacy:   [facility] [address] [place] [address] Phone: [phone] Fax: [facility], [place] [address] Phone: [phone] Fax: [phone]  [facility], [place] [address] Phone: [phone] Fax: [phone]     Social Determinants of Health (SDOH) Interventions    Readmission Risk Interventions No flowsheet data found.

## 2018-08-01 ENCOUNTER — Telehealth: Payer: Self-pay | Admitting: Orthopaedic Surgery

## 2018-08-01 NOTE — Telephone Encounter (Signed)
Received call from Verdis Frederickson (PT) with [facility] needing verbal orders for [place] [facility]   3 times a week for 2 weeks    The number to contact Verdis Frederickson is [phone]

## 2018-08-01 NOTE — Telephone Encounter (Signed)
Called back to approve orders  

## 2018-08-02 ENCOUNTER — Encounter (HOSPITAL_COMMUNITY): Payer: Self-pay | Admitting: Orthopaedic Surgery

## 2018-08-05 ENCOUNTER — Telehealth: Payer: Self-pay | Admitting: Orthopaedic Surgery

## 2018-08-05 MED ORDER — OXYCODONE HCL 5 MG PO CAPS: ORAL_CAPSULE | ORAL | 0 refills | Status: DC

## 2018-08-05 NOTE — Telephone Encounter (Signed)
Pt called in said she needs a refill of her medication for Oxycodone.  [phone]

## 2018-08-05 NOTE — Telephone Encounter (Signed)
Just sent in

## 2018-08-11 ENCOUNTER — Ambulatory Visit (INDEPENDENT_AMBULATORY_CARE_PROVIDER_SITE_OTHER): Payer: 59 | Admitting: Orthopaedic Surgery

## 2018-08-11 ENCOUNTER — Encounter: Payer: Self-pay | Admitting: Orthopaedic Surgery

## 2018-08-11 ENCOUNTER — Other Ambulatory Visit: Payer: Self-pay

## 2018-08-11 DIAGNOSIS — Z96651 Presence of right artificial knee joint: Secondary | ICD-10-CM

## 2018-08-11 MED ORDER — RIVAROXABAN 10 MG PO TABS: 10.0000 mg | ORAL_TABLET | Freq: Every day | ORAL | 0 refills | Status: DC

## 2018-08-11 MED ORDER — OXYCODONE HCL 5 MG PO CAPS: ORAL_CAPSULE | ORAL | 0 refills | Status: DC

## 2018-08-11 NOTE — Progress Notes (Signed)
Post-Op Visit Note   Patient: Anna Berry           Date of Birth: [birth year]           MRN: [id number] Visit Date: 08/11/2018 PCP: Nuala Alpha, DO   Assessment & Plan:  Chief Complaint:  Chief Complaint  Patient presents with  . Right Knee - Pain   Visit Diagnoses:  1. History of total knee replacement, right     Plan: Patient is a pleasant 57 year old female presents our clinic today 13 days status post right total knee replacement, date of surgery 07/29/2018.  She has been doing well.  She has been taking 2 oxycodone's every 4 hours for pain.  She has been getting home health physical therapy where she is progressing.  She is currently ambulating with a cane.  No fevers or chills.  Examination of her right knee reveals a well-healed surgical incision without evidence of cellulitis or infection.  She did have some dried bloody drainage to the distal aspect which does not appear to be currently draining.  Moderate swelling to the right lower extremity.  Calf is soft and nontender.  At this point, we will apply Steri-Strips to the incision.  She will continue to ice and elevate for pain and swelling.  She will continue to wear her compression socks.  She has 1 session of home health PT left for tomorrow and we will then transition her to an outpatient physical therapy setting.  A prescription was provided today for this.  I will refill her oxycodone but we will start to wean.  She will follow-up with [place] in 4 weeks time for repeat evaluation and x-rays.  Call with concerns or questions in the meantime.  Follow-Up Instructions: Return in about 4 weeks (around 09/08/2018).   Orders:  No orders of the defined types were placed in this encounter.  Meds ordered this encounter  Medications  . rivaroxaban (XARELTO) 10 MG TABS tablet    Sig: Take 1 tablet (10 mg total) by mouth daily.    Dispense:  14 tablet    Refill:  0  . oxycodone (OXY-IR) 5 MG capsule    Sig: Take 1-2 tabs po  q6-8 hours prn pain    Dispense:  30 capsule    Refill:  0    Imaging: No new imaging   PMFS History: Patient Active Problem List   Diagnosis Date Noted  . History of total knee replacement, 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  . Metatarsalgia of both feet 01/18/2018  . Right arm weakness 06/18/2017  . Primary osteoarthritis of left knee 12/18/2016  . 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 Obesity (BMI 30-39.9) 05/11/2012  . 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 Right knee pain 06/22/2011  . Depression 11/26/2010  . 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 Asthma 12/29/2009  . POSTMENOPAUSAL SYNDROME 08/26/2009  . Osteoarthritis of right knee 08/26/2009  . HEMATURIA UNSPECIFIED 05/21/2008  . ALLERGIC RHINITIS 05/29/2007  . SICKLE CELL TRAIT 05/13/2006   Past Medical History:  Diagnosis Date  . Allergy   . Anemia   . Arthritis   . Asthma    allergies induced  . Bilateral lower extremity edema   . Depression   . Fibroids 08/2002  . Gross hematuria 05/2010  . History of appendicitis   . History of colon polyps 2017  . Multiple renal cysts 05/24/2008   Two left renal parapelvic cysts.  . OA (osteoarthritis) of knee    Right  .  Obesity   . Plantar fasciitis   . Postmenopausal   . Pre-diabetes   . 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 Seasonal allergies   . Sickle cell anemia (HCC)    sickle cell trait  . Sickle cell trait ([place])   . Sigmoid diverticulosis 09/26/2015   Noted on colonoscopy    Family History  Problem Relation Age of Onset  . Stomach cancer Maternal Grandmother 78  . Stroke Maternal Grandmother   . Diabetes Father   . Kidney disease Father   . Colon cancer Neg Hx     Past Surgical History:  Procedure Laterality Date  . ABDOMINAL HYSTERECTOMY  2004  . APPENDECTOMY  2006  . [place]   x2  . COLONOSCOPY W/ POLYPECTOMY  09/26/2015  . TOTAL KNEE ARTHROPLASTY Right 07/29/2018   Procedure: RIGHT TOTAL KNEE ARTHROPLASTY;   Surgeon: Leandrew Koyanagi, MD;  Location: [facility];  Service: Orthopedics;  Laterality: Right;   Social History   Occupational History  . Not on file  Tobacco Use  . Smoking status: Never Smoker  . Smokeless tobacco: Never Used  Substance and Sexual Activity  . Alcohol use: No    Alcohol/week: 0.0 standard drinks  . Drug use: No  . Sexual activity: Yes    Birth control/protection: Surgical

## 2018-08-13 ENCOUNTER — Telehealth: Payer: Self-pay | Admitting: Internal Medicine

## 2018-08-13 DIAGNOSIS — Z20822 Contact with and (suspected) exposure to covid-19: Secondary | ICD-10-CM

## 2018-08-13 NOTE — Telephone Encounter (Signed)
  Spoke with the patient about possible Covid-19 exposure at 08/03/18 office visit to [facility]. RN explained the free Covid-19 screening offered for this exposure. Notified that she and her driver should wear a masks and the drive-up screening will take place at the old [facility] on [address].  Patient agreed to come to [facility], 08/14/18 @ 1330.

## 2018-08-14 ENCOUNTER — Other Ambulatory Visit: Payer: 59

## 2018-08-15 LAB — NOVEL CORONAVIRUS, NAA: SARS-CoV-2, NAA: NOT DETECTED

## 2018-08-17 ENCOUNTER — Telehealth: Payer: Self-pay | Admitting: Orthopaedic Surgery

## 2018-08-17 ENCOUNTER — Other Ambulatory Visit: Payer: Self-pay | Admitting: Physician Assistant

## 2018-08-17 MED ORDER — OXYCODONE HCL 5 MG PO CAPS: ORAL_CAPSULE | ORAL | 0 refills | Status: DC

## 2018-08-17 NOTE — Telephone Encounter (Signed)
SEE MESSAGE

## 2018-08-17 NOTE — Telephone Encounter (Signed)
Called in.

## 2018-08-17 NOTE — Telephone Encounter (Signed)
Patient called requested a refill of Oxycodone.   \Please call patent to advise.

## 2018-08-22 ENCOUNTER — Telehealth: Payer: Self-pay | Admitting: *Deleted

## 2018-08-22 NOTE — Telephone Encounter (Signed)
Pt given result of COVID test from 08/14/2018; result not detected; she verbalizes understanding; the would like a copy of her test results so that she can start physical therapy; she does not have access to my chart; pt advised to sign up for my chart in order to print a copy of her results, or contact her ortho office; she verbalized understanding.

## 2018-08-24 ENCOUNTER — Telehealth: Payer: Self-pay | Admitting: Orthopaedic Surgery

## 2018-08-24 DIAGNOSIS — Z96651 Presence of right artificial knee joint: Secondary | ICD-10-CM

## 2018-08-24 NOTE — Telephone Encounter (Signed)
Sam form [facility] called and requesting her RX from [place] regarding total knee

## 2018-08-24 NOTE — Telephone Encounter (Signed)
Entered into system and faxed to [facility].

## 2018-08-31 ENCOUNTER — Encounter: Payer: Self-pay | Admitting: Gastroenterology

## 2018-09-05 ENCOUNTER — Telehealth: Payer: Self-pay | Admitting: Orthopaedic Surgery

## 2018-09-05 NOTE — Telephone Encounter (Signed)
Patient called left voicemail message needing Rx refilled (Oxycodone) The number to contact patient is [phone]

## 2018-09-06 ENCOUNTER — Other Ambulatory Visit: Payer: Self-pay | Admitting: Physician Assistant

## 2018-09-06 MED ORDER — OXYCODONE HCL 5 MG PO CAPS: ORAL_CAPSULE | ORAL | 0 refills | Status: DC

## 2018-09-06 NOTE — Telephone Encounter (Signed)
Just sent in

## 2018-09-06 NOTE — Telephone Encounter (Signed)
Please advise 

## 2018-09-08 ENCOUNTER — Encounter: Payer: Self-pay | Admitting: Orthopaedic Surgery

## 2018-09-08 ENCOUNTER — Ambulatory Visit (INDEPENDENT_AMBULATORY_CARE_PROVIDER_SITE_OTHER): Payer: 59

## 2018-09-08 ENCOUNTER — Other Ambulatory Visit: Payer: Self-pay

## 2018-09-08 ENCOUNTER — Ambulatory Visit (INDEPENDENT_AMBULATORY_CARE_PROVIDER_SITE_OTHER): Payer: 59 | Admitting: Physician Assistant

## 2018-09-08 DIAGNOSIS — Z96651 Presence of right artificial knee joint: Secondary | ICD-10-CM

## 2018-09-08 NOTE — Progress Notes (Signed)
Post-Op Visit Note   Patient: Anna Berry           Date of Birth: 04-11-1960           MRN: [id number] Visit Date: 09/08/2018 PCP: Nuala Alpha, DO   Assessment & Plan:  Chief Complaint:  Chief Complaint  Patient presents with  . Right Knee - Follow-up   Visit Diagnoses:  1. History of total knee replacement, right     Plan: Patient is a pleasant 58 year old female who presents our clinic today 41 days status post right total knee replacement, date of surgery 07/29/2018.  She has been doing fairly well.  She still exhibits some pain especially with physical therapy.  She is continuing to work with physical therapy where she is still lacking several degrees of extension and flexion.  Overall, making progress. Examination of the right knee reveals ROM from 25 flexion contracture to 90 degrees of flexion.  Stable to valgus and varus stress.  Calf is soft and non-tender.  At this point, we recommend a flex nadir.  We have also demonstrated to the patient certain exercises for range of motion.  She will continue to really push things with physical therapy.  We discussed possible manipulation under anesthesia.  She will follow-up with [place] in 3 weeks time for recheck.  Follow-Up Instructions: Return in about 3 weeks (around 09/29/2018).   Orders:  Orders Placed This Encounter  Procedures  . XR KNEE 3 VIEW RIGHT   No orders of the defined types were placed in this encounter.   Imaging: Xr Knee 3 View Right  Result Date: 09/08/2018 Well seated prosthesis without complication   PMFS History: Patient Active Problem List   Diagnosis Date Noted  . History of total knee replacement, 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  . Metatarsalgia of both feet 01/18/2018  . Right arm weakness 06/18/2017  . Primary osteoarthritis of left knee 12/18/2016  . 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 Obesity (BMI 30-39.9) 05/11/2012  . 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 Right knee pain  06/22/2011  . Depression 11/26/2010  . 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 Asthma 12/29/2009  . POSTMENOPAUSAL SYNDROME 08/26/2009  . Osteoarthritis of right knee 08/26/2009  . HEMATURIA UNSPECIFIED 05/21/2008  . ALLERGIC RHINITIS 05/29/2007  . SICKLE CELL TRAIT 05/13/2006   Past Medical History:  Diagnosis Date  . Allergy   . Anemia   . Arthritis   . Asthma    allergies induced  . Bilateral lower extremity edema   . Depression   . Fibroids 08/2002  . Gross hematuria 05/2010  . History of appendicitis   . History of colon polyps 2017  . Multiple renal cysts 05/24/2008   Two left renal parapelvic cysts.  . OA (osteoarthritis) of knee    Right  . Obesity   . Plantar fasciitis   . Postmenopausal   . Pre-diabetes   . 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 Seasonal allergies   . Sickle cell anemia (HCC)    sickle cell trait  . Sickle cell trait ([place])   . Sigmoid diverticulosis 09/26/2015   Noted on colonoscopy    Family History  Problem Relation Age of Onset  . Stomach cancer Maternal Grandmother 78  . Stroke Maternal Grandmother   . Diabetes Father   . Kidney disease Father   . Colon cancer Neg Hx     Past Surgical History:  Procedure Laterality Date  . ABDOMINAL HYSTERECTOMY  2004  . APPENDECTOMY  2006  . [place]   x2  . COLONOSCOPY W/ POLYPECTOMY  09/26/2015  . TOTAL KNEE ARTHROPLASTY Right 07/29/2018   Procedure: RIGHT TOTAL KNEE ARTHROPLASTY;  Surgeon: Leandrew Koyanagi, MD;  Location: [facility];  Service: Orthopedics;  Laterality: Right;   Social History   Occupational History  . Not on file  Tobacco Use  . Smoking status: Never Smoker  . Smokeless tobacco: Never Used  Substance and Sexual Activity  . Alcohol use: No    Alcohol/week: 0.0 standard drinks  . Drug use: No  . Sexual activity: Yes    Birth control/protection: Surgical

## 2018-09-15 ENCOUNTER — Telehealth: Payer: Self-pay | Admitting: Orthopaedic Surgery

## 2018-09-15 NOTE — Telephone Encounter (Signed)
Received a vm fom patient checking on foms that was sent last week. IC her back and lmvm. I advised forms have been completed for [employer] per [employer]. I stated for her to call me back if she is checking on different forms. pts # [phone]

## 2018-09-15 NOTE — Telephone Encounter (Signed)
Patient called back, she wasn't sure if additional forms were faxed but Shawna [place] needed additional info. I faxed 09/08/2018 ov note & re faxed the form [phone]. Advised her that if they still need anything additional, to have them call or fax [place].

## 2018-09-27 ENCOUNTER — Telehealth: Payer: Self-pay | Admitting: Orthopaedic Surgery

## 2018-09-27 NOTE — Telephone Encounter (Signed)
Re faxed form as this msg states wasn't received. Once we receive new forms, they will be forwarded to [employer] to be completed.

## 2018-09-27 NOTE — Telephone Encounter (Signed)
Patient called advised she just spoke with Corrine with [facility] financial and the information was not received. Patient said Corrine is faxing more paperwork over for the doctor to complete to extend her S/T disability. Please fax information  Attn. Corrine  The number to contact patient is [phone]

## 2018-09-27 NOTE — Telephone Encounter (Signed)
Anna Berry/Anna Berry called and wanted to know if Anna Berry had time to review Rx for the knee flexinoter. The order was originally faxed and was told it was hand delivered to Anna Berry.   Also, he spoke to PT and PT os recommending extenionater   Please call Anna Berry @ [phone] . He stated he can come by to pick up if needed.

## 2018-10-04 ENCOUNTER — Encounter: Payer: Self-pay | Admitting: Gastroenterology

## 2018-10-04 ENCOUNTER — Telehealth: Payer: Self-pay | Admitting: Orthopaedic Surgery

## 2018-10-04 NOTE — Telephone Encounter (Signed)
Patient called back with a different fax number given to her by Corrine at [facility]. Forms & 6/25 ov note faxed [phone]. I advised pt that the previous number we have faxed to was documented by [employer] when they initially faxed forms. (The new set of forms received was a duplicate)

## 2018-10-04 NOTE — Telephone Encounter (Signed)
Patient called stating [employer] still hasn't received forms that have been faxed multiple times. They have also faxed another set of forms. I advised her to try to get a different fax number for [place] to fax to. Also, advised her to ask Corrine w/ [employer] if the new forms are for updated disability or duplicate forms.She will call me back

## 2018-10-19 ENCOUNTER — Other Ambulatory Visit: Payer: Self-pay

## 2018-10-19 DIAGNOSIS — Z20822 Contact with and (suspected) exposure to covid-19: Secondary | ICD-10-CM

## 2018-10-20 ENCOUNTER — Ambulatory Visit: Payer: 59 | Admitting: Orthopaedic Surgery

## 2018-10-20 LAB — NOVEL CORONAVIRUS, NAA: SARS-CoV-2, NAA: NOT DETECTED

## 2018-10-24 ENCOUNTER — Ambulatory Visit (AMBULATORY_SURGERY_CENTER): Payer: Self-pay | Admitting: *Deleted

## 2018-10-24 ENCOUNTER — Other Ambulatory Visit: Payer: Self-pay

## 2018-10-24 VITALS — Temp 96.9°F | Ht 66.0 in | Wt 189.0 lb

## 2018-10-24 DIAGNOSIS — Z86010 Personal history of colonic polyps: Secondary | ICD-10-CM

## 2018-10-24 MED ORDER — SUPREP BOWEL PREP KIT 17.5-3.13-1.6 GM/177ML PO SOLN: 1.0000 | Freq: Once | ORAL | 0 refills | Status: AC

## 2018-10-24 NOTE — Progress Notes (Signed)
No egg or soy allergy known to patient  No issues with past sedation with any surgeries  or procedures, no intubation problems  No diet pills per patient No home 02 use per patient  No blood thinners per patient  Pt denies issues with constipation  No A fib or A flutter  EMMI video sent to pt's e mail   Suprep $15 coupon   Pt has had 3 negative COVID tests - last test 10-19-2018 due to covid exposure taking church member that was + to MD- church member was not forth coming about having [facility] - pt aware she will have to call if she develops ANY s/s between now and her 8-26 colon - she currently has no s/s

## 2018-10-26 ENCOUNTER — Encounter: Payer: Self-pay | Admitting: Orthopaedic Surgery

## 2018-10-26 ENCOUNTER — Ambulatory Visit (INDEPENDENT_AMBULATORY_CARE_PROVIDER_SITE_OTHER): Payer: 59 | Admitting: Orthopaedic Surgery

## 2018-10-26 DIAGNOSIS — Z96651 Presence of right artificial knee joint: Secondary | ICD-10-CM

## 2018-10-26 MED ORDER — AMOXICILLIN 500 MG PO CAPS: 2000.0000 mg | ORAL_CAPSULE | Freq: Once | ORAL | 6 refills | Status: AC

## 2018-10-26 NOTE — Progress Notes (Signed)
Patient ID: Anna Berry, female   DOB: Dec 11, 1960, 58 y.o.   MRN: [id number]  Anna Berry is approximately 3 months status post right total knee replacement.  She is doing well and not requiring any pain medicines.  She has finished physical therapy.  She still has some soreness in the right knee but overall she is very happy with her knee replacement.  Her range of motion has progressed very well to approximately 105 degrees.  She is eager to return back to work next Tuesday.  Work note was provided today.  Amoxicillin was also sent to the pharmacy for her upcoming colonoscopy.  I would like to see her back in another 3 months with 2 view x-rays of the right knee.   3 month TKA follow up plan  Patient now 3 months status post right total knee arthroplasty. Wound is healed with no signs of complications or infection.  The patient does not complain of pain, and is back to normal daily activities. It was reinforced that prophylactic antibiotics should be taken with any procedure including but not limited to dental work or colonoscopies.  We will plan on following up at the 6 month postop visit with 2 view xrays of the operative knee at that time. As always, instructions were given to call with any questions or concerns in the interim.

## 2018-11-08 ENCOUNTER — Telehealth: Payer: Self-pay | Admitting: Gastroenterology

## 2018-11-08 NOTE — Telephone Encounter (Signed)

## 2018-11-08 NOTE — Telephone Encounter (Signed)
Pt is scheduled for colonoscopy but reported that she had total knee replacement.  Pt is taking amox 500 mg.  Please advise.

## 2018-11-08 NOTE — Telephone Encounter (Signed)
Patient called to confirm it is ok to to take prophylactic  antibiotic in the morning prescribed by her ortho surgeon in preparation of colonoscopy.  Advised it would be ok to take in morning before procedure with her prep.

## 2018-11-09 ENCOUNTER — Other Ambulatory Visit: Payer: Self-pay

## 2018-11-09 ENCOUNTER — Encounter: Payer: Self-pay | Admitting: Gastroenterology

## 2018-11-09 ENCOUNTER — Ambulatory Visit (AMBULATORY_SURGERY_CENTER): Payer: 59 | Admitting: Gastroenterology

## 2018-11-09 VITALS — BP 135/85 | HR 80 | Temp 99.1°F | Resp 20 | Ht 66.0 in | Wt 189.0 lb

## 2018-11-09 DIAGNOSIS — D120 Benign neoplasm of cecum: Secondary | ICD-10-CM | POA: Diagnosis not present

## 2018-11-09 DIAGNOSIS — Z86010 Personal history of colonic polyps: Secondary | ICD-10-CM | POA: Diagnosis present

## 2018-11-09 MED ORDER — SODIUM CHLORIDE 0.9 % IV SOLN: 500.0000 mL | Freq: Once | INTRAVENOUS | Status: DC

## 2018-11-09 NOTE — Op Note (Addendum)
[facility] Patient Name: Anna Berry Procedure Date: 11/09/2018 1:36 PM MRN: [id number] Endoscopist: Mauri Pole , MD Age: 58 Referring MD:  Date of Birth: 08/04/1960 Gender: Female Account #: [id number] Procedure:                Colonoscopy Indications:              High risk colon cancer surveillance: Personal                            history of adenoma (10 mm or greater in size), High                            risk colon cancer surveillance: Personal history of                            multiple (3 or more) adenomas Medicines:                Monitored Anesthesia Care Procedure:                Pre-Anesthesia Assessment:                           - Prior to the procedure, a History and Physical                            was performed, and patient medications and                            allergies were reviewed. The patient's tolerance of                            previous anesthesia was also reviewed. The risks                            and benefits of the procedure and the sedation                            options and risks were discussed with the patient.                            All questions were answered, and informed consent                            was obtained. Prior Anticoagulants: The patient has                            taken no previous anticoagulant or antiplatelet                            agents. ASA Grade Assessment: II - A patient with                            mild systemic disease. After reviewing the risks  and benefits, the patient was deemed in                            satisfactory condition to undergo the procedure.                           After obtaining informed consent, the colonoscope                            was passed under direct vision. Throughout the                            procedure, the patient's blood pressure, pulse, and                            oxygen saturations were  monitored continuously. The                            Colonoscope was introduced through the anus and                            advanced to the the cecum, identified by                            appendiceal orifice and ileocecal valve. The                            colonoscopy was performed without difficulty. The                            patient tolerated the procedure well. The quality                            of the bowel preparation was adequate. The                            ileocecal valve, appendiceal orifice, and rectum                            were photographed. Scope In: 1:44:13 PM Scope Out: 2:00:25 PM Scope Withdrawal Time: 0 hours 13 minutes 3 seconds  Total Procedure Duration: 0 hours 16 minutes 12 seconds  Findings:                 The perianal and digital rectal examinations were                            normal.                           Multiple small and large-mouthed diverticula were                            found in the sigmoid colon, descending colon,  transverse colon and ascending colon.                           Non-bleeding internal hemorrhoids were found during                            retroflexion. The hemorrhoids were medium-sized.                           A less than 1 mm polyp was found in the cecum. The                            polyp was sessile. The polyp was removed with a                            cold biopsy forceps. Resection and retrieval were                            complete. Complications:            No immediate complications. Estimated Blood Loss:     Estimated blood loss was minimal. Impression:               - Moderate diverticulosis in the sigmoid colon, in                            the descending colon, in the transverse colon and                            in the ascending colon.                           - Non-bleeding internal hemorrhoids.                           - One less than 1 mm  polyp in the cecum, removed                            with a cold biopsy forceps. Resected and retrieved. Recommendation:           - Patient has a contact number available for                            emergencies. The signs and symptoms of potential                            delayed complications were discussed with the                            patient. Return to normal activities tomorrow.                            Written discharge instructions were provided to the  patient.                           - Resume previous diet.                           - Continue present medications.                           - Await pathology results.                           - Repeat colonoscopy in 5 years for surveillance. Mauri Pole, MD 11/09/2018 2:06:17 PM This report has been signed electronically.

## 2018-11-09 NOTE — Progress Notes (Signed)
PT taken to PACU. Monitors in place. VSS. Report given to RN. 

## 2018-11-09 NOTE — Progress Notes (Signed)
Pt denies urge to pass air.  Abdomen soft and palpable, no pain.  Told to drink warm fluids and ambulate at home if she has abdominal discomfort- understanding voiced

## 2018-11-09 NOTE — Progress Notes (Signed)
Called to room to assist during endoscopic procedure.  Patient ID and intended procedure confirmed with present staff. Received instructions for my participation in the procedure from the performing physician.  

## 2018-11-09 NOTE — Patient Instructions (Signed)
YOU HAD AN ENDOSCOPIC PROCEDURE TODAY AT [facility]:   Refer to the procedure report that was given to you for any specific questions about what was found during the examination.  If the procedure report does not answer your questions, please call your gastroenterologist to clarify.  If you requested that your care partner not be given the details of your procedure findings, then the procedure report has been included in a sealed envelope for you to review at your convenience later.  YOU SHOULD EXPECT: Some feelings of bloating in the abdomen. Passage of more gas than usual.  Walking can help get rid of the air that was put into your GI tract during the procedure and reduce the bloating. If you had a lower endoscopy (such as a colonoscopy or flexible sigmoidoscopy) you may notice spotting of blood in your stool or on the toilet paper. If you underwent a bowel prep for your procedure, you may not have a normal bowel movement for a few days.  Please Note:  You might notice some irritation and congestion in your nose or some drainage.  This is from the oxygen used during your procedure.  There is no need for concern and it should clear up in a day or so.  SYMPTOMS TO REPORT IMMEDIATELY:   Following lower endoscopy (colonoscopy or flexible sigmoidoscopy):  Excessive amounts of blood in the stool  Significant tenderness or worsening of abdominal pains  Swelling of the abdomen that is new, acute  Fever of 100F or higher  For urgent or emergent issues, a gastroenterologist can be reached at any hour by calling [phone].   DIET:  We do recommend a small meal at first, but then you may proceed to your regular diet.  Drink plenty of fluids but you should avoid alcoholic beverages for 24 hours.  ACTIVITY:  You should plan to take it easy for the rest of today and you should NOT DRIVE or use heavy machinery until tomorrow (because of the sedation medicines used during the test).     FOLLOW UP: Our staff will call the number listed on your records 48-72 hours following your procedure to check on you and address any questions or concerns that you may have regarding the information given to you following your procedure. If we do not reach you, we will leave a message.  We will attempt to reach you two times.  During this call, we will ask if you have developed any symptoms of COVID 19. If you develop any symptoms (ie: fever, flu-like symptoms, shortness of breath, cough etc.) before then, please call [phone].  If you test positive for Covid 19 in the 2 weeks post procedure, please call and report this information to [place].    If any biopsies were taken you will be contacted by phone or by letter within the next 1-3 weeks.  Please call us at [phone] if you have not heard about the biopsies in 3 weeks.   SIGNATURES/CONFIDENTIALITY: You and/or your care partner have signed paperwork which will be entered into your electronic medical record.  These signatures attest to the fact that that the information above on your After Visit Summary has been reviewed and is understood.  Full responsibility of the confidentiality of this discharge information lies with you and/or your care-partner.  Await pathology- next colonoscopy 5 years  Please read over handouts about polyps, diverticulosis and hemorrhoids  Continue your normal medications  Apply Vaseline or Desitin to rectum  if needed

## 2018-11-09 NOTE — Progress Notes (Signed)
[name] states no medical or surgical changes since previsit or office visit. Covid screening and temp done by June. VS done by CW.

## 2018-11-11 ENCOUNTER — Telehealth: Payer: Self-pay | Admitting: *Deleted

## 2018-11-11 NOTE — Telephone Encounter (Signed)
  Follow up Call-  Call back number 11/09/2018  Post procedure Call Back phone  # [phone]  Permission to leave phone message Yes  Some recent data might be hidden     Patient questions:  Do you have a fever, pain , or abdominal swelling? No. Pain Score  0 *  Have you tolerated food without any problems? Yes.    Have you been able to return to your normal activities? Yes.    Do you have any questions about your discharge instructions: Diet   No. Medications  No. Follow up visit  No.  Do you have questions or concerns about your Care? No.  Actions: * If pain score is 4 or above: No action needed, pain <4.  1. Have you developed a fever since your procedure? no  2.   Have you had an respiratory symptoms (SOB or cough) since your procedure? no  3.   Have you tested positive for COVID 19 since your procedure no  4.   Have you had any family members/close contacts diagnosed with the COVID 19 since your procedure?  no   If yes to any of these questions please route to Joylene John, RN and Alphonsa Gin, Therapist, sports.

## 2018-11-17 IMAGING — MG DIGITAL SCREENING BILATERAL MAMMOGRAM WITH CAD
4 series · 4 of 4 positions shown · non-contrast
Comparison: Previous exam(s).

CLINICAL DATA: Screening.

EXAM:
DIGITAL SCREENING BILATERAL MAMMOGRAM WITH CAD

[L CC]
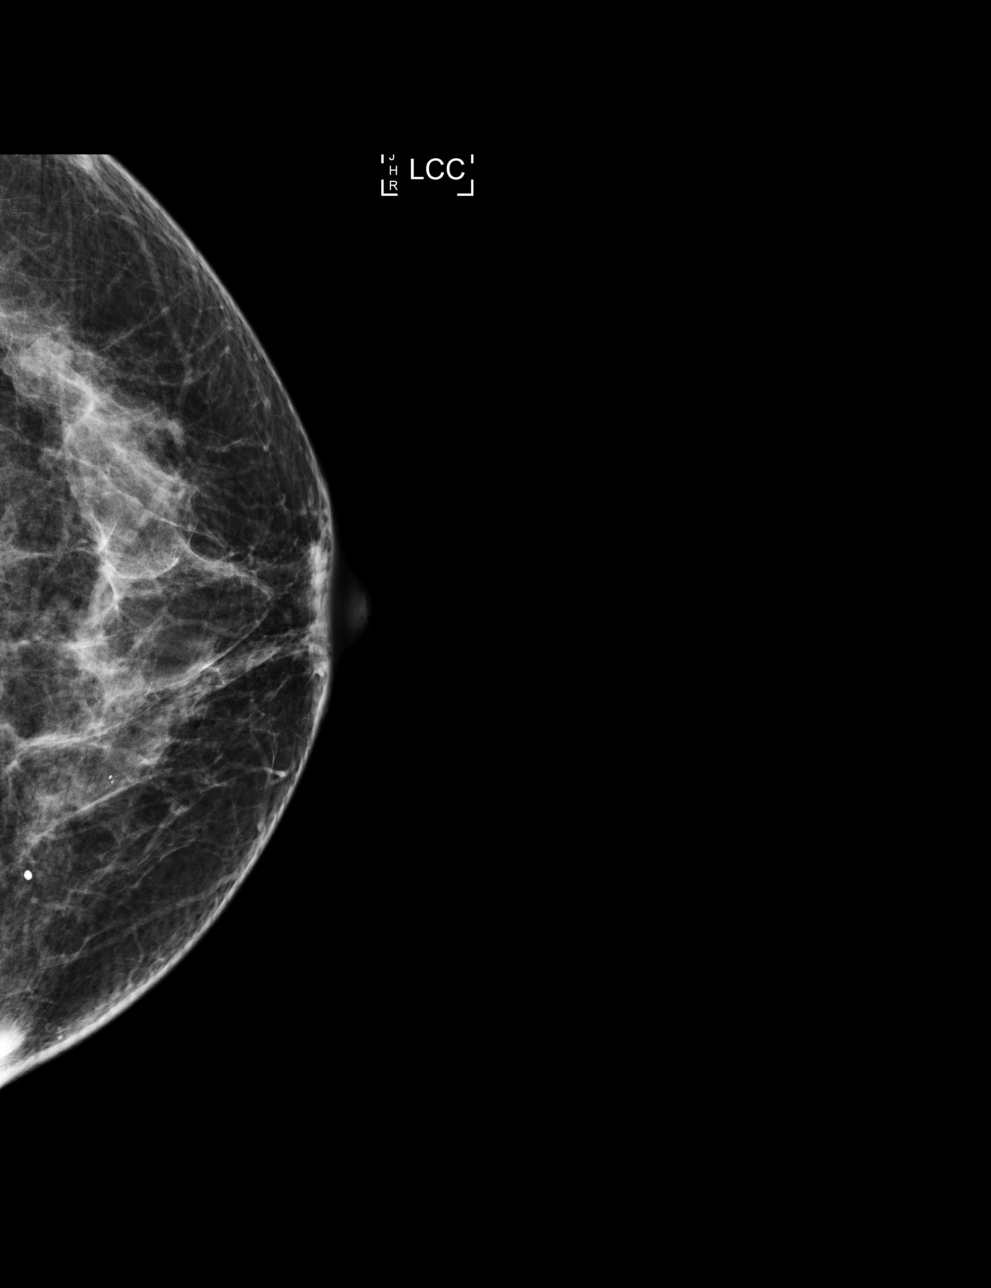

[L MLO]
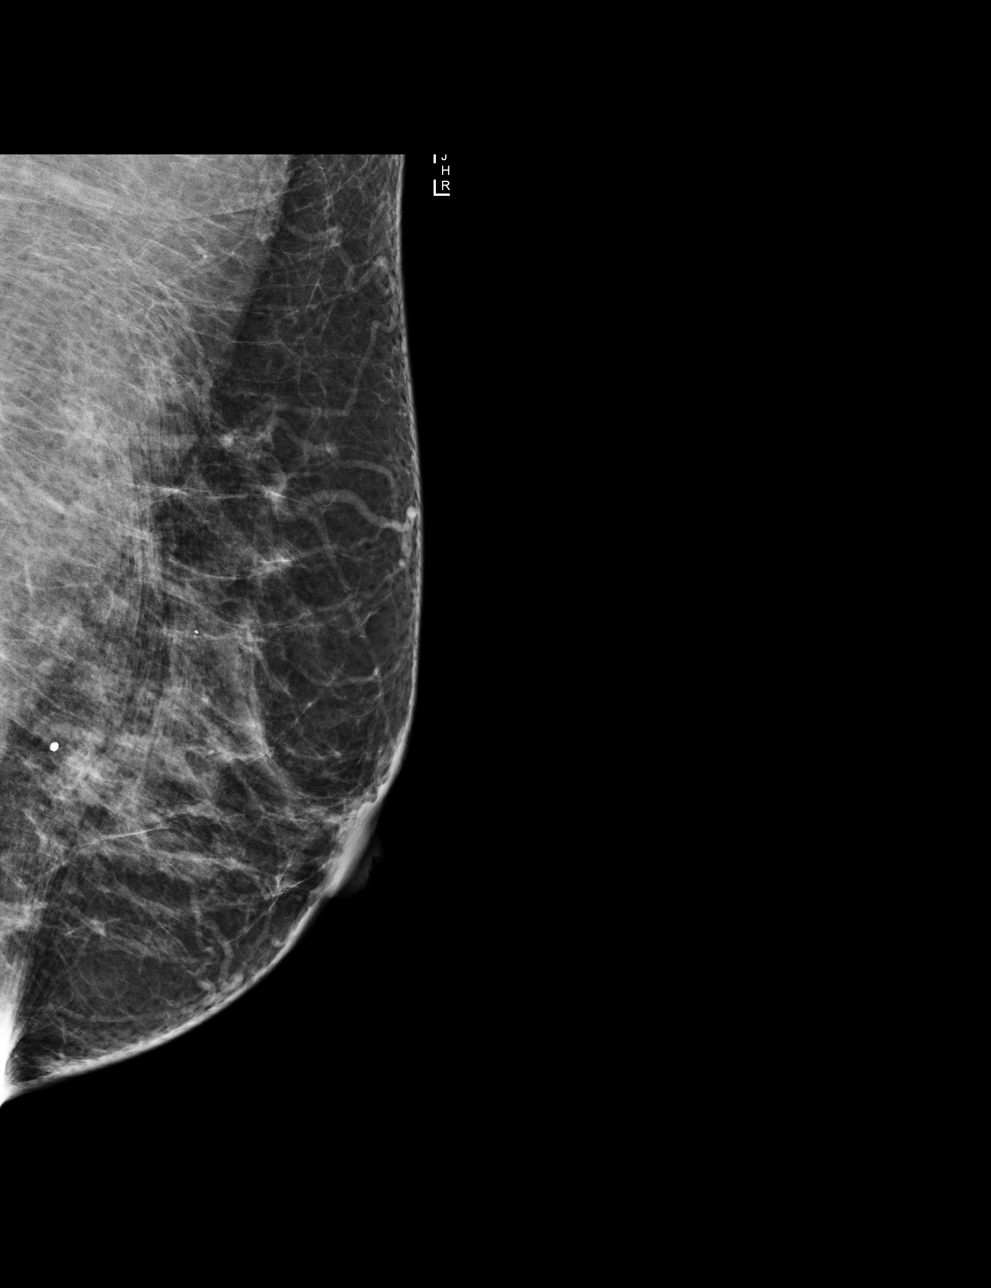

[R MLO]
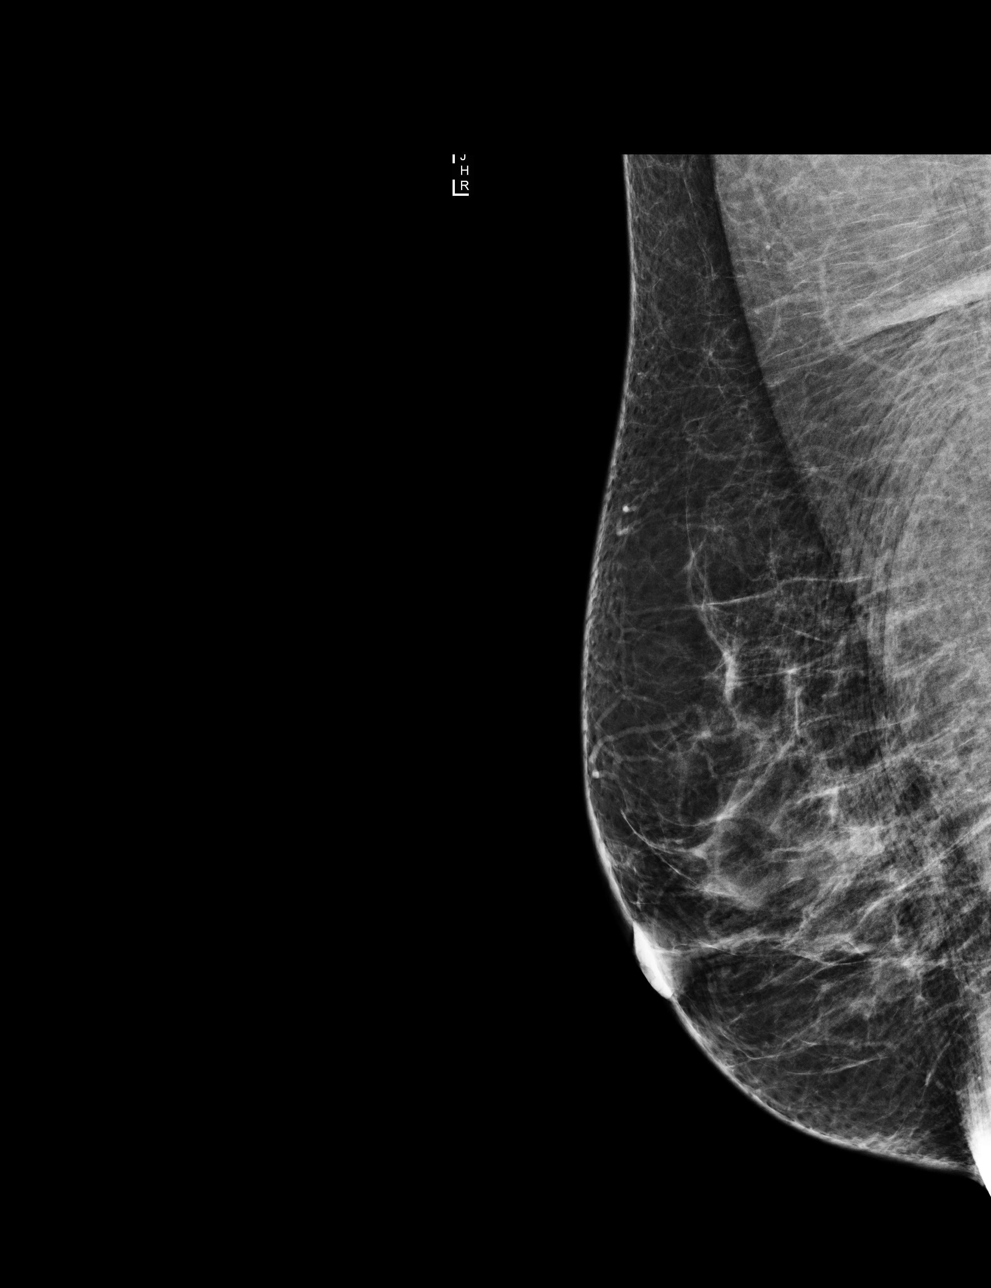

[R CC]
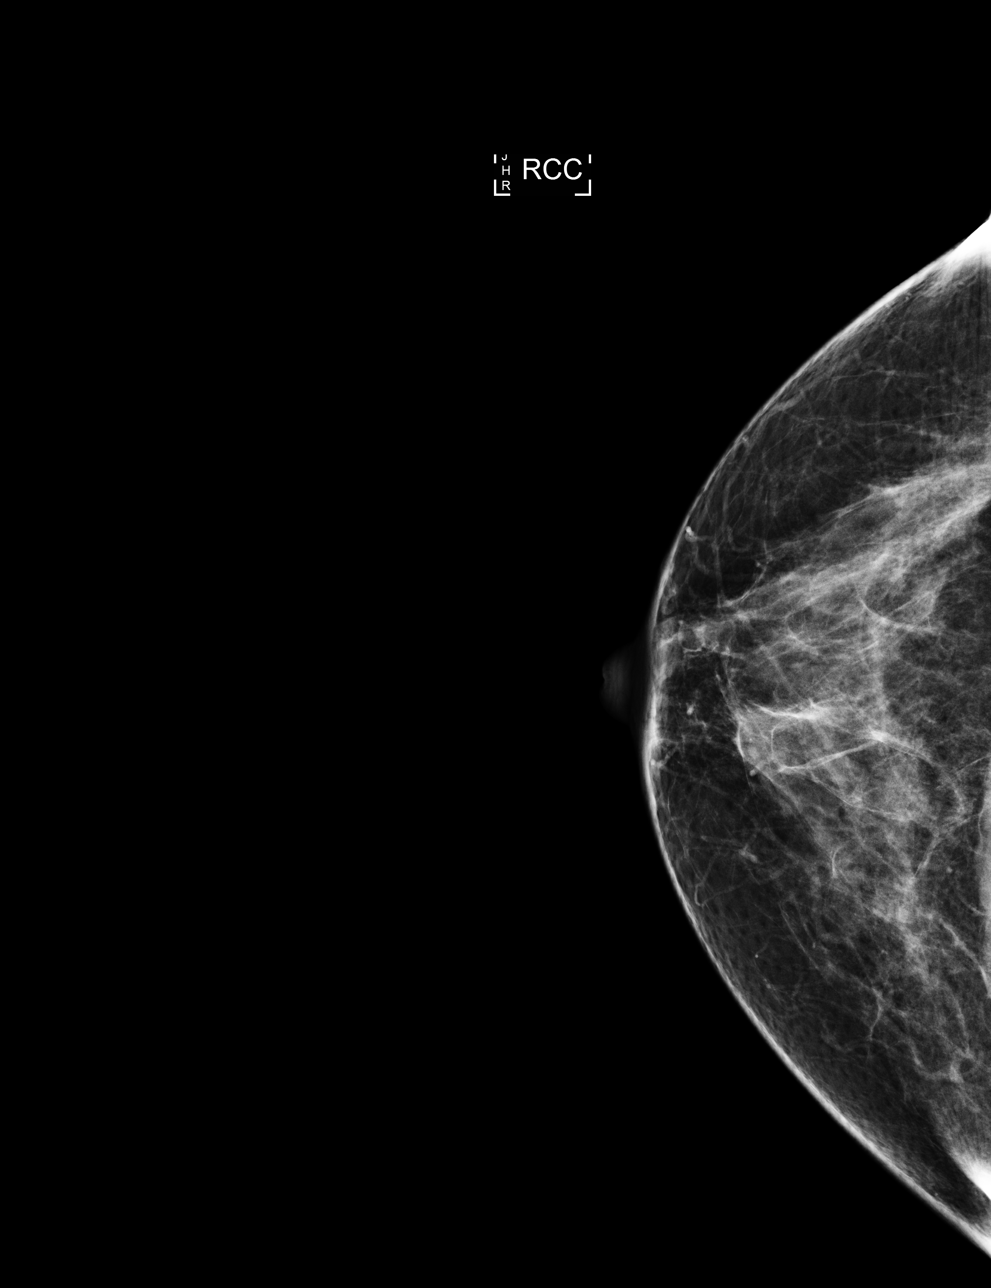

[4 of 4 positions shown; findings below may reference images not displayed]

ACR Breast Density Category c: The breast tissue is heterogeneously
dense, which may obscure small masses.
FINDINGS: There are no findings suspicious for malignancy. Images were
processed with CAD.
IMPRESSION: No mammographic evidence of malignancy. A result letter of this
screening mammogram will be mailed directly to the patient.

RECOMMENDATION:
Screening mammogram in one year. (Code:[id number])

BI-RADS CATEGORY  1: Negative.

## 2018-11-23 ENCOUNTER — Encounter: Payer: Self-pay | Admitting: Gastroenterology

## 2018-12-05 ENCOUNTER — Encounter: Payer: Self-pay | Admitting: Family Medicine

## 2018-12-05 ENCOUNTER — Other Ambulatory Visit: Payer: Self-pay

## 2018-12-05 ENCOUNTER — Ambulatory Visit: Payer: 59 | Admitting: Family Medicine

## 2018-12-05 VITALS — BP 130/82 | HR 53

## 2018-12-05 DIAGNOSIS — R5383 Other fatigue: Secondary | ICD-10-CM | POA: Diagnosis not present

## 2018-12-05 DIAGNOSIS — Z23 Encounter for immunization: Secondary | ICD-10-CM

## 2018-12-05 DIAGNOSIS — Z0000 Encounter for general adult medical examination without abnormal findings: Secondary | ICD-10-CM

## 2018-12-05 NOTE — Patient Instructions (Signed)
It was great to see you today! Thank you for letting me participate in your care!  Today, we discussed your ratigue and we did some routine bloodwork. If anything is abnormal I will call you.  Please return to the clinic in a month so I can follow up on your knee pain and your anxiety.  Be well, Harolyn Rutherford, DO PGY-3, [facility]

## 2018-12-05 NOTE — Progress Notes (Signed)
     Subjective: Chief Complaint  Patient presents with  . Annual Exam   HPI: Anna Berry is a 58 y.o. presenting to clinic today to discuss the following:  Fatigue Anna Berry presents to clinic today for an annual exam and fatigue. She has long standing symptoms of non-specific fatigue and "low energy". This has recently gotten worse in light of having not to work due to COVID-19 and total knee surgery. She has no other complaints today. She has no weight gain, temperature tolerance changes, constipation, or other symptoms.  Health Maintenance: influenza vaccine, lipid panel     ROS noted in HPI.    Social History   Tobacco Use  Smoking Status Never Smoker  Smokeless Tobacco Never Used    Objective: BP 130/82   Pulse (!) 53   SpO2 97%  Vitals and nursing notes reviewed  Physical Exam Gen: Alert and Oriented x 3, NAD HEENT: Normocephalic, atraumatic, PERRLA, EOMI Neck: trachea midline, no thyroidmegaly, no LAD CV: RRR, no murmurs, normal S1, S2 split Resp: CTAB, no wheezing, rales, or rhonchi, comfortable work of breathing Ext: no clubbing, cyanosis, or edema Skin: warm, dry, intact, no rashes  No results found for this or any previous visit (from the past 72 hour(s)).  Assessment/Plan:  Fatigue Long standing without any significant changes. 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 Recent CBC with slight normocytic anemia. Unlikely cause.  - Will check TSH and free T4. - Continue rehab exercises, recommend 8 hours of sleep per night, and a well balanced diet   4 years since las lipid panel so will check today.  PATIENT EDUCATION PROVIDED: See AVS    Diagnosis and plan along with any newly prescribed medication(s) were discussed in detail with this patient today. The patient verbalized understanding and agreed  with the plan. Patient advised if symptoms worsen return to clinic or ER.   Health Maintainance: influenza vaccine, lipid panel   Orders Placed This Encounter  Procedures  . Flu Vaccine QUAD 36+ mos IM  . TSH + free T4  . Lipid Panel    No orders of the defined types were placed in this encounter.    Harolyn Rutherford, DO 12/05/2018, 8:26 AM PGY-3 [place]

## 2018-12-05 NOTE — Assessment & Plan Note (Signed)
Long standing without any significant changes. 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 Recent CBC with slight normocytic anemia. Unlikely cause.  - Will check TSH and free T4. - Continue rehab exercises, recommend 8 hours of sleep per night, and a well balanced diet

## 2018-12-06 LAB — TSH+FREE T4
Free T4: 1.11 ng/dL (ref 0.82–1.77)
TSH: 1.03 u[IU]/mL (ref 0.450–4.500)

## 2018-12-12 ENCOUNTER — Other Ambulatory Visit: Payer: Self-pay | Admitting: *Deleted

## 2018-12-12 DIAGNOSIS — Z20822 Contact with and (suspected) exposure to covid-19: Secondary | ICD-10-CM

## 2018-12-13 LAB — NOVEL CORONAVIRUS, NAA: SARS-CoV-2, NAA: NOT DETECTED

## 2019-01-26 ENCOUNTER — Other Ambulatory Visit: Payer: Self-pay

## 2019-01-26 ENCOUNTER — Ambulatory Visit (INDEPENDENT_AMBULATORY_CARE_PROVIDER_SITE_OTHER): Payer: Self-pay

## 2019-01-26 ENCOUNTER — Ambulatory Visit (INDEPENDENT_AMBULATORY_CARE_PROVIDER_SITE_OTHER): Payer: Self-pay | Admitting: Orthopaedic Surgery

## 2019-01-26 ENCOUNTER — Encounter: Payer: Self-pay | Admitting: Orthopaedic Surgery

## 2019-01-26 VITALS — Ht 66.0 in | Wt 189.0 lb

## 2019-01-26 DIAGNOSIS — Z96651 Presence of right artificial knee joint: Secondary | ICD-10-CM

## 2019-01-26 NOTE — Progress Notes (Signed)
Post-Op Visit Note   Patient: Anna Berry           Date of Birth: 1960/09/12           MRN: [id number] Visit Date: 01/26/2019 PCP: Nuala Alpha, DO   Assessment & Plan:  Chief Complaint:  Chief Complaint  Patient presents with  . Right Knee - Follow-up    Right TKA DOS 07/29/2018   Visit Diagnoses:  1. History of total knee replacement, right     Plan: 6 month TKA follow up plan  Patient now 6 months status post uncemented left total knee arthroplasty.  Unfortunately she has been laid off from her job at [employer] due to [employer].  Scar is healed with no signs of complications or infection.  The patient does not complain of pain, and is back to normal daily activities.  She does endorse some soreness with increased activity and numbness lateral to the surgical scar.  Overall she is happy with the outcome.  She continues to do home exercises.  It was reinforced that prophylactic antibiotics should be taken with any procedure including but not limited to dental work or colonoscopies.  We will plan on following up at the 12 month postop visit with 2 view xrays of the operative knee at that time. As always, instructions were given to call with any questions or concerns in the interim.   Follow-Up Instructions: Return in about 6 months (around 07/26/2019).   Orders:  Orders Placed This Encounter  Procedures  . XR Knee 1-2 Views Right   No orders of the defined types were placed in this encounter.   Imaging: Xr Knee 1-2 Views Right  Result Date: 01/26/2019 Stable total knee replacement in good alignment without evidence of loosening   PMFS History: Patient Active Problem List   Diagnosis Date Noted  . History of total knee replacement, 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  . Metatarsalgia of both feet 01/18/2018  . Right arm weakness 06/18/2017  . Primary osteoarthritis of left knee 12/18/2016  . 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 Obesity (BMI 30-39.9) 05/11/2012  . AC  (acromioclavicular) joint arthritis 07/23/2011  . Right knee pain 06/22/2011  . Fatigue 11/26/2010  . Depression 11/26/2010  . 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 Asthma 12/29/2009  . POSTMENOPAUSAL SYNDROME 08/26/2009  . Osteoarthritis of right knee 08/26/2009  . HEMATURIA UNSPECIFIED 05/21/2008  . ALLERGIC RHINITIS 05/29/2007  . SICKLE CELL TRAIT 05/13/2006   Past Medical History:  Diagnosis Date  . Allergy   . Anemia   . Arthritis   . Asthma    allergies induced  . Bilateral lower extremity edema   . Depression   . Fibroids 08/2002  . Gross hematuria 05/2010  . History of appendicitis   . History of colon polyps 2017  . Multiple renal cysts 05/24/2008   Two left renal parapelvic cysts.  . OA (osteoarthritis) of knee    Right  . Obesity   . Plantar fasciitis   . Postmenopausal   . Pre-diabetes   . 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 Seasonal allergies   . Sickle cell anemia ([facility])    sickle cell trait  . Sickle cell trait ([place])   . Sigmoid diverticulosis 09/26/2015   Noted on colonoscopy    Family History  Problem Relation Age of Onset  . Stomach cancer Maternal Grandmother 78  . Stroke Maternal Grandmother   . Diabetes Father   . Kidney disease Father   .  Colon cancer Other   . Colon polyps Neg Hx   . Esophageal cancer Neg Hx   . Rectal cancer Neg Hx     Past Surgical History:  Procedure Laterality Date  . ABDOMINAL HYSTERECTOMY  2004  . APPENDECTOMY  2006  . [place]   x2  . COLONOSCOPY    . COLONOSCOPY W/ POLYPECTOMY  09/26/2015  . POLYPECTOMY    . TOTAL KNEE ARTHROPLASTY Right 07/29/2018   Procedure: RIGHT TOTAL KNEE ARTHROPLASTY;  Surgeon: Leandrew Koyanagi, MD;  Location: [facility];  Service: Orthopedics;  Laterality: Right;   Social History   Occupational History  . Not on file  Tobacco Use  . Smoking status: Never Smoker  . Smokeless tobacco: Never Used  Substance and Sexual Activity  . Alcohol use: No     Alcohol/week: 0.0 standard drinks  . Drug use: No  . Sexual activity: Yes    Birth control/protection: Surgical

## 2019-03-08 ENCOUNTER — Telehealth: Payer: Self-pay

## 2019-03-08 NOTE — Telephone Encounter (Signed)
Pt calling nurse line regarding neck pain that she has been experiencing over the past two weeks. Patient believed the pain started after having pulled a muscle. Pt reports that she feels a few knots in her neck. No redness or swelling at this time. Advised patient to try a heating pad for discomfort and to go to urgent care or ED if symptoms worsen over the weekend.  Talbot Grumbling, RN

## 2019-03-14 ENCOUNTER — Ambulatory Visit: Payer: Self-pay | Admitting: Family Medicine

## 2019-05-12 ENCOUNTER — Ambulatory Visit: Payer: Self-pay | Attending: Internal Medicine

## 2019-05-12 DIAGNOSIS — Z23 Encounter for immunization: Secondary | ICD-10-CM | POA: Insufficient documentation

## 2019-05-12 NOTE — Progress Notes (Signed)
   Covid-19 Vaccination Clinic  Name:  Anna Berry    MRN: [id number] DOB: 11-22-1960  05/12/2019  [name] was observed post Covid-19 immunization for 15 minutes without incidence. She was provided with Vaccine Information Sheet and instruction to access the V-Safe system.   [name] was instructed to call 911 with any severe reactions post vaccine: Marland Kitchen Difficulty breathing  . Swelling of your face and throat  . A fast heartbeat  . A bad rash all over your body  . Dizziness and weakness    Immunizations Administered    Name Date Dose VIS Date Route   Pfizer COVID-19 Vaccine 05/12/2019 12:39 PM 0.3 mL 02/24/2019 Intramuscular   Manufacturer: [place]   Lot: [id number]   [place]: [id number]

## 2019-06-07 ENCOUNTER — Ambulatory Visit: Payer: Self-pay | Attending: Internal Medicine

## 2019-06-07 DIAGNOSIS — Z23 Encounter for immunization: Secondary | ICD-10-CM

## 2019-06-07 NOTE — Progress Notes (Signed)
   Covid-19 Vaccination Clinic  Name:  Anna Berry    MRN: [id number] DOB: 02-18-1961  06/07/2019  Anna Berry was observed post Covid-19 immunization for 30 minutes based on pre-vaccination screening without incident. She was provided with Vaccine Information Sheet and instruction to access the V-Safe system.   Anna Berry was instructed to call 911 with any severe reactions post vaccine: Marland Kitchen Difficulty breathing  . Swelling of face and throat  . A fast heartbeat  . A bad rash all over body  . Dizziness and weakness   Immunizations Administered    Name Date Dose VIS Date Route   Pfizer COVID-19 Vaccine 06/07/2019 12:35 PM 0.3 mL 02/24/2019 Intramuscular   Manufacturer: [place]   Lot: [id number]   [place]: [id number]

## 2019-06-28 NOTE — Progress Notes (Signed)
SUBJECTIVE:   CHIEF COMPLAINT / HPI:    Stiff neck for months: Patient reports several months of neck stiffness that is worse at night and in the morning.  It starts to resolve as she starts to move her neck around.  The pain, which she describes as a soreness, will sometimes radiate into her right shoulder.  For her pain she might take Tylenol once weekly, but tries not to take this as it sometimes makes her have worse bleeding.  Warm compress to sometimes make the neck feel a little bit better.  Soreness/stiffness is located on the right side of the neck, sleeping on her left side sometimes makes the pain better.  She denies headaches, vision changes, chest pain, shortness of breath, nausea, vomiting, rashes, and fevers.  Also denies any loss of strength.  Bump on tongue: Patient states for the last few days she has had a bump on her tongue.  It started off as being bright red, swelled and got bigger, then overnight it seemed to burst and turn dark.  Now today, she reports it is red and slightly smaller than the original size.  She has not found any regimens that make the bump feel better.  Touching it is aggravating, the bump is also aggravating on the roof of her mouth.  Denies history of smoking, drinking.  Is sexually active with one partner (husband).    Concern for UTI: Patient reports that this morning she started having blood in her urine.  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  Additionally, she is having discomfort with urination, urinary frequency, and urinary urgency.  Will sometimes go to the bathroom to try to urinate but nothing comes out.  Also experiencing mild lower abdominal pain.  Denies back pains, fevers, body aches, nausea, vomiting, or rashes.  PERTINENT  PMH / PSH:  Hx of to R knee s/p Total Knee  Replacement AC Joint Arthritis Primary Osteoarthritis Left Knee Hx BMI 33   OBJECTIVE:   BP (!) 148/82   Pulse 95   Wt 206 lb (93.4 kg)   SpO2 95%   BMI 33.25 kg/m    Physical Exam: General: Well-appearing, nontoxic-appearing, pleasant patient HEENT: see photo below Integumentary: No rashes appreciated MSK - NECK - No gross deformity, swelling, bruising. - TTP .  No midline/bony TTP. - FROM. - BUE strength 5/5.   - Sensation intact to light touch.   - Negative spurlings. - NV intact distal BUEs.      ASSESSMENT/PLAN:   Acute cystitis with hematuria Patient with gross hematuria, dysuria, urinary frequency, urinary urgency, and lower abdominal pain.  UA showing moderate leukocytes, negative nitrites, 2+ bacteria on microscopy.  Unfortunately urine culture was not sent. 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  Could possibly due to cervical strain (from sleep posture).  Less likely fibromyalgia, polymyalgia rheumatica.  No concern at this time for meningitis or subarachnoid hemorrhage. 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  Could consider mucocele, papillitis.  It does not resolve may need to biopsy to rule out cancerous lesion. -Patient to continue to monitor -Follow-up with PCP 07/13/2019   Daisy Floro, [place]

## 2019-06-29 ENCOUNTER — Other Ambulatory Visit: Payer: Self-pay

## 2019-06-29 ENCOUNTER — Encounter: Payer: Self-pay | Admitting: Family Medicine

## 2019-06-29 ENCOUNTER — Ambulatory Visit (INDEPENDENT_AMBULATORY_CARE_PROVIDER_SITE_OTHER): Payer: 59 | Admitting: Family Medicine

## 2019-06-29 VITALS — BP 148/82 | HR 95 | Wt 206.0 lb

## 2019-06-29 DIAGNOSIS — K148 Other diseases of tongue: Secondary | ICD-10-CM

## 2019-06-29 DIAGNOSIS — N3001 Acute cystitis with hematuria: Secondary | ICD-10-CM | POA: Diagnosis not present

## 2019-06-29 DIAGNOSIS — M436 Torticollis: Secondary | ICD-10-CM | POA: Diagnosis not present

## 2019-06-29 DIAGNOSIS — R310 Gross hematuria: Secondary | ICD-10-CM | POA: Diagnosis not present

## 2019-06-29 LAB — POCT URINALYSIS DIP (MANUAL ENTRY)
Bilirubin, UA: NEGATIVE
Glucose, UA: NEGATIVE mg/dL
Ketones, POC UA: NEGATIVE mg/dL
Nitrite, UA: NEGATIVE
Protein Ur, POC: NEGATIVE mg/dL
Spec Grav, UA: 1.02 (ref 1.010–1.025)
Urobilinogen, UA: 0.2 E.U./dL
pH, UA: 7 (ref 5.0–8.0)

## 2019-06-29 LAB — POCT UA - MICROSCOPIC ONLY

## 2019-06-29 MED ORDER — ALBUTEROL SULFATE HFA 108 (90 BASE) MCG/ACT IN AERS: 2.0000 | INHALATION_SPRAY | Freq: Four times a day (QID) | RESPIRATORY_TRACT | 3 refills | Status: DC | PRN

## 2019-06-29 NOTE — Patient Instructions (Signed)
Thank you for coming in to see [place] today! Please see below to review our plan for today's visit:  1. You are most likely experiencing arthritis in the neck and would benefit from exercising! Do neck exercises at least once daily, hold 10 seconds when indicated, each exercise 3 times. 2. We are checking your urine today and will treat as needed. 3. Please try to follow up with your kidney doctor sooner than later!   Please call the clinic at [phone] if your symptoms worsen or you have any concerns. It was our pleasure to serve you!  Dr. Milus Banister [facility]   Neck Exercises Ask your health care provider which exercises are safe for you. Do exercises exactly as told by your health care provider and adjust them as directed. It is normal to feel mild stretching, pulling, tightness, or discomfort as you do these exercises. Stop right away if you feel sudden pain or your pain gets worse. Do not begin these exercises until told by your health care provider. Neck exercises can be important for many reasons. They can improve strength and maintain flexibility in your neck, which will help your upper back and prevent neck pain. Stretching exercises Rotation neck stretching  1. Sit in a chair or stand up. 2. Place your feet flat on the floor, shoulder width apart. 3. Slowly turn your head (rotate) to the right until a slight stretch is felt. Turn it all the way to the right so you can look over your right shoulder. Do not tilt or tip your head. 4. Hold this position for 10-30 seconds. 5. Slowly turn your head (rotate) to the left until a slight stretch is felt. Turn it all the way to the left so you can look over your left shoulder. Do not tilt or tip your head. 6. Hold this position for 10-30 seconds. Repeat __________ times. Complete this exercise __________ times a day. Neck retraction 1. Sit in a sturdy chair or stand up. 2. Look straight ahead. Do not bend your neck.  3. Use your fingers to push your chin backward (retraction). Do not bend your neck for this movement. Continue to face straight ahead. If you are doing the exercise properly, you will feel a slight sensation in your throat and a stretch at the back of your neck. 4. Hold the stretch for 1-2 seconds. Repeat __________ times. Complete this exercise __________ times a day. Strengthening exercises Neck press 1. Lie on your back on a firm bed or on the floor with a pillow under your head. 2. Use your neck muscles to push your head down on the pillow and straighten your spine. 3. Hold the position as well as you can. Keep your head facing up (in a neutral position) and your chin tucked. 4. Slowly count to 5 while holding this position. Repeat __________ times. Complete this exercise __________ times a day. Isometrics These are exercises in which you strengthen the muscles in your neck while keeping your neck still (isometrics). 1. Sit in a supportive chair and place your hand on your forehead. 2. Keep your head and face facing straight ahead. Do not flex or extend your neck while doing isometrics. 3. Push forward with your head and neck while pushing back with your hand. Hold for 10 seconds. 4. Do the sequence again, this time putting your hand against the back of your head. Use your head and neck to push backward against the hand pressure. 5. Finally, do the  same exercise on either side of your head, pushing sideways against the pressure of your hand. Repeat __________ times. Complete this exercise __________ times a day. Prone head lifts 1. Lie face-down (prone position), resting on your elbows so that your chest and upper back are raised. 2. Start with your head facing downward, near your chest. Position your chin either on or near your chest. 3. Slowly lift your head upward. Lift until you are looking straight ahead. Then continue lifting your head as far back as you can comfortably stretch. 4.  Hold your head up for 5 seconds. Then slowly lower it to your starting position. Repeat __________ times. Complete this exercise __________ times a day. Supine head lifts 1. Lie on your back (supine position), bending your knees to point to the ceiling and keeping your feet flat on the floor. 2. Lift your head slowly off the floor, raising your chin toward your chest. 3. Hold for 5 seconds. Repeat __________ times. Complete this exercise __________ times a day. Scapular retraction 1. Stand with your arms at your sides. Look straight ahead. 2. Slowly pull both shoulders (scapulae) backward and downward (retraction) until you feel a stretch between your shoulder blades in your upper back. 3. Hold for 10-30 seconds. 4. Relax and repeat. Repeat __________ times. Complete this exercise __________ times a day. Contact a health care provider if:  Your neck pain or discomfort gets much worse when you do an exercise.  Your neck pain or discomfort does not improve within 2 hours after you exercise. If you have any of these problems, stop exercising right away. Do not do the exercises again unless your health care provider says that you can. Get help right away if:  You develop sudden, severe neck pain. If this happens, stop exercising right away. Do not do the exercises again unless your health care provider says that you can. This information is not intended to replace advice given to you by your health care provider. Make sure you discuss any questions you have with your health care provider. Document Revised: 12/29/2017 Document Reviewed: 12/29/2017 Elsevier Patient Education  [place].

## 2019-06-30 ENCOUNTER — Other Ambulatory Visit: Payer: Self-pay | Admitting: Family Medicine

## 2019-06-30 DIAGNOSIS — N3001 Acute cystitis with hematuria: Secondary | ICD-10-CM

## 2019-06-30 DIAGNOSIS — K148 Other diseases of tongue: Secondary | ICD-10-CM | POA: Insufficient documentation

## 2019-06-30 DIAGNOSIS — M436 Torticollis: Secondary | ICD-10-CM | POA: Insufficient documentation

## 2019-06-30 MED ORDER — NITROFURANTOIN MONOHYD MACRO 100 MG PO CAPS: 100.0000 mg | ORAL_CAPSULE | Freq: Two times a day (BID) | ORAL | 0 refills | Status: AC

## 2019-06-30 NOTE — Progress Notes (Signed)
Called patient about results. UA showing moderate Leuk, no nitrite. Microscopy showing 2+ Bacteria. Unfortunately did not order Culture. Patient having lower abd pain, urinary frequency, urgency and dysuria. Will treat with Macrobid x 7 days, discussed with preceptor Dr. Marice Potter. Patient to follow up with PCP April 29th.  Milus Banister, [place], PGY-2 06/30/2019 5:24 PM

## 2019-06-30 NOTE — Assessment & Plan Note (Signed)
Patient with gross hematuria, dysuria, urinary frequency, urinary urgency, and lower abdominal pain.  UA showing moderate leukocytes, negative nitrites, 2+ bacteria on microscopy.  Unfortunately urine culture was not sent. -Treat with Macrobid 100 mg twice daily x7 days -Patient to follow-up with PCP on 07/13/2019

## 2019-06-30 NOTE — Assessment & Plan Note (Signed)
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  Could possibly due to cervical strain (from sleep posture).  Less likely fibromyalgia, polymyalgia rheumatica.  No concern at this time for meningitis or subarachnoid hemorrhage. 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

## 2019-06-30 NOTE — Assessment & Plan Note (Signed)
Tongue lesion appreciated 5 days ago that has slightly changed in size.  Could consider mucocele, papillitis.  It does not resolve may need to biopsy to rule out cancerous lesion. -Patient to continue to monitor -Follow-up with PCP 07/13/2019

## 2019-07-13 ENCOUNTER — Other Ambulatory Visit: Payer: Self-pay

## 2019-07-13 ENCOUNTER — Encounter: Payer: Self-pay | Admitting: Family Medicine

## 2019-07-13 ENCOUNTER — Ambulatory Visit (INDEPENDENT_AMBULATORY_CARE_PROVIDER_SITE_OTHER): Payer: 59 | Admitting: Family Medicine

## 2019-07-13 VITALS — BP 130/88 | HR 93 | Ht 66.0 in | Wt 207.0 lb

## 2019-07-13 DIAGNOSIS — R600 Localized edema: Secondary | ICD-10-CM | POA: Diagnosis not present

## 2019-07-13 DIAGNOSIS — K148 Other diseases of tongue: Secondary | ICD-10-CM | POA: Diagnosis not present

## 2019-07-13 DIAGNOSIS — N3001 Acute cystitis with hematuria: Secondary | ICD-10-CM | POA: Diagnosis not present

## 2019-07-13 LAB — POCT URINALYSIS DIP (MANUAL ENTRY)
Bilirubin, UA: NEGATIVE
Blood, UA: NEGATIVE
Glucose, UA: NEGATIVE mg/dL
Ketones, POC UA: NEGATIVE mg/dL
Leukocytes, UA: NEGATIVE
Nitrite, UA: NEGATIVE
Protein Ur, POC: NEGATIVE mg/dL
Spec Grav, UA: 1.015 (ref 1.010–1.025)
Urobilinogen, UA: 0.2 E.U./dL
pH, UA: 7 (ref 5.0–8.0)

## 2019-07-13 LAB — POCT GLYCOSYLATED HEMOGLOBIN (HGB A1C): Hemoglobin A1C: 5.9 % — AB (ref 4.0–5.6)

## 2019-07-13 NOTE — Patient Instructions (Signed)
It was great to see you today! Thank you for letting me participate in your care!  Today, we discussed your leg swelling which is most likely due to venous insufficiency where your veins just don't do a good a job of getting blood back to your heart as they used to do. Please continue elevating your feet at rest and over night. Wear your compression stockings during the day. I am obtaining some blood work to rule out other causes.  For you UTI I have repeated urine studies to ensure it is better.  I will refer you to a specialist for the bump on your tongue.  Be well, Harolyn Rutherford, DO PGY-3, [facility]

## 2019-07-13 NOTE — Progress Notes (Signed)
    SUBJECTIVE:   CHIEF COMPLAINT / HPI:   UTI with hematuria Patient was seen 2 weeks ago for concern for UTI. She was started on an antibiotic. During her workup she was found to have hematuria. She presents today for follow up saying she has finished her antibiotic and has resolution of her symptoms. She did states during her UTI she noticed blood in her urine and says that happens sometimes when she gets a UTI. She has no dysuria or lower abdominal pain today, no frequency, no urgency.  Tongue Lesion She states this bump appeared about one month ago suddenly. It swelled up, filled with fluid, was tender and then popped. Now, a red bump persists on her tongue. It is no longer tender but she is unsure what it is and doesn't know what to do about it. It doesn't bother her but she is wondering if it will ever go away. She states it appeared a few days after her second COVID shot.  Bilateral Leg swelling Acute on chronic. Worse today than usual. She has no pain in her legs, no pain during walking, not having to stop and rest. She reports no skin changes, just sometimes they swell up. She has compression stockings but has not been wearing them.   PERTINENT  PMH / PSH: Hx of UTI and nephrolithasis  OBJECTIVE:   BP 130/88   Pulse 93   Ht 5\' 6"  (1.676 m)   Wt 207 lb (93.9 kg)   SpO2 95%   BMI 33.41 kg/m   Gen: Alert and Oriented x 3, NAD HEENT: Normocephalic, atraumatic, PERRLA, EOMI, non-swollen, non-erythematous turbinates, non-erythematous pharyngeal mucosa, no exudates    Neck: no LAD CV: RRR, no murmurs, normal S1, S2 split Resp: CTAB, no wheezing, rales, or rhonchi, comfortable work of breathing Ext: bilaterally +2 pitting edema starting mid tibia to feet Skin:  no rashes   ASSESSMENT/PLAN:   Acute cystitis with hematuria Resolution of symptoms with antibiotics.  - Repeat U/A today was normal, microscopy pending and UCx pending  Tongue lesion Unclear etiology but given  rapid appearance I find it unlikely to be cancerous in origin. However, it has persisted for one month. - If lesion continues to get bigger or persists for another few weeks patient will call back and I will consider ENT referral. I will also precept this to see what next steps I may need to take.  Bilateral lower extremity edema Most likely from venous insufficiency. No pain making DVT unlikely and no warmth or skin color changes making cellulitis unlikely. No pain or cramping when walking making PAD less likely. - Compression stocking during the day, elevate feet at night, decrease salt intake - F/u if worsens - Will check BMP and BNP today to rule out possible kidney disease that could contribue and BNP will help ensure there is no cardio component.     Nuala Alpha, [place]

## 2019-07-14 ENCOUNTER — Other Ambulatory Visit: Payer: Self-pay | Admitting: Family Medicine

## 2019-07-14 DIAGNOSIS — K148 Other diseases of tongue: Secondary | ICD-10-CM

## 2019-07-14 DIAGNOSIS — R600 Localized edema: Secondary | ICD-10-CM | POA: Insufficient documentation

## 2019-07-14 LAB — CBC WITH DIFFERENTIAL/PLATELET
Basophils Absolute: 0.1 10*3/uL (ref 0.0–0.2)
Basos: 1 %
EOS (ABSOLUTE): 0.5 10*3/uL — ABNORMAL HIGH (ref 0.0–0.4)
Eos: 9 %
Hematocrit: 39.5 % (ref 34.0–46.6)
Hemoglobin: 13.1 g/dL (ref 11.1–15.9)
Immature Grans (Abs): 0 10*3/uL (ref 0.0–0.1)
Immature Granulocytes: 0 %
Lymphocytes Absolute: 1.7 10*3/uL (ref 0.7–3.1)
Lymphs: 29 %
MCH: 28.9 pg (ref 26.6–33.0)
MCHC: 33.2 g/dL (ref 31.5–35.7)
MCV: 87 fL (ref 79–97)
Monocytes Absolute: 0.5 10*3/uL (ref 0.1–0.9)
Monocytes: 8 %
Neutrophils Absolute: 3 10*3/uL (ref 1.4–7.0)
Neutrophils: 53 %
Platelets: 229 10*3/uL (ref 150–450)
RBC: 4.53 x10E6/uL (ref 3.77–5.28)
RDW: 15 % (ref 11.7–15.4)
WBC: 5.6 10*3/uL (ref 3.4–10.8)

## 2019-07-14 LAB — BASIC METABOLIC PANEL
BUN/Creatinine Ratio: 9 — ABNORMAL LOW (ref 12–28)
BUN: 9 mg/dL (ref 8–27)
CO2: 24 mmol/L (ref 20–29)
Calcium: 9.2 mg/dL (ref 8.7–10.3)
Chloride: 105 mmol/L (ref 96–106)
Creatinine, Ser: 1 mg/dL (ref 0.57–1.00)
GFR calc Af Amer: 71 mL/min/{1.73_m2} (ref >59)
GFR calc non Af Amer: 61 mL/min/{1.73_m2} (ref >59)
Glucose: 94 mg/dL (ref 65–99)
Potassium: 4.2 mmol/L (ref 3.5–5.2)
Sodium: 142 mmol/L (ref 134–144)

## 2019-07-14 LAB — BRAIN NATRIURETIC PEPTIDE: BNP: 9.3 pg/mL (ref 0.0–100.0)

## 2019-07-14 NOTE — Progress Notes (Signed)
Ambulatory referral to oral surgery for evaluation of persistent tongue lesion. May require biopsy and removal.

## 2019-07-14 NOTE — Assessment & Plan Note (Signed)
Resolution of symptoms with antibiotics.  - Repeat U/A today was normal, microscopy pending and UCx pending

## 2019-07-14 NOTE — Assessment & Plan Note (Signed)
Unclear etiology but given rapid appearance I find it unlikely to be cancerous in origin. However, it has persisted for one month. - If lesion continues to get bigger or persists for another few weeks patient will call back and I will consider ENT referral. I will also precept this to see what next steps I may need to take.

## 2019-07-14 NOTE — Assessment & Plan Note (Signed)
Most likely from venous insufficiency. No pain making DVT unlikely and no warmth or skin color changes making cellulitis unlikely. No pain or cramping when walking making PAD less likely. - Compression stocking during the day, elevate feet at night, decrease salt intake - F/u if worsens - Will check BMP and BNP today to rule out possible kidney disease that could contribue and BNP will help ensure there is no cardio component.

## 2019-07-19 ENCOUNTER — Telehealth: Payer: Self-pay

## 2019-07-19 LAB — URINE CULTURE

## 2019-07-19 NOTE — Telephone Encounter (Signed)
Attempted to call patient.  There was no answer.  LVM for patient to call office.  Ozella Almond, [place]

## 2019-07-19 NOTE — Telephone Encounter (Signed)
-----   Message from Nuala Alpha, DO sent at 07/19/2019  1:37 PM EDT ----- Regarding: UTI symptoms Hey team! I've tried reaching out to her to see if her UTI symptoms are still gone. Her urine culture was positive for E. Coli. I left her a voicemail but haven't heard anything back from her. Can we try reaching back out to her to see if she is still without symptoms? If she does have symptoms I am going to send her in a different antibiotic. Thanks!  Tim

## 2019-07-20 ENCOUNTER — Encounter: Payer: Self-pay | Admitting: Family Medicine

## 2019-07-20 ENCOUNTER — Telehealth: Payer: Self-pay | Admitting: Family Medicine

## 2019-07-20 NOTE — Progress Notes (Signed)
Tried reaching out to patient multiple times via phone but no answer. I did leave a VM. [place] red team will also make attempts to reach patient to inquire how she is feeling and if she is still having UTI symptoms. Letter sent with results.

## 2019-07-20 NOTE — Telephone Encounter (Signed)
Attempted to reach back out to patient to see if she had symptoms given her urine culture still grew >100k colonies of E. Coli. She was not having any UTI symptoms after treatment at our last appointment. I did leave voicemail and left a note with my team to continue and try to reach out to her. I will send a letter with her results to her.

## 2019-07-20 NOTE — Telephone Encounter (Signed)
Patient returns PCP call to nurse line. Patient states she is still having intermittent UTI symptoms. Patient reports her sxs are unchanged from her last office visit. Patient would like to move forward with another antibiotic. Will forward back to PCP.

## 2019-07-21 ENCOUNTER — Other Ambulatory Visit: Payer: Self-pay | Admitting: Family Medicine

## 2019-07-21 MED ORDER — NITROFURANTOIN MONOHYD MACRO 100 MG PO CAPS: 100.0000 mg | ORAL_CAPSULE | Freq: Two times a day (BID) | ORAL | 0 refills | Status: AC

## 2019-07-21 NOTE — Telephone Encounter (Signed)
Patient contacted and informed of new antibiotic to pharmacy.

## 2019-07-21 NOTE — Progress Notes (Signed)
I did hear back from patient via message she left on our nurse line. She states her symptoms have returned. Will treat with Macrobid given she still has >100k E. Coli colonies and cultures showed it was sensitive to Macrobid.

## 2019-07-26 ENCOUNTER — Other Ambulatory Visit: Payer: Self-pay

## 2019-07-26 ENCOUNTER — Ambulatory Visit (INDEPENDENT_AMBULATORY_CARE_PROVIDER_SITE_OTHER): Payer: 59

## 2019-07-26 ENCOUNTER — Encounter: Payer: Self-pay | Admitting: Orthopaedic Surgery

## 2019-07-26 ENCOUNTER — Ambulatory Visit (INDEPENDENT_AMBULATORY_CARE_PROVIDER_SITE_OTHER): Payer: 59 | Admitting: Orthopaedic Surgery

## 2019-07-26 DIAGNOSIS — Z96651 Presence of right artificial knee joint: Secondary | ICD-10-CM

## 2019-07-26 NOTE — Progress Notes (Signed)
Office Visit Note   Patient: Anna Berry           Date of Birth: 06/13/1960           MRN: [id number] Visit Date: 07/26/2019              Requested by: Nuala Alpha, DO [address] [place],  [place] [address] PCP: Nuala Alpha, DO   Assessment & Plan: Visit Diagnoses:  1. History of total knee replacement, right     Plan: Verenise has done very well from her knee replacement.  She is very happy overall.  Dental prophylaxis was reinforced today.  She has resumed her normal activities.  We will recheck in a year with two-view x-rays of the right knee.  Follow-Up Instructions: Return in about 1 year (around 07/25/2020).   Orders:  Orders Placed This Encounter  Procedures  . XR KNEE 3 VIEW RIGHT   No orders of the defined types were placed in this encounter.     Procedures: No procedures performed   Clinical Data: No additional findings.   Subjective: Chief Complaint  Patient presents with  . Right Knee - Pain, Follow-up    Anna Berry is 1 year status post right total knee replacement.  She is doing well reports no pain.  Occasionally she has some numbness and tingling.  She does some home exercises.  Overall she is happy with her outcome.   Review of Systems   Objective: Vital Signs: There were no vitals taken for this visit.  Physical Exam  Ortho Exam Right knee shows a fully healed surgical scar with excellent range of motion. Specialty Comments:  No specialty comments available.  Imaging: XR KNEE 3 VIEW RIGHT  Result Date: 07/26/2019 Stable total knee replacement without complication.    PMFS History: Patient Active Problem List   Diagnosis Date Noted  . Bilateral lower extremity edema 07/14/2019  . Acute cystitis with hematuria 06/30/2019  . Neck stiffness 06/30/2019  . Tongue lesion 06/30/2019  . History of total knee replacement, 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  . Metatarsalgia of both feet 01/18/2018  . Right arm weakness 06/18/2017    . Primary osteoarthritis of left knee 12/18/2016  . 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 Obesity (BMI 30-39.9) 05/11/2012  . 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 Right knee pain 06/22/2011  . Fatigue 11/26/2010  . Depression 11/26/2010  . Renal papillary necrosis ([place]) 08/29/2010  . Asthma 12/29/2009  . POSTMENOPAUSAL SYNDROME 08/26/2009  . Osteoarthritis of right knee 08/26/2009  . HEMATURIA UNSPECIFIED 05/21/2008  . ALLERGIC RHINITIS 05/29/2007  . SICKLE CELL TRAIT 05/13/2006   Past Medical History:  Diagnosis Date  . Allergy   . Anemia   . Arthritis   . Asthma    allergies induced  . Bilateral lower extremity edema   . Depression   . Fibroids 08/2002  . Gross hematuria 05/2010  . History of appendicitis   . History of colon polyps 2017  . Multiple renal cysts 05/24/2008   Two left renal parapelvic cysts.  . OA (osteoarthritis) of knee    Right  . Obesity   . Plantar fasciitis   . Postmenopausal   . Pre-diabetes   . 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 Seasonal allergies   . Sickle cell anemia ([facility])    sickle cell trait  . Sickle cell trait ([place])   . Sigmoid diverticulosis 09/26/2015   Noted on colonoscopy    Family History  Problem Relation Age  of Onset  . Stomach cancer Maternal Grandmother 78  . Stroke Maternal Grandmother   . Diabetes Father   . Kidney disease Father   . Colon cancer Other   . Colon polyps Neg Hx   . Esophageal cancer Neg Hx   . Rectal cancer Neg Hx     Past Surgical History:  Procedure Laterality Date  . ABDOMINAL HYSTERECTOMY  2004  . APPENDECTOMY  2006  . [place]   x2  . COLONOSCOPY    . COLONOSCOPY W/ POLYPECTOMY  09/26/2015  . POLYPECTOMY    . TOTAL KNEE ARTHROPLASTY Right 07/29/2018   Procedure: RIGHT TOTAL KNEE ARTHROPLASTY;  Surgeon: Leandrew Koyanagi, MD;  Location: [facility];  Service: Orthopedics;  Laterality: Right;   Social History   Occupational History  . Not on file   Tobacco Use  . Smoking status: Never Smoker  . Smokeless tobacco: Never Used  Substance and Sexual Activity  . Alcohol use: No    Alcohol/week: 0.0 standard drinks  . Drug use: No  . Sexual activity: Yes    Birth control/protection: Surgical

## 2020-02-07 ENCOUNTER — Encounter: Payer: Self-pay | Admitting: Family Medicine

## 2020-02-07 ENCOUNTER — Other Ambulatory Visit: Payer: Self-pay

## 2020-02-07 ENCOUNTER — Ambulatory Visit (INDEPENDENT_AMBULATORY_CARE_PROVIDER_SITE_OTHER): Payer: 59 | Admitting: Family Medicine

## 2020-02-07 VITALS — BP 130/84 | HR 72 | Ht 66.5 in | Wt 196.0 lb

## 2020-02-07 DIAGNOSIS — E669 Obesity, unspecified: Secondary | ICD-10-CM

## 2020-02-07 DIAGNOSIS — R7303 Prediabetes: Secondary | ICD-10-CM

## 2020-02-07 DIAGNOSIS — Z0000 Encounter for general adult medical examination without abnormal findings: Secondary | ICD-10-CM | POA: Diagnosis not present

## 2020-02-07 DIAGNOSIS — Z23 Encounter for immunization: Secondary | ICD-10-CM

## 2020-02-07 DIAGNOSIS — R600 Localized edema: Secondary | ICD-10-CM

## 2020-02-07 NOTE — Assessment & Plan Note (Signed)
Likely mild venous insufficiency. Patient with trace edema on exam. Chronic and bilateral nature make DVT very unlikely. No pain, therefore inconsistent with claudication/PAD. She had BNP and BMP in April 2021 to evaluate possible CHF or renal etiology-- both of these were normal. -Continue compression stockings and leg elevation

## 2020-02-07 NOTE — Progress Notes (Signed)
° ° °  SUBJECTIVE:   CHIEF COMPLAINT / HPI:   Annual Physical Patient presents for her annual physical. Her only concern today is swelling in her lower legs, L>R. States this is chronic for her. She wears compression stockings daily, which she finds helpful. Denies lower leg pain or skin changes. No trauma or injury.   PMHx: Sickle cell trait, renal papillary necrosis, asthma, prediabetes, arthritis Meds: Albuterol inhaler (uses approx. once weekly), Claritin PSHx: right knee TKA (May 2020), total hysterectomy (many years ago), removal of tongue lesion (Sep 2021, ulcerated pyogenic granuloma)  Diet/Exercise: line dances for 3 hours 2x/week, eats at least one fruit and vegetable daily, does not care for desserts/sweets.  Health Maintenance: due for flu vaccine.  Reports she had mammogram 2 weeks ago- will try to locate these results. UTD on colonoscopy. Does not need PAP s/p hysterectomy.   OBJECTIVE:   BP 130/84    Pulse 72    Ht 5' 6.5" (1.689 m)    Wt 196 lb (88.9 kg)    SpO2 97%    BMI 31.16 kg/m   Gen: alert, well-appearing  HEENT: moist mucous membranes, no oral lesions Neck: supple CV: RRR, normal S1/S2 without m/r/g Resp: normal WOB, lungs CTAB Abd: soft, nontender, nondistended Ext: trace pitting edema in bilateral ankles, no redness or other skin changes, no increased warmth, no tenderness of lower extremities  ASSESSMENT/PLAN:   Routine physical examination No concerning findings on exam today. BMI 31, Hgb A1C 5.9%-- counseled on lifestyle modifications to achieve a healthier BMI and A1c.   Given flu vaccine today. Will attempt to locate her recent mammogram results  Return in 1 year or sooner if needed  Bilateral lower extremity edema Likely mild venous insufficiency. Patient with trace edema on exam. Chronic and bilateral nature make DVT very unlikely. No pain, therefore inconsistent with claudication/PAD. She had BNP and BMP in April 2021 to evaluate possible CHF  or renal etiology-- both of these were normal. -Continue compression stockings and leg elevation    Alcus Dad, MD [place]

## 2020-02-07 NOTE — Assessment & Plan Note (Signed)
No concerning findings on exam today. BMI 31, Hgb A1C 5.9%-- counseled on lifestyle modifications to achieve a healthier BMI and A1c.   Given flu vaccine today. Will attempt to locate her recent mammogram results  Return in 1 year or sooner if needed

## 2020-02-07 NOTE — Patient Instructions (Addendum)
It was great to meet you!  Your physical exam was normal today. For your leg swelling, continue to wear compression stocking and elevate your legs when possible.  Please try to walk in addition to your line dancing at least 1-2 times per week.   Take care and seek immediate care sooner if you develop any concerns.   Dr. [facility]

## 2020-06-02 IMAGING — DX PORTABLE RIGHT KNEE - 1-2 VIEW
2 series · 2 of 2 positions shown · non-contrast
Comparison: 04/24/2013

CLINICAL DATA: 57-year-old female with arthroplasty

EXAM:
PORTABLE RIGHT KNEE - 1-2 VIEW

[knee lat (1 of 2)]
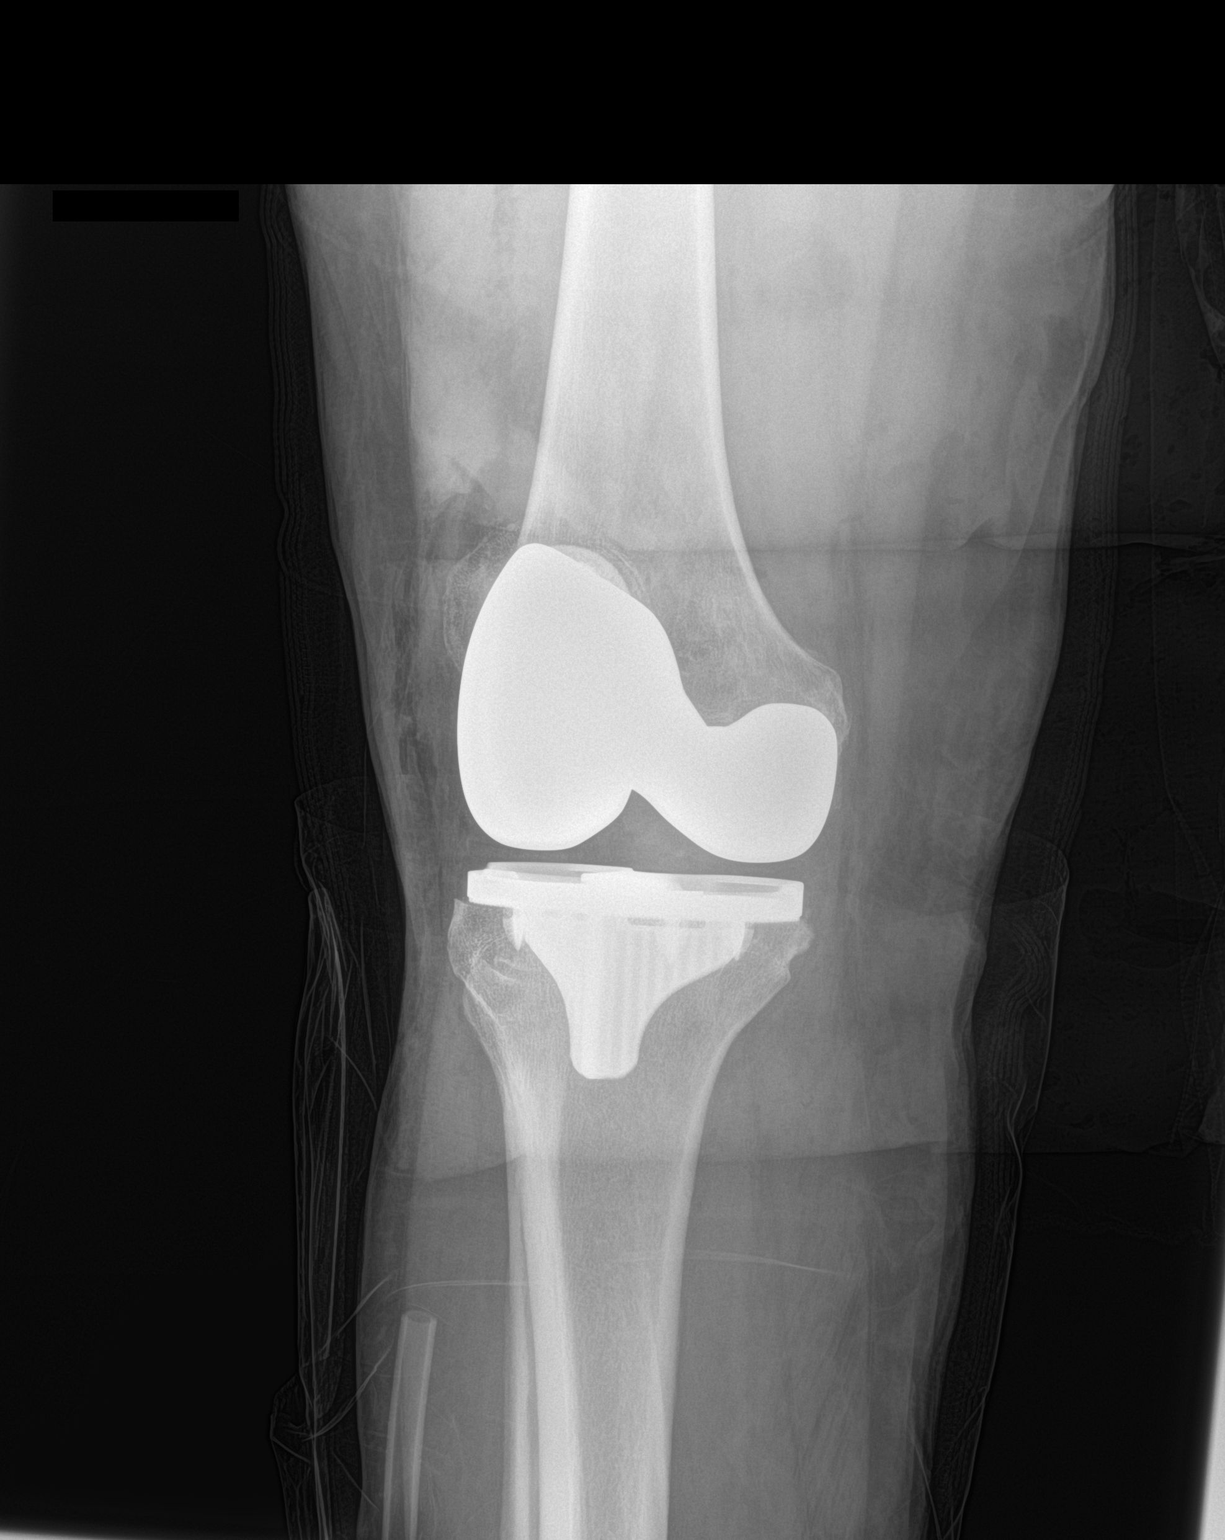

[knee lat (2 of 2)]
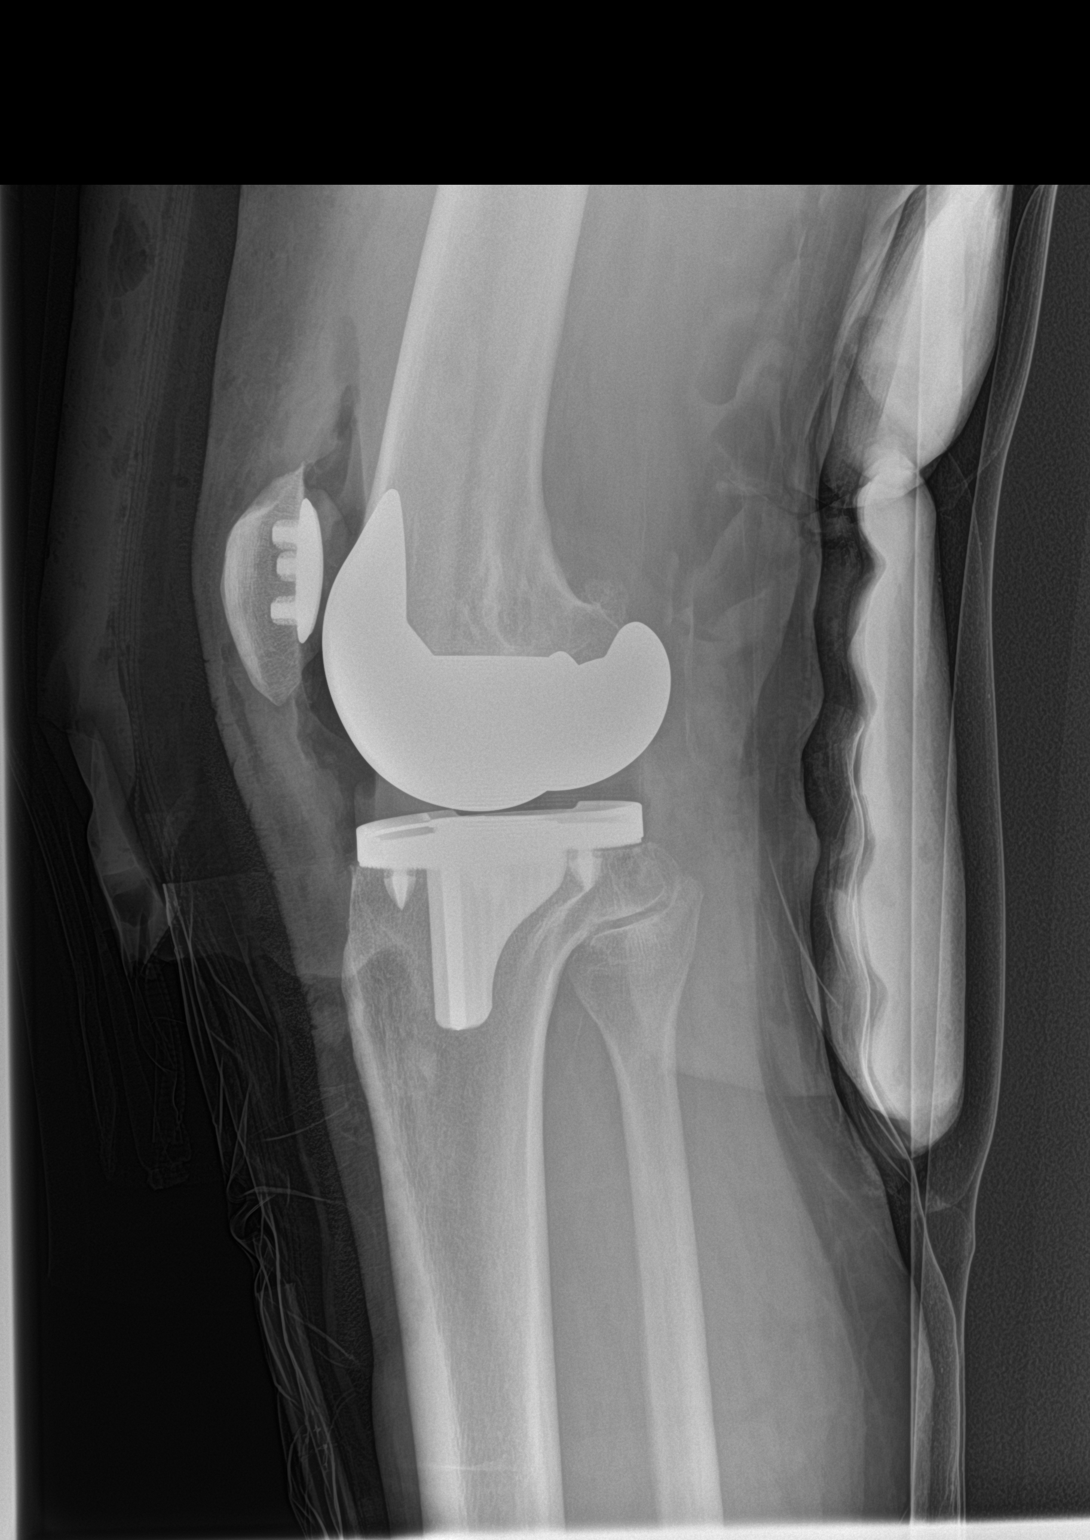

[2 of 2 positions shown; findings below may reference images not displayed]

FINDINGS: Surgical changes of right knee arthroplasty. Gas within the joint.
Soft tissue swelling. No radiopaque foreign body. Joint is aligned.
No acute fracture.
IMPRESSION: Early surgical changes of right knee arthroplasty.

## 2020-07-25 ENCOUNTER — Encounter: Payer: Self-pay | Admitting: Orthopaedic Surgery

## 2020-07-25 ENCOUNTER — Ambulatory Visit (INDEPENDENT_AMBULATORY_CARE_PROVIDER_SITE_OTHER): Payer: Self-pay | Admitting: Orthopaedic Surgery

## 2020-07-25 ENCOUNTER — Ambulatory Visit (INDEPENDENT_AMBULATORY_CARE_PROVIDER_SITE_OTHER): Payer: Self-pay

## 2020-07-25 DIAGNOSIS — Z96651 Presence of right artificial knee joint: Secondary | ICD-10-CM

## 2020-07-25 NOTE — Progress Notes (Signed)
Office Visit Note   Patient: Anna Berry           Date of Birth: 11-15-1960           MRN: [id number] Visit Date: 07/25/2020              Requested by: Nuala Alpha, DO [address],  [place] [id number] PCP: Alcus Dad, MD   Assessment & Plan: Visit Diagnoses:  1. History of total knee replacement, right     Plan: Impression is 2 years status post right total knee replacement.  She is continuing to do well.  Dental prophylaxis no longer warranted.  She will follow-up with [place] in 1 years time for repeat evaluation and 2 view x-rays of the right knee.  Call with concerns or questions.  Follow-Up Instructions: Return in about 1 year (around 07/25/2021).   Orders:  Orders Placed This Encounter  Procedures  . XR Knee 1-2 Views Right   No orders of the defined types were placed in this encounter.     Procedures: No procedures performed   Clinical Data: No additional findings.   Subjective: Chief Complaint  Patient presents with  . Right Knee - Pain    HPI patient is a very pleasant 60 year old female who comes in today 2 years out right total knee replacement.  She has been doing very well.  She has occasional swelling with increased activity but nothing more.  Overall, very pleased with her outcome.  Review of Systems as detailed in HPI.  All others reviewed and are negative.   Objective: Vital Signs: There were no vitals taken for this visit.  Physical Exam well-developed and well-nourished female in no acute distress.  Alert and oriented x3.  Ortho Exam examination of the right knee reveals a fully healed surgical scar.  Range of motion 0 to 125 degrees.  She is stable to valgus varus stress.  She is neurovascular intact distally.  Specialty Comments:  No specialty comments available.  Imaging: XR Knee 1-2 Views Right  Result Date: 07/25/2020 Showed a well-seated prosthesis without complication    PMFS History: Patient Active  Problem List   Diagnosis Date Noted  . Routine physical examination 02/07/2020  . Bilateral lower extremity edema 07/14/2019  . Acute cystitis with hematuria 06/30/2019  . Tongue lesion 06/30/2019  . History of total knee replacement, 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  . Metatarsalgia of both feet 01/18/2018  . Primary osteoarthritis of left knee 12/18/2016  . 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 Obesity (BMI 30-39.9) 05/11/2012  . 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 Right knee pain 06/22/2011  . Renal papillary necrosis ([place]) 08/29/2010  . Asthma 12/29/2009  . POSTMENOPAUSAL SYNDROME 08/26/2009  . Osteoarthritis of right knee 08/26/2009  . ALLERGIC RHINITIS 05/29/2007  . SICKLE CELL TRAIT 05/13/2006   Past Medical History:  Diagnosis Date  . Allergy   . Anemia   . Arthritis   . Asthma    allergies induced  . Bilateral lower extremity edema   . Depression   . Fibroids 08/2002  . Gross hematuria 05/2010  . History of appendicitis   . History of colon polyps 2017  . Multiple renal cysts 05/24/2008   Two left renal parapelvic cysts.  . OA (osteoarthritis) of knee    Right  . Obesity   . Plantar fasciitis   . Postmenopausal   . Pre-diabetes   . Renal papillary necrosis ([place])   . Seasonal allergies   .  Sickle cell anemia ([facility])    sickle cell trait  . Sickle cell trait ([place])   . Sigmoid diverticulosis 09/26/2015   Noted on colonoscopy    Family History  Problem Relation Age of Onset  . Stomach cancer Maternal Grandmother 78  . Stroke Maternal Grandmother   . Diabetes Father   . Kidney disease Father   . Colon cancer Other   . Colon polyps Neg Hx   . Esophageal cancer Neg Hx   . Rectal cancer Neg Hx     Past Surgical History:  Procedure Laterality Date  . ABDOMINAL HYSTERECTOMY  2004  . APPENDECTOMY  2006  . [place]   x2  . COLONOSCOPY    . COLONOSCOPY W/ POLYPECTOMY  09/26/2015  . POLYPECTOMY    . TOTAL KNEE ARTHROPLASTY  Right 07/29/2018   Procedure: RIGHT TOTAL KNEE ARTHROPLASTY;  Surgeon: Leandrew Koyanagi, MD;  Location: [facility];  Service: Orthopedics;  Laterality: Right;   Social History   Occupational History  . Not on file  Tobacco Use  . Smoking status: Never Smoker  . Smokeless tobacco: Never Used  Vaping Use  . Vaping Use: Never used  Substance and Sexual Activity  . Alcohol use: No    Alcohol/week: 0.0 standard drinks  . Drug use: No  . Sexual activity: Yes    Birth control/protection: Surgical

## 2020-09-27 ENCOUNTER — Telehealth: Payer: Self-pay

## 2020-09-27 NOTE — Telephone Encounter (Signed)
Patient calls nurse line stating she tested positive for covid today. Patient reports symptoms started on Tuesday, headache, slight fever, and congestion. Patient reports she is feeling much better. CDC recommendations given as well as conservative measures.

## 2021-02-10 ENCOUNTER — Other Ambulatory Visit: Payer: Self-pay

## 2021-02-10 ENCOUNTER — Other Ambulatory Visit: Payer: Self-pay | Admitting: Family Medicine

## 2021-02-10 ENCOUNTER — Ambulatory Visit (INDEPENDENT_AMBULATORY_CARE_PROVIDER_SITE_OTHER): Payer: Self-pay | Admitting: Family Medicine

## 2021-02-10 ENCOUNTER — Encounter: Payer: Self-pay | Admitting: Family Medicine

## 2021-02-10 ENCOUNTER — Telehealth: Payer: Self-pay

## 2021-02-10 VITALS — BP 112/77 | HR 93 | Temp 98.6°F

## 2021-02-10 DIAGNOSIS — R8290 Unspecified abnormal findings in urine: Secondary | ICD-10-CM

## 2021-02-10 DIAGNOSIS — N3001 Acute cystitis with hematuria: Secondary | ICD-10-CM

## 2021-02-10 DIAGNOSIS — J069 Acute upper respiratory infection, unspecified: Secondary | ICD-10-CM

## 2021-02-10 DIAGNOSIS — N3000 Acute cystitis without hematuria: Secondary | ICD-10-CM

## 2021-02-10 DIAGNOSIS — R052 Subacute cough: Secondary | ICD-10-CM

## 2021-02-10 DIAGNOSIS — B309 Viral conjunctivitis, unspecified: Secondary | ICD-10-CM

## 2021-02-10 DIAGNOSIS — R300 Dysuria: Secondary | ICD-10-CM

## 2021-02-10 LAB — POCT UA - MICROSCOPIC ONLY
RBC, Urine, Miroscopic: >20 (ref 0–2)
WBC, Ur, HPF, POC: >20 (ref 0–5)

## 2021-02-10 MED ORDER — NITROFURANTOIN MONOHYD MACRO 100 MG PO CAPS: 100.0000 mg | ORAL_CAPSULE | Freq: Two times a day (BID) | ORAL | 0 refills | Status: AC

## 2021-02-10 MED ORDER — BENZONATATE 100 MG PO CAPS: 100.0000 mg | ORAL_CAPSULE | Freq: Two times a day (BID) | ORAL | 0 refills | Status: DC | PRN

## 2021-02-10 NOTE — Progress Notes (Signed)
SUBJECTIVE:   CHIEF COMPLAINT / HPI:   Cough, congestion - visited 62 yo grand-baby two weeks ago in [place] - 71 year old RSV and fever - patient has now had two weeks of cough, congestion (occasionally productive), postnasal drip, sore throat - headache from coughing too much  - headache feels like it hits her head like a hammer - red eyes, painless - increased anxiety, tachycardia - some rib pain with coughing - feels as though her bladder has become weaker, some urinary leakage with cough - has been taking coricidin (husband bought)  - denies fever (some hot flashes though), sinus pain or pressure, difficulty breathing, asthma exacerbation (has not needed inhaler)  Conjunctivitis on left only - painless red eyes - uses eye drops every day - painless - lots of crusting, especially in the morning - has to clear crust multiple times daily - no vision changes, pain around eye, light sensitivity  Cloudy urine - feels like urine has been cloudy - reports lots of UTIs due to renal papillary necrosis (2/2 sickle cell trait and analgesic use) - hasn't called her kidney doctor yet, asks if we can do the urine culture here - some suprapubic subjective swelling - little burning with urination - no painful urination  PERTINENT  PMH / PSH:  Patient Active Problem List   Diagnosis Date Noted   Upper respiratory infection with cough and congestion 02/12/2021   Viral conjunctivitis of left eye 02/12/2021   Routine physical examination 02/07/2020   Bilateral lower extremity edema 07/14/2019   Acute cystitis with hematuria 06/30/2019   Tongue lesion 06/30/2019   History of total knee replacement, 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   Metatarsalgia of both feet 01/18/2018   Primary osteoarthritis of left knee 12/18/2016   Tricompartmental disease of knee 05/09/2013   Obesity (BMI 30-39.9) 05/11/2012   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   Right knee pain 06/22/2011   Renal papillary  necrosis (HCC) 08/29/2010   Asthma 12/29/2009   POSTMENOPAUSAL SYNDROME 08/26/2009   Osteoarthritis of right knee 08/26/2009   ALLERGIC RHINITIS 05/29/2007   SICKLE CELL TRAIT 05/13/2006     OBJECTIVE:   BP 112/77   Pulse 93   Temp 98.6 F (37 C) (Oral)   SpO2 100%    PHQ-9:  Depression screen [facility] 2/9 02/10/2021 02/07/2020 07/13/2019  Decreased Interest 0 0 0  Down, Depressed, Hopeless 0 0 0  PHQ - 2 Score 0 0 0  Altered sleeping 0 0 -  Tired, decreased energy 1 1 -  Change in appetite 0 0 -  Feeling bad or failure about yourself  0 0 -  Trouble concentrating 0 0 -  Moving slowly or fidgety/restless 0 0 -  Suicidal thoughts 0 0 -  PHQ-9 Score 1 1 -  Difficult doing work/chores Not difficult at all Somewhat difficult -    GAD-7:  GAD 7 : Generalized Anxiety Score 12/18/2016  Nervous, Anxious, on Edge 1  Control/stop worrying 0  Worry too much - different things 1  Trouble relaxing 0  Restless 1  Easily annoyed or irritable 1  Afraid - awful might happen 1  Total GAD 7 Score 5  Anxiety Difficulty Somewhat difficult   Physical Exam General: Awake, alert, oriented HEENT: bilateral sclera clear, EOM intact, no crusting visible, oral mucosa pink/moist/without lesion, uvula midline, posterior oropharynx without erythema or exudate Lymph:   Cardiovascular: Regular rate and rhythm, S1 and S2 present, no murmurs auscultated Respiratory: Lung fields clear to auscultation bilaterally,  although somewhat globally diminished Extremities: No bilateral lower extremity edema, palpable pedal and pretibial pulses bilaterally Neuro: Cranial nerves II through X grossly intact, able to move all extremities spontaneously   ASSESSMENT/PLAN:   Upper respiratory infection with cough and congestion Subacute, 2 week duration. Slow improvement. Suspect viral URI, likely RSV due to recent exposure. No concurrent asthma exacerbation. Recommend supportive measures. Return precautions discussed.    Viral conjunctivitis of left eye Acute. Likely related to concurrent viral URI. No red flag symptoms. Recommend continued conservative measures. Return precautions discussed.   Acute cystitis with hematuria Acute. Cloudy urine with UTI sxs. No systemic sxs such as fever. Has nephrologist who normally treates given history renal papillary necrosis, but would like to simply submit urine sample and initiate treatment here. Will order UA, urine culture. UA indicates UTI, will initiate empiric macrobid until culture results come back.     Ezequiel Essex, MD [place]

## 2021-02-10 NOTE — Patient Instructions (Addendum)
It was wonderful to meet you today. Thank you for allowing me to be a part of your care. Below is a short summary of what we discussed at your visit today:  Cough, congestion - I think this is a simple cold caused by a virus called RSV - keep using supportive measures like we talked about - I have sent cough drops to your pharmacy  Viral conjunctivitis - keep doing what you are doing with the warm compresses and eye drops - call me if it gets swollen, red, or more crusty  Urine symptoms - I will send you a MyChart message with the results of your urine studies - I won't rpescribe medicine until after the results come back   Please bring all of your medications to every appointment!  If you have any questions or concerns, please do not hesitate to contact us via phone or MyChart message.   Ezequiel Essex, MD

## 2021-02-10 NOTE — Telephone Encounter (Signed)
Patient calls nurse line reporting cough and congestion. Patient reports her symptoms started "early" last week and progressively got worse. Patient reports she feels better today, however has a "terrible" cough.   Patient denies fever/chills or SOB. Patient does report intermittent nose bleeds. 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 Patient has not been testetd for flu or covid.   Patient scheduled for this afternoon for evaluation.

## 2021-02-12 DIAGNOSIS — J069 Acute upper respiratory infection, unspecified: Secondary | ICD-10-CM | POA: Insufficient documentation

## 2021-02-12 DIAGNOSIS — B309 Viral conjunctivitis, unspecified: Secondary | ICD-10-CM | POA: Insufficient documentation

## 2021-02-12 NOTE — Assessment & Plan Note (Signed)
Acute. Cloudy urine with UTI sxs. No systemic sxs such as fever. Has nephrologist who normally treates given history renal papillary necrosis, but would like to simply submit urine sample and initiate treatment here. Will order UA, urine culture. UA indicates UTI, will initiate empiric macrobid until culture results come back.

## 2021-02-12 NOTE — Assessment & Plan Note (Signed)
Subacute, 2 week duration. Slow improvement. Suspect viral URI, likely RSV due to recent exposure. No concurrent asthma exacerbation. Recommend supportive measures. Return precautions discussed.

## 2021-02-12 NOTE — Assessment & Plan Note (Signed)
Acute. Likely related to concurrent viral URI. No red flag symptoms. Recommend continued conservative measures. Return precautions discussed.

## 2021-02-13 LAB — URINE CULTURE

## 2021-08-19 ENCOUNTER — Encounter: Payer: Self-pay | Admitting: *Deleted

## 2021-08-28 ENCOUNTER — Telehealth: Payer: Self-pay

## 2021-08-28 NOTE — Telephone Encounter (Signed)
Patient calls nurse line requesting to schedule appointment for annual physical and chest pain.  Patient reports that she has been having intermittent chest pain since Monday, however, pain stopped when she woke up this morning. She states that pain has been relieved with Tums. Patient has no associated SHOB, arm pain or jaw pain. No active CP at this time.   Scheduled patient for annual physical and further evaluation next Friday with Dr. Posey Pronto.   Patient provided with red flags and ED precautions.   Talbot Grumbling, RN

## 2021-08-29 NOTE — Telephone Encounter (Signed)
Thank you I will follow up with her next week.

## 2021-09-05 ENCOUNTER — Encounter: Payer: Self-pay | Admitting: Family Medicine

## 2021-09-05 ENCOUNTER — Ambulatory Visit (INDEPENDENT_AMBULATORY_CARE_PROVIDER_SITE_OTHER): Payer: 59 | Admitting: Family Medicine

## 2021-09-05 ENCOUNTER — Ambulatory Visit (HOSPITAL_COMMUNITY)
Admission: RE | Admit: 2021-09-05 | Discharge: 2021-09-05 | Disposition: A | Discharge status: Home / Self Care | Payer: 59 | Source: Ambulatory Visit | Attending: Family Medicine | Admitting: Family Medicine

## 2021-09-05 ENCOUNTER — Other Ambulatory Visit: Payer: Self-pay

## 2021-09-05 VITALS — BP 128/81 | HR 77 | Wt 190.6 lb

## 2021-09-05 DIAGNOSIS — K219 Gastro-esophageal reflux disease without esophagitis: Secondary | ICD-10-CM | POA: Diagnosis not present

## 2021-09-05 DIAGNOSIS — R079 Chest pain, unspecified: Secondary | ICD-10-CM | POA: Diagnosis present

## 2021-09-08 DIAGNOSIS — K219 Gastro-esophageal reflux disease without esophagitis: Secondary | ICD-10-CM | POA: Insufficient documentation

## 2021-09-12 ENCOUNTER — Encounter: Payer: Self-pay | Admitting: Family Medicine

## 2021-09-12 ENCOUNTER — Other Ambulatory Visit: Payer: Self-pay | Admitting: Family Medicine

## 2021-09-12 ENCOUNTER — Ambulatory Visit (INDEPENDENT_AMBULATORY_CARE_PROVIDER_SITE_OTHER): Payer: 59 | Admitting: Family Medicine

## 2021-09-12 VITALS — BP 134/78 | HR 72 | Ht 66.5 in | Wt 192.0 lb

## 2021-09-12 DIAGNOSIS — G8929 Other chronic pain: Secondary | ICD-10-CM | POA: Diagnosis not present

## 2021-09-12 DIAGNOSIS — Z90710 Acquired absence of both cervix and uterus: Secondary | ICD-10-CM

## 2021-09-12 DIAGNOSIS — M25511 Pain in right shoulder: Secondary | ICD-10-CM

## 2021-09-12 DIAGNOSIS — Z0000 Encounter for general adult medical examination without abnormal findings: Secondary | ICD-10-CM | POA: Diagnosis not present

## 2021-09-12 LAB — POCT GLYCOSYLATED HEMOGLOBIN (HGB A1C): Hemoglobin A1C: 5.7 % — AB (ref 4.0–5.6)

## 2021-09-12 MED ORDER — FAMOTIDINE 40 MG PO TABS: 40.0000 mg | ORAL_TABLET | Freq: Every day | ORAL | 0 refills | Status: DC | PRN

## 2021-09-12 NOTE — Patient Instructions (Signed)
Thank you for coming to see me today. It was a pleasure. Today we discussed your shoulder pain. It could be a biceps tendinitis. I recommend: ice, rest, tylneol '650mg'$  every 4-6 hrs and diclofenac gel as needed I have referred you to sports medicine.  We will get some labs today.  If they are abnormal or we need to do something about them, I will call you.  If they are normal, I will send you a message on MyChart (if it is active) or a letter in the mail.  If you don't hear from [place] in 2 weeks, please call the office at the number below.   Shingles vaccine Covid vaccine   Please follow-up with PCP as needed   If you have any questions or concerns, please do not hesitate to call the office at [phone].  Best wishes,   Dr Posey Pronto

## 2021-09-12 NOTE — Progress Notes (Signed)
    SUBJECTIVE:   Chief compliant/HPI: annual examination  Anna Berry is a 61 y.o. who presents today for an annual exam.   Right shoulder pain On going for several years and worse over the last few months. Pain is mostly anterior biceps. Has tried tylenol which does not help. Assoc sx include neck stiffness and right limb weakness. Pt is right handed. No injuries or falls. Does have a hx of left AC joint arthritis and had 1 steroid injection in 2013. Use to unload trucks   History tabs reviewed and updated.   Review of systems form reviewed.   OBJECTIVE:   BP 134/78   Pulse 72   Ht 5' 6.5" (1.689 m)   Wt 192 lb (87.1 kg)   SpO2 97%   BMI 30.53 kg/m    General: Alert, no acute distress Cardio: Normal S1 and S2, RRR, no r/m/g Pulm: CTAB, normal work of breathing Abdomen: Bowel sounds normal. Abdomen soft and non-tender.  Extremities: No peripheral edema.  Neuro: Cranial nerves grossly intact   Shoulder: Inspection reveals no obvious deformity, atrophy, or asymmetry. No bruising. No swelling Palpation is normal with no TTP over [facility] joint or bicipital groove. Full ROM in flexion, abduction, internal/external rotation NV intact distally Normal scapular function observed. Special Tests:  - Impingement: Neg [place] - Supraspinatous: Negative empty can. - No painful arc  5/5 upper extremities bilaterally   ASSESSMENT/PLAN:   Shoulder pain, right Broad differentials including biceps tendonitis, biceps tendon tear, supraspinatus tendinitis. Could also be OA but less likely given location of pain is at biceps tendon insertion site. Low suspicion for fracture/disclocation or adhesive capsulitis. Recommended conservative management with tylenol, rest, ice etc. Would avoid frequent NSAID use given age. Pt has had right shoulder xrays 4 years ago which was normal. Could benefit from further imaging ie ultrasound or MRI. Referred to sports medicine for further management.     Annual Examination  See AVS for age appropriate recommendations  PHQ score 4, reviewed and discussed.  BP reviewed and at goal.  Asked about intimate partner violence and resources given as appropriate  Advance directives discussion deferred.  Considered the following items based upon USPSTF recommendations: Diabetes screening: ordered Screening for elevated cholesterol: ordered HIV testing: ordered Hepatitis C: ordered Hepatitis B: ordered Syphilis if at high risk:  not ordered  GC/CT ordered  Osteoporosis screening considered based upon risk of fracture from [facility] calculator. Major osteoporotic fracture risk is 7%. DEXA not ordered.  Reviewed risk factors for latent tuberculosis and not indicated  Discussed family history, BRCA testing not indicated. Tool used to risk stratify was 7.1%.  Cervical cancer screening:  hysterectomy  , not indicated  Breast cancer screening:  due in Nov 23 Colorectal cancer screening:  up to date due in 2025 Lung cancer screening: discussed. See documentation below regarding indications/risks/benefits.  Vaccinations discussed.   Follow up in 1 year or sooner if indicated.   Lattie Haw, MD [place]

## 2021-09-13 LAB — BASIC METABOLIC PANEL
BUN/Creatinine Ratio: 8 — ABNORMAL LOW (ref 12–28)
BUN: 8 mg/dL (ref 8–27)
CO2: 27 mmol/L (ref 20–29)
Calcium: 9.6 mg/dL (ref 8.7–10.3)
Chloride: 104 mmol/L (ref 96–106)
Creatinine, Ser: 0.96 mg/dL (ref 0.57–1.00)
Glucose: 97 mg/dL (ref 70–99)
Potassium: 3.9 mmol/L (ref 3.5–5.2)
Sodium: 146 mmol/L — ABNORMAL HIGH (ref 134–144)
eGFR: 67 mL/min/{1.73_m2} (ref >59)

## 2021-09-13 LAB — LIPID PANEL
Chol/HDL Ratio: 3.4 ratio (ref 0.0–4.4)
Cholesterol, Total: 155 mg/dL (ref 100–199)
HDL: 46 mg/dL (ref >39)
LDL Chol Calc (NIH): 85 mg/dL (ref 0–99)
Triglycerides: 139 mg/dL (ref 0–149)
VLDL Cholesterol Cal: 24 mg/dL (ref 5–40)

## 2021-09-13 LAB — HIV ANTIBODY (ROUTINE TESTING W REFLEX): HIV Screen 4th Generation wRfx: NONREACTIVE

## 2021-09-13 LAB — HEPATITIS B SURFACE ANTIGEN: Hepatitis B Surface Ag: NEGATIVE

## 2021-09-13 LAB — HEPATITIS C ANTIBODY: Hep C Virus Ab: NONREACTIVE

## 2021-09-15 DIAGNOSIS — M25511 Pain in right shoulder: Secondary | ICD-10-CM | POA: Insufficient documentation

## 2021-09-15 NOTE — Assessment & Plan Note (Addendum)
Broad differentials including biceps tendonitis, biceps tendon tear, supraspinatus tendinitis. Could also be OA but less likely given location of pain is at biceps tendon insertion site. Low suspicion for fracture/disclocation or adhesive capsulitis. Recommended conservative management with tylenol, rest, ice etc. Would avoid frequent NSAID use given age. Pt has had right shoulder xrays 4 years ago which was normal. Could benefit from further imaging ie ultrasound or MRI. Referred to sports medicine for further management.

## 2021-09-25 ENCOUNTER — Telehealth: Payer: Self-pay | Admitting: Family Medicine

## 2021-09-25 NOTE — Telephone Encounter (Signed)
Called patient however unable to reach her.  Her husband answered the phone.  I explained to him that the matter was nonurgent and I would call her again tomorrow.  I explained that I was unable to disclose her medical information over the phone without her consent.  She will tell him I called.

## 2021-09-29 ENCOUNTER — Ambulatory Visit (INDEPENDENT_AMBULATORY_CARE_PROVIDER_SITE_OTHER): Payer: 59 | Admitting: Family Medicine

## 2021-09-29 ENCOUNTER — Ambulatory Visit: Payer: Self-pay

## 2021-09-29 VITALS — BP 150/70 | Ht 66.5 in | Wt 192.0 lb

## 2021-09-29 DIAGNOSIS — G8929 Other chronic pain: Secondary | ICD-10-CM

## 2021-09-29 DIAGNOSIS — M25511 Pain in right shoulder: Secondary | ICD-10-CM

## 2021-09-29 MED ORDER — NITROGLYCERIN 0.2 MG/HR TD PT24: MEDICATED_PATCH | TRANSDERMAL | 1 refills | Status: DC

## 2021-09-29 NOTE — Patient Instructions (Signed)
You have an old rotator cuff tear of your shoulder and tendinitis. Try to avoid painful activities (overhead activities, lifting with extended arm) as much as possible. Nitro patches 1/4th patch to affected shoulder, change daily. Topical voltaren gel and/or biofreeze if needed. Tylenol rarely if needed. Subacromial injection may be beneficial to help with pain and to decrease inflammation if you're struggling with the exercises. Consider physical therapy with transition to home exercise program. Do home exercise program with theraband and scapular stabilization exercises daily 3 sets of 10 once a day. Follow up with me in 5-6 weeks.

## 2021-09-30 ENCOUNTER — Other Ambulatory Visit: Payer: Self-pay | Admitting: Family Medicine

## 2021-09-30 ENCOUNTER — Telehealth: Payer: Self-pay | Admitting: Family Medicine

## 2021-09-30 ENCOUNTER — Encounter: Payer: Self-pay | Admitting: Family Medicine

## 2021-09-30 ENCOUNTER — Other Ambulatory Visit: Payer: 59

## 2021-09-30 DIAGNOSIS — E870 Hyperosmolality and hypernatremia: Secondary | ICD-10-CM

## 2021-09-30 NOTE — Telephone Encounter (Signed)
Called pt and she is coming in for labs later today.

## 2021-09-30 NOTE — Progress Notes (Signed)
PCP: Alcus Dad, MD  Subjective:   HPI: Patient is a 61 y.o. female here for right shoulder pain.  Patient reports several year history of right shoulder pain. This has worsened over past several months and she has just been dealing with this. She tries to avoid nsaids and tylenol as much as possible givne history of kidney issues (renal papillary necrosis). Pain worse when trying to reach overhead to get something off a shelf. Difficulty lying on right side due to pain. Feels weak as well. Tried topical medications.  Past Medical History:  Diagnosis Date   Allergy    Anemia    Arthritis    Asthma    allergies induced   Bilateral lower extremity edema    Depression    Fibroids 08/2002   Gross hematuria 05/2010   History of appendicitis    History of colon polyps 2017   Multiple renal cysts 05/24/2008   Two left renal parapelvic cysts.   OA (osteoarthritis) of knee    Right   Obesity    Plantar fasciitis    Postmenopausal    Pre-diabetes    Renal papillary necrosis (HCC)    Seasonal allergies    Sickle cell anemia (HCC)    sickle cell trait   Sickle cell trait ([place])    Sigmoid diverticulosis 09/26/2015   Noted on colonoscopy    Current Outpatient Medications on File Prior to Visit  Medication Sig Dispense Refill   acetaminophen (TYLENOL) 500 MG tablet Take 1,000 mg by mouth daily as needed for moderate pain.     albuterol (VENTOLIN HFA) 108 (90 Base) MCG/ACT inhaler Inhale 2 puffs into the lungs every 6 (six) hours as needed for wheezing or shortness of breath. 6.7 g 3   benzonatate (TESSALON) 100 MG capsule Take 1 capsule (100 mg total) by mouth 2 (two) times daily as needed for cough. 20 capsule 0   Diclofenac Sodium 3 % CREA Apply 1 application topically 2 (two) times daily as needed. 2500 g 0   famotidine (PEPCID) 40 MG tablet Take 1 tablet (40 mg total) by mouth daily as needed for heartburn or indigestion. 30 tablet 0   loratadine (CLARITIN) 10 MG tablet  Take 10 mg by mouth daily as needed for allergies.      polyethylene glycol powder (GLYCOLAX/MIRALAX) powder Take 17 g by mouth 2 (two) times daily as needed. (Patient taking differently: Take 17 g by mouth 2 (two) times daily as needed for mild constipation. ) 3350 g 1   senna-docusate (SENOKOT S) 8.6-50 MG tablet Take 1-2 tablets by mouth at bedtime as needed. (Patient not taking: Reported on 02/07/2020) 30 tablet 1   Tetrahydrozoline HCl (VISINE OP) Place 1 drop into both eyes daily as needed (allergies).     No current facility-administered medications on file prior to visit.    Past Surgical History:  Procedure Laterality Date   ABDOMINAL HYSTERECTOMY  2004   APPENDECTOMY  2006   [place]   x2   COLONOSCOPY     COLONOSCOPY W/ POLYPECTOMY  09/26/2015   POLYPECTOMY     TOTAL KNEE ARTHROPLASTY Right 07/29/2018   Procedure: RIGHT TOTAL KNEE ARTHROPLASTY;  Surgeon: Leandrew Koyanagi, MD;  Location: [facility];  Service: Orthopedics;  Laterality: Right;    Allergies  Allergen Reactions   Acetaminophen     Renal papillary necrosis in high doses   Morphine And Related Nausea And Vomiting   Nsaids     Renal  papillary necrosis   Other Itching and Swelling    Tree nuts    BP (!) 150/70   Ht 5' 6.5" (1.689 m)   Wt 192 lb (87.1 kg)   BMI 30.53 kg/m       No data to display              No data to display              Objective:  Physical Exam:  Gen: NAD, comfortable in exam room  Right shoulder: No swelling, ecchymoses.  No gross deformity. TTP over proximal biceps tendon.  No AC, other tenderness. FROM with painful arc. Positive Hawkins, Neers. Negative Yergasons, positive speeds. Strength 4/5 with empty can and 5/5 resisted internal/external rotation. NV intact distally.  Complete MSK u/s right shoulder: Biceps tendon: moderate tenosynovitis with tendinopathy Pec major tendon: intact Subscapularis: intact without abnormalities AC joint: mild  arthropathy with trace effusion Infraspinatus: intact with overlying bursitis Supraspinatus: full thickness retracted tear anterior portion of the tendon Posterior glenohumeral joint: trace effusion.  No paralabral cyst.  Visible labrum appears intact.  Impression:  Full thickness retracted tear of anterior supraspinatus, remote with high riding humeral head. Biceps tenosynovitis with tendinopathy Mild AC arthropathy with trace effusion    Assessment & Plan:  1. Right shoulder pain - primary issue is remote full thickness anterior supraspinatus tear.  She has bursitis along with this and fluid is tracking down biceps tendon giving some discomfort here as well.  Do not think this is reparable given how remote this likely is.  Discussed PT vs home exercises and she would like to start with home exercises.  Nitro patches - discussed risks of headache, skin irritation.  Topical medications.  Consider formal PT, subacromial injection if she's struggling.  F/u in 5-6 weeks.

## 2021-09-30 NOTE — Telephone Encounter (Signed)
Called pt to explain that her sodium was slightly elevated on recent BMP and that she should come in for a repeat BMP. She will come in later today for BMP.

## 2021-09-30 NOTE — Telephone Encounter (Signed)
Patient LVM on nurse line returning call to MD.

## 2021-10-01 ENCOUNTER — Encounter: Payer: Self-pay | Admitting: Family Medicine

## 2021-10-01 LAB — BASIC METABOLIC PANEL
BUN/Creatinine Ratio: 11 — ABNORMAL LOW (ref 12–28)
BUN: 11 mg/dL (ref 8–27)
CO2: 25 mmol/L (ref 20–29)
Calcium: 9.3 mg/dL (ref 8.7–10.3)
Chloride: 103 mmol/L (ref 96–106)
Creatinine, Ser: 1 mg/dL (ref 0.57–1.00)
Glucose: 83 mg/dL (ref 70–99)
Potassium: 4 mmol/L (ref 3.5–5.2)
Sodium: 143 mmol/L (ref 134–144)
eGFR: 64 mL/min/{1.73_m2} (ref >59)

## 2021-10-10 ENCOUNTER — Other Ambulatory Visit: Payer: Self-pay | Admitting: *Deleted

## 2021-10-10 MED ORDER — ALBUTEROL SULFATE HFA 108 (90 BASE) MCG/ACT IN AERS
2.0000 | INHALATION_SPRAY | Freq: Four times a day (QID) | RESPIRATORY_TRACT | 0 refills | Status: DC | PRN
Start: 2021-10-10 — End: 2022-06-26

## 2021-11-05 ENCOUNTER — Ambulatory Visit: Payer: 59 | Admitting: Family Medicine

## 2022-03-27 ENCOUNTER — Telehealth: Payer: Self-pay

## 2022-03-27 ENCOUNTER — Encounter: Payer: Self-pay | Admitting: Student

## 2022-03-27 ENCOUNTER — Ambulatory Visit (INDEPENDENT_AMBULATORY_CARE_PROVIDER_SITE_OTHER): Payer: 59 | Admitting: Student

## 2022-03-27 VITALS — BP 118/72 | HR 73 | Wt 196.0 lb

## 2022-03-27 DIAGNOSIS — N3001 Acute cystitis with hematuria: Secondary | ICD-10-CM

## 2022-03-27 DIAGNOSIS — N3000 Acute cystitis without hematuria: Secondary | ICD-10-CM | POA: Insufficient documentation

## 2022-03-27 LAB — POCT URINALYSIS DIP (MANUAL ENTRY)
Bilirubin, UA: NEGATIVE
Glucose, UA: NEGATIVE mg/dL
Ketones, POC UA: NEGATIVE mg/dL
Nitrite, UA: NEGATIVE
Protein Ur, POC: NEGATIVE mg/dL
Spec Grav, UA: 1.015 (ref 1.010–1.025)
Urobilinogen, UA: 0.2 E.U./dL
pH, UA: 7 (ref 5.0–8.0)

## 2022-03-27 LAB — POCT UA - MICROSCOPIC ONLY

## 2022-03-27 MED ORDER — NITROFURANTOIN MONOHYD MACRO 100 MG PO CAPS: 100.0000 mg | ORAL_CAPSULE | Freq: Two times a day (BID) | ORAL | 0 refills | Status: AC

## 2022-03-27 NOTE — Patient Instructions (Signed)
It was great to see you! Thank you for allowing me to participate in your care!   I recommend that you always bring your medications to each appointment as this makes it easy to ensure we are on the correct medications and helps [place] not miss when refills are needed.  Our plans for today:  - Please take 1 tablet of Nitrofurantoin two times daily for 5 days - Follow-up in 2-4 weeks to discuss leg swelling  Take care and seek immediate care sooner if you develop any concerns. Please remember to show up 15 minutes before your scheduled appointment time!  Leslie Dales, DO [facility]

## 2022-03-27 NOTE — Telephone Encounter (Signed)
Patient calls nurse line reporting lower abdominal pain.   She reports the pain started yesterday and rates her pain as 6/10. She reports it feels like "period cramps" however reports not having a period in a "long time."   She denies any vaginal bleeding. She reports the pain is "so bad" she is having a hard time walking.   She denies any nausea, vomiting, diarrhea or fevers. No trouble urinating.   Patient scheduled for this afternoon for evaluation.

## 2022-03-27 NOTE — Progress Notes (Addendum)
    SUBJECTIVE:   CHIEF COMPLAINT / HPI:   Abdominal pain  Urinary frequency Pressure and pain (started 1-2 days ago), has increased frequency but not able to quantify the amount of time she is urinating each day.  Additionally she does not feel that she empties her bladder.  She endorses experiencing some incontinence for the last couple of months.  Features include incontinence with laughing and sneezing + urgency and unable to make it to the bathroom.  Does not take medication every day.  History of recurrent UTIs, but never had previous obstructive pathology.  History of renal prep with necrosis in 2012. No fevers, chills, diarrhea. Does have constipation issues, but had bowel movement yesterday. History of hysterectomy, appendectomy, 2 c-sections.   Leg edema Patient endorses some fluid buildup in her feet, does not take medication for this.  Is followed by nephrology.  Last BMP had stable renal function with GFR greater than 60 and stable creatinine.  Patient Dors is swelling is well-controlled with compression socks and elevation of feet, also endorses works on her feet often.  Recommend patient follow-up with PCP to discuss increased leg swelling.  OBJECTIVE:   BP 118/72   Pulse 73   Wt 196 lb (88.9 kg)   SpO2 98%   BMI 31.16 kg/m    General: NAD, pleasant Cardio: RRR, no MRG Respiratory: CTAB, normal breathing room air GI: Soft, not tender, not distended.  Bowel sounds present GU: No CVA tenderness, patient endorses increased feeling of pressure with palpation over suprapubic region-no tenderness. Extremities: Very mild, nonpitting edema in bilateral lower legs  ASSESSMENT/PLAN:   Acute cystitis with hematuria Increased frequency and urgency. Patient endorses mild stress incontinence symptoms at base-line that are worsened over last 2 days. Hx of recurrent UTI. UA positive for Leukocytes and trace blood. - Bactrim 100 mg twice daily for 5 days - Urine culture pending  (recurrent UTI) - If symptoms fail to resolve, please make appointment for follow-up - Please make appointment for urinary incontinence if symptoms worsen  Leg edema - Very mild leg edema on exam today - No chest pain, no shortness of breath, most recent lab work showed stable renal function - Recommend follow-up in 2 to 4 weeks with PCP to discuss (and obtain updated renal function)   Leslie Dales, [place]

## 2022-03-27 NOTE — Assessment & Plan Note (Signed)
Increased frequency and urgency. Patient endorses mild stress incontinence symptoms at base-line that are worsened over last 2 days. Hx of recurrent UTI. UA positive for Leukocytes and trace blood. - Bactrim 100 mg twice daily for 5 days - If symptoms fail to resolve, please make appointment for follow-up - Please make appointment for urinary incontinence if symptoms worsen

## 2022-03-27 NOTE — Assessment & Plan Note (Signed)
Differential includes: UTI, stress incontinence, overactive bladder, retention/obstruction.  UA showed**.  Although patient endorses urinary incontinence, does not have stress features.  Difficult to control sudden urge to urinate, consistent with overactive bladder. Versus overflow incontinence as patient reports feeling unable to fully empty bladder with increased suprapubic pressure. Medication list not significant for anticholinergic medications.

## 2022-04-03 LAB — URINE CULTURE

## 2022-06-26 ENCOUNTER — Other Ambulatory Visit: Payer: Self-pay

## 2022-06-26 MED ORDER — ALBUTEROL SULFATE HFA 108 (90 BASE) MCG/ACT IN AERS: 2.0000 | INHALATION_SPRAY | Freq: Four times a day (QID) | RESPIRATORY_TRACT | 0 refills | Status: AC | PRN

## 2022-07-24 ENCOUNTER — Inpatient Hospital Stay (HOSPITAL_COMMUNITY)
Admission: EM | Admit: 2022-07-24 | Discharge: 2022-08-18 | DRG: 064 | Disposition: A | Discharge status: Skilled Nursing Facility | Payer: BLUE CROSS/BLUE SHIELD | Attending: Internal Medicine | Admitting: Internal Medicine

## 2022-07-24 ENCOUNTER — Emergency Department (HOSPITAL_COMMUNITY): Payer: BLUE CROSS/BLUE SHIELD

## 2022-07-24 ENCOUNTER — Other Ambulatory Visit: Payer: Self-pay

## 2022-07-24 ENCOUNTER — Encounter (HOSPITAL_COMMUNITY): Payer: Self-pay

## 2022-07-24 DIAGNOSIS — E870 Hyperosmolality and hypernatremia: Secondary | ICD-10-CM | POA: Diagnosis not present

## 2022-07-24 DIAGNOSIS — G8191 Hemiplegia, unspecified affecting right dominant side: Secondary | ICD-10-CM | POA: Diagnosis present

## 2022-07-24 DIAGNOSIS — Z90710 Acquired absence of both cervix and uterus: Secondary | ICD-10-CM

## 2022-07-24 DIAGNOSIS — Z800 Family history of malignant neoplasm of digestive organs: Secondary | ICD-10-CM

## 2022-07-24 DIAGNOSIS — E785 Hyperlipidemia, unspecified: Secondary | ICD-10-CM | POA: Diagnosis present

## 2022-07-24 DIAGNOSIS — N179 Acute kidney failure, unspecified: Secondary | ICD-10-CM | POA: Diagnosis not present

## 2022-07-24 DIAGNOSIS — Z823 Family history of stroke: Secondary | ICD-10-CM

## 2022-07-24 DIAGNOSIS — K219 Gastro-esophageal reflux disease without esophagitis: Secondary | ICD-10-CM | POA: Diagnosis present

## 2022-07-24 DIAGNOSIS — R030 Elevated blood-pressure reading, without diagnosis of hypertension: Secondary | ICD-10-CM | POA: Diagnosis not present

## 2022-07-24 DIAGNOSIS — R748 Abnormal levels of other serum enzymes: Secondary | ICD-10-CM | POA: Diagnosis not present

## 2022-07-24 DIAGNOSIS — Z888 Allergy status to other drugs, medicaments and biological substances status: Secondary | ICD-10-CM

## 2022-07-24 DIAGNOSIS — K5730 Diverticulosis of large intestine without perforation or abscess without bleeding: Secondary | ICD-10-CM | POA: Diagnosis present

## 2022-07-24 DIAGNOSIS — F419 Anxiety disorder, unspecified: Secondary | ICD-10-CM | POA: Diagnosis present

## 2022-07-24 DIAGNOSIS — J45909 Unspecified asthma, uncomplicated: Secondary | ICD-10-CM | POA: Diagnosis present

## 2022-07-24 DIAGNOSIS — G935 Compression of brain: Secondary | ICD-10-CM

## 2022-07-24 DIAGNOSIS — Z886 Allergy status to analgesic agent status: Secondary | ICD-10-CM

## 2022-07-24 DIAGNOSIS — Z841 Family history of disorders of kidney and ureter: Secondary | ICD-10-CM

## 2022-07-24 DIAGNOSIS — E872 Acidosis, unspecified: Secondary | ICD-10-CM | POA: Diagnosis not present

## 2022-07-24 DIAGNOSIS — Z8719 Personal history of other diseases of the digestive system: Secondary | ICD-10-CM

## 2022-07-24 DIAGNOSIS — I609 Nontraumatic subarachnoid hemorrhage, unspecified: Secondary | ICD-10-CM | POA: Diagnosis present

## 2022-07-24 DIAGNOSIS — Z96651 Presence of right artificial knee joint: Secondary | ICD-10-CM | POA: Diagnosis present

## 2022-07-24 DIAGNOSIS — I629 Nontraumatic intracranial hemorrhage, unspecified: Principal | ICD-10-CM | POA: Diagnosis present

## 2022-07-24 DIAGNOSIS — I611 Nontraumatic intracerebral hemorrhage in hemisphere, cortical: Secondary | ICD-10-CM | POA: Diagnosis present

## 2022-07-24 DIAGNOSIS — Z23 Encounter for immunization: Secondary | ICD-10-CM

## 2022-07-24 DIAGNOSIS — Z79899 Other long term (current) drug therapy: Secondary | ICD-10-CM

## 2022-07-24 DIAGNOSIS — Z833 Family history of diabetes mellitus: Secondary | ICD-10-CM

## 2022-07-24 DIAGNOSIS — Z86010 Personal history of colonic polyps: Secondary | ICD-10-CM

## 2022-07-24 DIAGNOSIS — Z597 Insufficient social insurance and welfare support: Secondary | ICD-10-CM

## 2022-07-24 DIAGNOSIS — I619 Nontraumatic intracerebral hemorrhage, unspecified: Secondary | ICD-10-CM | POA: Diagnosis present

## 2022-07-24 DIAGNOSIS — Z6832 Body mass index (BMI) 32.0-32.9, adult: Secondary | ICD-10-CM

## 2022-07-24 DIAGNOSIS — E669 Obesity, unspecified: Secondary | ICD-10-CM | POA: Diagnosis present

## 2022-07-24 DIAGNOSIS — F444 Conversion disorder with motor symptom or deficit: Secondary | ICD-10-CM

## 2022-07-24 DIAGNOSIS — R4701 Aphasia: Secondary | ICD-10-CM | POA: Diagnosis present

## 2022-07-24 DIAGNOSIS — R29810 Facial weakness: Secondary | ICD-10-CM | POA: Diagnosis present

## 2022-07-24 DIAGNOSIS — M1990 Unspecified osteoarthritis, unspecified site: Secondary | ICD-10-CM | POA: Diagnosis present

## 2022-07-24 DIAGNOSIS — B961 Klebsiella pneumoniae [K. pneumoniae] as the cause of diseases classified elsewhere: Secondary | ICD-10-CM | POA: Diagnosis not present

## 2022-07-24 DIAGNOSIS — N390 Urinary tract infection, site not specified: Secondary | ICD-10-CM | POA: Diagnosis not present

## 2022-07-24 DIAGNOSIS — I610 Nontraumatic intracerebral hemorrhage in hemisphere, subcortical: Secondary | ICD-10-CM

## 2022-07-24 DIAGNOSIS — J45902 Unspecified asthma with status asthmaticus: Secondary | ICD-10-CM

## 2022-07-24 DIAGNOSIS — R29717 NIHSS score 17: Secondary | ICD-10-CM | POA: Diagnosis present

## 2022-07-24 DIAGNOSIS — R1310 Dysphagia, unspecified: Secondary | ICD-10-CM | POA: Diagnosis present

## 2022-07-24 DIAGNOSIS — Z713 Dietary counseling and surveillance: Secondary | ICD-10-CM

## 2022-07-24 DIAGNOSIS — R509 Fever, unspecified: Secondary | ICD-10-CM | POA: Diagnosis not present

## 2022-07-24 DIAGNOSIS — G936 Cerebral edema: Secondary | ICD-10-CM | POA: Diagnosis present

## 2022-07-24 DIAGNOSIS — J690 Pneumonitis due to inhalation of food and vomit: Secondary | ICD-10-CM | POA: Diagnosis not present

## 2022-07-24 DIAGNOSIS — Z885 Allergy status to narcotic agent status: Secondary | ICD-10-CM

## 2022-07-24 DIAGNOSIS — R7989 Other specified abnormal findings of blood chemistry: Secondary | ICD-10-CM | POA: Diagnosis not present

## 2022-07-24 DIAGNOSIS — D571 Sickle-cell disease without crisis: Secondary | ICD-10-CM | POA: Diagnosis present

## 2022-07-24 DIAGNOSIS — E8809 Other disorders of plasma-protein metabolism, not elsewhere classified: Secondary | ICD-10-CM | POA: Diagnosis present

## 2022-07-24 DIAGNOSIS — B9620 Unspecified Escherichia coli [E. coli] as the cause of diseases classified elsewhere: Secondary | ICD-10-CM | POA: Diagnosis not present

## 2022-07-24 LAB — CBC
HCT: 42.5 % (ref 36.0–46.0)
Hemoglobin: 13.3 g/dL (ref 12.0–15.0)
MCH: 28.2 pg (ref 26.0–34.0)
MCHC: 31.3 g/dL (ref 30.0–36.0)
MCV: 90 fL (ref 80.0–100.0)
Platelets: 268 10*3/uL (ref 150–400)
RBC: 4.72 MIL/uL (ref 3.87–5.11)
RDW: 15.1 % (ref 11.5–15.5)
WBC: 11.3 10*3/uL — ABNORMAL HIGH (ref 4.0–10.5)
nRBC: 0 % (ref 0.0–0.2)

## 2022-07-24 LAB — I-STAT CHEM 8, ED
BUN: 14 mg/dL (ref 8–23)
Calcium, Ion: 0.94 mmol/L — ABNORMAL LOW (ref 1.15–1.40)
Chloride: 108 mmol/L (ref 98–111)
Creatinine, Ser: 1.1 mg/dL — ABNORMAL HIGH (ref 0.44–1.00)
Glucose, Bld: 109 mg/dL — ABNORMAL HIGH (ref 70–99)
HCT: 40 % (ref 36.0–46.0)
Hemoglobin: 13.6 g/dL (ref 12.0–15.0)
Potassium: 4.2 mmol/L (ref 3.5–5.1)
Sodium: 140 mmol/L (ref 135–145)
TCO2: 27 mmol/L (ref 22–32)

## 2022-07-24 LAB — PROTIME-INR
INR: 1.1 (ref 0.8–1.2)
Prothrombin Time: 14.2 seconds (ref 11.4–15.2)

## 2022-07-24 LAB — DIFFERENTIAL
Abs Immature Granulocytes: 0.03 10*3/uL (ref 0.00–0.07)
Basophils Absolute: 0.1 10*3/uL (ref 0.0–0.1)
Basophils Relative: 1 %
Eosinophils Absolute: 0.2 10*3/uL (ref 0.0–0.5)
Eosinophils Relative: 2 %
Immature Granulocytes: 0 %
Lymphocytes Relative: 17 %
Lymphs Abs: 1.9 10*3/uL (ref 0.7–4.0)
Monocytes Absolute: 0.5 10*3/uL (ref 0.1–1.0)
Monocytes Relative: 4 %
Neutro Abs: 8.6 10*3/uL — ABNORMAL HIGH (ref 1.7–7.7)
Neutrophils Relative %: 76 %

## 2022-07-24 LAB — COMPREHENSIVE METABOLIC PANEL
ALT: 19 U/L (ref 0–44)
AST: 27 U/L (ref 15–41)
Albumin: 4.2 g/dL (ref 3.5–5.0)
Alkaline Phosphatase: 79 U/L (ref 38–126)
Anion gap: 11 (ref 5–15)
BUN: 10 mg/dL (ref 8–23)
CO2: 24 mmol/L (ref 22–32)
Calcium: 9.5 mg/dL (ref 8.9–10.3)
Chloride: 105 mmol/L (ref 98–111)
Creatinine, Ser: 1.12 mg/dL — ABNORMAL HIGH (ref 0.44–1.00)
GFR, Estimated: 55 mL/min — ABNORMAL LOW (ref >60)
Glucose, Bld: 107 mg/dL — ABNORMAL HIGH (ref 70–99)
Potassium: 4.3 mmol/L (ref 3.5–5.1)
Sodium: 140 mmol/L (ref 135–145)
Total Bilirubin: 0.5 mg/dL (ref 0.3–1.2)
Total Protein: 7.2 g/dL (ref 6.5–8.1)

## 2022-07-24 LAB — I-STAT BETA HCG BLOOD, ED (MC, WL, AP ONLY): I-stat hCG, quantitative: <5 m[IU]/mL (ref <5)

## 2022-07-24 LAB — CBG MONITORING, ED: Glucose-Capillary: 118 mg/dL — ABNORMAL HIGH (ref 70–99)

## 2022-07-24 LAB — APTT: aPTT: 23 seconds — ABNORMAL LOW (ref 24–36)

## 2022-07-24 LAB — ETHANOL: Alcohol, Ethyl (B): <10 mg/dL (ref <10)

## 2022-07-24 MED ORDER — LORAZEPAM 2 MG/ML IJ SOLN
0.5000 mg | Freq: Once | INTRAMUSCULAR | Status: AC
  Administered 2022-07-24: 0.5 mg via INTRAVENOUS
  Filled 2022-07-24: qty 1

## 2022-07-24 MED ORDER — SODIUM CHLORIDE 0.9% FLUSH
3.0000 mL | Freq: Once | INTRAVENOUS | Status: AC
  Administered 2022-07-24: 3 mL via INTRAVENOUS

## 2022-07-24 NOTE — Consult Note (Signed)
NEUROLOGY CONSULTATION NOTE   Date of service: Jul 24, 2022 Patient Name: Anna Berry MRN:  [id number] DOB:  1961-02-07 Reason for consult: "speech problems" Requesting Provider: Tegeler, Canary Brim, * _ _ _   _ __   _ __ _ _  __ __   _ __   __ _  History of Present Illness  Anna Berry is a 62 y.o. female with PMH significant for Sickle cell trait, sigmoid diverticulosis, asthma, OA who is brought in by EMS for unable to speak.  Per report, she drove home at 5pm, walked into her home and has not spoken since. Family called EMS and she was brought in as a code stroke.  She is able to tell me her name, age, current month. Voice is hypophonic. She selectively responds to questions, avoid eye contact but looks around. She does endorse anxiety.  Unable to get much history otherwise.  LKW: 1700 mRS: 0 tNKASE: not offered, no deficit, low suspicion for stroke. Thrombectomy: not offered, no deficit, low suspicion for stroke. NIHSS components Score: Comment  1a Level of Conscious 0[x]  1[]  2[]  3[]      1b LOC Questions 0[x]  1[]  2[]       1c LOC Commands 0[x]  1[]  2[]       2 Best Gaze 0[x]  1[]  2[]       3 Visual 0[x]  1[]  2[]  3[]      4 Facial Palsy 0[x]  1[]  2[]  3[]      5a Motor Arm - left 0[x]  1[]  2[]  3[]  4[]  UN[]    5b Motor Arm - Right 0[x]  1[]  2[]  3[]  4[]  UN[]    6a Motor Leg - Left 0[x]  1[]  2[]  3[]  4[]  UN[]    6b Motor Leg - Right 0[x]  1[]  2[]  3[]  4[]  UN[]    7 Limb Ataxia 0[x]  1[]  2[]  3[]  UN[]     8 Sensory 0[x]  1[]  2[]  UN[]      9 Best Language 0[x]  1[]  2[]  3[]      10 Dysarthria 0[x]  1[]  2[]  UN[]      11 Extinct. and Inattention 0[x]  1[]  2[]       TOTAL: 0      ROS   Unable to obtain due to decreased speech output.  Past History   Past Medical History:  Diagnosis Date   Allergy    Anemia    Arthritis    Asthma    allergies induced   Bilateral lower extremity edema    Depression    Fibroids 08/2002   Gross hematuria 05/2010   History of appendicitis    History of  colon polyps 2017   Multiple renal cysts 05/24/2008   Two left renal parapelvic cysts.   OA (osteoarthritis) of knee    Right   Obesity    Plantar fasciitis    Postmenopausal    Pre-diabetes    Renal papillary necrosis (HCC)    Seasonal allergies    Sickle cell anemia (HCC)    sickle cell trait   Sickle cell trait (HCC)    Sigmoid diverticulosis 09/26/2015   Noted on colonoscopy   Past Surgical History:  Procedure Laterality Date   ABDOMINAL HYSTERECTOMY  2004   APPENDECTOMY  2006   CESAREAN SECTION  1981, 1987   x2   COLONOSCOPY     COLONOSCOPY W/ POLYPECTOMY  09/26/2015   POLYPECTOMY     TOTAL KNEE ARTHROPLASTY Right 07/29/2018   Procedure: RIGHT TOTAL KNEE ARTHROPLASTY;  Surgeon: Tarry Kos, MD;  Location: [facility];  Service: Orthopedics;  Laterality: Right;  Family History  Problem Relation Age of Onset   Stomach cancer Maternal Grandmother 36   Stroke Maternal Grandmother    Diabetes Father    Kidney disease Father    Colon cancer Other    Colon polyps Neg Hx    Esophageal cancer Neg Hx    Rectal cancer Neg Hx    Social History   Socioeconomic History   Marital status: Married    Spouse name: Not on file   Number of children: Not on file   Years of education: Not on file   Highest education level: Not on file  Occupational History   Not on file  Tobacco Use   Smoking status: Never    Passive exposure: Never   Smokeless tobacco: Never  Vaping Use   Vaping Use: Never used  Substance and Sexual Activity   Alcohol use: No    Alcohol/week: 0.0 standard drinks of alcohol   Drug use: No   Sexual activity: Yes    Birth control/protection: Surgical  Other Topics Concern   Not on file  Social History Narrative   Not on file   Social Determinants of Health   Financial Resource Strain: Not on file  Food Insecurity: Not on file  Transportation Needs: Not on file  Physical Activity: Not on file  Stress: Not on file  Social Connections: Not on file    Allergies  Allergen Reactions   Acetaminophen     Renal papillary necrosis in high doses   Morphine And Related Nausea And Vomiting   Nsaids     Renal papillary necrosis   Other Itching and Swelling    Tree nuts    Medications  (Not in a hospital admission)    Vitals   Vitals:   07/24/22 2041  SpO2: 100%     There is no height or weight on file to calculate BMI.  Physical Exam   General: Laying comfortably in bed; in no acute distress.  HENT: Normal oropharynx and mucosa. Normal external appearance of ears and nose.  Neck: Supple, no pain or tenderness  CV: No JVD. No peripheral edema.  Pulmonary: Symmetric Chest rise. Normal respiratory effort.  Abdomen: Soft to touch, non-tender.  Ext: No cyanosis, edema, or deformity  Skin: No rash. Normal palpation of skin.   Musculoskeletal: Normal digits and nails by inspection. No clubbing.   Neurologic Examination  Mental status/Cognition: Alert, oriented to self, place, month and year, good attention.  Speech/language: non fluent, hypophonic, answers with 1 word or short phrase, comprehension intact, object naming intact. Cranial nerves:   CN II Pupils equal and reactive to light, no VF deficits   CN III,IV,VI EOM intact, no gaze preference or deviation, no nystagmus    CN V normal sensation in V1, V2, and V3 segments bilaterally    CN VII no asymmetry, no nasolabial fold flattening    CN VIII normal hearing to speech    CN IX & X normal palatal elevation, no uvular deviation    CN XI 5/5 head turn and 5/5 shoulder shrug bilaterally    CN XII midline tongue protrusion    Motor:  Muscle bulk: normal, tone normal, pronator drift none tremor none Mvmt Root Nerve  Muscle Right Left Comments  SA C5/6 Ax Deltoid 5 5   EF C5/6 Mc Biceps 5 5   EE C6/7/8 Rad Triceps 5 5   WF C6/7 Med FCR     WE C7/8 PIN ECU     F  Ab C8/T1 U ADM/FDI 5 5   HF L1/2/3 Fem Illopsoas 5 5   KE L2/3/4 Fem Quad 5 5   DF L4/5 D Peron Tib Ant 5 5    PF S1/2 Tibial Grc/Sol 5 5    Sensation:  Light touch Intact throughout   Pin prick    Temperature    Vibration   Proprioception    Coordination/Complex Motor:  - Finger to Nose intact BL - Heel to shin intact BL - Rapid alternating movement are normal - Gait: deferred.  Labs   CBC:  Recent Labs  Lab 07/24/22 2040  HGB 13.6  HCT 40.0    Basic Metabolic Panel:  Lab Results  Component Value Date   NA 140 07/24/2022   K 4.2 07/24/2022   CO2 25 09/30/2021   GLUCOSE 109 (H) 07/24/2022   BUN 14 07/24/2022   CREATININE 1.10 (H) 07/24/2022   CALCIUM 9.3 09/30/2021   GFRNONAA 61 07/13/2019   GFRAA 71 07/13/2019   Lipid Panel:  Lab Results  Component Value Date   LDLCALC 85 09/12/2021   HgbA1c:  Lab Results  Component Value Date   HGBA1C 5.7 (A) 09/12/2021   Urine Drug Screen: No results found for: "LABOPIA", "COCAINSCRNUR", "LABBENZ", "AMPHETMU", "THCU", "LABBARB"  Alcohol Level No results found for: "ETH"  Impression   Bena Quiram Goynes is a 62 y.o. female with PMH significant for Sickle cell trait, sigmoid diverticulosis, OA who is brought in by EMS for unable to speak.  Initially suspeceted to be functional neurologic deficit as with encouragement, she is able to tell me her name, age, month, and identifies objects and follows commands in all extremities. Speech is hypophonic. Avoids eye contact. No focal deficit with intact strength in all extremities.  However, MRI Brain obtained and demonstrates L frontal ICH about 18ml.  Recommendations  L ACA territory ICH concerning for hemorrhagic stroke with localized brain compression: - Admit to ICU - Stability scan in 6 hours or STAT with any neurological decline - Frequent neuro checks; q52min for 1 hour, then q1hour - No antiplatelets or anticoagulants due to ICH - SCD for DVT prophylaxis, pharmacological DVT ppx at 24 hours if ICH is stable - Blood pressure control with goal systolic 130 - 150, cleverplex and  labetalol PRN - Stroke labs, HgbA1c, fasting lipid panel - CTA head and neck and CT Venogram to evaluate for underlying vascular abnormality. - Risk factor modification - Echocardiogram - PT consult, OT consult, Speech consult. - Stroke team to follow  Asthma: - cont home inhalers  Thank you for the opportunity to take part in the care of this patient. If you have any further questions, please contact the neurology consultation attending.  Signed,  Erick Blinks [facility] Pager Number [phone] _ _ _   _ __   _ __ _ _  __ __   _ __   __ _

## 2022-07-24 NOTE — ED Triage Notes (Signed)
Pt BIB [facility] from home. Pt was daughter's house when EMS was called. Pt came home and went non-verbal. LKW was 1700.   EMS VS BP 156/108 P 80 R 20 O2 100% RA CBG 112

## 2022-07-25 ENCOUNTER — Inpatient Hospital Stay (HOSPITAL_COMMUNITY): Payer: BLUE CROSS/BLUE SHIELD

## 2022-07-25 ENCOUNTER — Other Ambulatory Visit (HOSPITAL_COMMUNITY): Payer: 59

## 2022-07-25 ENCOUNTER — Emergency Department (HOSPITAL_COMMUNITY): Payer: BLUE CROSS/BLUE SHIELD

## 2022-07-25 ENCOUNTER — Inpatient Hospital Stay: Payer: Self-pay

## 2022-07-25 DIAGNOSIS — B9620 Unspecified Escherichia coli [E. coli] as the cause of diseases classified elsewhere: Secondary | ICD-10-CM | POA: Diagnosis not present

## 2022-07-25 DIAGNOSIS — D571 Sickle-cell disease without crisis: Secondary | ICD-10-CM | POA: Diagnosis present

## 2022-07-25 DIAGNOSIS — I6389 Other cerebral infarction: Secondary | ICD-10-CM | POA: Diagnosis not present

## 2022-07-25 DIAGNOSIS — Z885 Allergy status to narcotic agent status: Secondary | ICD-10-CM | POA: Diagnosis not present

## 2022-07-25 DIAGNOSIS — R1310 Dysphagia, unspecified: Secondary | ICD-10-CM | POA: Diagnosis not present

## 2022-07-25 DIAGNOSIS — E872 Acidosis, unspecified: Secondary | ICD-10-CM | POA: Diagnosis not present

## 2022-07-25 DIAGNOSIS — G8191 Hemiplegia, unspecified affecting right dominant side: Secondary | ICD-10-CM | POA: Diagnosis present

## 2022-07-25 DIAGNOSIS — Z96651 Presence of right artificial knee joint: Secondary | ICD-10-CM | POA: Diagnosis present

## 2022-07-25 DIAGNOSIS — E8809 Other disorders of plasma-protein metabolism, not elsewhere classified: Secondary | ICD-10-CM | POA: Diagnosis present

## 2022-07-25 DIAGNOSIS — N390 Urinary tract infection, site not specified: Secondary | ICD-10-CM | POA: Diagnosis not present

## 2022-07-25 DIAGNOSIS — R29717 NIHSS score 17: Secondary | ICD-10-CM | POA: Diagnosis present

## 2022-07-25 DIAGNOSIS — I609 Nontraumatic subarachnoid hemorrhage, unspecified: Secondary | ICD-10-CM | POA: Diagnosis present

## 2022-07-25 DIAGNOSIS — R509 Fever, unspecified: Secondary | ICD-10-CM | POA: Diagnosis not present

## 2022-07-25 DIAGNOSIS — E669 Obesity, unspecified: Secondary | ICD-10-CM | POA: Diagnosis not present

## 2022-07-25 DIAGNOSIS — R4701 Aphasia: Secondary | ICD-10-CM | POA: Diagnosis present

## 2022-07-25 DIAGNOSIS — J45909 Unspecified asthma, uncomplicated: Secondary | ICD-10-CM | POA: Diagnosis present

## 2022-07-25 DIAGNOSIS — R4182 Altered mental status, unspecified: Secondary | ICD-10-CM

## 2022-07-25 DIAGNOSIS — I619 Nontraumatic intracerebral hemorrhage, unspecified: Secondary | ICD-10-CM | POA: Diagnosis present

## 2022-07-25 DIAGNOSIS — E870 Hyperosmolality and hypernatremia: Secondary | ICD-10-CM | POA: Diagnosis not present

## 2022-07-25 DIAGNOSIS — I629 Nontraumatic intracranial hemorrhage, unspecified: Secondary | ICD-10-CM | POA: Diagnosis present

## 2022-07-25 DIAGNOSIS — Z6832 Body mass index (BMI) 32.0-32.9, adult: Secondary | ICD-10-CM | POA: Diagnosis not present

## 2022-07-25 DIAGNOSIS — G936 Cerebral edema: Secondary | ICD-10-CM | POA: Diagnosis present

## 2022-07-25 DIAGNOSIS — I611 Nontraumatic intracerebral hemorrhage in hemisphere, cortical: Secondary | ICD-10-CM | POA: Diagnosis present

## 2022-07-25 DIAGNOSIS — Z886 Allergy status to analgesic agent status: Secondary | ICD-10-CM | POA: Diagnosis not present

## 2022-07-25 DIAGNOSIS — J690 Pneumonitis due to inhalation of food and vomit: Secondary | ICD-10-CM | POA: Diagnosis not present

## 2022-07-25 DIAGNOSIS — E785 Hyperlipidemia, unspecified: Secondary | ICD-10-CM | POA: Diagnosis present

## 2022-07-25 DIAGNOSIS — Z23 Encounter for immunization: Secondary | ICD-10-CM | POA: Diagnosis not present

## 2022-07-25 DIAGNOSIS — Z90710 Acquired absence of both cervix and uterus: Secondary | ICD-10-CM | POA: Diagnosis not present

## 2022-07-25 DIAGNOSIS — N179 Acute kidney failure, unspecified: Secondary | ICD-10-CM | POA: Diagnosis not present

## 2022-07-25 DIAGNOSIS — Z79899 Other long term (current) drug therapy: Secondary | ICD-10-CM | POA: Diagnosis not present

## 2022-07-25 DIAGNOSIS — F419 Anxiety disorder, unspecified: Secondary | ICD-10-CM | POA: Diagnosis present

## 2022-07-25 LAB — LIPID PANEL
Cholesterol: 164 mg/dL (ref 0–200)
HDL: 49 mg/dL (ref >40)
LDL Cholesterol: 105 mg/dL — ABNORMAL HIGH (ref 0–99)
Total CHOL/HDL Ratio: 3.3 RATIO
Triglycerides: 52 mg/dL (ref <150)
VLDL: 10 mg/dL (ref 0–40)

## 2022-07-25 LAB — MRSA NEXT GEN BY PCR, NASAL: MRSA by PCR Next Gen: NOT DETECTED

## 2022-07-25 LAB — GLUCOSE, CAPILLARY
Glucose-Capillary: 126 mg/dL — ABNORMAL HIGH (ref 70–99)
Glucose-Capillary: 146 mg/dL — ABNORMAL HIGH (ref 70–99)
Glucose-Capillary: 90 mg/dL (ref 70–99)
Glucose-Capillary: 131 mg/dL — ABNORMAL HIGH (ref 70–99)
Glucose-Capillary: 147 mg/dL — ABNORMAL HIGH (ref 70–99)
Glucose-Capillary: 154 mg/dL — ABNORMAL HIGH (ref 70–99)

## 2022-07-25 LAB — HEMOGLOBIN A1C
Hgb A1c MFr Bld: 6 % — ABNORMAL HIGH (ref 4.8–5.6)
Mean Plasma Glucose: 125.5 mg/dL

## 2022-07-25 MED ORDER — SENNOSIDES-DOCUSATE SODIUM 8.6-50 MG PO TABS: 1.0000 | ORAL_TABLET | Freq: Two times a day (BID) | ORAL | Status: DC

## 2022-07-25 MED ORDER — IOHEXOL 350 MG/ML SOLN
75.0000 mL | Freq: Once | INTRAVENOUS | Status: AC | PRN
  Administered 2022-07-25: 75 mL via INTRAVENOUS

## 2022-07-25 MED ORDER — SODIUM CHLORIDE 0.9 % IV SOLN: INTRAVENOUS | Status: DC

## 2022-07-25 MED ORDER — STROKE: EARLY STAGES OF RECOVERY BOOK
Freq: Once | Status: AC
  Filled 2022-07-25: qty 1

## 2022-07-25 MED ORDER — ORAL CARE MOUTH RINSE: 15.0000 mL | OROMUCOSAL | Status: DC | PRN

## 2022-07-25 MED ORDER — SODIUM CHLORIDE 0.9% FLUSH: 10.0000 mL | INTRAVENOUS | Status: DC | PRN

## 2022-07-25 MED ORDER — CHLORHEXIDINE GLUCONATE CLOTH 2 % EX PADS
6.0000 | MEDICATED_PAD | Freq: Every day | CUTANEOUS | Status: DC
  Administered 2022-07-25 – 2022-08-09 (×17): 6 via TOPICAL

## 2022-07-25 MED ORDER — ALBUTEROL SULFATE HFA 108 (90 BASE) MCG/ACT IN AERS: 2.0000 | INHALATION_SPRAY | Freq: Four times a day (QID) | RESPIRATORY_TRACT | Status: DC | PRN

## 2022-07-25 MED ORDER — CLEVIDIPINE BUTYRATE 0.5 MG/ML IV EMUL
0.0000 mg/h | INTRAVENOUS | Status: DC
  Administered 2022-07-25: 11 mg/h via INTRAVENOUS
  Administered 2022-07-25: 2 mg/h via INTRAVENOUS
  Administered 2022-07-25: 10 mg/h via INTRAVENOUS
  Administered 2022-07-26: 12 mg/h via INTRAVENOUS
  Administered 2022-07-26: 10 mg/h via INTRAVENOUS
  Administered 2022-07-26 (×2): 12 mg/h via INTRAVENOUS
  Administered 2022-07-26: 14 mg/h via INTRAVENOUS
  Administered 2022-07-26: 12 mg/h via INTRAVENOUS
  Administered 2022-07-27: 8 mg/h via INTRAVENOUS
  Filled 2022-07-25 (×10): qty 100

## 2022-07-25 MED ORDER — PANTOPRAZOLE SODIUM 40 MG IV SOLR
40.0000 mg | Freq: Every day | INTRAVENOUS | Status: DC
  Administered 2022-07-25 – 2022-08-16 (×23): 40 mg via INTRAVENOUS
  Filled 2022-07-25 (×23): qty 10

## 2022-07-25 MED ORDER — ALBUTEROL SULFATE (2.5 MG/3ML) 0.083% IN NEBU: 2.5000 mg | INHALATION_SOLUTION | Freq: Four times a day (QID) | RESPIRATORY_TRACT | Status: DC | PRN

## 2022-07-25 MED ORDER — SODIUM CHLORIDE 0.9% FLUSH
10.0000 mL | Freq: Two times a day (BID) | INTRAVENOUS | Status: DC
  Administered 2022-07-26 – 2022-08-01 (×13): 10 mL
  Administered 2022-08-02: 20 mL
  Administered 2022-08-02 – 2022-08-16 (×28): 10 mL
  Administered 2022-08-16: 20 mL
  Administered 2022-08-17 (×2): 10 mL

## 2022-07-25 MED ORDER — PNEUMOCOCCAL 20-VAL CONJ VACC 0.5 ML IM SUSY
0.5000 mL | PREFILLED_SYRINGE | INTRAMUSCULAR | Status: AC
  Administered 2022-07-26: 0.5 mL via INTRAMUSCULAR
  Filled 2022-07-25: qty 0.5

## 2022-07-25 MED ORDER — ORAL CARE MOUTH RINSE
15.0000 mL | OROMUCOSAL | Status: DC
  Administered 2022-07-25 – 2022-08-10 (×67): 15 mL via OROMUCOSAL

## 2022-07-25 MED ORDER — LORAZEPAM 2 MG/ML IJ SOLN
2.0000 mg | Freq: Once | INTRAMUSCULAR | Status: AC
  Administered 2022-07-25: 2 mg via INTRAVENOUS
  Filled 2022-07-25: qty 1

## 2022-07-25 MED ORDER — LABETALOL HCL 5 MG/ML IV SOLN
10.0000 mg | INTRAVENOUS | Status: DC | PRN
  Administered 2022-07-27 – 2022-07-29 (×2): 10 mg via INTRAVENOUS
  Filled 2022-07-25 (×3): qty 4

## 2022-07-25 NOTE — Plan of Care (Signed)
Called by RN that with stimulation and family presence, pt became agitated and tried to get out of bed. It is about time for repeat CT head to access the hematoma stability, will give ativan 2mg  iv prior to CT.   Marvel Plan, MD PhD Stroke Neurology 07/25/2022 6:24 PM

## 2022-07-25 NOTE — ED Provider Notes (Signed)
[facility] Provider Note   CSN: [id number] Arrival date & time: 07/24/22  2033     History  Chief Complaint  Patient presents with   Altered Mental Status    Anna Berry is a 62 y.o. female.  The history is provided by the patient and medical records. The history is limited by the condition of the patient. No language interpreter was used.  Altered Mental Status Presenting symptoms: partial responsiveness   Severity:  Severe Most recent episode:  Today Episode history:  Unable to specify Timing:  Constant Progression:  Unchanged Associated symptoms: no abdominal pain, no agitation, no fever, no headaches, no light-headedness, no nausea, no vomiting and no weakness        Home Medications Prior to Admission medications   Medication Sig Start Date End Date Taking? Authorizing Provider  acetaminophen (TYLENOL) 500 MG tablet Take 1,000 mg by mouth daily as needed for moderate pain.   Yes [provider]  albuterol (VENTOLIN HFA) 108 (90 Base) MCG/ACT inhaler Inhale 2 puffs into the lungs every 6 (six) hours as needed for wheezing or shortness of breath. 06/26/22  Yes Maury Dus, MD  loratadine (CLARITIN) 10 MG tablet Take 10 mg by mouth daily as needed for allergies.    Yes [provider]  nitroGLYCERIN (NITRODUR - DOSED IN MG/24 HR) 0.2 mg/hr patch Apply 1/4th patch to affected shoulder, change daily 09/29/21  Yes Hudnall, Azucena Fallen, MD  polyethylene glycol powder (GLYCOLAX/MIRALAX) powder Take 17 g by mouth 2 (two) times daily as needed. Patient taking differently: Take 17 g by mouth 2 (two) times daily as needed for mild constipation. 03/01/14  Yes Street, Stephanie Coup, MD  Tetrahydrozoline HCl (VISINE OP) Place 1 drop into both eyes daily as needed (allergies).   Yes [provider]      Allergies    Acetaminophen, Morphine and related, Nsaids, and Other    Review of Systems   Review of Systems  Reason unable to  perform ROS: Patient shook her head no to most of the review of systems but intermittently would whisper answers.  Constitutional:  Negative for appetite change, chills, fatigue and fever.  HENT:  Negative for congestion.   Eyes:  Negative for visual disturbance.  Respiratory:  Negative for cough, chest tightness and shortness of breath.   Gastrointestinal:  Negative for abdominal pain, constipation, diarrhea, nausea and vomiting.  Genitourinary:  Negative for dysuria.  Musculoskeletal:  Negative for back pain.  Neurological:  Positive for speech difficulty. Negative for weakness, light-headedness and headaches.  Psychiatric/Behavioral:  Negative for agitation.   All other systems reviewed and are negative.   Physical Exam Updated Vital Signs BP 110/61   Pulse (!) 102   Temp 98.4 F (36.9 C) (Oral)   Resp 17   Ht 5\' 6"  (1.676 m)   Wt 90.4 kg   SpO2 95%   BMI 32.17 kg/m  Physical Exam Vitals and nursing note reviewed.  Constitutional:      General: She is not in acute distress.    Appearance: She is well-developed. She is not ill-appearing, toxic-appearing or diaphoretic.  HENT:     Head: Normocephalic and atraumatic.     Nose: No congestion or rhinorrhea.     Mouth/Throat:     Mouth: Mucous membranes are moist.  Eyes:     Extraocular Movements: Extraocular movements intact.     Conjunctiva/sclera: Conjunctivae normal.     Pupils: Pupils are equal, round, and  reactive to light.  Neck:     Vascular: No carotid bruit.  Cardiovascular:     Rate and Rhythm: Normal rate and regular rhythm.     Heart sounds: No murmur heard. Pulmonary:     Effort: Pulmonary effort is normal. No respiratory distress.     Breath sounds: Normal breath sounds. No wheezing, rhonchi or rales.  Chest:     Chest wall: No tenderness.  Abdominal:     Palpations: Abdomen is soft.     Tenderness: There is no abdominal tenderness. There is no guarding or rebound.  Musculoskeletal:        General: No  swelling or tenderness.     Cervical back: Neck supple. No tenderness.     Right lower leg: No edema.     Left lower leg: No edema.  Skin:    General: Skin is warm and dry.     Capillary Refill: Capillary refill takes less than 2 seconds.  Neurological:     Mental Status: She is alert.     Sensory: No sensory deficit.     Motor: No weakness.     ED Results / Procedures / Treatments   Labs (all labs ordered are listed, but only abnormal results are displayed) Labs Reviewed  APTT - Abnormal; Notable for the following components:      Result Value   aPTT 23 (*)    All other components within normal limits  CBC - Abnormal; Notable for the following components:   WBC 11.3 (*)    All other components within normal limits  DIFFERENTIAL - Abnormal; Notable for the following components:   Neutro Abs 8.6 (*)    All other components within normal limits  COMPREHENSIVE METABOLIC PANEL - Abnormal; Notable for the following components:   Glucose, Bld 107 (*)    Creatinine, Ser 1.12 (*)    GFR, Estimated 55 (*)    All other components within normal limits  HEMOGLOBIN A1C - Abnormal; Notable for the following components:   Hgb A1c MFr Bld 6.0 (*)    All other components within normal limits  LIPID PANEL - Abnormal; Notable for the following components:   LDL Cholesterol 105 (*)    All other components within normal limits  GLUCOSE, CAPILLARY - Abnormal; Notable for the following components:   Glucose-Capillary 126 (*)    All other components within normal limits  GLUCOSE, CAPILLARY - Abnormal; Notable for the following components:   Glucose-Capillary 146 (*)    All other components within normal limits  I-STAT CHEM 8, ED - Abnormal; Notable for the following components:   Creatinine, Ser 1.10 (*)    Glucose, Bld 109 (*)    Calcium, Ion 0.94 (*)    All other components within normal limits  CBG MONITORING, ED - Abnormal; Notable for the following components:   Glucose-Capillary 118  (*)    All other components within normal limits  MRSA NEXT GEN BY PCR, NASAL  PROTIME-INR  ETHANOL  GLUCOSE, CAPILLARY  RAPID URINE DRUG SCREEN, HOSP PERFORMED  I-STAT BETA HCG BLOOD, ED ([facility], [facility], [facility] ONLY)    EKG EKG Interpretation  Date/Time:  Friday Jul 24 2022 20:42:45 EDT Ventricular Rate:  77 PR Interval:  219 QRS Duration: 93 QT Interval:  362 QTC Calculation: 410 R Axis:   90 Text Interpretation: Right and left arm electrode reversal, interpretation assumes no reversal Sinus rhythm Consider left atrial enlargement Borderline right axis deviation Consider left ventricular hypertrophy when compared to  prior, similar appearance. No STEMI Confirmed by Theda Belfast ([id number]) on 07/24/2022 9:09:35 PM  Radiology [place] EKG SITE RITE  Result Date: 07/25/2022 If Site Rite image not attached, placement could not be confirmed due to current cardiac rhythm.  CT HEAD WO CONTRAST  Result Date: 07/25/2022 CLINICAL DATA:  Stroke follow-up EXAM: CT HEAD WITHOUT CONTRAST TECHNIQUE: Contiguous axial images were obtained from the base of the skull through the vertex without intravenous contrast. RADIATION DOSE REDUCTION: This exam was performed according to the departmental dose-optimization program which includes automated exposure control, adjustment of the mA and/or kV according to patient size and/or use of iterative reconstruction technique. COMPARISON:  CT and CTA from earlier today FINDINGS: Brain: Superior left frontal/parasagittal hematoma with progression in the adjacent frontal white matter. Hemorrhage is seen in both the parenchymal and subarachnoid compartments with mild brain edema. The area of newly affected brain had a normal appearance on the prior brain MRI. The CTV adn MRI are again reviewed and the superior sagittal sinus is patent and there is no detectable cortical vein thrombus. The new area of hemorrhage measures 3.4 x 1.8 x 1.7 cm. Rightward midline shift measures 5 mm. No  hydrocephalus. Vascular: No hyperdense vessel or unexpected calcification. Skull: Normal. Negative for fracture or focal lesion. Sinuses/Orbits: No acute finding. Dr. Roda Shutters is aware of the findings at time of signing. IMPRESSION: Progression of left frontal ICH with new hematoma lateral to the presenting hematoma in the frontal lobe. Midline shift now measures 5 mm. Electronically Signed   By: Tiburcio Pea M.D.   On: 07/25/2022 09:37   CT VENOGRAM HEAD  Result Date: 07/25/2022 CLINICAL DATA:  ICH EXAM: CT VENOGRAM HEAD TECHNIQUE: Venographic phase images of the brain were obtained following the administration of intravenous contrast. Multiplanar reformats and maximum intensity projections were generated. RADIATION DOSE REDUCTION: This exam was performed according to the departmental dose-optimization program which includes automated exposure control, adjustment of the mA and/or kV according to patient size and/or use of iterative reconstruction technique. CONTRAST:  75mL OMNIPAQUE IOHEXOL 350 MG/ML SOLN COMPARISON:  Head CT and brain MRI from earlier today FINDINGS: No dural venous sinus thrombosis. The superior sagittal sinus is diffusely patent, including adjacent to the known left frontal hemorrhage. Deep and cortical veins also show no signs of thrombosis. The left transverse sigmoid outflow is dominant without stricture or other filling defect seen. No masslike enhancement at the hematoma. Follow-up after hemorrhage resolution may be useful in detecting compressed vascular lesion. IMPRESSION: Negative for dural venous sinus thrombosis. No vascular lesion seen underlying the hemorrhage. Electronically Signed   By: Tiburcio Pea M.D.   On: 07/25/2022 05:10   CT ANGIO HEAD NECK W WO CM  Result Date: 07/25/2022 CLINICAL DATA:  Hemorrhagic stroke. EXAM: CT ANGIOGRAPHY HEAD AND NECK WITH AND WITHOUT CONTRAST TECHNIQUE: Multidetector CT imaging of the head and neck was performed using the standard protocol  during bolus administration of intravenous contrast. Multiplanar CT image reconstructions and MIPs were obtained to evaluate the vascular anatomy. Carotid stenosis measurements (when applicable) are obtained utilizing NASCET criteria, using the distal internal carotid diameter as the denominator. RADIATION DOSE REDUCTION: This exam was performed according to the departmental dose-optimization program which includes automated exposure control, adjustment of the mA and/or kV according to patient size and/or use of iterative reconstruction technique. CONTRAST:  75mL OMNIPAQUE IOHEXOL 350 MG/ML SOLN COMPARISON:  Head CT from earlier today FINDINGS: CTA NECK FINDINGS Aortic arch: 2 vessel branching. Right carotid system: Vessels  are smoothly contoured and diffusely patent Left carotid system: Vessels are smoothly contoured and diffusely patent Vertebral arteries: No vertebral or subclavian stenosis or ulceration. Skeleton: Generalized cervical spine degeneration. Other neck: Negative Upper chest: Negative Review of the MIP images confirms the above findings CTA HEAD FINDINGS Anterior circulation: No spot sign, aneurysm, or signs of vascular malformation underlying the acute hemorrhage. Major vessels are widely patent and smoothly contoured. Posterior circulation: No significant stenosis, proximal occlusion, aneurysm, or vascular malformation. Venous sinuses: Separate CT venogram. No dural venous sinus thrombosis or early enhancement. Anatomic variants: None significant Review of the MIP images confirms the above findings IMPRESSION: No vascular lesion or spot sign seen at the left frontal hemorrhage. Electronically Signed   By: Tiburcio Pea M.D.   On: 07/25/2022 05:08   CT Head Wo Contrast  Result Date: 07/25/2022 CLINICAL DATA:  Intracranial hemorrhage EXAM: CT HEAD WITHOUT CONTRAST TECHNIQUE: Contiguous axial images were obtained from the base of the skull through the vertex without intravenous contrast.  RADIATION DOSE REDUCTION: This exam was performed according to the departmental dose-optimization program which includes automated exposure control, adjustment of the mA and/or kV according to patient size and/or use of iterative reconstruction technique. COMPARISON:  None Available. FINDINGS: Brain: Large acute intraparenchymal hematoma of the anterior superior left frontal lobe with overlying subarachnoid blood. Volume is approximately 18 mL. The hematoma is located within the left ACA territory. No midline shift or other mass effect. Vascular: No abnormal hyperdensity of the major intracranial arteries or dural venous sinuses. No intracranial atherosclerosis. Skull: The visualized skull base, calvarium and extracranial soft tissues are normal. Sinuses/Orbits: No fluid levels or advanced mucosal thickening of the visualized paranasal sinuses. No mastoid or middle ear effusion. The orbits are normal. IMPRESSION: 1. Large acute intraparenchymal hematoma of the anterior superior left frontal lobe with overlying subarachnoid blood. Volume is approximately 18 mL. 2. No midline shift or other mass effect. Electronically Signed   By: Deatra Robinson M.D.   On: 07/25/2022 03:29   MR BRAIN WO CONTRAST  Result Date: 07/25/2022 CLINICAL DATA:  Altered mental status EXAM: MRI HEAD WITHOUT CONTRAST TECHNIQUE: Multiplanar, multiecho pulse sequences of the brain and surrounding structures were obtained without intravenous contrast. COMPARISON:  None Available. FINDINGS: Brain: There is a large area of hemorrhage (approximately 7.0 x 2.0 x 2.5 cm) in the left frontal lobe, within the anterior cerebral artery territory. Mild surrounding edema. No visible acute ischemia, though susceptibility effects from the left frontal blood cause artifacts on the diffusion-weighted imaging. Normal white matter signal, parenchymal volume and CSF spaces. The midline structures are normal. Vascular: Major flow voids are preserved. Skull and  upper cervical spine: Normal calvarium and skull base. Visualized upper cervical spine and soft tissues are normal. Sinuses/Orbits:No paranasal sinus fluid levels or advanced mucosal thickening. No mastoid or middle ear effusion. Normal orbits. IMPRESSION: Large area of hemorrhage (approximately 18 mL) in the left frontal lobe, within the anterior cerebral artery territory. The distribution raises suspicion for hemorrhagic conversion and ACA territory infarct. Head CT is recommended. Critical Value/emergent results were called by telephone at the time of interpretation on 07/25/2022 at 2:47 am to Dr. Preston Fleeting, who verbally acknowledged these results. Electronically Signed   By: Deatra Robinson M.D.   On: 07/25/2022 02:47    Procedures Procedures    Medications Ordered in ED Medications   stroke: early stages of recovery book (has no administration in time range)  senna-docusate (Senokot-S) tablet 1 tablet (1 tablet Oral  Not Given 07/25/22 0837)  pantoprazole (PROTONIX) injection 40 mg (has no administration in time range)  albuterol (PROVENTIL) (2.5 MG/3ML) 0.083% nebulizer solution 2.5 mg (has no administration in time range)  Oral care mouth rinse (15 mLs Mouth Rinse Given 07/25/22 1200)  Oral care mouth rinse (has no administration in time range)  Chlorhexidine Gluconate Cloth 2 % PADS 6 each (6 each Topical Given 07/25/22 0610)  labetalol (NORMODYNE) injection 10 mg (has no administration in time range)  Oral care mouth rinse (has no administration in time range)  pneumococcal 20-valent conjugate vaccine (PREVNAR 20) injection 0.5 mL (has no administration in time range)  0.9 %  sodium chloride infusion ( Intravenous Infusion Verify 07/25/22 1400)  clevidipine (CLEVIPREX) infusion 0.5 mg/mL (13 mg/hr Intravenous Infusion Verify 07/25/22 1400)  sodium chloride flush (NS) 0.9 % injection 10-40 mL (has no administration in time range)  sodium chloride flush (NS) 0.9 % injection 10-40 mL (has no  administration in time range)  sodium chloride flush (NS) 0.9 % injection 3 mL (3 mLs Intravenous Given 07/24/22 2109)  LORazepam (ATIVAN) injection 0.5 mg (0.5 mg Intravenous Given 07/24/22 2109)  iohexol (OMNIPAQUE) 350 MG/ML injection 75 mL (75 mLs Intravenous Contrast Given 07/25/22 0451)    ED Course/ Medical Decision Making/ A&P                             Medical Decision Making Amount and/or Complexity of Data Reviewed Radiology: ordered.  Risk Decision regarding hospitalization.    Kendal Jervey Bayle is a 62 y.o. female who presents as a code stroke for speech abnormality and altered mental status.  According to neurology report he was able to speak with EMS, patient was last normal at 5 PM after getting home and then became minimally verbal.  Patient did not have any reported seizures but was not engaging in conversation.  Patient was sitting quietly.  Patient was not initially assessed at the [facility] and in her room by neurology and they decided to call off the code stroke given her lack of focality initially.  Plan was to get lab workup and assess to determine if she needs imaging.  On my initial evaluation, patient is not answering questions.  She occasionally will whisper things.  She was having symmetric smile and had intact sensation and strength in extremities initially.  Patient intermittently would answer questions before neurology but did not want to speak to me.  Lungs clear and chest nontender.  Abdomen nontender.  No evidence of acute trauma.  Due to her continued difficulty answering questions, we agreed to get an MRI to further evaluate.  Blood pressure initially not critically elevated.  Plan of care will be to get the MRI to determine disposition.  Neurology will continue to follow.  If MRI is reassuring, neurology felt this is likely a psychiatric issue and TTS should be consulted.  Will await MRI results to determine disposition.         Final Clinical  Impression(s) / ED Diagnoses Final diagnoses:  Cerebral hemorrhage (HCC)     Clinical Impression: 1. Cerebral hemorrhage (HCC)     Disposition: Admit  This note was prepared with assistance of Dragon voice recognition software. Occasional wrong-word or sound-a-like substitutions may have occurred due to the inherent limitations of voice recognition software.      Janaa Acero, Canary Brim, MD 07/25/22 [phone]

## 2022-07-25 NOTE — Progress Notes (Addendum)
NIH of 17. Per night nurse Pt has been waxing and waning. Previous NIH has been variable with 6-18 between ED and 0700 this AM. Pt arrived on Unit 0530. Notified Neuro secure chat.

## 2022-07-25 NOTE — ED Notes (Signed)
Pt gone to MRI 

## 2022-07-25 NOTE — Progress Notes (Signed)
EEG complete - results pending 

## 2022-07-25 NOTE — Progress Notes (Signed)
Peripherally Inserted Central Catheter Placement  The IV Nurse has discussed with the patient and/or persons authorized to consent for the patient, the purpose of this procedure and the potential benefits and risks involved with this procedure.  The benefits include less needle sticks, lab draws from the catheter, and the patient may be discharged home with the catheter. Risks include, but not limited to, infection, bleeding, blood clot (thrombus formation), and puncture of an artery; nerve damage and irregular heartbeat and possibility to perform a PICC exchange if needed/ordered by physician.  Alternatives to this procedure were also discussed.  Bard Power PICC patient education guide, fact sheet on infection prevention and patient information card has been provided to patient /or left at bedside.    PICC Placement Documentation  PICC Double Lumen 07/25/22 Left Basilic 39 cm 1 cm (Active)  Indication for Insertion or Continuance of Line Limited venous access - need for IV therapy >5 days (PICC only) 07/25/22 1428  Exposed Catheter (cm) 0 cm 07/25/22 1428  Site Assessment Clean, Dry, Intact 07/25/22 1428  Lumen #1 Status Saline locked;Flushed;Blood return noted 07/25/22 1428  Lumen #2 Status Saline locked;Flushed;Blood return noted 07/25/22 1428  Dressing Type Transparent;Securing device 07/25/22 1428  Dressing Status Antimicrobial disc in place;Clean, Dry, Intact 07/25/22 1428  Safety Lock Not Applicable 07/25/22 1428  Line Care Connections checked and tightened 07/25/22 1428  Line Adjustment (NICU/IV Team Only) No 07/25/22 1428  Dressing Intervention New dressing 07/25/22 1428  Dressing Change Due 08/01/22 07/25/22 1428   LUE selected for PICC due to R sided weakness.    [name] 07/25/2022, 2:29 PM

## 2022-07-25 NOTE — Progress Notes (Signed)
SLP Cancellation Note  Patient Details Name: Anna Berry MRN: [id number] DOB: Jan 14, 1961   Cancelled treatment:       Reason Eval/Treat Not Completed: Patient at procedure or test/unavailable (Leaving floor for stat head CT due to neuro changes.)  Ferdinand Lango MA, CCC-SLP  Anna Berry 07/25/2022, 8:51 AM

## 2022-07-25 NOTE — ED Provider Notes (Signed)
Call received from radiology, MR brain shows a left frontal cerebral hemorrhage of uncertain age with recommendation to get CT scan to help further delineate how old the hemorrhage is.  I have ordered the CT scan.  I have independently viewed the MRI images and agree with the radiologist's interpretation.  CT shows acute hemorrhage in the same location.  I discussed the case with Dr. Derry Lory of neurology service who agrees to admit the patient.  Results for orders placed or performed during the hospital encounter of 07/24/22  Protime-INR  Result Value Ref Range   Prothrombin Time 14.2 11.4 - 15.2 seconds   INR 1.1 0.8 - 1.2  APTT  Result Value Ref Range   aPTT 23 (L) 24 - 36 seconds  CBC  Result Value Ref Range   WBC 11.3 (H) 4.0 - 10.5 K/uL   RBC 4.72 3.87 - 5.11 MIL/uL   Hemoglobin 13.3 12.0 - 15.0 g/dL   HCT 16.1 09.6 - 04.5 %   MCV 90.0 80.0 - 100.0 fL   MCH 28.2 26.0 - 34.0 pg   MCHC 31.3 30.0 - 36.0 g/dL   RDW 40.9 81.1 - 91.4 %   Platelets 268 150 - 400 K/uL   nRBC 0.0 0.0 - 0.2 %  Differential  Result Value Ref Range   Neutrophils Relative % 76 %   Neutro Abs 8.6 (H) 1.7 - 7.7 K/uL   Lymphocytes Relative 17 %   Lymphs Abs 1.9 0.7 - 4.0 K/uL   Monocytes Relative 4 %   Monocytes Absolute 0.5 0.1 - 1.0 K/uL   Eosinophils Relative 2 %   Eosinophils Absolute 0.2 0.0 - 0.5 K/uL   Basophils Relative 1 %   Basophils Absolute 0.1 0.0 - 0.1 K/uL   Immature Granulocytes 0 %   Abs Immature Granulocytes 0.03 0.00 - 0.07 K/uL  Comprehensive metabolic panel  Result Value Ref Range   Sodium 140 135 - 145 mmol/L   Potassium 4.3 3.5 - 5.1 mmol/L   Chloride 105 98 - 111 mmol/L   CO2 24 22 - 32 mmol/L   Glucose, Bld 107 (H) 70 - 99 mg/dL   BUN 10 8 - 23 mg/dL   Creatinine, Ser 7.82 (H) 0.44 - 1.00 mg/dL   Calcium 9.5 8.9 - 95.6 mg/dL   Total Protein 7.2 6.5 - 8.1 g/dL   Albumin 4.2 3.5 - 5.0 g/dL   AST 27 15 - 41 U/L   ALT 19 0 - 44 U/L   Alkaline Phosphatase 79 38 - 126  U/L   Total Bilirubin 0.5 0.3 - 1.2 mg/dL   GFR, Estimated 55 (L) >60 mL/min   Anion gap 11 5 - 15  Ethanol  Result Value Ref Range   Alcohol, Ethyl (B) <10 <10 mg/dL  I-stat chem 8, ED  Result Value Ref Range   Sodium 140 135 - 145 mmol/L   Potassium 4.2 3.5 - 5.1 mmol/L   Chloride 108 98 - 111 mmol/L   BUN 14 8 - 23 mg/dL   Creatinine, Ser 2.13 (H) 0.44 - 1.00 mg/dL   Glucose, Bld 086 (H) 70 - 99 mg/dL   Calcium, Ion 5.78 (L) 1.15 - 1.40 mmol/L   TCO2 27 22 - 32 mmol/L   Hemoglobin 13.6 12.0 - 15.0 g/dL   HCT 46.9 62.9 - 52.8 %  CBG monitoring, ED  Result Value Ref Range   Glucose-Capillary 118 (H) 70 - 99 mg/dL  I-Stat beta hCG blood, ED ([facility], [facility], [facility]  only)  Result Value Ref Range   I-stat hCG, quantitative <5.0 <5 mIU/mL   Comment 3           CT Head Wo Contrast  Result Date: 07/25/2022 CLINICAL DATA:  Intracranial hemorrhage EXAM: CT HEAD WITHOUT CONTRAST TECHNIQUE: Contiguous axial images were obtained from the base of the skull through the vertex without intravenous contrast. RADIATION DOSE REDUCTION: This exam was performed according to the departmental dose-optimization program which includes automated exposure control, adjustment of the mA and/or kV according to patient size and/or use of iterative reconstruction technique. COMPARISON:  None Available. FINDINGS: Brain: Large acute intraparenchymal hematoma of the anterior superior left frontal lobe with overlying subarachnoid blood. Volume is approximately 18 mL. The hematoma is located within the left ACA territory. No midline shift or other mass effect. Vascular: No abnormal hyperdensity of the major intracranial arteries or dural venous sinuses. No intracranial atherosclerosis. Skull: The visualized skull base, calvarium and extracranial soft tissues are normal. Sinuses/Orbits: No fluid levels or advanced mucosal thickening of the visualized paranasal sinuses. No mastoid or middle ear effusion. The orbits are normal.  IMPRESSION: 1. Large acute intraparenchymal hematoma of the anterior superior left frontal lobe with overlying subarachnoid blood. Volume is approximately 18 mL. 2. No midline shift or other mass effect. Electronically Signed   By: Deatra Robinson M.D.   On: 07/25/2022 03:29   MR BRAIN WO CONTRAST  Result Date: 07/25/2022 CLINICAL DATA:  Altered mental status EXAM: MRI HEAD WITHOUT CONTRAST TECHNIQUE: Multiplanar, multiecho pulse sequences of the brain and surrounding structures were obtained without intravenous contrast. COMPARISON:  None Available. FINDINGS: Brain: There is a large area of hemorrhage (approximately 7.0 x 2.0 x 2.5 cm) in the left frontal lobe, within the anterior cerebral artery territory. Mild surrounding edema. No visible acute ischemia, though susceptibility effects from the left frontal blood cause artifacts on the diffusion-weighted imaging. Normal white matter signal, parenchymal volume and CSF spaces. The midline structures are normal. Vascular: Major flow voids are preserved. Skull and upper cervical spine: Normal calvarium and skull base. Visualized upper cervical spine and soft tissues are normal. Sinuses/Orbits:No paranasal sinus fluid levels or advanced mucosal thickening. No mastoid or middle ear effusion. Normal orbits. IMPRESSION: Large area of hemorrhage (approximately 18 mL) in the left frontal lobe, within the anterior cerebral artery territory. The distribution raises suspicion for hemorrhagic conversion and ACA territory infarct. Head CT is recommended. Critical Value/emergent results were called by telephone at the time of interpretation on 07/25/2022 at 2:47 am to Dr. Preston Fleeting, who verbally acknowledged these results. Electronically Signed   By: Deatra Robinson M.D.   On: 07/25/2022 02:47      Dione Booze, MD 07/25/22 [phone]

## 2022-07-25 NOTE — Procedures (Signed)
Routine EEG Report  Anna Berry is a 62 y.o. female with a history of intracranial hemorrhage and altered mental status who is undergoing an EEG to evaluate for seizures.  Report: This EEG was acquired with electrodes placed according to the International 10-20 electrode system (including Fp1, Fp2, F3, F4, C3, C4, P3, P4, O1, O2, T3, T4, T5, T6, A1, A2, Fz, Cz, Pz). The following electrodes were missing or displaced: none.  The occipital dominant rhythm was 7 Hz. This activity is reactive to stimulation. Drowsiness was manifested by background fragmentation; deeper stages of sleep were not identified. There was focal slowing over the left frontal region. There were no interictal epileptiform discharges. There were no electrographic seizures identified. Photic stimulation and hyperventilation were not performed.  Impression and clinical correlation: This EEG was obtained while awake and drowsy and is abnormal due to mild diffuse slowing indicative of global cerebral dysfunction and left frontal slowing over the area of intracranial hemorrhage. Epileptiform abnormalities were not seen during this recording.  Bing Neighbors, MD [facility] [phone]  If 7pm- 7am, please page neurology on call as listed in AMION.

## 2022-07-25 NOTE — Progress Notes (Signed)
Patient has stroke and weakness site on Rt side. Patient is not responded verbal order or shake. One PIV access on Rt. AC at this time which is not recommended area. Assess on Lt. Side arm which AC was infiltrated on. Suitable vein is only upper side one, lower is no suitable veins. Talked to patient's RN and Neurologist regarding this matter. They agreed to put in the PICC at this time. HS [employer]

## 2022-07-25 NOTE — Progress Notes (Addendum)
STROKE TEAM PROGRESS NOTE   SUBJECTIVE (INTERVAL HISTORY) Her RN is at the bedside.  Per RN, pt was reported following commands and able to read stroke NIHSS card with night shift. But this am, drowsy and hard to arouse. Therefore CT repeat was done and showed new area of ICH lateral to current ICH with MLS 5mm. Pt BP stable and overnight MRI and CTA or CTV no good explanation.    OBJECTIVE Temp:  [97.9 F (36.6 C)-98.9 F (37.2 C)] 97.9 F (36.6 C) (05/11 0712) Pulse Rate:  [60-99] 64 (05/11 1015) Resp:  [14-24] 16 (05/11 1015) BP: (97-155)/(62-125) 128/73 (05/11 1015) SpO2:  [93 %-100 %] 97 % (05/11 1015) Weight:  [90.4 kg] 90.4 kg (05/11 0611)  Recent Labs  Lab 07/24/22 2034 07/25/22 0550 07/25/22 0710  GLUCAP 118* 126* 146*   Recent Labs  Lab 07/24/22 2035 07/24/22 2040  NA 140 140  K 4.3 4.2  CL 105 108  CO2 24  --   GLUCOSE 107* 109*  BUN 10 14  CREATININE 1.12* 1.10*  CALCIUM 9.5  --    Recent Labs  Lab 07/24/22 2035  AST 27  ALT 19  ALKPHOS 79  BILITOT 0.5  PROT 7.2  ALBUMIN 4.2   Recent Labs  Lab 07/24/22 2035 07/24/22 2040  WBC 11.3*  --   NEUTROABS 8.6*  --   HGB 13.3 13.6  HCT 42.5 40.0  MCV 90.0  --   PLT 268  --    No results for input(s): "CKTOTAL", "CKMB", "CKMBINDEX", "TROPONINI" in the last 168 hours. Recent Labs    07/24/22 2035  LABPROT 14.2  INR 1.1   No results for input(s): "COLORURINE", "LABSPEC", "PHURINE", "GLUCOSEU", "HGBUR", "BILIRUBINUR", "KETONESUR", "PROTEINUR", "UROBILINOGEN", "NITRITE", "LEUKOCYTESUR" in the last 72 hours.  Invalid input(s): "APPERANCEUR"     Component Value Date/Time   CHOL 164 07/25/2022 0623   CHOL 155 09/12/2021 1509   TRIG 52 07/25/2022 0623   HDL 49 07/25/2022 0623   HDL 46 09/12/2021 1509   CHOLHDL 3.3 07/25/2022 0623   VLDL 10 07/25/2022 0623   LDLCALC 105 (H) 07/25/2022 0623   LDLCALC 85 09/12/2021 1509   Lab Results  Component Value Date   HGBA1C 6.0 (H) 07/25/2022   No  results found for: "LABOPIA", "COCAINSCRNUR", "LABBENZ", "AMPHETMU", "THCU", "LABBARB"  Recent Labs  Lab 07/24/22 2035  ETH <10    I have personally reviewed the radiological images below and agree with the radiology interpretations.  [place] EKG SITE RITE  Result Date: 07/25/2022 If Site Rite image not attached, placement could not be confirmed due to current cardiac rhythm.  CT HEAD WO CONTRAST  Result Date: 07/25/2022 CLINICAL DATA:  Stroke follow-up EXAM: CT HEAD WITHOUT CONTRAST TECHNIQUE: Contiguous axial images were obtained from the base of the skull through the vertex without intravenous contrast. RADIATION DOSE REDUCTION: This exam was performed according to the departmental dose-optimization program which includes automated exposure control, adjustment of the mA and/or kV according to patient size and/or use of iterative reconstruction technique. COMPARISON:  CT and CTA from earlier today FINDINGS: Brain: Superior left frontal/parasagittal hematoma with progression in the adjacent frontal white matter. Hemorrhage is seen in both the parenchymal and subarachnoid compartments with mild brain edema. The area of newly affected brain had a normal appearance on the prior brain MRI. The CTV adn MRI are again reviewed and the superior sagittal sinus is patent and there is no detectable cortical vein thrombus. The new area of hemorrhage  measures 3.4 x 1.8 x 1.7 cm. Rightward midline shift measures 5 mm. No hydrocephalus. Vascular: No hyperdense vessel or unexpected calcification. Skull: Normal. Negative for fracture or focal lesion. Sinuses/Orbits: No acute finding. Dr. Roda Shutters is aware of the findings at time of signing. IMPRESSION: Progression of left frontal ICH with new hematoma lateral to the presenting hematoma in the frontal lobe. Midline shift now measures 5 mm. Electronically Signed   By: Tiburcio Pea M.D.   On: 07/25/2022 09:37   CT VENOGRAM HEAD  Result Date: 07/25/2022 CLINICAL DATA:  ICH  EXAM: CT VENOGRAM HEAD TECHNIQUE: Venographic phase images of the brain were obtained following the administration of intravenous contrast. Multiplanar reformats and maximum intensity projections were generated. RADIATION DOSE REDUCTION: This exam was performed according to the departmental dose-optimization program which includes automated exposure control, adjustment of the mA and/or kV according to patient size and/or use of iterative reconstruction technique. CONTRAST:  75mL OMNIPAQUE IOHEXOL 350 MG/ML SOLN COMPARISON:  Head CT and brain MRI from earlier today FINDINGS: No dural venous sinus thrombosis. The superior sagittal sinus is diffusely patent, including adjacent to the known left frontal hemorrhage. Deep and cortical veins also show no signs of thrombosis. The left transverse sigmoid outflow is dominant without stricture or other filling defect seen. No masslike enhancement at the hematoma. Follow-up after hemorrhage resolution may be useful in detecting compressed vascular lesion. IMPRESSION: Negative for dural venous sinus thrombosis. No vascular lesion seen underlying the hemorrhage. Electronically Signed   By: Tiburcio Pea M.D.   On: 07/25/2022 05:10   CT ANGIO HEAD NECK W WO CM  Result Date: 07/25/2022 CLINICAL DATA:  Hemorrhagic stroke. EXAM: CT ANGIOGRAPHY HEAD AND NECK WITH AND WITHOUT CONTRAST TECHNIQUE: Multidetector CT imaging of the head and neck was performed using the standard protocol during bolus administration of intravenous contrast. Multiplanar CT image reconstructions and MIPs were obtained to evaluate the vascular anatomy. Carotid stenosis measurements (when applicable) are obtained utilizing NASCET criteria, using the distal internal carotid diameter as the denominator. RADIATION DOSE REDUCTION: This exam was performed according to the departmental dose-optimization program which includes automated exposure control, adjustment of the mA and/or kV according to patient size  and/or use of iterative reconstruction technique. CONTRAST:  75mL OMNIPAQUE IOHEXOL 350 MG/ML SOLN COMPARISON:  Head CT from earlier today FINDINGS: CTA NECK FINDINGS Aortic arch: 2 vessel branching. Right carotid system: Vessels are smoothly contoured and diffusely patent Left carotid system: Vessels are smoothly contoured and diffusely patent Vertebral arteries: No vertebral or subclavian stenosis or ulceration. Skeleton: Generalized cervical spine degeneration. Other neck: Negative Upper chest: Negative Review of the MIP images confirms the above findings CTA HEAD FINDINGS Anterior circulation: No spot sign, aneurysm, or signs of vascular malformation underlying the acute hemorrhage. Major vessels are widely patent and smoothly contoured. Posterior circulation: No significant stenosis, proximal occlusion, aneurysm, or vascular malformation. Venous sinuses: Separate CT venogram. No dural venous sinus thrombosis or early enhancement. Anatomic variants: None significant Review of the MIP images confirms the above findings IMPRESSION: No vascular lesion or spot sign seen at the left frontal hemorrhage. Electronically Signed   By: Tiburcio Pea M.D.   On: 07/25/2022 05:08   CT Head Wo Contrast  Result Date: 07/25/2022 CLINICAL DATA:  Intracranial hemorrhage EXAM: CT HEAD WITHOUT CONTRAST TECHNIQUE: Contiguous axial images were obtained from the base of the skull through the vertex without intravenous contrast. RADIATION DOSE REDUCTION: This exam was performed according to the departmental dose-optimization program which includes automated  exposure control, adjustment of the mA and/or kV according to patient size and/or use of iterative reconstruction technique. COMPARISON:  None Available. FINDINGS: Brain: Large acute intraparenchymal hematoma of the anterior superior left frontal lobe with overlying subarachnoid blood. Volume is approximately 18 mL. The hematoma is located within the left ACA territory. No  midline shift or other mass effect. Vascular: No abnormal hyperdensity of the major intracranial arteries or dural venous sinuses. No intracranial atherosclerosis. Skull: The visualized skull base, calvarium and extracranial soft tissues are normal. Sinuses/Orbits: No fluid levels or advanced mucosal thickening of the visualized paranasal sinuses. No mastoid or middle ear effusion. The orbits are normal. IMPRESSION: 1. Large acute intraparenchymal hematoma of the anterior superior left frontal lobe with overlying subarachnoid blood. Volume is approximately 18 mL. 2. No midline shift or other mass effect. Electronically Signed   By: Deatra Robinson M.D.   On: 07/25/2022 03:29   MR BRAIN WO CONTRAST  Result Date: 07/25/2022 CLINICAL DATA:  Altered mental status EXAM: MRI HEAD WITHOUT CONTRAST TECHNIQUE: Multiplanar, multiecho pulse sequences of the brain and surrounding structures were obtained without intravenous contrast. COMPARISON:  None Available. FINDINGS: Brain: There is a large area of hemorrhage (approximately 7.0 x 2.0 x 2.5 cm) in the left frontal lobe, within the anterior cerebral artery territory. Mild surrounding edema. No visible acute ischemia, though susceptibility effects from the left frontal blood cause artifacts on the diffusion-weighted imaging. Normal white matter signal, parenchymal volume and CSF spaces. The midline structures are normal. Vascular: Major flow voids are preserved. Skull and upper cervical spine: Normal calvarium and skull base. Visualized upper cervical spine and soft tissues are normal. Sinuses/Orbits:No paranasal sinus fluid levels or advanced mucosal thickening. No mastoid or middle ear effusion. Normal orbits. IMPRESSION: Large area of hemorrhage (approximately 18 mL) in the left frontal lobe, within the anterior cerebral artery territory. The distribution raises suspicion for hemorrhagic conversion and ACA territory infarct. Head CT is recommended. Critical  Value/emergent results were called by telephone at the time of interpretation on 07/25/2022 at 2:47 am to Dr. Preston Fleeting, who verbally acknowledged these results. Electronically Signed   By: Deatra Robinson M.D.   On: 07/25/2022 02:47     PHYSICAL EXAM  Temp:  [97.9 F (36.6 C)-98.9 F (37.2 C)] 97.9 F (36.6 C) (05/11 0712) Pulse Rate:  [60-99] 64 (05/11 1015) Resp:  [14-24] 16 (05/11 1015) BP: (97-155)/(62-125) 128/73 (05/11 1015) SpO2:  [93 %-100 %] 97 % (05/11 1015) Weight:  [90.4 kg] 90.4 kg (05/11 0611)  General - Well nourished, well developed, eyes closed, in no apparent distress.  Ophthalmologic - fundi not visualized due to noncooperation.  Cardiovascular - Regular rhythm and rate.  Neuro - eyes closed, barely open on voice and tactile stimulation, nonverbal, not following commands.  With forced eyes opening, left eye midline, right eye outward deviation, not blinking to visual threat on the right, slightly and inconsistently blinking to be described on the left, not tracking bilaterally, PERRL.  Right nasolabial fold flattening but no significant facial droop. Tongue protrusion not corporative. LUE strong withdraw to pain and localize to pain, LLE 3-/5 withdrawal to pain. RUE flaccid, right lower extremity withdraw to pain but weaker than the left.  Sensation, coordination and gait not tested.     ASSESSMENT/PLAN Anna Berry is a 62 y.o. female with history of sickle cell trait, diverticulosis, asthma admitted for speech difficulty. No tPA given due to ICH.    ICH: Initial left frontal ICH with  left ACA distribution, concerning for hemorrhagic transformation.  Then new left frontal ICH lateral to previous hematoma, etiology unclear MRI brain left frontal ICH, within the ACA territory. CT head left frontal ICH along left ACA territory with overlying SAH.  Volume is approximately 18 ml CT head and neck unremarkable CTV no venous thrombosis CT repeat 5/11 8 AM progression  of left frontal ICH with new hematoma lateral to the presenting hematoma in the frontal lobe, midline shift 5 mm 2D Echo pending LDL 105 HgbA1c 6.0 UDS pending SCDs for VTE prophylaxis No antithrombotic prior to admission, now on No antithrombotic due to ICH. Therapy recommendations: Pending Disposition: Pending  Cerebral edema CT repeat 5/11 8 AM progression of left frontal ICH with new hematoma lateral to the presenting hematoma in the frontal lobe, midline shift 5 mm CT repeat pending Will consider hypertonic saline if worsening edema.  BP management  Stable however given recurrent ICH, recommend BP goal <130 On Cleviprex as needed Long term BP goal normotensive  Hyperlipidemia Home meds: None LDL 105, goal < 70 Will consider statin at discharge  Dysphagia Did not pass swallow N.p.o. for now On IV fluid  Other Stroke Risk Factors Obesity, Body mass index is 32.17 kg/m.   Other Active Problems Mild leukocytosis, WBC 11.3 Sickle cell trait Diverticulosis  Hospital day # 0  This patient is critically ill due to ICH, recurrent ICH, cerebral edema and at significant risk of neurological worsening, death form brain herniation, respiratory failure, brain death. This patient's care requires constant monitoring of vital signs, hemodynamics, respiratory and cardiac monitoring, review of multiple databases, neurological assessment, discussion with family, other specialists and medical decision making of high complexity. I spent 45 minutes of neurocritical care time in the care of this patient.   Marvel Plan, MD PhD Stroke Neurology 07/25/2022 10:59 AM    To contact Stroke Continuity provider, please refer to [email or web address]. After hours, contact General Neurology

## 2022-07-26 ENCOUNTER — Other Ambulatory Visit (HOSPITAL_COMMUNITY): Payer: 59

## 2022-07-26 DIAGNOSIS — I611 Nontraumatic intracerebral hemorrhage in hemisphere, cortical: Secondary | ICD-10-CM | POA: Diagnosis not present

## 2022-07-26 LAB — BASIC METABOLIC PANEL
Anion gap: 10 (ref 5–15)
BUN: 9 mg/dL (ref 8–23)
CO2: 26 mmol/L (ref 22–32)
Calcium: 9.3 mg/dL (ref 8.9–10.3)
Chloride: 107 mmol/L (ref 98–111)
Creatinine, Ser: 0.94 mg/dL (ref 0.44–1.00)
GFR, Estimated: >60 mL/min (ref >60)
Glucose, Bld: 133 mg/dL — ABNORMAL HIGH (ref 70–99)
Potassium: 3.3 mmol/L — ABNORMAL LOW (ref 3.5–5.1)
Sodium: 143 mmol/L (ref 135–145)

## 2022-07-26 LAB — RAPID URINE DRUG SCREEN, HOSP PERFORMED
Amphetamines: NOT DETECTED
Barbiturates: NOT DETECTED
Benzodiazepines: NOT DETECTED
Cocaine: NOT DETECTED
Opiates: NOT DETECTED
Tetrahydrocannabinol: NOT DETECTED

## 2022-07-26 LAB — GLUCOSE, CAPILLARY
Glucose-Capillary: 118 mg/dL — ABNORMAL HIGH (ref 70–99)
Glucose-Capillary: 113 mg/dL — ABNORMAL HIGH (ref 70–99)

## 2022-07-26 LAB — CBC
HCT: 37.4 % (ref 36.0–46.0)
Hemoglobin: 12.5 g/dL (ref 12.0–15.0)
MCH: 28.2 pg (ref 26.0–34.0)
MCHC: 33.4 g/dL (ref 30.0–36.0)
MCV: 84.2 fL (ref 80.0–100.0)
Platelets: 256 10*3/uL (ref 150–400)
RBC: 4.44 MIL/uL (ref 3.87–5.11)
RDW: 15.1 % (ref 11.5–15.5)
WBC: 10.6 10*3/uL — ABNORMAL HIGH (ref 4.0–10.5)
nRBC: 0 % (ref 0.0–0.2)

## 2022-07-26 MED ORDER — POTASSIUM CHLORIDE 10 MEQ/100ML IV SOLN
10.0000 meq | INTRAVENOUS | Status: AC
  Administered 2022-07-26 (×3): 10 meq via INTRAVENOUS
  Filled 2022-07-26 (×3): qty 100

## 2022-07-26 NOTE — Evaluation (Signed)
Clinical/Bedside Swallow Evaluation Patient Details  Name: Anna Berry MRN: [id number] Date of Birth: 1960-06-13  Today's Date: 07/26/2022 Time: SLP Start Time (ACUTE ONLY): 1327 SLP Stop Time (ACUTE ONLY): 1341 SLP Time Calculation (min) (ACUTE ONLY): 14 min  Past Medical History:  Past Medical History:  Diagnosis Date   Allergy    Anemia    Arthritis    Asthma    allergies induced   Bilateral lower extremity edema    Depression    Fibroids 08/2002   Gross hematuria 05/2010   History of appendicitis    History of colon polyps 2017   Multiple renal cysts 05/24/2008   Two left renal parapelvic cysts.   OA (osteoarthritis) of knee    Right   Obesity    Plantar fasciitis    Postmenopausal    Pre-diabetes    Renal papillary necrosis (HCC)    Seasonal allergies    Sickle cell anemia (HCC)    sickle cell trait   Sickle cell trait (HCC)    Sigmoid diverticulosis 09/26/2015   Noted on colonoscopy   Past Surgical History:  Past Surgical History:  Procedure Laterality Date   ABDOMINAL HYSTERECTOMY  2004   APPENDECTOMY  2006   CESAREAN SECTION  1981, 1987   x2   COLONOSCOPY     COLONOSCOPY W/ POLYPECTOMY  09/26/2015   POLYPECTOMY     TOTAL KNEE ARTHROPLASTY Right 07/29/2018   Procedure: RIGHT TOTAL KNEE ARTHROPLASTY;  Surgeon: Tarry Kos, MD;  Location: [facility];  Service: Orthopedics;  Laterality: Right;   HPI:  Pt is a 62 y.o. female who presented with difficulty speaking. MRI brain 5/11: Large area of hemorrhage (approximately 18 mL) in the left frontal lobe, within the anterior cerebral artery territory. Pt passed [facility] 5/11, but then had neuro changes and could not complete a repeat [facility] due to lethargy. PMH: sickle cell trait, sigmoid diverticulosis, asthma, OA.    Assessment / Plan / Recommendation  Clinical Impression  Pt was seen for bedside swallow evaluation with her family present who denied the pt having a history of dysphagia. The evaluation was limited  due to pt's lethargy.  A complete oral mechanism could not be conducted due to pt's difficulty following commands, but right-sided facial weakness was noted. [name] performance was likely negatively impacted by her suboptimal alertness, but she presented with symptoms of oral phase dysphagia characterized by anterior spillage, reduced bolus awareness, oral holding, and lingual residue. An oral and/or pharyngeal delay is suspected considering time of bolus presentation vs that of hyolaryngeal movement. No s/s of aspiration were noted, but trials were limited due to her lethargy. It is recommended that the pt's NPO status be maintained, but should her alertness improve significantly, she may have individual ice chips after oral care. If pt's alertness remains poor, enteral nutrition will likely be warranted, but SLP will follow to assess improvement in swallow function. SLP Visit Diagnosis: Dysphagia, unspecified (R13.10)    Aspiration Risk  Severe aspiration risk;Risk for inadequate nutrition/hydration    Diet Recommendation NPO   Medication Administration: Via alternative means    Other  Recommendations Oral Care Recommendations: Oral care QID;Oral care prior to ice chip/H20    Recommendations for follow up therapy are one component of a multi-disciplinary discharge planning process, led by the attending physician.  Recommendations may be updated based on patient status, additional functional criteria and insurance authorization.  Follow up Recommendations  (TBD)      Assistance Recommended at Discharge  Functional Status Assessment Patient has had a recent decline in their functional status and demonstrates the ability to make significant improvements in function in a reasonable and predictable amount of time.  Frequency and Duration min 2x/week  2 weeks       Prognosis Prognosis for improved oropharyngeal function: Fair Barriers to Reach Goals: Severity of deficits;Language deficits       Swallow Study   General Date of Onset: 07/25/22 HPI: Pt is a 62 y.o. female who presented with difficulty speaking. MRI brain 5/11: Large area of hemorrhage (approximately 18 mL) in the left frontal lobe, within the anterior cerebral artery territory. Pt passed [facility] 5/11, but then had neuro changes and could not complete a repeat Yale due to lethargy. PMH: sickle cell trait, sigmoid diverticulosis, asthma, OA. Type of Study: Bedside Swallow Evaluation Previous Swallow Assessment: none Diet Prior to this Study: NPO Temperature Spikes Noted: No Respiratory Status: Room air History of Recent Intubation: No Behavior/Cognition: Cooperative;Pleasant mood;Lethargic/Drowsy Oral Cavity Assessment: Within Functional Limits Oral Care Completed by SLP: Yes Oral Cavity - Dentition: Adequate natural dentition Vision: Functional for self-feeding Self-Feeding Abilities: Total assist Patient Positioning: Upright in bed;Postural control adequate for testing Baseline Vocal Quality: Normal Volitional Cough: Cognitively unable to elicit    Oral/Motor/Sensory Function Overall Oral Motor/Sensory Function:  (difficult to assess)   Ice Chips Ice chips: Impaired Presentation: Spoon Oral Phase Impairments: Poor awareness of bolus;Reduced lingual movement/coordination;Reduced labial seal Oral Phase Functional Implications: Prolonged oral transit;Left anterior spillage   Thin Liquid Thin Liquid: Not tested    Nectar Thick Nectar Thick Liquid: Not tested   Honey Thick Honey Thick Liquid: Not tested   Puree Puree: Impaired Presentation: Spoon Oral Phase Impairments: Poor awareness of bolus;Reduced labial seal;Reduced lingual movement/coordination Oral Phase Functional Implications: Oral residue   Solid     Solid: Not tested     Anna Berry I. Vear Clock, Anna Berry, Anna Berry Neuro Diagnostic Specialist  Acute Rehabilitation Services Office number: [phone]  Anna Berry 07/26/2022,1:59 PM

## 2022-07-26 NOTE — Progress Notes (Addendum)
STROKE TEAM PROGRESS NOTE   SUBJECTIVE (INTERVAL HISTORY) Patient is seen in her room with 1 family member at the bedside.  She remains hemodynamically stable, and she is slightly more alert today than yesterday and her neurological exam is otherwise stable.  Her swallow evaluation is pending, and cortrack will be inserted if she does not pass.  OBJECTIVE Temp:  [98.2 F (36.8 C)-98.9 F (37.2 C)] 98.9 F (37.2 C) (05/11 2345) Pulse Rate:  [86-137] 108 (05/12 1200) Cardiac Rhythm: Sinus tachycardia (05/12 0800) Resp:  [13-26] 19 (05/12 1200) BP: (104-159)/(56-100) 125/87 (05/12 1200) SpO2:  [93 %-100 %] 95 % (05/12 1200) Weight:  [87.7 kg] 87.7 kg (05/12 0300)  Recent Labs  Lab 07/25/22 1123 07/25/22 1532 07/25/22 1912 07/25/22 2253 07/26/22 0255  GLUCAP 90 131* 147* 154* 118*    Recent Labs  Lab 07/24/22 2035 07/24/22 2040 07/26/22 0255  NA 140 140 143  K 4.3 4.2 3.3*  CL 105 108 107  CO2 24  --  26  GLUCOSE 107* 109* 133*  BUN 10 14 9   CREATININE 1.12* 1.10* 0.94  CALCIUM 9.5  --  9.3    Recent Labs  Lab 07/24/22 2035  AST 27  ALT 19  ALKPHOS 79  BILITOT 0.5  PROT 7.2  ALBUMIN 4.2    Recent Labs  Lab 07/24/22 2035 07/24/22 2040 07/26/22 0255  WBC 11.3*  --  10.6*  NEUTROABS 8.6*  --   --   HGB 13.3 13.6 12.5  HCT 42.5 40.0 37.4  MCV 90.0  --  84.2  PLT 268  --  256    No results for input(s): "CKTOTAL", "CKMB", "CKMBINDEX", "TROPONINI" in the last 168 hours. Recent Labs    07/24/22 2035  LABPROT 14.2  INR 1.1    No results for input(s): "COLORURINE", "LABSPEC", "PHURINE", "GLUCOSEU", "HGBUR", "BILIRUBINUR", "KETONESUR", "PROTEINUR", "UROBILINOGEN", "NITRITE", "LEUKOCYTESUR" in the last 72 hours.  Invalid input(s): "APPERANCEUR"     Component Value Date/Time   CHOL 164 07/25/2022 0623   CHOL 155 09/12/2021 1509   TRIG 52 07/25/2022 0623   HDL 49 07/25/2022 0623   HDL 46 09/12/2021 1509   CHOLHDL 3.3 07/25/2022 0623   VLDL 10  07/25/2022 0623   LDLCALC 105 (H) 07/25/2022 0623   LDLCALC 85 09/12/2021 1509   Lab Results  Component Value Date   HGBA1C 6.0 (H) 07/25/2022   No results found for: "LABOPIA", "COCAINSCRNUR", "LABBENZ", "AMPHETMU", "THCU", "LABBARB"  Recent Labs  Lab 07/24/22 2035  ETH <10     I have personally reviewed the radiological images below and agree with the radiology interpretations.  EEG adult  Result Date: 07/25/2022 Jefferson Fuel, MD     07/25/2022  8:18 PM Routine EEG Report Anna Berry is a 62 y.o. female with a history of intracranial hemorrhage and altered mental status who is undergoing an EEG to evaluate for seizures. Report: This EEG was acquired with electrodes placed according to the International 10-20 electrode system (including Fp1, Fp2, F3, F4, C3, C4, P3, P4, O1, O2, T3, T4, T5, T6, A1, A2, Fz, Cz, Pz). The following electrodes were missing or displaced: none. The occipital dominant rhythm was 7 Hz. This activity is reactive to stimulation. Drowsiness was manifested by background fragmentation; deeper stages of sleep were not identified. There was focal slowing over the left frontal region. There were no interictal epileptiform discharges. There were no electrographic seizures identified. Photic stimulation and hyperventilation were not performed. Impression and clinical correlation: This  EEG was obtained while awake and drowsy and is abnormal due to mild diffuse slowing indicative of global cerebral dysfunction and left frontal slowing over the area of intracranial hemorrhage. Epileptiform abnormalities were not seen during this recording. Bing Neighbors, MD [facility] [phone] If 7pm- 7am, please page neurology on call as listed in AMION.   CT HEAD WO CONTRAST ( )  Result Date: 07/25/2022 CLINICAL DATA:  Intracranial hemorrhage follow up EXAM: CT HEAD WITHOUT CONTRAST TECHNIQUE: Contiguous axial images were obtained from the base of the skull through  the vertex without intravenous contrast. RADIATION DOSE REDUCTION: This exam was performed according to the departmental dose-optimization program which includes automated exposure control, adjustment of the mA and/or kV according to patient size and/or use of iterative reconstruction technique. COMPARISON:  07/25/2022 at 9:04 a.m. FINDINGS: Brain: Compared to the most recent CT, the large mixed intra-axial and extra-axial hematoma at the left frontal lobe is unchanged. There is persistent rightward midline shift that measures 5 mm. Vascular: No hyperdense vessel or unexpected calcification. Skull: Normal. Negative for fracture or focal lesion. Sinuses/Orbits: No acute finding. Other: None. IMPRESSION: Unchanged large mixed intra- and extra-axial hematoma at the left frontal lobe with persistent rightward midline shift that measures 5 mm. Electronically Signed   By: Deatra Robinson M.D.   On: 07/25/2022 20:10   [place] EKG SITE RITE  Result Date: 07/25/2022 If Site Rite image not attached, placement could not be confirmed due to current cardiac rhythm.  CT HEAD WO CONTRAST  Result Date: 07/25/2022 CLINICAL DATA:  Stroke follow-up EXAM: CT HEAD WITHOUT CONTRAST TECHNIQUE: Contiguous axial images were obtained from the base of the skull through the vertex without intravenous contrast. RADIATION DOSE REDUCTION: This exam was performed according to the departmental dose-optimization program which includes automated exposure control, adjustment of the mA and/or kV according to patient size and/or use of iterative reconstruction technique. COMPARISON:  CT and CTA from earlier today FINDINGS: Brain: Superior left frontal/parasagittal hematoma with progression in the adjacent frontal white matter. Hemorrhage is seen in both the parenchymal and subarachnoid compartments with mild brain edema. The area of newly affected brain had a normal appearance on the prior brain MRI. The CTV adn MRI are again reviewed and the superior  sagittal sinus is patent and there is no detectable cortical vein thrombus. The new area of hemorrhage measures 3.4 x 1.8 x 1.7 cm. Rightward midline shift measures 5 mm. No hydrocephalus. Vascular: No hyperdense vessel or unexpected calcification. Skull: Normal. Negative for fracture or focal lesion. Sinuses/Orbits: No acute finding. Dr. Roda Shutters is aware of the findings at time of signing. IMPRESSION: Progression of left frontal ICH with new hematoma lateral to the presenting hematoma in the frontal lobe. Midline shift now measures 5 mm. Electronically Signed   By: Tiburcio Pea M.D.   On: 07/25/2022 09:37   CT VENOGRAM HEAD  Result Date: 07/25/2022 CLINICAL DATA:  ICH EXAM: CT VENOGRAM HEAD TECHNIQUE: Venographic phase images of the brain were obtained following the administration of intravenous contrast. Multiplanar reformats and maximum intensity projections were generated. RADIATION DOSE REDUCTION: This exam was performed according to the departmental dose-optimization program which includes automated exposure control, adjustment of the mA and/or kV according to patient size and/or use of iterative reconstruction technique. CONTRAST:  75mL OMNIPAQUE IOHEXOL 350 MG/ML SOLN COMPARISON:  Head CT and brain MRI from earlier today FINDINGS: No dural venous sinus thrombosis. The superior sagittal sinus is diffusely patent, including adjacent to the known left frontal hemorrhage. Deep  and cortical veins also show no signs of thrombosis. The left transverse sigmoid outflow is dominant without stricture or other filling defect seen. No masslike enhancement at the hematoma. Follow-up after hemorrhage resolution may be useful in detecting compressed vascular lesion. IMPRESSION: Negative for dural venous sinus thrombosis. No vascular lesion seen underlying the hemorrhage. Electronically Signed   By: Tiburcio Pea M.D.   On: 07/25/2022 05:10   CT ANGIO HEAD NECK W WO CM  Result Date: 07/25/2022 CLINICAL DATA:   Hemorrhagic stroke. EXAM: CT ANGIOGRAPHY HEAD AND NECK WITH AND WITHOUT CONTRAST TECHNIQUE: Multidetector CT imaging of the head and neck was performed using the standard protocol during bolus administration of intravenous contrast. Multiplanar CT image reconstructions and MIPs were obtained to evaluate the vascular anatomy. Carotid stenosis measurements (when applicable) are obtained utilizing NASCET criteria, using the distal internal carotid diameter as the denominator. RADIATION DOSE REDUCTION: This exam was performed according to the departmental dose-optimization program which includes automated exposure control, adjustment of the mA and/or kV according to patient size and/or use of iterative reconstruction technique. CONTRAST:  75mL OMNIPAQUE IOHEXOL 350 MG/ML SOLN COMPARISON:  Head CT from earlier today FINDINGS: CTA NECK FINDINGS Aortic arch: 2 vessel branching. Right carotid system: Vessels are smoothly contoured and diffusely patent Left carotid system: Vessels are smoothly contoured and diffusely patent Vertebral arteries: No vertebral or subclavian stenosis or ulceration. Skeleton: Generalized cervical spine degeneration. Other neck: Negative Upper chest: Negative Review of the MIP images confirms the above findings CTA HEAD FINDINGS Anterior circulation: No spot sign, aneurysm, or signs of vascular malformation underlying the acute hemorrhage. Major vessels are widely patent and smoothly contoured. Posterior circulation: No significant stenosis, proximal occlusion, aneurysm, or vascular malformation. Venous sinuses: Separate CT venogram. No dural venous sinus thrombosis or early enhancement. Anatomic variants: None significant Review of the MIP images confirms the above findings IMPRESSION: No vascular lesion or spot sign seen at the left frontal hemorrhage. Electronically Signed   By: Tiburcio Pea M.D.   On: 07/25/2022 05:08   CT Head Wo Contrast  Result Date: 07/25/2022 CLINICAL DATA:   Intracranial hemorrhage EXAM: CT HEAD WITHOUT CONTRAST TECHNIQUE: Contiguous axial images were obtained from the base of the skull through the vertex without intravenous contrast. RADIATION DOSE REDUCTION: This exam was performed according to the departmental dose-optimization program which includes automated exposure control, adjustment of the mA and/or kV according to patient size and/or use of iterative reconstruction technique. COMPARISON:  None Available. FINDINGS: Brain: Large acute intraparenchymal hematoma of the anterior superior left frontal lobe with overlying subarachnoid blood. Volume is approximately 18 mL. The hematoma is located within the left ACA territory. No midline shift or other mass effect. Vascular: No abnormal hyperdensity of the major intracranial arteries or dural venous sinuses. No intracranial atherosclerosis. Skull: The visualized skull base, calvarium and extracranial soft tissues are normal. Sinuses/Orbits: No fluid levels or advanced mucosal thickening of the visualized paranasal sinuses. No mastoid or middle ear effusion. The orbits are normal. IMPRESSION: 1. Large acute intraparenchymal hematoma of the anterior superior left frontal lobe with overlying subarachnoid blood. Volume is approximately 18 mL. 2. No midline shift or other mass effect. Electronically Signed   By: Deatra Robinson M.D.   On: 07/25/2022 03:29   MR BRAIN WO CONTRAST  Result Date: 07/25/2022 CLINICAL DATA:  Altered mental status EXAM: MRI HEAD WITHOUT CONTRAST TECHNIQUE: Multiplanar, multiecho pulse sequences of the brain and surrounding structures were obtained without intravenous contrast. COMPARISON:  None Available. FINDINGS:  Brain: There is a large area of hemorrhage (approximately 7.0 x 2.0 x 2.5 cm) in the left frontal lobe, within the anterior cerebral artery territory. Mild surrounding edema. No visible acute ischemia, though susceptibility effects from the left frontal blood cause artifacts on the  diffusion-weighted imaging. Normal white matter signal, parenchymal volume and CSF spaces. The midline structures are normal. Vascular: Major flow voids are preserved. Skull and upper cervical spine: Normal calvarium and skull base. Visualized upper cervical spine and soft tissues are normal. Sinuses/Orbits:No paranasal sinus fluid levels or advanced mucosal thickening. No mastoid or middle ear effusion. Normal orbits. IMPRESSION: Large area of hemorrhage (approximately 18 mL) in the left frontal lobe, within the anterior cerebral artery territory. The distribution raises suspicion for hemorrhagic conversion and ACA territory infarct. Head CT is recommended. Critical Value/emergent results were called by telephone at the time of interpretation on 07/25/2022 at 2:47 am to Dr. Preston Fleeting, who verbally acknowledged these results. Electronically Signed   By: Deatra Robinson M.D.   On: 07/25/2022 02:47     PHYSICAL EXAM  Temp:  [98.2 F (36.8 C)-98.9 F (37.2 C)] 98.9 F (37.2 C) (05/11 2345) Pulse Rate:  [86-137] 108 (05/12 1200) Resp:  [13-26] 19 (05/12 1200) BP: (104-159)/(56-100) 125/87 (05/12 1200) SpO2:  [93 %-100 %] 95 % (05/12 1200) Weight:  [87.7 kg] 87.7 kg (05/12 0300)  General - Well nourished, well developed, eyes closed, in no apparent distress.  Cardiovascular - Regular rhythm and rate.  Neuro -patient rests with eyes closed but will open eyes to loud voice and touch.  Pupils equal round and reactive to light, right facial droop present, patient does not follow commands but will localize noxious stimuli with left upper extremity will withdraw bilateral lower extremities to noxious but will not move right upper extremity.  ASSESSMENT/PLAN Ms. Anna Berry is a 62 y.o. female with history of sickle cell trait, diverticulosis, asthma admitted for speech difficulty. No tPA given due to ICH.    ICH: Initial left frontal ICH with left ACA distribution, concerning for hemorrhagic  transformation.  Then new left frontal ICH lateral to previous hematoma, etiology unclear MRI brain left frontal ICH, within the ACA territory. CT head left frontal ICH along left ACA territory with overlying SAH.  Volume is approximately 18 ml CT head and neck unremarkable CTV no venous thrombosis 2D Echo pending EEG no seizure LDL 105 HgbA1c 6.0 UDS pending SCDs for VTE prophylaxis No antithrombotic prior to admission, now on No antithrombotic due to ICH. Therapy recommendations: CIR Disposition: Pending  Cerebral edema CT repeat 5/11 8 AM progression of left frontal ICH with new hematoma lateral to the presenting hematoma in the frontal lobe, midline shift 5 mm CT repeat 5/11 unchanged mixed intra and extra-axial hematoma at left frontal lobe with persistent rightward midline shift and 5 mm Repeat CT 5/13 pending Will consider hypertonic saline if worsening edema.  BP management  Stable however given recurrent ICH, recommend BP goal <130 On Cleviprex Will relax BP goal if CT stable in am Long term BP goal normotensive  Hyperlipidemia Home meds: None LDL 105, goal < 70 Will consider statin at discharge  Dysphagia Did not pass swallow N.p.o. for now On IV fluid Consider core track placement and tube feeding in a.m.  Other Stroke Risk Factors Obesity, Body mass index is 31.21 kg/m.   Other Active Problems Mild leukocytosis, WBC 11.3-> 10.6 Sickle cell trait Diverticulosis  Hospital day # 1  Patient seen by NP and  then by MD, MD to edit note is needed Cortney E Ernestina Columbia , MSN, AGACNP-BC [facility] See Amion for schedule and pager information 07/26/2022 12:42 PM  ATTENDING NOTE: I reviewed above note and agree with the assessment and plan. Pt was seen and examined.   Son and RN are at bedside.  Patient still drowsy sleepy, however more awake than yesterday, able to open eyes on voice and tactile stimulation but cannot remain awake without  repetitive stimulation.  Still has left gaze preference and right hemiplegia.  CT repeat last night showed stable left frontal ICH and SAH.  Still on Cleviprex for BP goal less than 130.  Will repeat CT in a.m., if continues to be stable, will relax BP goal.  Continue IV fluid.  Will consider core track for tube feeding tomorrow.  For detailed assessment and plan, please refer to above/below as I have made changes wherever appropriate.   This patient is critically ill due to ICH, recurrent ICH, cerebral edema and at significant risk of neurological worsening, death form brain herniation, respiratory failure, brain death. This patient's care requires constant monitoring of vital signs, hemodynamics, respiratory and cardiac monitoring, review of multiple databases, neurological assessment, discussion with family, other specialists and medical decision making of high complexity. I spent 45 minutes of neurocritical care time in the care of this patient. I had long discussion with son at bedside, updated pt current condition, treatment plan and potential prognosis, and answered all the questions.  He expressed understanding and appreciation.    Marvel Plan, MD PhD Stroke Neurology 07/26/2022 6:39 PM     To contact Stroke Continuity provider, please refer to [email or web address]. After hours, contact General Neurology

## 2022-07-26 NOTE — Progress Notes (Signed)
PT Cancellation Note  Patient Details Name: Anna Berry MRN: [id number] DOB: 12-31-1960   Cancelled Treatment:    Reason Eval/Treat Not Completed: Active bedrest order. Coordinated with RN to notify PT if pt's bedrest order is discharged and if pt is appropriate for PT Eval this date. Will plan to follow-up later as time permits.   Raymond Gurney, PT, DPT Acute Rehabilitation Services  Office: [phone]   Jewel Baize 07/26/2022, 9:58 AM

## 2022-07-26 NOTE — Evaluation (Signed)
Physical Therapy Evaluation Patient Details Name: Anna Berry MRN: [id number] DOB: October 04, 1960 Today's Date: 07/26/2022  History of Present Illness  Pt is a 62 y.o. female who presented 07/25/22 with speech difficulties. MRI showed a L frontal ICH. Worsening neuro decline noted 5/11 after admitting pt. Repeat imaging showed new area of ICH lateral to current ICH with MLS 5 mm. PMH: Sickle cell trait, sigmoid diverticulosis, asthma, OA, obesity, pre-diabetes   Clinical Impression  Pt presents with condition above and deficits mentioned below, see PT Problem List. 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 [name] full assessment was limited by her lethargy this date, with pt only opening her eyes briefly intermittently, not following cues, and not attempting to communicate with therapist. Pt does withdraw to noxious stimuli distally in her R upper and lower extremity with a delay, indicating some sensation being present in her extremities. She displays overall weakness in her R extremities though, more so in her R UE than her R lower extremity. Pt also displays increased tone in her ankle plantarflexors, thus coordinated with RN to get pt a PRAFO and educated family on stretching the ankle safely to prevent contractures. She tends to push herself over with her L UE to her R and lean posteriorly, needing primarily modA for her sitting balance. She required maxA to transition supine to sit EOB and to transfer to stand then total assist to return to supine. As pt has had a drastic functional decline and has good family support, she could greatly benefit from intensive inpatient rehab, >3 hours/day. Will continue to follow acutely.        Recommendations for follow up therapy are one component of a multi-disciplinary discharge planning process, led by the attending physician.  Recommendations may be updated based on patient status,  additional functional criteria and insurance authorization.  Follow Up Recommendations       Assistance Recommended at Discharge Frequent or constant Supervision/Assistance  Patient can return home with the following  Two people to help with walking and/or transfers;A lot of help with bathing/dressing/bathroom;Assistance with cooking/housework;Assistance with feeding;Direct supervision/assist for medications management;Direct supervision/assist for financial management;Assist for transportation;Help with stairs or ramp for entrance    Equipment Recommendations Other (comment) (TBA)  Recommendations for Other Services  Rehab consult    Functional Status Assessment Patient has had a recent decline in their functional status and demonstrates the ability to make significant improvements in function in a reasonable and predictable amount of time.     Precautions / Restrictions Precautions Precautions: Fall;Other (comment) Precaution Comments: L mitten; SBP < 130 goal Restrictions Weight Bearing Restrictions: No      Mobility  Bed Mobility Overal bed mobility: Needs Assistance Bed Mobility: Supine to Sit, Sit to Supine     Supine to sit: Max assist, HOB elevated Sit to supine: Total assist, HOB elevated   General bed mobility comments: Pt dependent on therapist to direct legs off L EOB but pt did grab therapist's arm with her L UE and did pull on it, assisting with ascending her trunk to sit L EOB, maxA. Total assist to prop pt on her L elbow, control her trunk, and lift her legs onto the bed to return to supine.    Transfers Overall transfer level: Needs assistance Equipment used: 1 [place] hand held assist Transfers: Sit to/from Stand Sit to Stand: Max assist, +2 safety/equipment  General transfer comment: Son assisting therapist with transfer to try to encourage pt participation/arousal. MaxA with knees blocked to stand pt 1x from EOB with pt demonstrating a  posterior lean but reportedly pulling on son's hand with her L UE.    Ambulation/Gait               General Gait Details: unable  Stairs            Wheelchair Mobility    Modified Rankin (Stroke Patients Only) Modified Rankin (Stroke Patients Only) Pre-Morbid Rankin Score: No symptoms Modified Rankin: Severe disability     Balance Overall balance assessment: Needs assistance Sitting-balance support: Single extremity supported, No upper extremity supported, Feet supported Sitting balance-Leahy Scale: Poor Sitting balance - Comments: Posterior lean and pushing self with L UE to her R, needing modA majority of time for static sitting balance, but did note pt would activate her trunk to prevent full LOB posteriorly intermittently. Propped pt on L and R elbow several times sitting EOB, improved midline alignment after being propped on L elbow. Postural control: Posterior lean, Right lateral lean Standing balance support: Single extremity supported Standing balance-Leahy Scale: Poor Standing balance comment: MaxA to stand statically with L UE support, displaying a strong posterior lean                             Pertinent Vitals/Pain Pain Assessment Pain Assessment: Faces Faces Pain Scale: Hurts a little bit Pain Location: grimacing with noxious stimuli Pain Descriptors / Indicators: Discomfort, Grimacing Pain Intervention(s): Limited activity within patient's tolerance, Monitored during session, Repositioned    Home Living Family/patient expects to be discharged to:: Private residence Living Arrangements: Spouse/significant other Available Help at Discharge: Family;Available 24 hours/day;Other (Comment) (will hire people if needed) Type of Home: House Home Access: Stairs to enter Entrance Stairs-Rails: None Entrance Stairs-Number of Steps: 2   Home Layout: One level Home Equipment: None Additional Comments: Husband uses a cane currently after just  recently discharging hospital due to hypoglycemic event    Prior Function Prior Level of Function : Independent/Modified Independent;Working/employed;Driving             Mobility Comments: No AD ADLs 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 [employer]: Right    Extremity/Trunk Assessment   Upper Extremity Assessment Upper Extremity Assessment: Defer to OT evaluation    Lower Extremity Assessment Lower Extremity Assessment: RLE deficits/detail RLE Deficits / Details: increased tone noted in calf limiting ankle dorsiflexion ROM; spontaneous active movement but not when cued and not lifting much if any against gravity; withdraws to noxious stimuli after delay    Cervical / Trunk Assessment Cervical / Trunk Assessment: Normal  Communication   Communication: Receptive difficulties;Expressive difficulties (difficult to really tell due to lethargy though)  Cognition Arousal/Alertness: Lethargic Behavior During Therapy: Flat affect Overall Cognitive Status: Difficult to assess                                 General Comments: Pt lethargic, keeping eyes closed majority of session, only opening briefly when stood up and when manually opened by PT. Pt not following cues or trying to verbalize. Poor awareness of her pushing self to her R and posteriorly.        General Comments General comments (skin integrity, edema, etc.): encouraged family to sit to  her R to encourage R attention when she is awake; educated family on stretching her R ankle safely into dorsiflexion to prevent contracture; BP: 123/80 (93) supine, 138/88 (104) sitting, 104/59 (72) supine after standing, 113/68 (82) supine end of session    Exercises Other Exercises Other Exercises: PROM to R ankle into dorsiflexion 2-3x ~10 sec each   Assessment/Plan    PT Assessment Patient needs continued PT services  PT Problem List Decreased strength;Decreased activity  tolerance;Decreased balance;Decreased range of motion;Decreased mobility;Decreased coordination;Decreased cognition       PT Treatment Interventions DME instruction;Gait training;Functional mobility training;Therapeutic activities;Therapeutic exercise;Balance training;Neuromuscular re-education;Cognitive remediation;Patient/family education;Stair training    PT Goals (Current goals can be found in the Care Plan section)  Acute Rehab PT Goals Patient Stated Goal: did not state; family hopes for pt to improve PT Goal Formulation: With family Time For Goal Achievement: 08/09/22 Potential to Achieve Goals: Good    Frequency Min 4X/week     Co-evaluation               AM-PAC PT "6 Clicks" Mobility  Outcome Measure Help needed turning from your back to your side while in a flat bed without using bedrails?: A Lot Help needed moving from lying on your back to sitting on the side of a flat bed without using bedrails?: A Lot Help needed moving to and from a bed to a chair (including a wheelchair)?: Total Help needed standing up from a chair using your arms (e.g., wheelchair or bedside chair)?: A Lot Help needed to walk in hospital room?: Total Help needed climbing 3-5 steps with a railing? : Total 6 Click Score: 9    End of Session   Activity Tolerance: Patient limited by lethargy Patient left: in bed;with bed alarm set;with call bell/phone within reach;with SCD's reapplied;with restraints reapplied;with family/visitor present Nurse Communication: Mobility status;Other (comment) (BP) PT Visit Diagnosis: Unsteadiness on feet (R26.81);Muscle weakness (generalized) (M62.81);Difficulty in walking, not elsewhere classified (R26.2);Other symptoms and signs involving the nervous system (R29.898);Hemiplegia and hemiparesis Hemiplegia - Right/Left: Right Hemiplegia - dominant/non-dominant: Dominant Hemiplegia - caused by: Other Nontraumatic intracranial hemorrhage    Time: 1610-9604 PT  Time Calculation (min) (ACUTE ONLY): 30 min   Charges:   PT Evaluation $PT Eval Moderate Complexity: 1 Mod PT Treatments $Therapeutic Activity: 8-22 mins        Raymond Gurney, PT, DPT Acute Rehabilitation Services  Office: [phone]   Jewel Baize 07/26/2022, 1:59 PM

## 2022-07-27 ENCOUNTER — Inpatient Hospital Stay (HOSPITAL_COMMUNITY): Payer: BLUE CROSS/BLUE SHIELD

## 2022-07-27 DIAGNOSIS — I6389 Other cerebral infarction: Secondary | ICD-10-CM

## 2022-07-27 DIAGNOSIS — I611 Nontraumatic intracerebral hemorrhage in hemisphere, cortical: Secondary | ICD-10-CM | POA: Diagnosis not present

## 2022-07-27 LAB — CBC
HCT: 41.7 % (ref 36.0–46.0)
Hemoglobin: 13.7 g/dL (ref 12.0–15.0)
MCH: 27.8 pg (ref 26.0–34.0)
MCHC: 32.9 g/dL (ref 30.0–36.0)
MCV: 84.8 fL (ref 80.0–100.0)
Platelets: 241 10*3/uL (ref 150–400)
RBC: 4.92 MIL/uL (ref 3.87–5.11)
RDW: 15.3 % (ref 11.5–15.5)
WBC: 10.7 10*3/uL — ABNORMAL HIGH (ref 4.0–10.5)
nRBC: 0 % (ref 0.0–0.2)

## 2022-07-27 LAB — BASIC METABOLIC PANEL
Anion gap: 6 (ref 5–15)
BUN: 9 mg/dL (ref 8–23)
CO2: 23 mmol/L (ref 22–32)
Calcium: 9.1 mg/dL (ref 8.9–10.3)
Chloride: 112 mmol/L — ABNORMAL HIGH (ref 98–111)
Creatinine, Ser: 0.91 mg/dL (ref 0.44–1.00)
GFR, Estimated: >60 mL/min (ref >60)
Glucose, Bld: 119 mg/dL — ABNORMAL HIGH (ref 70–99)
Potassium: 3.5 mmol/L (ref 3.5–5.1)
Sodium: 141 mmol/L (ref 135–145)

## 2022-07-27 LAB — ECHOCARDIOGRAM COMPLETE
AR max vel: 2.53 cm2
AV Area VTI: 2.48 cm2
AV Area mean vel: 2.38 cm2
AV Mean grad: 5 mmHg
AV Peak grad: 9.7 mmHg
Ao pk vel: 1.56 m/s
Area-P 1/2: 5.79 cm2
Height: 66 in
S' Lateral: 3 cm
Weight: 3075.86 oz

## 2022-07-27 LAB — MAGNESIUM: Magnesium: 2.4 mg/dL (ref 1.7–2.4)

## 2022-07-27 LAB — PHOSPHORUS: Phosphorus: 3.7 mg/dL (ref 2.5–4.6)

## 2022-07-27 MED ORDER — WHITE PETROLATUM EX OINT
TOPICAL_OINTMENT | CUTANEOUS | Status: DC | PRN
  Filled 2022-07-27: qty 28.35

## 2022-07-27 MED ORDER — PROSOURCE TF20 ENFIT COMPATIBL EN LIQD
60.0000 mL | Freq: Every day | ENTERAL | Status: DC
  Administered 2022-07-27 – 2022-08-03 (×8): 60 mL
  Filled 2022-07-27 (×8): qty 60

## 2022-07-27 MED ORDER — HEPARIN SODIUM (PORCINE) 5000 UNIT/ML IJ SOLN
5000.0000 [IU] | Freq: Three times a day (TID) | INTRAMUSCULAR | Status: DC
  Administered 2022-07-27 – 2022-08-10 (×44): 5000 [IU] via SUBCUTANEOUS
  Filled 2022-07-27 (×43): qty 1

## 2022-07-27 MED ORDER — SENNOSIDES-DOCUSATE SODIUM 8.6-50 MG PO TABS
1.0000 | ORAL_TABLET | Freq: Two times a day (BID) | ORAL | Status: DC
  Administered 2022-07-27 – 2022-08-05 (×8): 1
  Filled 2022-07-27 (×11): qty 1

## 2022-07-27 MED ORDER — JEVITY 1.5 CAL/FIBER PO LIQD
1000.0000 mL | ORAL | Status: DC
  Administered 2022-07-27 – 2022-08-03 (×6): 1000 mL
  Filled 2022-07-27 (×13): qty 1000

## 2022-07-27 NOTE — NC FL2 (Signed)
MEDICAID FL2 LEVEL OF CARE FORM     IDENTIFICATION  Patient Name: Anna Berry Birthdate: 1960/06/01 Sex: female Admission Date (Current Location): 07/24/2022  [facility] and [place] Number:  Producer, television/film/video and Address:  [facility] [place], [address] [place], [place] [address]      Provider Number: [id number]  Attending Physician Name and Address:  Stroke, Md, MD  Relative Name and Phone Number:       Current Level of Care: Hospital Recommended Level of Care: Other (Comment) (Inpatient Rehab Facility) Prior Approval Number:    Date Approved/Denied:   PASRR Number: [id number]  Discharge Plan: Other (Comment) (Inpatient Rehab Facility)    Current Diagnoses: Patient Active Problem List   Diagnosis Date Noted   ICH (intracerebral hemorrhage) (HCC) 07/25/2022   Shoulder pain, right 09/15/2021   H/O: hysterectomy 09/12/2021   Chest pain due to GERD 09/08/2021   Acute cystitis with hematuria 06/30/2019   History of total knee replacement, 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   Obesity (BMI 30-39.9) 05/11/2012   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   Asthma 12/29/2009   Osteoarthritis of right knee 08/26/2009   ALLERGIC RHINITIS 05/29/2007   SICKLE CELL TRAIT 05/13/2006    Orientation RESPIRATION BLADDER Height & Weight     Self, Time, Situation, Place  Normal Continent, External catheter Weight: 192 lb 3.9 oz (87.2 kg) Height:  5\' 6"  (167.6 cm)  BEHAVIORAL SYMPTOMS/MOOD NEUROLOGICAL BOWEL NUTRITION STATUS      Continent Diet  AMBULATORY STATUS COMMUNICATION OF NEEDS Skin   Extensive Assist Verbally Normal                       Personal Care Assistance Level of Assistance  Bathing, Feeding, Dressing Bathing Assistance: Maximum assistance Feeding assistance: Limited assistance Dressing Assistance: Maximum assistance     Functional Limitations Info  Sight, Speech, Hearing Sight  Info: Adequate Hearing Info: Adequate Speech Info: Adequate    SPECIAL CARE FACTORS FREQUENCY  PT (By licensed PT), OT (By licensed OT)     PT Frequency: 5x weekly OT Frequency: 5x weekly            Contractures Contractures Info: Not present    Additional Factors Info  Allergies, Code Status Code Status Info: Full Code Allergies Info: Acetaminophen, Morphine And Related Nsaids           Current Medications (07/27/2022):  This is the current hospital active medication list Current Facility-Administered Medications  Medication Dose Route Frequency Provider Last Rate Last Admin   0.9 %  sodium chloride infusion   Intravenous Continuous Marvel Plan, MD 50 mL/hr at 07/27/22 1300 Infusion Verify at 07/27/22 1300   albuterol (PROVENTIL) (2.5 MG/3ML) 0.083% nebulizer solution 2.5 mg  2.5 mg Nebulization Q6H PRN Reome, Earle J, RPH       Chlorhexidine Gluconate Cloth 2 % PADS 6 each  6 each Topical Q0600 Erick Blinks, MD   6 each at 07/26/22 2127   clevidipine (CLEVIPREX) infusion 0.5 mg/mL  0-21 mg/hr Intravenous Continuous Marvel Plan, MD   Stopped at 07/27/22 0943   heparin injection 5,000 Units  5,000 Units Subcutaneous Q8H Marvel Plan, MD       labetalol (NORMODYNE) injection 10 mg  10 mg Intravenous Q2H PRN Erick Blinks, MD       Oral care mouth rinse  15 mL Mouth Rinse 4 times per day Erick Blinks, MD   15  mL at 07/27/22 1224   Oral care mouth rinse  15 mL Mouth Rinse PRN Erick Blinks, MD       Oral care mouth rinse  15 mL Mouth Rinse PRN Erick Blinks, MD       pantoprazole (PROTONIX) injection 40 mg  40 mg Intravenous QHS Erick Blinks, MD   40 mg at 07/26/22 2107   senna-docusate (Senokot-S) tablet 1 tablet  1 tablet Per Tube BID Doristine Counter, RPH       sodium chloride flush (NS) 0.9 % injection 10-40 mL  10-40 mL Intracatheter Q12H Marvel Plan, MD   10 mL at 07/26/22 2108   sodium chloride flush (NS) 0.9 % injection 10-40 mL  10-40 mL  Intracatheter PRN Marvel Plan, MD         Discharge Medications: Please see discharge summary for a list of discharge medications.  Relevant Imaging Results:  Relevant Lab Results:   Additional Information SSN: [id number]  Inis Sizer, LCSW

## 2022-07-27 NOTE — Procedures (Signed)
Cortrak  Leffel Inserting Tube:  Greig Castilla D, RD Tube Type:  Cortrak - 43 inches Tube Size:  10 Tube Location:  Left nare Secured by: Bridle Technique Used to Measure Tube Placement:  Marking at nare/corner of mouth Cortrak Secured At:  61 cm Procedure Comments:  Cortrak Tube Team Note:  Consult received to place a Cortrak feeding tube.   X-ray is required, abdominal x-ray has been ordered by the Cortrak team. Please confirm tube placement before using the Cortrak tube.   If the tube becomes dislodged please keep the tube and contact the Cortrak team at [email or web address] for replacement.  If after hours and replacement cannot be delayed, place a NG tube and confirm placement with an abdominal x-ray.    Greig Castilla, RD, LDN Clinical Dietitian RD pager # available in [facility]  After hours/weekend pager # available in AMION\

## 2022-07-27 NOTE — Progress Notes (Signed)
Physical Therapy Treatment Patient Details Name: Anna Berry MRN: [id number] DOB: 1960/09/01 Today's Date: 07/27/2022   History of Present Illness Pt is a 63 y.o. female who presented 07/25/22 with speech difficulties. MRI showed a L frontal ICH. Worsening neuro decline noted 5/11 after admitting pt. Repeat imaging showed new area of ICH lateral to current ICH with MLS 5 mm. PMH: Sickle cell trait, sigmoid diverticulosis, asthma, OA, obesity, pre-diabetes    PT Comments    Patient with small progression with sit to stand this session and able to initiate slightly more erect posture when supported and son standing in front.  Not initiating much else and not following commands possibly with communication deficits.  PT will continue to follow and progress OOB as able.    Recommendations for follow up therapy are one component of a multi-disciplinary discharge planning process, led by the attending physician.  Recommendations may be updated based on patient status, additional functional criteria and insurance authorization.  Follow Up Recommendations       Assistance Recommended at Discharge Frequent or constant Supervision/Assistance  Patient can return home with the following Two people to help with walking and/or transfers;A lot of help with bathing/dressing/bathroom;Assistance with cooking/housework;Assistance with feeding;Direct supervision/assist for medications management;Direct supervision/assist for financial management;Assist for transportation;Help with stairs or ramp for entrance   Equipment Recommendations  Other (comment) (TBA)    Recommendations for Other Services       Precautions / Restrictions Precautions Precautions: Fall Precaution Comments: L mitten; SBP < 130-150 goal Restrictions Weight Bearing Restrictions: No     Mobility  Bed Mobility Overal bed mobility: Needs Assistance Bed Mobility: Rolling, Sidelying to Sit, Sit to Supine Rolling: Total assist, +2  for physical assistance, +2 for safety/equipment Sidelying to sit: Total assist, +2 for physical assistance, +2 for safety/equipment   Sit to supine: Total assist, +2 for physical assistance, +2 for safety/equipment   General bed mobility comments: dependent on therapists for all aspects of bed mobility, pt adjusting LUE minimally however not using functionally to assist with mobility    Transfers Overall transfer level: Needs assistance Equipment used: 2 [place] hand held assist Transfers: Sit to/from Stand Sit to Stand: Total assist, +2 safety/equipment, +2 physical assistance           General transfer comment: stood x 2 with A for R knee blocking and hip extension son in front to encourage upright head and posture    Ambulation/Gait                   Stairs             Wheelchair Mobility    Modified Rankin (Stroke Patients Only) Modified Rankin (Stroke Patients Only) Pre-Morbid Rankin Score: No symptoms Modified Rankin: Severe disability     Balance Overall balance assessment: Needs assistance Sitting-balance support: Single extremity supported, No upper extremity supported, Feet supported Sitting balance-Leahy Scale: Poor Sitting balance - Comments: pushing with LUE to the R Postural control: Right lateral lean Standing balance support: Bilateral upper extremity supported, During functional activity Standing balance-Leahy Scale: Zero Standing balance comment: total A +2 to stand                            Cognition Arousal/Alertness: Lethargic Behavior During Therapy: Flat affect Overall Cognitive Status: Impaired/Different from baseline Area of Impairment: Attention, Following commands, Awareness  Current Attention Level: Focused   Following Commands: Follows one step commands inconsistently   Awareness: Intellectual   General Comments: Lethargic, requires cues and positional changes to maintain eyes  open.        Exercises      General Comments General comments (skin integrity, edema, etc.): VSS SBP 134 prior to session, 145 after bed mobility and 149 after sit<>stand x 2      Pertinent Vitals/Pain Pain Assessment Faces Pain Scale: No hurt    Home Living Family/patient expects to be discharged to:: Private residence Living Arrangements: Spouse/significant other Available Help at Discharge: Family;Available PRN/intermittently Type of Home: House Home Access: Stairs to enter Entrance Stairs-Rails: None Entrance Stairs-Number of Steps: 2   Home Layout: One level Home Equipment: None Additional Comments: Husband uses a cane currently after just recently discharging hospital due to hypoglycemic event    Prior Function            PT Goals (current goals can now be found in the care plan section) Progress towards PT goals: Progressing toward goals    Frequency    Min 4X/week      PT Plan Current plan remains appropriate    Co-evaluation   Reason for Co-Treatment: Complexity of the patient's impairments (multi-system involvement);For patient/therapist safety;To address functional/ADL transfers   OT goals addressed during session: ADL's and self-care      AM-PAC PT "6 Clicks" Mobility   Outcome Measure  Help needed turning from your back to your side while in a flat bed without using bedrails?: Total Help needed moving from lying on your back to sitting on the side of a flat bed without using bedrails?: Total Help needed moving to and from a bed to a chair (including a wheelchair)?: Total Help needed standing up from a chair using your arms (e.g., wheelchair or bedside chair)?: Total Help needed to walk in hospital room?: Total Help needed climbing 3-5 steps with a railing? : Total 6 Click Score: 6    End of Session Equipment Utilized During Treatment: Gait belt Activity Tolerance: Patient limited by lethargy Patient left: in bed;with bed alarm set;with  call bell/phone within reach;with SCD's reapplied;with restraints reapplied;with family/visitor present   PT Visit Diagnosis: Unsteadiness on feet (R26.81);Muscle weakness (generalized) (M62.81);Difficulty in walking, not elsewhere classified (R26.2);Other symptoms and signs involving the nervous system (R29.898);Hemiplegia and hemiparesis Hemiplegia - Right/Left: Right Hemiplegia - dominant/non-dominant: Dominant Hemiplegia - caused by: Other Nontraumatic intracranial hemorrhage     Time: 1244-1311 PT Time Calculation (min) (ACUTE ONLY): 27 min  Charges:  $Therapeutic Activity: 8-22 mins                     Sheran Lawless, PT Acute Rehabilitation Services [phone] 07/27/2022    Elray Mcgregor 07/27/2022, 4:36 PM

## 2022-07-27 NOTE — Progress Notes (Signed)
Inpatient Rehab Admissions Coordinator:   Per PT recommendation,  patient was screened for CIR candidacy by Megan Salon, MS, CCC-SLP. [name] insurance is not in network with CIR. If AIR is desired, TOC will need to fax out to in network AIRs. Please contact me any with questions.  Megan Salon, MS, CCC-SLP Rehab Admissions Coordinator  [phone] (celll) [phone] (office)

## 2022-07-27 NOTE — Evaluation (Signed)
Occupational Therapy Evaluation Patient Details Name: Anna Berry MRN: [id number] DOB: 08/23/1960 Today's Date: 07/27/2022   History of Present Illness Pt is a 62 y.o. female who presented 07/25/22 with speech difficulties. MRI showed a L frontal ICH. Worsening neuro decline noted 5/11 after admitting pt. Repeat imaging showed new area of ICH lateral to current ICH with MLS 5 mm. PMH: Sickle cell trait, sigmoid diverticulosis, asthma, OA, obesity, pre-diabetes   Clinical Impression   Anna Berry was evaluated s/p the above admission list. Per report and chart review, pt is indep at baseline. Upon evaluation she was limited by lethargy, R hemi body weakness, impaired communication and cognition, poor sitting balance and decreased activity tolerance. Overall she requires total A for bed motility and for x2 standing EOB with family standing in front of her to facilitate upright posture. Due to the deficits listed below she also needs total A for all aspects of ADLs. Pt will benefit from continued acute OT services and intensive inpatient follow up therapy, >3 hours/day after discharge.       Recommendations for follow up therapy are one component of a multi-disciplinary discharge planning process, led by the attending physician.  Recommendations may be updated based on patient status, additional functional criteria and insurance authorization.   Assistance Recommended at Discharge Frequent or constant Supervision/Assistance  Patient can return home with the following A lot of help with walking and/or transfers;Two people to help with walking and/or transfers;A lot of help with bathing/dressing/bathroom;Two people to help with bathing/dressing/bathroom;Assistance with cooking/housework;Direct supervision/assist for medications management;Assistance with feeding;Assist for transportation;Direct supervision/assist for financial management;Help with stairs or ramp for entrance    Functional Status  Assessment  Patient has had a recent decline in their functional status and demonstrates the ability to make significant improvements in function in a reasonable and predictable amount of time.  Equipment Recommendations  Other (comment) (defer)    Recommendations for Other Services Rehab consult     Precautions / Restrictions Precautions Precautions: Fall;Other (comment) Precaution Comments: L mitten; SBP < 130-150 goal Restrictions Weight Bearing Restrictions: No      Mobility Bed Mobility Overal bed mobility: Needs Assistance Bed Mobility: Rolling, Sidelying to Sit, Sit to Supine Rolling: Total assist, +2 for physical assistance, +2 for safety/equipment Sidelying to sit: Total assist, +2 for physical assistance, +2 for safety/equipment   Sit to supine: Total assist, +2 for physical assistance, +2 for safety/equipment   General bed mobility comments: dependent on therapists for all aspects of bed mobility, pt adjusting LUE minimally however not using functionally to assist with mobility    Transfers Overall transfer level: Needs assistance Equipment used: 2 [place] hand held assist Transfers: Sit to/from Stand Sit to Stand: Total assist, +2 safety/equipment, +2 physical assistance           General transfer comment: x2      Balance Overall balance assessment: Needs assistance Sitting-balance support: Single extremity supported, No upper extremity supported, Feet supported Sitting balance-Leahy Scale: Poor Sitting balance - Comments: pushing with LUE to the R Postural control: Right lateral lean Standing balance support: Bilateral upper extremity supported, During functional activity Standing balance-Leahy Scale: Zero Standing balance comment: total A +2 to stand                           ADL either performed or assessed with clinical judgement   ADL Overall ADL's : Needs assistance/impaired Eating/Feeding: NPO  Functional mobility during ADLs: Total assistance;+2 for safety/equipment;+2 for physical assistance General ADL Comments: total A for all aspects of her care at bed level     Vision Baseline Vision/History: 0 No visual deficits Vision Assessment?: Vision impaired- to be further tested in functional context Additional Comments: unable to formally assess     Perception Perception Perception Tested?: No   Praxis Praxis Praxis tested?: Not tested    Pertinent Vitals/Pain Pain Assessment Pain Assessment: Faces Faces Pain Scale: No hurt Pain Intervention(s): Monitored during session     Hand Dominance Right   Extremity/Trunk Assessment Upper Extremity Assessment Upper Extremity Assessment: RUE deficits/detail;LUE deficits/detail RUE Deficits / Details: difficult to assess due to impiared cog and communication - flaccid. no activation noted LUE Deficits / Details: difficult to fully assess - moving against gravity and using to push in sitting   Lower Extremity Assessment Lower Extremity Assessment: Defer to PT evaluation   Cervical / Trunk Assessment Cervical / Trunk Assessment: Normal   Communication Communication Communication: Receptive difficulties;Expressive difficulties   Cognition Arousal/Alertness: Lethargic Behavior During Therapy: Flat affect Overall Cognitive Status: Impaired/Different from baseline Area of Impairment: Attention, Following commands, Awareness                   Current Attention Level: Focused   Following Commands: Follows one step commands inconsistently   Awareness: Intellectual   General Comments: Lethargic, requires cues and positional changes to maintain eyes open.     General Comments  VSS  SBP prior to session 134, SBP after bed mobility 145, SBP after standing x2 149.    Exercises     Shoulder Instructions      Home Living Family/patient expects to be discharged to:: Private residence Living Arrangements:  Spouse/significant other Available Help at Discharge: Family;Available PRN/intermittently Type of Home: House Home Access: Stairs to enter [employer] of Steps: 2 Entrance Stairs-Rails: None Home Layout: One level     Bathroom Shower/Tub: Producer, television/film/video: Standard     Home Equipment: None   Additional Comments: Husband uses a cane currently after just recently discharging hospital due to hypoglycemic event      Prior Functioning/Environment Prior Level of Function : Independent/Modified Independent;Working/employed;Driving             Mobility Comments: No AD ADLs 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 [employer]        OT Problem List: Decreased strength;Decreased range of motion;Decreased knowledge of use of DME or AE;Impaired balance (sitting and/or standing);Decreased activity tolerance;Decreased safety awareness;Decreased cognition;Decreased coordination;Decreased knowledge of precautions;Cardiopulmonary status limiting activity;Impaired sensation;Impaired tone;Impaired UE functional use;Pain      OT Treatment/Interventions: Therapeutic exercise;Self-care/ADL training;Neuromuscular education;DME and/or AE instruction;Therapeutic activities;Patient/family education;Balance training    OT Goals(Current goals can be found in the care plan section) Acute Rehab OT Goals Patient Stated Goal: unable to state OT Goal Formulation: Patient unable to participate in goal setting Time For Goal Achievement: 08/10/22 Potential to Achieve Goals: Good ADL Goals Pt Will Perform Grooming: with mod assist;sitting Pt Will Perform Upper Body Dressing: with mod assist;sitting Pt Will Transfer to Toilet: with max assist;stand pivot transfer;bedside commode Additional ADL Goal #1: Pt will complete bed mobility with mod A  as a precursor to ADLs Additional ADL Goal #2: Pt will follow simple 1 step commands to sequence through ADL task with min cues  OT Frequency: Min  2X/week    Co-evaluation PT/OT/SLP Co-Evaluation/Treatment: Yes Reason for Co-Treatment: Complexity of the patient's impairments (multi-system involvement);For  patient/therapist safety;To address functional/ADL transfers   OT goals addressed during session: ADL's and self-care      AM-PAC OT "6 Clicks" Daily Activity     Outcome Measure Help from another [place] eating meals?: Total Help from another Andel taking care of personal grooming?: Total Help from another Tester toileting, which includes using toliet, bedpan, or urinal?: Total Help from another [place]orn bathing (including washing, rinsing, drying)?: Total Help from another Casebolt to put on and taking off regular upper body clothing?: Total Help from another Kretzschmar to put on and taking off regular lower body clothing?: Total 6 Click Score: 6   End of Session Equipment Utilized During Treatment: Gait belt Nurse Communication: Mobility status  Activity Tolerance: Patient tolerated treatment well Patient left: in bed;with call bell/phone within reach;with bed alarm set;with family/visitor present  OT Visit Diagnosis: Unsteadiness on feet (R26.81);Other abnormalities of gait and mobility (R26.89);Muscle weakness (generalized) (M62.81);Hemiplegia and hemiparesis Hemiplegia - Right/Left: Right Hemiplegia - caused by: Other Nontraumatic intracranial hemorrhage                Time: 1247-1311 OT Time Calculation (min): 24 min Charges:  OT General Charges $OT Visit: 1 Visit OT Evaluation $OT Eval Moderate Complexity: 1 Mod  Derenda Mis, OTR/L Acute Rehabilitation Services Office [phone] Secure Chat Communication Preferred   Donia Pounds 07/27/2022, 3:25 PM

## 2022-07-27 NOTE — Progress Notes (Signed)
Speech Language Pathology Treatment: Dysphagia  Patient Details Name: Anna Berry MRN: [id number] DOB: 14-Mar-1961 Today's Date: 07/27/2022 Time: 4098-1191 SLP Time Calculation (min) (ACUTE ONLY): 31 min  Assessment / Plan / Recommendation Clinical Impression  Pt seen for f/u dysphagia tx with primary cognitive-based oropharyngeal dysphagia with pt unable to follow oral commands despite max multimodal cues and slow mastication, anterior R spillage, oral holding and delay in the initiation of the swallow.  Pt also exhibited an immediate cough response with tsp amount of thin liquids.  Pt unable to achieve negative pressure for straw consumption of thin liquids initially, but with max cues, she did achieve this infrequently.  Cup sips of thin led to anterior spillage on R.  Puree and solids resulted in decreased oral manipulation/mastication and multiple swallows post intake.  Pt with intermittent lethargy during po trial which also contributes to risk for aspiration and decreased oral intake with nutrition/hydration limited as a result.  Pt would benefit from an objective assessment of swallowing when mentation improves, but enteral nutrition recommended at this time to maintain adequate nutrition/hydration.  ST will f/u for cog/linguistic and dysphagia tx/management during acute stay.  HPI HPI: Pt is a 62 y.o. female who presented with difficulty speaking. MRI brain 5/11: Large area of hemorrhage (approximately 18 mL) in the left frontal lobe, within the anterior cerebral artery territory. Pt passed [facility] 5/11, but then had neuro changes and could not complete a repeat [facility] due to lethargy. PMH: sickle cell trait, sigmoid diverticulosis, asthma, OA; BSE completed on 07/26/22 with NPO recommendation. ST f/u for SLE and po readiness.      SLP Plan  MBS;Continue with current plan of care      Recommendations for follow up therapy are one component of a multi-disciplinary discharge planning process,  led by the attending physician.  Recommendations may be updated based on patient status, additional functional criteria and insurance authorization.    Recommendations  Diet recommendations: NPO Medication Administration: Via alternative means                Rehab consult Oral care QID;Staff/trained caregiver to provide oral care   Frequent or constant Supervision/Assistance Dysphagia, oropharyngeal phase (R13.12)     MBS;Continue with current plan of care     Pat Anna Berry,M.S., CCC-SLP  07/27/2022, 10:58 AM

## 2022-07-27 NOTE — Progress Notes (Signed)
Echocardiogram 2D Echocardiogram has been performed.  Anna Berry 07/27/2022, 9:43 AM

## 2022-07-27 NOTE — Progress Notes (Signed)
Chaplain responded to a consult request for Advance Directive education per pt's husband's request. Chaplain met with pt's husband and son at bedside. Anna Berry was generally asleep during our visit though she opened her eyes from time to time.   Chaplain provided the Advance Directive packet as well as education on Advance Directives-documents an individual completes to communicate their health care directions in advance of a time when they may need them. Chaplain informed pt the documents which may be completed here in the hospital are the Living Will and Health Care Power of [name]. Mr Anna Berry has myeloma and was curious about the difference between [facility] and DNR.   Chaplain informed that the Health Care Power of Anna Berry is a legal document in which an individual names another [name], their Health Care Agent, to make health care decisions when the individual is not able to make them for themselves. The Health Care Agent's function can be temporary or permanent depending on the pt's ability to make and communicate those decisions independently. Chaplain informed pt in the absence of a Health Care Power of [name], the state of [place] directs health care providers to look to the following individuals in the order listed: legal guardian; an attorney?in?fact under a general power of attorney (POA) if that POA includes the right to make health care decisions; a husband or wife; a majority of parents and adult children; a majority of adult brothers and sisters; or an individual who has an established relationship with you, who is acting in good faith and who can convey your wishes.  If none of these Hair are available or willing to make medical decisions on a patient's behalf, the law allows the patient's doctor to make decisions for them as long as another doctor agrees with those decisions.  Chaplain also informed the patient that the Health Care agent has no decision-making authority over any  affairs other than those related to his or her medical care.   The chaplain further educated the pt that a Living Will is a legal document that allows an individual to state his or her desire not to receive life-prolonging measures in the event that they have a condition that is incurable and will result in their death in a short period of time; they are unconscious, and doctors are confident that they will not regain consciousness; and/or they have advanced dementia or other substantial and irreversible loss of mental function. The chaplain informed pt that life-prolonging measures are medical treatments that would only serve to postpone death, including breathing machines, kidney dialysis, antibiotics, artificial nutrition and hydration (tube feeding), and similar forms of treatment and that if an individual is able to express their wishes, they may also make them known without the use of a Living Will, but in the event that an individual is not able to express their wishes themselves, a Living Will allows medical providers and the pt's family and Berry ensure that they are not making decisions on the pt's behalf, but rather serving as the pt's voice to convey decisions the pt has already made.   The patient is aware that the decision to create an advance directive is theirs alone and they may chose not to complete the documents or may chose to complete one portion or both.  The patient was informed that they can revoke the documents at any time by striking through them and writing void or by completing new documents, but that it is also advisable that the individual verbally notify  interested parties that their wishes have changed.  They are also aware that the document must be signed in the presence of a notary public and two witnesses and that this can be done while the patient is still admitted to the hospital or after discharge in the community. If they decide to complete Advance Directives after  being discharged from the hospital, they have been advised to notify all interested parties and to provide those documents to their physicians and loved ones in addition to bringing them to the hospital in the event of another hospitalization.   The chaplain informed the pt that if they desire to proceed with completing Advance Directive Documentation while they are still admitted, notary services are typically available at [facility] between the hours of 1:00 and 3:30 Monday-Thursday.    When the patient is ready to have these documents completed, the patient should request that their nurse place a spiritual care consult and indicate that the patient is ready to have their advance directives notarized so that arrangements for witnesses and notary public can be made.  Please page spiritual care if the patient desires further education or has questions.     Marland KitchenUlyses Southward     07/27/22 1227  Spiritual Encounters  Type of Visit Initial  Care provided to: Family  Referral source Family  Reason for visit Advance directives  OnCall Visit Yes  Interventions  Spiritual Care Interventions Made Established relationship of care and support  Advance Directives (For Healthcare)  Does Patient Have a Medical Advance Directive? No  Would patient like information on creating a medical advance directive? Yes (Inpatient - patient defers creating a medical advance directive at this time - Information given)

## 2022-07-27 NOTE — Progress Notes (Signed)
Initial Nutrition Assessment  DOCUMENTATION CODES:   Not applicable  INTERVENTION:   Once Cortrak placed and placement confirmed with abdominal x-ray, initiate tube feeds: - Start Jevity 1.5 @ 20 ml/hr and advance rate by 10 ml q 6 hours to goal rate of 50 ml/hr (1200 ml/day) - PROSource TF20 60 ml daily  Tube feeding regimen at goal rate provides 1880 kcal, 96.5 grams of protein, and 912 ml of H2O.  - Once IV fluids are discontinued, recommend addition of free water flushes of 150 ml q 4 hours for a total of 1812 ml free water daily  NUTRITION DIAGNOSIS:   Inadequate oral intake related to lethargy/confusion, inability to eat as evidenced by NPO status.  GOAL:   Patient will meet greater than or equal to 90% of their needs  MONITOR:   Diet advancement, Labs, Weight trends, TF tolerance  REASON FOR ASSESSMENT:   Consult Enteral/tube feeding initiation and management  ASSESSMENT:   62 year old female who presented to the ED on 5/10 as a Code Stroke. PMH of sickle cell trait, sigmoid diverticulosis, asthma, OA. Pt admitted with L frontal ICH with L ACA distribution, concerning for hemorrhagic transformation.  05/11 - new L frontal ICH alteral to previous hematoma 05/13 - SLP recommending NPO, Cortrak placement pending  Discussed pt with RN and during ICU rounds. Consult received for enteral nutrition initiation and management. Plan for Cortrak placement today. Cleviprex off this AM.  Spoke with pt's son and husband at bedside. Explained plan for temporary NG tube placement using Cortrak at bedside. Process explained and questions answered.  [name] husband reports that pt had a good appetite PTA and would typically eat 2-3 meals daily. A typical meal may include a bagel with sausage or bacon.  [name] husband is concerned that pt has lost some weight over the last few days. Pt last ate on Friday. He has noticed some weight loss when looking at pt's face. Pt with a ~3 kg weight  loss since admission; pt is also net negative 1.6 L. Reviewed weight history in chart. Current weight is stable compared to weight from January 2024 and June/July 2023. Pt with mild pitting generalized edema and mild pitting edema to BLE.  Admit weight: 90.4 kg Current weight: 87.2 kg  Medications reviewed and include: IV protonix, senna IVF: NS @ 50 ml/hr  Labs reviewed: WBC 10.7 CBG's: 90-154 x 24 hours  UOP: 1700 ml x 24 hours I/O's: -1.6 L since admit  NUTRITION - FOCUSED PHYSICAL EXAM:  Flowsheet Row Most Recent Value  Orbital Region Moderate depletion  Upper Arm Region No depletion  Thoracic and Lumbar Region No depletion  Buccal Region Mild depletion  Temple Region No depletion  Clavicle Bone Region No depletion  Clavicle and Acromion Bone Region Mild depletion  Scapular Bone Region No depletion  Dorsal Hand No depletion  Patellar Region No depletion  Anterior Thigh Region No depletion  Posterior Calf Region No depletion  Edema (RD Assessment) Mild  [BLE]  Hair Reviewed  Eyes Reviewed  Mouth Reviewed  Skin Reviewed  Nails Reviewed       Diet Order:   Diet Order             Diet NPO time specified  Diet effective now                   EDUCATION NEEDS:   Education needs have been addressed  Skin:  Skin Assessment: Reviewed RN Assessment  Last BM:  07/25/22 small  type 5  Height:   Ht Readings from Last 1 Encounters:  07/25/22 5\' 6"  (1.676 m)    Weight:   Wt Readings from Last 1 Encounters:  07/27/22 87.2 kg    BMI:  Body mass index is 31.03 kg/m.  Estimated Nutritional Needs:   Kcal:  1750-1950  Protein:  90-110 grams  Fluid:  1.7-1.9 L    [name], MS, RD, LDN Inpatient Clinical Dietitian Please see AMiON for contact information.

## 2022-07-27 NOTE — Progress Notes (Addendum)
STROKE TEAM PROGRESS NOTE   SUBJECTIVE (INTERVAL HISTORY)  Patient evaluated in her room with 2 family members present (husband and son) at bedside. She remains hemodynamically stable. She is not very interactive on exam today but neurological exam remains stable. She did not pass swallow evaluation and cortrak has been ordered for nutrition and medications.  OBJECTIVE Temp:  [98.5 F (36.9 C)-99.3 F (37.4 C)] 99.2 F (37.3 C) (05/13 1107) Pulse Rate:  [62-120] 65 (05/13 1330) Cardiac Rhythm: Normal sinus rhythm (05/13 1100) Resp:  [14-34] 24 (05/13 1330) BP: (96-160)/(61-96) 130/75 (05/13 1330) SpO2:  [94 %-100 %] 96 % (05/13 1330) Weight:  [87.2 kg] 87.2 kg (05/13 0545)  Recent Labs  Lab 07/25/22 1532 07/25/22 1912 07/25/22 2253 07/26/22 0255 07/26/22 1921  GLUCAP 131* 147* 154* 118* 113*   Recent Labs  Lab 07/24/22 2035 07/24/22 2040 07/26/22 0255 07/27/22 0415  NA 140 140 143 141  K 4.3 4.2 3.3* 3.5  CL 105 108 107 112*  CO2 24  --  26 23  GLUCOSE 107* 109* 133* 119*  BUN 10 14 9 9   CREATININE 1.12* 1.10* 0.94 0.91  CALCIUM 9.5  --  9.3 9.1   Recent Labs  Lab 07/24/22 2035  AST 27  ALT 19  ALKPHOS 79  BILITOT 0.5  PROT 7.2  ALBUMIN 4.2   Recent Labs  Lab 07/24/22 2035 07/24/22 2040 07/26/22 0255 07/27/22 0415  WBC 11.3*  --  10.6* 10.7*  NEUTROABS 8.6*  --   --   --   HGB 13.3 13.6 12.5 13.7  HCT 42.5 40.0 37.4 41.7  MCV 90.0  --  84.2 84.8  PLT 268  --  256 241   No results for input(s): "CKTOTAL", "CKMB", "CKMBINDEX", "TROPONINI" in the last 168 hours. Recent Labs    07/24/22 2035  LABPROT 14.2  INR 1.1   No results for input(s): "COLORURINE", "LABSPEC", "PHURINE", "GLUCOSEU", "HGBUR", "BILIRUBINUR", "KETONESUR", "PROTEINUR", "UROBILINOGEN", "NITRITE", "LEUKOCYTESUR" in the last 72 hours.  Invalid input(s): "APPERANCEUR"     Component Value Date/Time   CHOL 164 07/25/2022 0623   CHOL 155 09/12/2021 1509   TRIG 52 07/25/2022 0623    HDL 49 07/25/2022 0623   HDL 46 09/12/2021 1509   CHOLHDL 3.3 07/25/2022 0623   VLDL 10 07/25/2022 0623   LDLCALC 105 (H) 07/25/2022 0623   LDLCALC 85 09/12/2021 1509   Lab Results  Component Value Date   HGBA1C 6.0 (H) 07/25/2022      Component Value Date/Time   LABOPIA NONE DETECTED 07/26/2022 1850   COCAINSCRNUR NONE DETECTED 07/26/2022 1850   LABBENZ NONE DETECTED 07/26/2022 1850   AMPHETMU NONE DETECTED 07/26/2022 1850   THCU NONE DETECTED 07/26/2022 1850   LABBARB NONE DETECTED 07/26/2022 1850    Recent Labs  Lab 07/24/22 2035  ETH <10    I have personally reviewed the radiological images below and agree with the radiology interpretations.  ECHOCARDIOGRAM COMPLETE  Result Date: 07/27/2022    ECHOCARDIOGRAM REPORT   Patient Name:   JAILEIGH TRACHTMAN Spates Date of Exam: 07/27/2022 Medical Rec #:  [id number]        Height:       66.0 in Accession #:    [id number]       Weight:       192.2 lb Date of Birth:  [birth year]       BSA:          1.966 m Patient Age:    62 years  BP:           133/74 mmHg Patient Gender: F                HR:           79 bpm. Exam Location:  Inpatient Procedure: 2D Echo, Cardiac Doppler and Color Doppler Indications:    Stroke  History:        Patient has no prior history of Echocardiogram examinations.                 Sickel Cell Trait.  Sonographer:    Raeford Razor Referring Phys: [id number] [facility] KHALIQDINA IMPRESSIONS  1. Left ventricular ejection fraction, by estimation, is 55 to 60%. The left ventricle has normal function. The left ventricle has no regional wall motion abnormalities. Left ventricular diastolic parameters are consistent with Grade I diastolic dysfunction (impaired relaxation).  2. Right ventricular systolic function is normal. The right ventricular size is normal.  3. The mitral valve is normal in structure. No evidence of mitral valve regurgitation. No evidence of mitral stenosis.  4. The aortic valve is tricuspid. Aortic valve regurgitation  is not visualized. No aortic stenosis is present.  5. The inferior vena cava is normal in size with greater than 50% respiratory variability, suggesting right atrial pressure of 3 mmHg. Comparison(s): No prior Echocardiogram. FINDINGS  Left Ventricle: Left ventricular ejection fraction, by estimation, is 55 to 60%. The left ventricle has normal function. The left ventricle has no regional wall motion abnormalities. The left ventricular internal cavity size was normal in size. There is  no left ventricular hypertrophy. Left ventricular diastolic parameters are consistent with Grade I diastolic dysfunction (impaired relaxation). Right Ventricle: The right ventricular size is normal. Right ventricular systolic function is normal. Left Atrium: Left atrial size was normal in size. Right Atrium: Right atrial size was normal in size. Pericardium: There is no evidence of pericardial effusion. Mitral Valve: The mitral valve is normal in structure. No evidence of mitral valve regurgitation. No evidence of mitral valve stenosis. Tricuspid Valve: The tricuspid valve is normal in structure. Tricuspid valve regurgitation is trivial. No evidence of tricuspid stenosis. Aortic Valve: The aortic valve is tricuspid. Aortic valve regurgitation is not visualized. No aortic stenosis is present. Aortic valve mean gradient measures 5.0 mmHg. Aortic valve peak gradient measures 9.7 mmHg. Aortic valve area, by VTI measures 2.48 cm. Pulmonic Valve: The pulmonic valve was not well visualized. Pulmonic valve regurgitation is not visualized. No evidence of pulmonic stenosis. Aorta: The aortic root is normal in size and structure. Venous: The inferior vena cava is normal in size with greater than 50% respiratory variability, suggesting right atrial pressure of 3 mmHg. IAS/Shunts: No atrial level shunt detected by color flow Doppler.  LEFT VENTRICLE PLAX 2D LVIDd:         3.90 cm   Diastology LVIDs:         3.00 cm   LV e' medial:    6.96 cm/s LV  PW:         0.90 cm   LV E/e' medial:  12.8 LV IVS:        0.60 cm   LV e' lateral:   11.30 cm/s LVOT diam:     2.10 cm   LV E/e' lateral: 7.9 LV SV:         62 LV SV Index:   31 LVOT Area:     3.46 cm  RIGHT VENTRICLE  IVC RV Basal diam:  2.00 cm     IVC diam: 1.00 cm RV S prime:     20.10 cm/s TAPSE (M-mode): 2.5 cm LEFT ATRIUM             Index        RIGHT ATRIUM           Index LA diam:        3.20 cm 1.63 cm/m   RA Area:     11.40 cm LA Vol (A2C):   27.1 ml 13.78 ml/m  RA Volume:   24.10 ml  12.26 ml/m LA Vol (A4C):   40.0 ml 20.34 ml/m LA Biplane Vol: 36.8 ml 18.71 ml/m  AORTIC VALVE AV Area (Vmax):    2.53 cm AV Area (Vmean):   2.38 cm AV Area (VTI):     2.48 cm AV Vmax:           156.00 cm/s AV Vmean:          108.000 cm/s AV VTI:            0.249 m AV Peak Grad:      9.7 mmHg AV Mean Grad:      5.0 mmHg LVOT Vmax:         114.00 cm/s LVOT Vmean:        74.100 cm/s LVOT VTI:          0.178 m LVOT/AV VTI ratio: 0.71  AORTA Ao Root diam: 3.20 cm Ao Asc diam:  3.20 cm MITRAL VALVE                TRICUSPID VALVE MV Area (PHT): 5.79 cm     TR Peak grad:   23.6 mmHg MV E velocity: 88.80 cm/s   TR Vmax:        243.00 cm/s MV A velocity: 130.00 cm/s MV E/A ratio:  0.68         SHUNTS                             Systemic VTI:  0.18 m                             Systemic Diam: 2.10 cm Olga Millers MD Electronically signed by Olga Millers MD Signature Date/Time: 07/27/2022/12:38:46 PM    Final    CT HEAD WO CONTRAST ( )  Result Date: 07/27/2022 CLINICAL DATA:  Hemorrhagic stroke follow-up. EXAM: CT HEAD WITHOUT CONTRAST TECHNIQUE: Contiguous axial images were obtained from the base of the skull through the vertex without intravenous contrast. RADIATION DOSE REDUCTION: This exam was performed according to the departmental dose-optimization program which includes automated exposure control, adjustment of the mA and/or kV according to patient size and/or use of iterative reconstruction  technique. COMPARISON:  Two days ago FINDINGS: Brain: Regional hemorrhage in edema facet patchy hemorrhage in regional edema in the left frontal lobe is unchanged with small volume subarachnoid hemorrhage descending the inter hemispheric fissure. Close proximity to the left lateral ventricle but no intraventricular extension. No hydrocephalus. Midline shift still measures 5 mm at the level of the septum pellucidum. Vascular: No hyperdense vessel or unexpected calcification. Skull: Normal. Negative for fracture or focal lesion. Sinuses/Orbits: No acute finding. IMPRESSION: Unchanged left frontal hemorrhage and edema with mild subarachnoid extension. No new or progressive finding. Midline shift measures 5 mm. Electronically Signed   By: Christiane Ha  Watts M.D.   On: 07/27/2022 05:43   EEG adult  Result Date: 07/25/2022 Jefferson Fuel, MD     07/25/2022  8:18 PM Routine EEG Report Gilma Bias Hellickson is a 62 y.o. female with a history of intracranial hemorrhage and altered mental status who is undergoing an EEG to evaluate for seizures. Report: This EEG was acquired with electrodes placed according to the International 10-20 electrode system (including Fp1, Fp2, F3, F4, C3, C4, P3, P4, O1, O2, T3, T4, T5, T6, A1, A2, Fz, Cz, Pz). The following electrodes were missing or displaced: none. The occipital dominant rhythm was 7 Hz. This activity is reactive to stimulation. Drowsiness was manifested by background fragmentation; deeper stages of sleep were not identified. There was focal slowing over the left frontal region. There were no interictal epileptiform discharges. There were no electrographic seizures identified. Photic stimulation and hyperventilation were not performed. Impression and clinical correlation: This EEG was obtained while awake and drowsy and is abnormal due to mild diffuse slowing indicative of global cerebral dysfunction and left frontal slowing over the area of intracranial hemorrhage. Epileptiform  abnormalities were not seen during this recording. Bing Neighbors, MD [facility] [phone] If 7pm- 7am, please page neurology on call as listed in AMION.   CT HEAD WO CONTRAST ( )  Result Date: 07/25/2022 CLINICAL DATA:  Intracranial hemorrhage follow up EXAM: CT HEAD WITHOUT CONTRAST TECHNIQUE: Contiguous axial images were obtained from the base of the skull through the vertex without intravenous contrast. RADIATION DOSE REDUCTION: This exam was performed according to the departmental dose-optimization program which includes automated exposure control, adjustment of the mA and/or kV according to patient size and/or use of iterative reconstruction technique. COMPARISON:  07/25/2022 at 9:04 a.m. FINDINGS: Brain: Compared to the most recent CT, the large mixed intra-axial and extra-axial hematoma at the left frontal lobe is unchanged. There is persistent rightward midline shift that measures 5 mm. Vascular: No hyperdense vessel or unexpected calcification. Skull: Normal. Negative for fracture or focal lesion. Sinuses/Orbits: No acute finding. Other: None. IMPRESSION: Unchanged large mixed intra- and extra-axial hematoma at the left frontal lobe with persistent rightward midline shift that measures 5 mm. Electronically Signed   By: Deatra Robinson M.D.   On: 07/25/2022 20:10   [place] EKG SITE RITE  Result Date: 07/25/2022 If Site Rite image not attached, placement could not be confirmed due to current cardiac rhythm.  CT HEAD WO CONTRAST  Result Date: 07/25/2022 CLINICAL DATA:  Stroke follow-up EXAM: CT HEAD WITHOUT CONTRAST TECHNIQUE: Contiguous axial images were obtained from the base of the skull through the vertex without intravenous contrast. RADIATION DOSE REDUCTION: This exam was performed according to the departmental dose-optimization program which includes automated exposure control, adjustment of the mA and/or kV according to patient size and/or use of iterative reconstruction  technique. COMPARISON:  CT and CTA from earlier today FINDINGS: Brain: Superior left frontal/parasagittal hematoma with progression in the adjacent frontal white matter. Hemorrhage is seen in both the parenchymal and subarachnoid compartments with mild brain edema. The area of newly affected brain had a normal appearance on the prior brain MRI. The CTV adn MRI are again reviewed and the superior sagittal sinus is patent and there is no detectable cortical vein thrombus. The new area of hemorrhage measures 3.4 x 1.8 x 1.7 cm. Rightward midline shift measures 5 mm. No hydrocephalus. Vascular: No hyperdense vessel or unexpected calcification. Skull: Normal. Negative for fracture or focal lesion. Sinuses/Orbits: No acute finding. Dr. Roda Shutters is aware  of the findings at time of signing. IMPRESSION: Progression of left frontal ICH with new hematoma lateral to the presenting hematoma in the frontal lobe. Midline shift now measures 5 mm. Electronically Signed   By: Tiburcio Pea M.D.   On: 07/25/2022 09:37   CT VENOGRAM HEAD  Result Date: 07/25/2022 CLINICAL DATA:  ICH EXAM: CT VENOGRAM HEAD TECHNIQUE: Venographic phase images of the brain were obtained following the administration of intravenous contrast. Multiplanar reformats and maximum intensity projections were generated. RADIATION DOSE REDUCTION: This exam was performed according to the departmental dose-optimization program which includes automated exposure control, adjustment of the mA and/or kV according to patient size and/or use of iterative reconstruction technique. CONTRAST:  75mL OMNIPAQUE IOHEXOL 350 MG/ML SOLN COMPARISON:  Head CT and brain MRI from earlier today FINDINGS: No dural venous sinus thrombosis. The superior sagittal sinus is diffusely patent, including adjacent to the known left frontal hemorrhage. Deep and cortical veins also show no signs of thrombosis. The left transverse sigmoid outflow is dominant without stricture or other filling defect  seen. No masslike enhancement at the hematoma. Follow-up after hemorrhage resolution may be useful in detecting compressed vascular lesion. IMPRESSION: Negative for dural venous sinus thrombosis. No vascular lesion seen underlying the hemorrhage. Electronically Signed   By: Tiburcio Pea M.D.   On: 07/25/2022 05:10   CT ANGIO HEAD NECK W WO CM  Result Date: 07/25/2022 CLINICAL DATA:  Hemorrhagic stroke. EXAM: CT ANGIOGRAPHY HEAD AND NECK WITH AND WITHOUT CONTRAST TECHNIQUE: Multidetector CT imaging of the head and neck was performed using the standard protocol during bolus administration of intravenous contrast. Multiplanar CT image reconstructions and MIPs were obtained to evaluate the vascular anatomy. Carotid stenosis measurements (when applicable) are obtained utilizing NASCET criteria, using the distal internal carotid diameter as the denominator. RADIATION DOSE REDUCTION: This exam was performed according to the departmental dose-optimization program which includes automated exposure control, adjustment of the mA and/or kV according to patient size and/or use of iterative reconstruction technique. CONTRAST:  75mL OMNIPAQUE IOHEXOL 350 MG/ML SOLN COMPARISON:  Head CT from earlier today FINDINGS: CTA NECK FINDINGS Aortic arch: 2 vessel branching. Right carotid system: Vessels are smoothly contoured and diffusely patent Left carotid system: Vessels are smoothly contoured and diffusely patent Vertebral arteries: No vertebral or subclavian stenosis or ulceration. Skeleton: Generalized cervical spine degeneration. Other neck: Negative Upper chest: Negative Review of the MIP images confirms the above findings CTA HEAD FINDINGS Anterior circulation: No spot sign, aneurysm, or signs of vascular malformation underlying the acute hemorrhage. Major vessels are widely patent and smoothly contoured. Posterior circulation: No significant stenosis, proximal occlusion, aneurysm, or vascular malformation. Venous sinuses:  Separate CT venogram. No dural venous sinus thrombosis or early enhancement. Anatomic variants: None significant Review of the MIP images confirms the above findings IMPRESSION: No vascular lesion or spot sign seen at the left frontal hemorrhage. Electronically Signed   By: Tiburcio Pea M.D.   On: 07/25/2022 05:08   CT Head Wo Contrast  Result Date: 07/25/2022 CLINICAL DATA:  Intracranial hemorrhage EXAM: CT HEAD WITHOUT CONTRAST TECHNIQUE: Contiguous axial images were obtained from the base of the skull through the vertex without intravenous contrast. RADIATION DOSE REDUCTION: This exam was performed according to the departmental dose-optimization program which includes automated exposure control, adjustment of the mA and/or kV according to patient size and/or use of iterative reconstruction technique. COMPARISON:  None Available. FINDINGS: Brain: Large acute intraparenchymal hematoma of the anterior superior left frontal lobe with overlying subarachnoid  blood. Volume is approximately 18 mL. The hematoma is located within the left ACA territory. No midline shift or other mass effect. Vascular: No abnormal hyperdensity of the major intracranial arteries or dural venous sinuses. No intracranial atherosclerosis. Skull: The visualized skull base, calvarium and extracranial soft tissues are normal. Sinuses/Orbits: No fluid levels or advanced mucosal thickening of the visualized paranasal sinuses. No mastoid or middle ear effusion. The orbits are normal. IMPRESSION: 1. Large acute intraparenchymal hematoma of the anterior superior left frontal lobe with overlying subarachnoid blood. Volume is approximately 18 mL. 2. No midline shift or other mass effect. Electronically Signed   By: Deatra Robinson M.D.   On: 07/25/2022 03:29   MR BRAIN WO CONTRAST  Result Date: 07/25/2022 CLINICAL DATA:  Altered mental status EXAM: MRI HEAD WITHOUT CONTRAST TECHNIQUE: Multiplanar, multiecho pulse sequences of the brain and  surrounding structures were obtained without intravenous contrast. COMPARISON:  None Available. FINDINGS: Brain: There is a large area of hemorrhage (approximately 7.0 x 2.0 x 2.5 cm) in the left frontal lobe, within the anterior cerebral artery territory. Mild surrounding edema. No visible acute ischemia, though susceptibility effects from the left frontal blood cause artifacts on the diffusion-weighted imaging. Normal white matter signal, parenchymal volume and CSF spaces. The midline structures are normal. Vascular: Major flow voids are preserved. Skull and upper cervical spine: Normal calvarium and skull base. Visualized upper cervical spine and soft tissues are normal. Sinuses/Orbits:No paranasal sinus fluid levels or advanced mucosal thickening. No mastoid or middle ear effusion. Normal orbits. IMPRESSION: Large area of hemorrhage (approximately 18 mL) in the left frontal lobe, within the anterior cerebral artery territory. The distribution raises suspicion for hemorrhagic conversion and ACA territory infarct. Head CT is recommended. Critical Value/emergent results were called by telephone at the time of interpretation on 07/25/2022 at 2:47 am to Dr. Preston Fleeting, who verbally acknowledged these results. Electronically Signed   By: Deatra Robinson M.D.   On: 07/25/2022 02:47     PHYSICAL EXAM  Temp:  [98.5 F (36.9 C)-99.3 F (37.4 C)] 99.2 F (37.3 C) (05/13 1107) Pulse Rate:  [62-120] 65 (05/13 1330) Resp:  [14-34] 24 (05/13 1330) BP: (96-160)/(61-96) 130/75 (05/13 1330) SpO2:  [94 %-100 %] 96 % (05/13 1330) Weight:  [87.2 kg] 87.2 kg (05/13 0545)  General: well-nourished, well-developed elderly female, laying in bed, NAD. CV: normal rate and regular rhythm. Pulm: normal WOB on RA. Skin: warm and dry.  Neurological: Remains very drowsy, but briefly opens eyes to loud voice or tactile stimulation. PERRL with left gaze preference. R facial droop present. She is nonverbal. She does not follow  commands. She does spontaneously move LUE antigravity. R hemiplegia. Withdraws RLE and LLE to noxious stimuli, but no response to noxious stimuli in RUE.    ASSESSMENT/PLAN Ms. Oria Mayernik Bouton is a 62 y.o. female with history of sickle cell trait, diverticulosis, asthma admitted for speech difficulty. No tPA given due to ICH.    ICH: Initial left frontal ICH with left ACA distribution, concerning for hemorrhagic transformation.  Then new left frontal ICH lateral to previous hematoma, etiology unclear MRI brain left frontal ICH, within the ACA territory. CT head left frontal ICH along left ACA territory with overlying SAH.  Volume is approximately 18 ml CT head and neck unremarkable CTV no venous thrombosis 2D Echo LVEF 55-60%, no RWMA, normal LA size, no interatrial shunt detected. EEG no seizure LDL 105 HgbA1c 6.0 UDS negative Subcutaneous heparin for VTE prophylaxis No antithrombotic prior to  admission, now on No antithrombotic due to ICH. Therapy recommendations: CIR Disposition: Pending  Cerebral edema CT repeat 5/11 8 AM progression of left frontal ICH with new hematoma lateral to the presenting hematoma in the frontal lobe, midline shift 5 mm CT repeat 5/11 unchanged mixed intra and extra-axial hematoma at left frontal lobe with persistent rightward midline shift and 5 mm Repeat CT 5/13 with unchanged L frontal hemorrhage and edema with mild subarachnoid extension, no new or progressive finding.  Will consider hypertonic saline if worsening edema.  BP management  Stable CT repeat stable, will relax BP to 130-150 Wean off Cleviprex if able Long term BP goal normotensive  Hyperlipidemia Home meds: None LDL 105, goal < 70 Will consider statin at discharge  Dysphagia Did not pass swallow N.p.o. for now Cortrak placed Now on tube feeding, DC IV fluid  Other Stroke Risk Factors Obesity, Body mass index is 31.03 kg/m.   Other Active Problems Mild leukocytosis, WBC  11.3-> 10.6-> 10.7 Sickle cell trait Diverticulosis  Hospital day # 2  Merrilyn Puma, MD [facility], PGY-3 07/27/2022, 2:07 PM  ATTENDING NOTE: I reviewed above note and agree with the assessment and plan. Pt was seen and examined.   Husband and son are at bedside.  Patient lying bed, still drowsy sleepy although briefly open eyes on voice and tactile stimulation.  However according to family patient was awake alert this morning, working with therapist, seem to have better mental status than yesterday.  CT repeat showed stable ICH and cerebral edema.  Relax BP to 1 30-1 50, wean off Cleviprex as able.  Did not pass swallow, but on core track, starting tube feeding and DC IV fluid.  PT and OT recommend CIR  For detailed assessment and plan, please refer to above/below as I have made changes wherever appropriate.   Marvel Plan, MD PhD Stroke Neurology 07/27/2022 5:44 PM  This patient is critically ill due to ICH, cerebral edema, altered mental status, dysphagia and at significant risk of neurological worsening, death form hematoma expansion, brain herniation, aspiration. This patient's care requires constant monitoring of vital signs, hemodynamics, respiratory and cardiac monitoring, review of multiple databases, neurological assessment, discussion with family, other specialists and medical decision making of high complexity. I spent 40 minutes of neurocritical care time in the care of this patient. I had long discussion with husband and son at bedside, updated pt current condition, treatment plan and potential prognosis, and answered all the questions.  They expressed understanding and appreciation.      To contact Stroke Continuity provider, please refer to [email or web address]. After hours, contact General Neurology

## 2022-07-27 NOTE — Evaluation (Signed)
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   OA (osteoarthritis) of knee    Right   Obesity    Plantar fasciitis    Postmenopausal    Pre-diabetes    Renal papillary necrosis (HCC)    Seasonal allergies    Sickle cell anemia (HCC)    sickle cell trait   Sickle cell trait (HCC)    Sigmoid diverticulosis 09/26/2015   Noted on colonoscopy   Past Surgical History:  Past Surgical History:  Procedure Laterality Date   ABDOMINAL HYSTERECTOMY  2004   APPENDECTOMY  2006   CESAREAN SECTION  1981, 1987   x2   COLONOSCOPY     COLONOSCOPY W/ POLYPECTOMY  09/26/2015   POLYPECTOMY     TOTAL KNEE ARTHROPLASTY Right 07/29/2018   Procedure: RIGHT TOTAL KNEE ARTHROPLASTY;  Surgeon: Tarry Kos, MD;  Location: [facility];  Service: Orthopedics;  Laterality: Right;   HPI:  Pt is a 62 y.o. female who presented with  difficulty speaking. MRI brain 5/11: Large area of hemorrhage (approximately 18 mL) in the left frontal lobe, within the anterior cerebral artery territory. Pt passed [facility] 5/11, but then had neuro changes and could not complete a repeat [facility] due to lethargy. PMH: sickle cell trait, sigmoid diverticulosis, asthma, OA; BSE completed on 07/26/22 with NPO recommendation. ST f/u for SLE and po readiness.   Assessment / Plan / Recommendation Clinical Impression  Pt seen for speech/language, cognitive assessment with pt nonverbal and unable to follow simple commands consistently (1-step, 25% with max cues) with lethargy also playing a role in performance.  Her son stated she "has been more alert" today, but this waxed/waned during session without mod-max multimodal cueing.  Pt unable to follow oral commands during oral mechanism examination, but did participate when offered spoon with various consistencies during swallowing re-assessment.  Pt with R facial weakness and R anterior spillage during examination.  Decreased focused/sustained attention/awareness observed as pt responded with eye gaze to L primarily during evaluation.  Pt unable to achieve voicing volitionally, but immediate cough was observed with tsp amounts of thin liquids.  Decreased processing of information seen comprehensively with pt responding with delay for all tasks.  ST will f/u for cognitive/linguistic needs during acute stay.  Recommend CIR. Thank you for this consult.    SLP Assessment  SLP Recommendation/Assessment: Patient needs continued  Speech Language Pathology Services SLP Visit Diagnosis: Aphasia (R47.01);Frontal lobe and executive function deficit;Cognitive communication deficit (R41.841) Frontal lobe and executive function deficit following: Other Nontraumatic ICH    Recommendations for follow up therapy are one component of a multi-disciplinary discharge planning process, led by the attending physician.  Recommendations may be  updated based on patient status, additional functional criteria and insurance authorization.    Follow Up Recommendations  Acute inpatient rehab (3hours/day)    Assistance Recommended at Discharge  Frequent or constant Supervision/Assistance  Functional Status Assessment Patient has had a recent decline in their functional status and demonstrates the ability to make significant improvements in function in a reasonable and predictable amount of time.  Frequency and Duration min 2x/week  1 week      SLP Evaluation Cognition  Overall Cognitive Status: Impaired/Different from baseline Arousal/Alertness: Lethargic Orientation Level: Disoriented X4 Attention: Focused Focused Attention: Impaired Focused Attention Impairment: Verbal basic;Functional basic Memory:  (n/a) Awareness: Impaired Awareness Impairment: Anticipatory impairment;Emergent impairment;Intellectual impairment Problem Solving: Impaired Problem Solving Impairment: Verbal basic;Functional basic Safety/Judgment: Other (comment) Comments: DTA, impaired       Comprehension  Auditory Comprehension Overall Auditory Comprehension: Impaired Yes/No Questions: Impaired Basic Biographical Questions: 0-25% accurate Commands: Impaired One Step Basic Commands: 25-49% accurate Conversation: Other (comment) (n/a; pt nonverbal) Interfering Components: Attention;Processing speed;Other (comment) (aphasia) Visual Recognition/Discrimination Discrimination: Not tested Reading Comprehension Reading Status: Not tested    Expression Expression Primary Mode of Expression: Other (comment) (no initiation during assessment; made eye contact briefly) Verbal Expression Overall Verbal Expression: Impaired Initiation: Impaired Level of Generative/Spontaneous Verbalization:  (not observed) Pragmatics: Unable to assess Non-Verbal Means of Communication: Eye gaze Written Expression Written Expression: Not tested   Oral / Motor  Oral  Motor/Sensory Function Overall Oral Motor/Sensory Function: Mild impairment Facial Symmetry: Abnormal symmetry right Facial Sensation: Reduced right Motor Speech Overall Motor Speech: Other (comment) (DTA) Respiration: Within functional limits Phonation: Other (comment) (not observed) Articulation:  (not observed) Intelligibility: Not tested Motor Planning: Not tested Motor Speech Errors: Not applicable            Pat Al Bracewell,M.S., CCC-SLP 07/27/2022, 11:14 AM

## 2022-07-28 DIAGNOSIS — I611 Nontraumatic intracerebral hemorrhage in hemisphere, cortical: Secondary | ICD-10-CM | POA: Diagnosis not present

## 2022-07-28 LAB — BASIC METABOLIC PANEL
Anion gap: 9 (ref 5–15)
BUN: 15 mg/dL (ref 8–23)
CO2: 23 mmol/L (ref 22–32)
Calcium: 8.2 mg/dL — ABNORMAL LOW (ref 8.9–10.3)
Chloride: 117 mmol/L — ABNORMAL HIGH (ref 98–111)
Creatinine, Ser: 0.85 mg/dL (ref 0.44–1.00)
GFR, Estimated: >60 mL/min (ref >60)
Glucose, Bld: 117 mg/dL — ABNORMAL HIGH (ref 70–99)
Potassium: 3.3 mmol/L — ABNORMAL LOW (ref 3.5–5.1)
Sodium: 149 mmol/L — ABNORMAL HIGH (ref 135–145)

## 2022-07-28 LAB — GLUCOSE, CAPILLARY
Glucose-Capillary: 122 mg/dL — ABNORMAL HIGH (ref 70–99)
Glucose-Capillary: 139 mg/dL — ABNORMAL HIGH (ref 70–99)
Glucose-Capillary: 129 mg/dL — ABNORMAL HIGH (ref 70–99)

## 2022-07-28 LAB — CBC
HCT: 35.6 % — ABNORMAL LOW (ref 36.0–46.0)
Hemoglobin: 11.7 g/dL — ABNORMAL LOW (ref 12.0–15.0)
MCH: 28.1 pg (ref 26.0–34.0)
MCHC: 32.9 g/dL (ref 30.0–36.0)
MCV: 85.6 fL (ref 80.0–100.0)
Platelets: 227 10*3/uL (ref 150–400)
RBC: 4.16 MIL/uL (ref 3.87–5.11)
RDW: 15.4 % (ref 11.5–15.5)
WBC: 9.4 10*3/uL (ref 4.0–10.5)
nRBC: 0 % (ref 0.0–0.2)

## 2022-07-28 LAB — MAGNESIUM
Magnesium: 2.2 mg/dL (ref 1.7–2.4)
Magnesium: 2.7 mg/dL — ABNORMAL HIGH (ref 1.7–2.4)

## 2022-07-28 LAB — PHOSPHORUS
Phosphorus: 2.6 mg/dL (ref 2.5–4.6)
Phosphorus: 2.6 mg/dL (ref 2.5–4.6)

## 2022-07-28 MED ORDER — POLYETHYLENE GLYCOL 3350 17 G PO PACK
17.0000 g | PACK | Freq: Two times a day (BID) | ORAL | Status: DC
  Administered 2022-07-28 – 2022-08-18 (×22): 17 g
  Filled 2022-07-28 (×28): qty 1

## 2022-07-28 MED ORDER — POTASSIUM CHLORIDE 20 MEQ PO PACK
40.0000 meq | PACK | ORAL | Status: AC
  Administered 2022-07-28 (×2): 40 meq
  Filled 2022-07-28 (×2): qty 2

## 2022-07-28 MED ORDER — ROSUVASTATIN CALCIUM 20 MG PO TABS
20.0000 mg | ORAL_TABLET | Freq: Every day | ORAL | Status: DC
  Administered 2022-07-29 – 2022-08-05 (×8): 20 mg
  Filled 2022-07-28 (×8): qty 1

## 2022-07-28 NOTE — Progress Notes (Signed)
Pt has order to transfer to [facility]. Report called to RN. All questions and concerns answered.  Called and spoke to pts husband, Ciji Bodle, and he was made aware of transfer.

## 2022-07-28 NOTE — Progress Notes (Signed)
Patient arrived to her room. Patient has 1 non-violent wrist restraint on left wrist. Restraint assessment performed. Patient is confused and non-verbal so I was unable to perform a complete neuro + NIH assignment.

## 2022-07-28 NOTE — Progress Notes (Signed)
Orthopedic Tech Progress Note Patient Details:  Anna Berry 04/01/1960 [id number]  Ortho Devices Type of Ortho Device: Prafo boot/shoe Ortho Device/Splint Location: BLE Ortho Device/Splint Interventions: Ordered, Application, Adjustment   Post Interventions Patient Tolerated: Well Instructions Provided: Care of device  Donald Pore 07/28/2022, 4:58 PM

## 2022-07-28 NOTE — Plan of Care (Signed)
  Problem: Education: Goal: Knowledge of disease or condition will improve Outcome: Progressing Goal: Knowledge of secondary prevention will improve (MUST DOCUMENT ALL) Outcome: Progressing Goal: Knowledge of patient specific risk factors will improve Loraine Leriche N/A or DELETE if not current risk factor) Outcome: Progressing   Problem: Intracerebral Hemorrhage Tissue Perfusion: Goal: Complications of Intracerebral Hemorrhage will be minimized Outcome: Progressing   Problem: Coping: Goal: Will verbalize positive feelings about self Outcome: Progressing Goal: Will identify appropriate support needs Outcome: Progressing   Problem: Health Behavior/Discharge Planning: Goal: Ability to manage health-related needs will improve Outcome: Progressing Goal: Goals will be collaboratively established with patient/family Outcome: Progressing   Problem: Self-Care: Goal: Ability to participate in self-care as condition permits will improve Outcome: Progressing Goal: Verbalization of feelings and concerns over difficulty with self-care will improve Outcome: Progressing Goal: Ability to communicate needs accurately will improve Outcome: Progressing   Problem: Nutrition: Goal: Risk of aspiration will decrease Outcome: Progressing Goal: Dietary intake will improve Outcome: Progressing   Problem: Education: Goal: Knowledge of General Education information will improve Description: Including pain rating scale, medication(s)/side effects and non-pharmacologic comfort measures Outcome: Progressing   Problem: Health Behavior/Discharge Planning: Goal: Ability to manage health-related needs will improve Outcome: Progressing

## 2022-07-28 NOTE — Progress Notes (Signed)
STROKE TEAM PROGRESS NOTE   SUBJECTIVE (INTERVAL HISTORY) Husband and RN are at the bedside. Pt sitting in chair, eyes open, awake but global aphasia, and right hemiplegia.   OBJECTIVE Temp:  [98.4 F (36.9 C)-99.4 F (37.4 C)] 98.4 F (36.9 C) (05/14 1130) Pulse Rate:  [62-99] 82 (05/14 1400) Cardiac Rhythm: Normal sinus rhythm (05/14 0800) Resp:  [13-21] 17 (05/14 1400) BP: (104-159)/(62-106) 136/82 (05/14 1400) SpO2:  [92 %-99 %] 95 % (05/14 1400) Weight:  [85.5 kg] 85.5 kg (05/14 0300)  Recent Labs  Lab 07/25/22 1912 07/25/22 2253 07/26/22 0255 07/26/22 1921 07/28/22 0335  GLUCAP 147* 154* 118* 113* 122*   Recent Labs  Lab 07/24/22 2035 07/24/22 2040 07/26/22 0255 07/27/22 0415 07/27/22 1752 07/28/22 0305  NA 140 140 143 141  --  149*  K 4.3 4.2 3.3* 3.5  --  3.3*  CL 105 108 107 112*  --  117*  CO2 24  --  26 23  --  23  GLUCOSE 107* 109* 133* 119*  --  117*  BUN 10 14 9 9   --  15  CREATININE 1.12* 1.10* 0.94 0.91  --  0.85  CALCIUM 9.5  --  9.3 9.1  --  8.2*  MG  --   --   --   --  2.4 2.2  PHOS  --   --   --   --  3.7 2.6   Recent Labs  Lab 07/24/22 2035  AST 27  ALT 19  ALKPHOS 79  BILITOT 0.5  PROT 7.2  ALBUMIN 4.2   Recent Labs  Lab 07/24/22 2035 07/24/22 2040 07/26/22 0255 07/27/22 0415 07/28/22 0305  WBC 11.3*  --  10.6* 10.7* 9.4  NEUTROABS 8.6*  --   --   --   --   HGB 13.3 13.6 12.5 13.7 11.7*  HCT 42.5 40.0 37.4 41.7 35.6*  MCV 90.0  --  84.2 84.8 85.6  PLT 268  --  256 241 227   No results for input(s): "CKTOTAL", "CKMB", "CKMBINDEX", "TROPONINI" in the last 168 hours. No results for input(s): "LABPROT", "INR" in the last 72 hours.  No results for input(s): "COLORURINE", "LABSPEC", "PHURINE", "GLUCOSEU", "HGBUR", "BILIRUBINUR", "KETONESUR", "PROTEINUR", "UROBILINOGEN", "NITRITE", "LEUKOCYTESUR" in the last 72 hours.  Invalid input(s): "APPERANCEUR"     Component Value Date/Time   CHOL 164 07/25/2022 0623   CHOL 155  09/12/2021 1509   TRIG 52 07/25/2022 0623   HDL 49 07/25/2022 0623   HDL 46 09/12/2021 1509   CHOLHDL 3.3 07/25/2022 0623   VLDL 10 07/25/2022 0623   LDLCALC 105 (H) 07/25/2022 0623   LDLCALC 85 09/12/2021 1509   Lab Results  Component Value Date   HGBA1C 6.0 (H) 07/25/2022      Component Value Date/Time   LABOPIA NONE DETECTED 07/26/2022 1850   COCAINSCRNUR NONE DETECTED 07/26/2022 1850   LABBENZ NONE DETECTED 07/26/2022 1850   AMPHETMU NONE DETECTED 07/26/2022 1850   THCU NONE DETECTED 07/26/2022 1850   LABBARB NONE DETECTED 07/26/2022 1850    Recent Labs  Lab 07/24/22 2035  ETH <10    I have personally reviewed the radiological images below and agree with the radiology interpretations.  DG Abd Portable 1V  Result Date: 07/27/2022 CLINICAL DATA:  Encounter for feeding tube placement. EXAM: PORTABLE ABDOMEN - 1 VIEW COMPARISON:  None. FINDINGS: Feeding tube tip projects over the distal stomach. The bowel gas pattern is normal. No radio-opaque calculi or other significant radiographic abnormality are  seen. IMPRESSION: Feeding tube tip projects over the distal stomach. Electronically Signed   By: Orvan Falconer M.D.   On: 07/27/2022 14:56   ECHOCARDIOGRAM COMPLETE  Result Date: 07/27/2022    ECHOCARDIOGRAM REPORT   Patient Name:   Anna Berry Date of Exam: 07/27/2022 Medical Rec #:  [id number]        Height:       66.0 in Accession #:    [id number]       Weight:       192.2 lb Date of Birth:  [birth year]       BSA:          1.966 m Patient Age:    61 years         BP:           133/74 mmHg Patient Gender: F                HR:           79 bpm. Exam Location:  Inpatient Procedure: 2D Echo, Cardiac Doppler and Color Doppler Indications:    Stroke  History:        Patient has no prior history of Echocardiogram examinations.                 Sickel Cell Trait.  Sonographer:    Raeford Razor Referring Phys: [id number] [facility] KHALIQDINA IMPRESSIONS  1. Left ventricular ejection fraction, by  estimation, is 55 to 60%. The left ventricle has normal function. The left ventricle has no regional wall motion abnormalities. Left ventricular diastolic parameters are consistent with Grade I diastolic dysfunction (impaired relaxation).  2. Right ventricular systolic function is normal. The right ventricular size is normal.  3. The mitral valve is normal in structure. No evidence of mitral valve regurgitation. No evidence of mitral stenosis.  4. The aortic valve is tricuspid. Aortic valve regurgitation is not visualized. No aortic stenosis is present.  5. The inferior vena cava is normal in size with greater than 50% respiratory variability, suggesting right atrial pressure of 3 mmHg. Comparison(s): No prior Echocardiogram. FINDINGS  Left Ventricle: Left ventricular ejection fraction, by estimation, is 55 to 60%. The left ventricle has normal function. The left ventricle has no regional wall motion abnormalities. The left ventricular internal cavity size was normal in size. There is  no left ventricular hypertrophy. Left ventricular diastolic parameters are consistent with Grade I diastolic dysfunction (impaired relaxation). Right Ventricle: The right ventricular size is normal. Right ventricular systolic function is normal. Left Atrium: Left atrial size was normal in size. Right Atrium: Right atrial size was normal in size. Pericardium: There is no evidence of pericardial effusion. Mitral Valve: The mitral valve is normal in structure. No evidence of mitral valve regurgitation. No evidence of mitral valve stenosis. Tricuspid Valve: The tricuspid valve is normal in structure. Tricuspid valve regurgitation is trivial. No evidence of tricuspid stenosis. Aortic Valve: The aortic valve is tricuspid. Aortic valve regurgitation is not visualized. No aortic stenosis is present. Aortic valve mean gradient measures 5.0 mmHg. Aortic valve peak gradient measures 9.7 mmHg. Aortic valve area, by VTI measures 2.48 cm. Pulmonic  Valve: The pulmonic valve was not well visualized. Pulmonic valve regurgitation is not visualized. No evidence of pulmonic stenosis. Aorta: The aortic root is normal in size and structure. Venous: The inferior vena cava is normal in size with greater than 50% respiratory variability, suggesting right atrial pressure of 3 mmHg. IAS/Shunts: No atrial level shunt detected by color  flow Doppler.  LEFT VENTRICLE PLAX 2D LVIDd:         3.90 cm   Diastology LVIDs:         3.00 cm   LV e' medial:    6.96 cm/s LV PW:         0.90 cm   LV E/e' medial:  12.8 LV IVS:        0.60 cm   LV e' lateral:   11.30 cm/s LVOT diam:     2.10 cm   LV E/e' lateral: 7.9 LV SV:         62 LV SV Index:   31 LVOT Area:     3.46 cm  RIGHT VENTRICLE             IVC RV Basal diam:  2.00 cm     IVC diam: 1.00 cm RV S prime:     20.10 cm/s TAPSE (M-mode): 2.5 cm LEFT ATRIUM             Index        RIGHT ATRIUM           Index LA diam:        3.20 cm 1.63 cm/m   RA Area:     11.40 cm LA Vol (A2C):   27.1 ml 13.78 ml/m  RA Volume:   24.10 ml  12.26 ml/m LA Vol (A4C):   40.0 ml 20.34 ml/m LA Biplane Vol: 36.8 ml 18.71 ml/m  AORTIC VALVE AV Area (Vmax):    2.53 cm AV Area (Vmean):   2.38 cm AV Area (VTI):     2.48 cm AV Vmax:           156.00 cm/s AV Vmean:          108.000 cm/s AV VTI:            0.249 m AV Peak Grad:      9.7 mmHg AV Mean Grad:      5.0 mmHg LVOT Vmax:         114.00 cm/s LVOT Vmean:        74.100 cm/s LVOT VTI:          0.178 m LVOT/AV VTI ratio: 0.71  AORTA Ao Root diam: 3.20 cm Ao Asc diam:  3.20 cm MITRAL VALVE                TRICUSPID VALVE MV Area (PHT): 5.79 cm     TR Peak grad:   23.6 mmHg MV E velocity: 88.80 cm/s   TR Vmax:        243.00 cm/s MV A velocity: 130.00 cm/s MV E/A ratio:  0.68         SHUNTS                             Systemic VTI:  0.18 m                             Systemic Diam: 2.10 cm Olga Millers MD Electronically signed by Olga Millers MD Signature Date/Time: 07/27/2022/12:38:46 PM    Final     CT HEAD WO CONTRAST ( )  Result Date: 07/27/2022 CLINICAL DATA:  Hemorrhagic stroke follow-up. EXAM: CT HEAD WITHOUT CONTRAST TECHNIQUE: Contiguous axial images were obtained from the base of the skull through the vertex without intravenous contrast. RADIATION DOSE REDUCTION: This exam was  performed according to the departmental dose-optimization program which includes automated exposure control, adjustment of the mA and/or kV according to patient size and/or use of iterative reconstruction technique. COMPARISON:  Two days ago FINDINGS: Brain: Regional hemorrhage in edema facet patchy hemorrhage in regional edema in the left frontal lobe is unchanged with small volume subarachnoid hemorrhage descending the inter hemispheric fissure. Close proximity to the left lateral ventricle but no intraventricular extension. No hydrocephalus. Midline shift still measures 5 mm at the level of the septum pellucidum. Vascular: No hyperdense vessel or unexpected calcification. Skull: Normal. Negative for fracture or focal lesion. Sinuses/Orbits: No acute finding. IMPRESSION: Unchanged left frontal hemorrhage and edema with mild subarachnoid extension. No new or progressive finding. Midline shift measures 5 mm. Electronically Signed   By: Tiburcio Pea M.D.   On: 07/27/2022 05:43   EEG adult  Result Date: 07/25/2022 Jefferson Fuel, MD     07/25/2022  8:18 PM Routine EEG Report Aaditri Chavez Sabo is a 62 y.o. female with a history of intracranial hemorrhage and altered mental status who is undergoing an EEG to evaluate for seizures. Report: This EEG was acquired with electrodes placed according to the International 10-20 electrode system (including Fp1, Fp2, F3, F4, C3, C4, P3, P4, O1, O2, T3, T4, T5, T6, A1, A2, Fz, Cz, Pz). The following electrodes were missing or displaced: none. The occipital dominant rhythm was 7 Hz. This activity is reactive to stimulation. Drowsiness was manifested by background fragmentation;  deeper stages of sleep were not identified. There was focal slowing over the left frontal region. There were no interictal epileptiform discharges. There were no electrographic seizures identified. Photic stimulation and hyperventilation were not performed. Impression and clinical correlation: This EEG was obtained while awake and drowsy and is abnormal due to mild diffuse slowing indicative of global cerebral dysfunction and left frontal slowing over the area of intracranial hemorrhage. Epileptiform abnormalities were not seen during this recording. Bing Neighbors, MD [facility] [phone] If 7pm- 7am, please page neurology on call as listed in AMION.   CT HEAD WO CONTRAST ( )  Result Date: 07/25/2022 CLINICAL DATA:  Intracranial hemorrhage follow up EXAM: CT HEAD WITHOUT CONTRAST TECHNIQUE: Contiguous axial images were obtained from the base of the skull through the vertex without intravenous contrast. RADIATION DOSE REDUCTION: This exam was performed according to the departmental dose-optimization program which includes automated exposure control, adjustment of the mA and/or kV according to patient size and/or use of iterative reconstruction technique. COMPARISON:  07/25/2022 at 9:04 a.m. FINDINGS: Brain: Compared to the most recent CT, the large mixed intra-axial and extra-axial hematoma at the left frontal lobe is unchanged. There is persistent rightward midline shift that measures 5 mm. Vascular: No hyperdense vessel or unexpected calcification. Skull: Normal. Negative for fracture or focal lesion. Sinuses/Orbits: No acute finding. Other: None. IMPRESSION: Unchanged large mixed intra- and extra-axial hematoma at the left frontal lobe with persistent rightward midline shift that measures 5 mm. Electronically Signed   By: Deatra Robinson M.D.   On: 07/25/2022 20:10   [place] EKG SITE RITE  Result Date: 07/25/2022 If Site Rite image not attached, placement could not be confirmed due to current  cardiac rhythm.  CT HEAD WO CONTRAST  Result Date: 07/25/2022 CLINICAL DATA:  Stroke follow-up EXAM: CT HEAD WITHOUT CONTRAST TECHNIQUE: Contiguous axial images were obtained from the base of the skull through the vertex without intravenous contrast. RADIATION DOSE REDUCTION: This exam was performed according to the departmental dose-optimization program which includes  automated exposure control, adjustment of the mA and/or kV according to patient size and/or use of iterative reconstruction technique. COMPARISON:  CT and CTA from earlier today FINDINGS: Brain: Superior left frontal/parasagittal hematoma with progression in the adjacent frontal white matter. Hemorrhage is seen in both the parenchymal and subarachnoid compartments with mild brain edema. The area of newly affected brain had a normal appearance on the prior brain MRI. The CTV adn MRI are again reviewed and the superior sagittal sinus is patent and there is no detectable cortical vein thrombus. The new area of hemorrhage measures 3.4 x 1.8 x 1.7 cm. Rightward midline shift measures 5 mm. No hydrocephalus. Vascular: No hyperdense vessel or unexpected calcification. Skull: Normal. Negative for fracture or focal lesion. Sinuses/Orbits: No acute finding. Dr. Roda Shutters is aware of the findings at time of signing. IMPRESSION: Progression of left frontal ICH with new hematoma lateral to the presenting hematoma in the frontal lobe. Midline shift now measures 5 mm. Electronically Signed   By: Tiburcio Pea M.D.   On: 07/25/2022 09:37   CT VENOGRAM HEAD  Result Date: 07/25/2022 CLINICAL DATA:  ICH EXAM: CT VENOGRAM HEAD TECHNIQUE: Venographic phase images of the brain were obtained following the administration of intravenous contrast. Multiplanar reformats and maximum intensity projections were generated. RADIATION DOSE REDUCTION: This exam was performed according to the departmental dose-optimization program which includes automated exposure control,  adjustment of the mA and/or kV according to patient size and/or use of iterative reconstruction technique. CONTRAST:  75mL OMNIPAQUE IOHEXOL 350 MG/ML SOLN COMPARISON:  Head CT and brain MRI from earlier today FINDINGS: No dural venous sinus thrombosis. The superior sagittal sinus is diffusely patent, including adjacent to the known left frontal hemorrhage. Deep and cortical veins also show no signs of thrombosis. The left transverse sigmoid outflow is dominant without stricture or other filling defect seen. No masslike enhancement at the hematoma. Follow-up after hemorrhage resolution may be useful in detecting compressed vascular lesion. IMPRESSION: Negative for dural venous sinus thrombosis. No vascular lesion seen underlying the hemorrhage. Electronically Signed   By: Tiburcio Pea M.D.   On: 07/25/2022 05:10   CT ANGIO HEAD NECK W WO CM  Result Date: 07/25/2022 CLINICAL DATA:  Hemorrhagic stroke. EXAM: CT ANGIOGRAPHY HEAD AND NECK WITH AND WITHOUT CONTRAST TECHNIQUE: Multidetector CT imaging of the head and neck was performed using the standard protocol during bolus administration of intravenous contrast. Multiplanar CT image reconstructions and MIPs were obtained to evaluate the vascular anatomy. Carotid stenosis measurements (when applicable) are obtained utilizing NASCET criteria, using the distal internal carotid diameter as the denominator. RADIATION DOSE REDUCTION: This exam was performed according to the departmental dose-optimization program which includes automated exposure control, adjustment of the mA and/or kV according to patient size and/or use of iterative reconstruction technique. CONTRAST:  75mL OMNIPAQUE IOHEXOL 350 MG/ML SOLN COMPARISON:  Head CT from earlier today FINDINGS: CTA NECK FINDINGS Aortic arch: 2 vessel branching. Right carotid system: Vessels are smoothly contoured and diffusely patent Left carotid system: Vessels are smoothly contoured and diffusely patent Vertebral  arteries: No vertebral or subclavian stenosis or ulceration. Skeleton: Generalized cervical spine degeneration. Other neck: Negative Upper chest: Negative Review of the MIP images confirms the above findings CTA HEAD FINDINGS Anterior circulation: No spot sign, aneurysm, or signs of vascular malformation underlying the acute hemorrhage. Major vessels are widely patent and smoothly contoured. Posterior circulation: No significant stenosis, proximal occlusion, aneurysm, or vascular malformation. Venous sinuses: Separate CT venogram. No dural venous sinus thrombosis  or early enhancement. Anatomic variants: None significant Review of the MIP images confirms the above findings IMPRESSION: No vascular lesion or spot sign seen at the left frontal hemorrhage. Electronically Signed   By: Tiburcio Pea M.D.   On: 07/25/2022 05:08   CT Head Wo Contrast  Result Date: 07/25/2022 CLINICAL DATA:  Intracranial hemorrhage EXAM: CT HEAD WITHOUT CONTRAST TECHNIQUE: Contiguous axial images were obtained from the base of the skull through the vertex without intravenous contrast. RADIATION DOSE REDUCTION: This exam was performed according to the departmental dose-optimization program which includes automated exposure control, adjustment of the mA and/or kV according to patient size and/or use of iterative reconstruction technique. COMPARISON:  None Available. FINDINGS: Brain: Large acute intraparenchymal hematoma of the anterior superior left frontal lobe with overlying subarachnoid blood. Volume is approximately 18 mL. The hematoma is located within the left ACA territory. No midline shift or other mass effect. Vascular: No abnormal hyperdensity of the major intracranial arteries or dural venous sinuses. No intracranial atherosclerosis. Skull: The visualized skull base, calvarium and extracranial soft tissues are normal. Sinuses/Orbits: No fluid levels or advanced mucosal thickening of the visualized paranasal sinuses. No mastoid  or middle ear effusion. The orbits are normal. IMPRESSION: 1. Large acute intraparenchymal hematoma of the anterior superior left frontal lobe with overlying subarachnoid blood. Volume is approximately 18 mL. 2. No midline shift or other mass effect. Electronically Signed   By: Deatra Robinson M.D.   On: 07/25/2022 03:29   MR BRAIN WO CONTRAST  Result Date: 07/25/2022 CLINICAL DATA:  Altered mental status EXAM: MRI HEAD WITHOUT CONTRAST TECHNIQUE: Multiplanar, multiecho pulse sequences of the brain and surrounding structures were obtained without intravenous contrast. COMPARISON:  None Available. FINDINGS: Brain: There is a large area of hemorrhage (approximately 7.0 x 2.0 x 2.5 cm) in the left frontal lobe, within the anterior cerebral artery territory. Mild surrounding edema. No visible acute ischemia, though susceptibility effects from the left frontal blood cause artifacts on the diffusion-weighted imaging. Normal white matter signal, parenchymal volume and CSF spaces. The midline structures are normal. Vascular: Major flow voids are preserved. Skull and upper cervical spine: Normal calvarium and skull base. Visualized upper cervical spine and soft tissues are normal. Sinuses/Orbits:No paranasal sinus fluid levels or advanced mucosal thickening. No mastoid or middle ear effusion. Normal orbits. IMPRESSION: Large area of hemorrhage (approximately 18 mL) in the left frontal lobe, within the anterior cerebral artery territory. The distribution raises suspicion for hemorrhagic conversion and ACA territory infarct. Head CT is recommended. Critical Value/emergent results were called by telephone at the time of interpretation on 07/25/2022 at 2:47 am to Dr. Preston Fleeting, who verbally acknowledged these results. Electronically Signed   By: Deatra Robinson M.D.   On: 07/25/2022 02:47     PHYSICAL EXAM  Temp:  [98.4 F (36.9 C)-99.4 F (37.4 C)] 98.4 F (36.9 C) (05/14 1130) Pulse Rate:  [62-99] 82 (05/14 1400) Resp:   [13-21] 17 (05/14 1400) BP: (104-159)/(62-106) 136/82 (05/14 1400) SpO2:  [92 %-99 %] 95 % (05/14 1400) Weight:  [85.5 kg] 85.5 kg (05/14 0300)  General: well-nourished, well-developed elderly female, sitting in chair, NAD. CV: normal rate and regular rhythm. Pulm: normal WOB on RA. Skin: warm and dry.  Neurological: eyes open, awake, but global aphasia, nonverbal and not following commands. PERRL with left gaze preference, not cross midline. R facial droop present. She does spontaneously move LUE and LLE antigravity. R hemiplegia. Sensation, coordination and gait not tested.    ASSESSMENT/PLAN Anna Berry is a 62 y.o. female with history of sickle cell trait, diverticulosis, asthma admitted for speech difficulty. No tPA given due to ICH.    ICH: Initial left frontal ICH with left ACA distribution, concerning for hemorrhagic transformation.  Then new left frontal ICH lateral to previous hematoma, etiology unclear MRI brain left frontal ICH, within the ACA territory. CT head left frontal ICH along left ACA territory with overlying SAH.  Volume is approximately 18 ml CT head and neck unremarkable CTV no venous thrombosis 2D Echo LVEF 55-60%, no RWMA, normal LA size, no interatrial shunt detected. EEG no seizure LDL 105 HgbA1c 6.0 UDS negative Subcutaneous heparin for VTE prophylaxis No antithrombotic prior to admission, now on No antithrombotic due to ICH. Therapy recommendations: CIR Disposition: Pending  Cerebral edema CT repeat 5/11 8 AM progression of left frontal ICH with new hematoma lateral to the presenting hematoma in the frontal lobe, midline shift 5 mm CT repeat 5/11 unchanged mixed intra and extra-axial hematoma at left frontal lobe with persistent rightward midline shift and 5 mm Repeat CT 5/13 with unchanged L frontal hemorrhage and edema with mild subarachnoid extension, no new or progressive finding.  Na 143->141->149 On TF, monitor BMP  BP management   Stable CT repeat stable 5/13, will relax BP to 130-150 off Cleviprex  Long term BP goal normotensive  Hyperlipidemia Home meds: None LDL 105, goal < 70 Crestor 20 added Continue statin at discharge  Dysphagia Did not pass swallow N.p.o. for now Cortrak placed Now on tube feeding   Other Stroke Risk Factors Obesity, Body mass index is 30.42 kg/m.   Other Active Problems Mild leukocytosis, WBC 11.3-> 10.6-> 10.7->9.4 Sickle cell trait Diverticulosis  Hospital day # 3   Marvel Plan, MD PhD Stroke Neurology 07/28/2022 3:17 PM  This patient is critically ill due to ICH, cerebral edema, altered mental status, dysphagia and at significant risk of neurological worsening, death form hematoma expansion, brain herniation, aspiration. This patient's care requires constant monitoring of vital signs, hemodynamics, respiratory and cardiac monitoring, review of multiple databases, neurological assessment, discussion with family, other specialists and medical decision making of high complexity. I spent 40 minutes of neurocritical care time in the care of this patient. I had long discussion with husband at bedside, updated pt current condition, treatment plan and potential prognosis, and answered all the questions. He expressed understanding and appreciation.      To contact Stroke Continuity provider, please refer to [email or web address]. After hours, contact General Neurology

## 2022-07-28 NOTE — Progress Notes (Signed)
Physical Therapy Treatment Patient Details Name: Anna Berry MRN: [id number] DOB: [birth year] Today's Date: 07/28/2022   History of Present Illness Pt is a 62 y.o. female who presented 07/25/22 with speech difficulties. MRI showed a L frontal ICH. Worsening neuro decline noted 5/11 after admitting pt. Repeat imaging showed new area of ICH lateral to current ICH with MLS 5 mm. PMH: Sickle cell trait, sigmoid diverticulosis, asthma, OA, obesity, pre-diabetes    PT Comments    Pt did open her eyes more as she mobilized during the session and kept her eyes open when her husband arrived at end of session. She did try to mumble something 1x, but displays difficulty with following commands (follows <5%). Pt was able to pull on therapist when her L hand was guided where to pull in order to assist with bed mobility and transfers though. She was able to progress to transferring OOB to the recliner via a stand pivot transfer with maxA today. Will continue to follow acutely.    Recommendations for follow up therapy are one component of a multi-disciplinary discharge planning process, led by the attending physician.  Recommendations may be updated based on patient status, additional functional criteria and insurance authorization.  Follow Up Recommendations       Assistance Recommended at Discharge Frequent or constant Supervision/Assistance  Patient can return home with the following Two people to help with walking and/or transfers;A lot of help with bathing/dressing/bathroom;Assistance with cooking/housework;Assistance with feeding;Direct supervision/assist for medications management;Direct supervision/assist for financial management;Assist for transportation;Help with stairs or ramp for entrance   Equipment Recommendations  Other (comment) (TBA)    Recommendations for Other Services       Precautions / Restrictions Precautions Precautions: Fall;Other (comment) Precaution Comments: L mitten;  cortak; SBP 130-150 goal Restrictions Weight Bearing Restrictions: No     Mobility  Bed Mobility Overal bed mobility: Needs Assistance Bed Mobility: Supine to Sit     Supine to sit: Max assist, HOB elevated     General bed mobility comments: Pt dependent on therapist to direct legs off L EOB but pt did grab therapist's arm with her L UE and did pull on it, assisting with ascending her trunk to sit L EOB, maxA.    Transfers Overall transfer level: Needs assistance Equipment used: 1 Neece hand held assist Transfers: Sit to/from Stand, Bed to chair/wheelchair/BSC Sit to Stand: Max assist Stand pivot transfers: Max assist         General transfer comment: Directed pt's L arm to [name] trunk for pt to pull on to stand up from EOB, R knee block provided, maxA to stand and then pivot to L to recliner. Pt assisted in pivoting on her L foot.    Ambulation/Gait               General Gait Details: unable   Stairs             Wheelchair Mobility    Modified Rankin (Stroke Patients Only) Modified Rankin (Stroke Patients Only) Pre-Morbid Rankin Score: No symptoms Modified Rankin: Severe disability     Balance Overall balance assessment: Needs assistance Sitting-balance support: Single extremity supported, No upper extremity supported, Feet supported Sitting balance-Leahy Scale: Poor Sitting balance - Comments: Posterior lean and pushing self with L UE to her R, needing modA majority of time for static sitting balance, but did note pt would activate her trunk to prevent full LOB posteriorly intermittently. Propped pt on L elbow 1x sitting EOB, slightly improved midline alignment  after being propped on L elbow. Postural control: Posterior lean, Right lateral lean Standing balance support: Single extremity supported Standing balance-Leahy Scale: Poor Standing balance comment: MaxA to stand with L UE support and R knee block                             Cognition Arousal/Alertness: Lethargic, Awake/alert Behavior During Therapy: Flat affect Overall Cognitive Status: Difficult to assess                                 General Comments: Pt lethargic initially, but she did open her eyes once she was mobilized more in bed. She was able to track to the L and slightly and briefly to the R but tends to prefer a forward gaze. Following <5% of multi-modal cues        Exercises Other Exercises Other Exercises: Prolonged PROM to R ankle into dorsiflexion with knee flexed while sitting in chair    General Comments General comments (skin integrity, edema, etc.): BP 135/79 (93) supine, 156/103 (116) sitting after transfer to chair, 127/76 (91) several minutes resting in chair; noted increased R extremities tone today, particularly in her UE      Pertinent Vitals/Pain Pain Assessment Pain Assessment: Faces Faces Pain Scale: Hurts a little bit Pain Location: grimacing with noxious stimuli/movement of cortrak Pain Descriptors / Indicators: Discomfort, Grimacing Pain Intervention(s): Limited activity within patient's tolerance, Monitored during session, Repositioned    Home Living                          Prior Function            PT Goals (current goals can now be found in the care plan section) Acute Rehab PT Goals Patient Stated Goal: did not state; family hopes for pt to improve PT Goal Formulation: With family Time For Goal Achievement: 08/09/22 Potential to Achieve Goals: Good Progress towards PT goals: Progressing toward goals    Frequency    Min 4X/week      PT Plan Current plan remains appropriate    Co-evaluation              AM-PAC PT "6 Clicks" Mobility   Outcome Measure  Help needed turning from your back to your side while in a flat bed without using bedrails?: A Lot Help needed moving from lying on your back to sitting on the side of a flat bed without using bedrails?: A Lot Help  needed moving to and from a bed to a chair (including a wheelchair)?: A Lot Help needed standing up from a chair using your arms (e.g., wheelchair or bedside chair)?: A Lot Help needed to walk in hospital room?: Total Help needed climbing 3-5 steps with a railing? : Total 6 Click Score: 10    End of Session Equipment Utilized During Treatment: Gait belt Activity Tolerance: Patient limited by lethargy;Patient tolerated treatment well Patient left: with call bell/phone within reach;with family/visitor present;in chair;with chair alarm set;with restraints reapplied Nurse Communication: Mobility status;Other (comment);Need for lift equipment (BP) PT Visit Diagnosis: Unsteadiness on feet (R26.81);Muscle weakness (generalized) (M62.81);Difficulty in walking, not elsewhere classified (R26.2);Other symptoms and signs involving the nervous system (R29.898);Hemiplegia and hemiparesis Hemiplegia - Right/Left: Right Hemiplegia - dominant/non-dominant: Dominant Hemiplegia - caused by: Other Nontraumatic intracranial hemorrhage     Time: 4098-1191 PT Time Calculation (  min) (ACUTE ONLY): 37 min  Charges:  $Therapeutic Activity: 23-37 mins                     Raymond Gurney, PT, DPT Acute Rehabilitation Services  Office: [phone]    Jewel Baize 07/28/2022, 5:20 PM

## 2022-07-29 ENCOUNTER — Inpatient Hospital Stay (HOSPITAL_COMMUNITY): Payer: BLUE CROSS/BLUE SHIELD

## 2022-07-29 DIAGNOSIS — I611 Nontraumatic intracerebral hemorrhage in hemisphere, cortical: Secondary | ICD-10-CM

## 2022-07-29 DIAGNOSIS — E669 Obesity, unspecified: Secondary | ICD-10-CM

## 2022-07-29 DIAGNOSIS — R509 Fever, unspecified: Secondary | ICD-10-CM

## 2022-07-29 LAB — LACTIC ACID, PLASMA
Lactic Acid, Venous: 2.3 mmol/L (ref 0.5–1.9)
Lactic Acid, Venous: 3.2 mmol/L (ref 0.5–1.9)

## 2022-07-29 LAB — GLUCOSE, CAPILLARY
Glucose-Capillary: 145 mg/dL — ABNORMAL HIGH (ref 70–99)
Glucose-Capillary: 148 mg/dL — ABNORMAL HIGH (ref 70–99)
Glucose-Capillary: 148 mg/dL — ABNORMAL HIGH (ref 70–99)
Glucose-Capillary: 167 mg/dL — ABNORMAL HIGH (ref 70–99)
Glucose-Capillary: 170 mg/dL — ABNORMAL HIGH (ref 70–99)
Glucose-Capillary: 180 mg/dL — ABNORMAL HIGH (ref 70–99)
Glucose-Capillary: 204 mg/dL — ABNORMAL HIGH (ref 70–99)
Glucose-Capillary: 240 mg/dL — ABNORMAL HIGH (ref 70–99)
Glucose-Capillary: 244 mg/dL — ABNORMAL HIGH (ref 70–99)

## 2022-07-29 LAB — HEPATIC FUNCTION PANEL
ALT: 24 U/L (ref 0–44)
AST: 35 U/L (ref 15–41)
Albumin: 4.1 g/dL (ref 3.5–5.0)
Alkaline Phosphatase: 84 U/L (ref 38–126)
Bilirubin, Direct: 0.3 mg/dL — ABNORMAL HIGH (ref 0.0–0.2)
Indirect Bilirubin: 0.7 mg/dL (ref 0.3–0.9)
Total Bilirubin: 1 mg/dL (ref 0.3–1.2)
Total Protein: 7.8 g/dL (ref 6.5–8.1)

## 2022-07-29 LAB — CBC
HCT: 43.9 % (ref 36.0–46.0)
Hemoglobin: 14.1 g/dL (ref 12.0–15.0)
MCH: 27.4 pg (ref 26.0–34.0)
MCHC: 32.1 g/dL (ref 30.0–36.0)
MCV: 85.4 fL (ref 80.0–100.0)
Platelets: 260 10*3/uL (ref 150–400)
RBC: 5.14 MIL/uL — ABNORMAL HIGH (ref 3.87–5.11)
RDW: 15.7 % — ABNORMAL HIGH (ref 11.5–15.5)
WBC: 11.7 10*3/uL — ABNORMAL HIGH (ref 4.0–10.5)
nRBC: 0 % (ref 0.0–0.2)

## 2022-07-29 LAB — URINALYSIS, ROUTINE W REFLEX MICROSCOPIC
Bilirubin Urine: NEGATIVE
Glucose, UA: NEGATIVE mg/dL
Ketones, ur: NEGATIVE mg/dL
Nitrite: NEGATIVE
Protein, ur: 30 mg/dL — AB
Specific Gravity, Urine: 1.013 (ref 1.005–1.030)
pH: 6 (ref 5.0–8.0)

## 2022-07-29 LAB — MAGNESIUM: Magnesium: 2.7 mg/dL — ABNORMAL HIGH (ref 1.7–2.4)

## 2022-07-29 LAB — PHOSPHORUS: Phosphorus: 3.3 mg/dL (ref 2.5–4.6)

## 2022-07-29 LAB — BASIC METABOLIC PANEL
Anion gap: 8 (ref 5–15)
BUN: 17 mg/dL (ref 8–23)
CO2: 27 mmol/L (ref 22–32)
Calcium: 10 mg/dL (ref 8.9–10.3)
Chloride: 116 mmol/L — ABNORMAL HIGH (ref 98–111)
Creatinine, Ser: 1.08 mg/dL — ABNORMAL HIGH (ref 0.44–1.00)
GFR, Estimated: 58 mL/min — ABNORMAL LOW (ref >60)
Glucose, Bld: 169 mg/dL — ABNORMAL HIGH (ref 70–99)
Potassium: 4.2 mmol/L (ref 3.5–5.1)
Sodium: 151 mmol/L — ABNORMAL HIGH (ref 135–145)

## 2022-07-29 LAB — PROCALCITONIN: Procalcitonin: <0.1 ng/mL

## 2022-07-29 MED ORDER — DEXTROSE 5 % IV SOLN: INTRAVENOUS | Status: DC

## 2022-07-29 MED ORDER — DEXTROSE 5 % IV BOLUS
500.0000 mL | Freq: Once | INTRAVENOUS | Status: AC
  Administered 2022-07-29: 500 mL via INTRAVENOUS

## 2022-07-29 NOTE — Progress Notes (Signed)
Speech Language Pathology Treatment: Dysphagia  Patient Details Name: Anna Berry MRN: [id number] DOB: [birth year] Today's Date: 07/29/2022 Time: 4098-1191 SLP Time Calculation (min) (ACUTE ONLY): 10 min  Assessment / Plan / Recommendation Clinical Impression  Pt was seen for dysphagia treatment, but it was limited due to pt's lethargy. Oral care was provided and pt's alertness improved with verbal and tactile stimulation. With verbal prompts she swallowed the trace thin water as the ice chips melted, but she did not demonstrate intentional bolus manipulation of ice chips or of puree (ultimately removed via oral swab). Additional boluses were deferred due to pt's lethargy. It is recommended that the pt's NPO status be continued and SLP will continue to follow pt.    HPI HPI: Pt is a 62 y.o. female who presented with difficulty speaking. MRI brain 5/11: Large area of hemorrhage (approximately 18 mL) in the left frontal lobe, within the anterior cerebral artery territory. Pt passed [facility] 5/11, but then had neuro changes and could not complete a repeat [facility] due to lethargy. PMH: sickle cell trait, sigmoid diverticulosis, asthma, OA; BSE completed on 07/26/22 with NPO recommendation. ST f/u for SLE and po readiness.      SLP Plan  Continue with current plan of care      Recommendations for follow up therapy are one component of a multi-disciplinary discharge planning process, led by the attending physician.  Recommendations may be updated based on patient status, additional functional criteria and insurance authorization.    Recommendations  Diet recommendations: NPO Medication Administration: Via alternative means                  Oral care QID;Staff/trained caregiver to provide oral care   Frequent or constant Supervision/Assistance Dysphagia, oropharyngeal phase (R13.12)     Continue with current plan of care    Jaythan Hinely I. Vear Clock, MS, CCC-SLP Neuro Diagnostic Specialist   Acute Rehabilitation Services Office number: [phone]  Scheryl Marten  07/29/2022, 11:27 AM

## 2022-07-29 NOTE — Progress Notes (Signed)
STROKE TEAM PROGRESS NOTE   SUBJECTIVE (INTERVAL HISTORY) Husband is at the bedside. Pt reclining in bed, still has global aphasia and right hemiplegia, still lethargic, but open eyes with voice.  OBJECTIVE Temp:  [98 F (36.7 C)-100.4 F (38 C)] 100.1 F (37.8 C) (05/15 0654) Pulse Rate:  [82-102] 96 (05/15 1546) Cardiac Rhythm: Sinus tachycardia (05/15 1035) Resp:  [16-23] 22 (05/15 0654) BP: (121-173)/(79-92) 164/86 (05/15 1546) SpO2:  [94 %-97 %] 97 % (05/15 1546) Weight:  [87.5 kg] 87.5 kg (05/15 0436)  Recent Labs  Lab 07/29/22 0309 07/29/22 0325 07/29/22 0658 07/29/22 1055 07/29/22 1634  GLUCAP 170* 148* 148* 145* 244*   Recent Labs  Lab 07/24/22 2035 07/24/22 2040 07/26/22 0255 07/27/22 0415 07/27/22 1752 07/28/22 0305 07/28/22 1811 07/29/22 0740  NA 140 140 143 141  --  149*  --  151*  K 4.3 4.2 3.3* 3.5  --  3.3*  --  4.2  CL 105 108 107 112*  --  117*  --  116*  CO2 24  --  26 23  --  23  --  27  GLUCOSE 107* 109* 133* 119*  --  117*  --  169*  BUN 10 14 9 9   --  15  --  17  CREATININE 1.12* 1.10* 0.94 0.91  --  0.85  --  1.08*  CALCIUM 9.5  --  9.3 9.1  --  8.2*  --  10.0  MG  --   --   --   --  2.4 2.2 2.7* 2.7*  PHOS  --   --   --   --  3.7 2.6 2.6 3.3   Recent Labs  Lab 07/24/22 2035 07/29/22 0740  AST 27 35  ALT 19 24  ALKPHOS 79 84  BILITOT 0.5 1.0  PROT 7.2 7.8  ALBUMIN 4.2 4.1   Recent Labs  Lab 07/24/22 2035 07/24/22 2040 07/26/22 0255 07/27/22 0415 07/28/22 0305 07/29/22 0740  WBC 11.3*  --  10.6* 10.7* 9.4 11.7*  NEUTROABS 8.6*  --   --   --   --   --   HGB 13.3 13.6 12.5 13.7 11.7* 14.1  HCT 42.5 40.0 37.4 41.7 35.6* 43.9  MCV 90.0  --  84.2 84.8 85.6 85.4  PLT 268  --  256 241 227 260    Recent Labs    07/29/22 1110  COLORURINE YELLOW  LABSPEC 1.013  PHURINE 6.0  GLUCOSEU NEGATIVE  HGBUR MODERATE*  BILIRUBINUR NEGATIVE  KETONESUR NEGATIVE  PROTEINUR 30*  NITRITE NEGATIVE  LEUKOCYTESUR MODERATE*        Component Value Date/Time   CHOL 164 07/25/2022 0623   CHOL 155 09/12/2021 1509   TRIG 52 07/25/2022 0623   HDL 49 07/25/2022 0623   HDL 46 09/12/2021 1509   CHOLHDL 3.3 07/25/2022 0623   VLDL 10 07/25/2022 0623   LDLCALC 105 (H) 07/25/2022 0623   LDLCALC 85 09/12/2021 1509   Lab Results  Component Value Date   HGBA1C 6.0 (H) 07/25/2022      Component Value Date/Time   LABOPIA NONE DETECTED 07/26/2022 1850   COCAINSCRNUR NONE DETECTED 07/26/2022 1850   LABBENZ NONE DETECTED 07/26/2022 1850   AMPHETMU NONE DETECTED 07/26/2022 1850   THCU NONE DETECTED 07/26/2022 1850   LABBARB NONE DETECTED 07/26/2022 1850    Recent Labs  Lab 07/24/22 2035  ETH <10    I have personally reviewed the radiological images below and agree with the radiology interpretations.  DG CHEST PORT 1 VIEW  Result Date: 07/29/2022 CLINICAL DATA:  New onset fevers. EXAM: PORTABLE CHEST 1 VIEW COMPARISON:  07/26/18 FINDINGS: There is a left arm PICC line with tip at the superior cavoatrial junction. Feeding tube courses below the level of the GE junction. Heart size appears normal. No pleural fluid or airspace disease. The visualized osseous structures are unremarkable. IMPRESSION: 1. No acute cardiopulmonary abnormalities. Electronically Signed   By: Signa Kell M.D.   On: 07/29/2022 13:58   DG Abd Portable 1V  Result Date: 07/27/2022 CLINICAL DATA:  Encounter for feeding tube placement. EXAM: PORTABLE ABDOMEN - 1 VIEW COMPARISON:  None. FINDINGS: Feeding tube tip projects over the distal stomach. The bowel gas pattern is normal. No radio-opaque calculi or other significant radiographic abnormality are seen. IMPRESSION: Feeding tube tip projects over the distal stomach. Electronically Signed   By: Orvan Falconer M.D.   On: 07/27/2022 14:56   ECHOCARDIOGRAM COMPLETE  Result Date: 07/27/2022    ECHOCARDIOGRAM REPORT   Patient Name:   Anna Berry Date of Exam: 07/27/2022 Medical Rec #:  [id number]         Height:       66.0 in Accession #:    [id number]       Weight:       192.2 lb Date of Birth:  06/29/1960       BSA:          1.966 m Patient Age:    61 years         BP:           133/74 mmHg Patient Gender: F                HR:           79 bpm. Exam Location:  Inpatient Procedure: 2D Echo, Cardiac Doppler and Color Doppler Indications:    Stroke  History:        Patient has no prior history of Echocardiogram examinations.                 Sickel Cell Trait.  Sonographer:    Raeford Razor Referring Phys: [id number] [facility] KHALIQDINA IMPRESSIONS  1. Left ventricular ejection fraction, by estimation, is 55 to 60%. The left ventricle has normal function. The left ventricle has no regional wall motion abnormalities. Left ventricular diastolic parameters are consistent with Grade I diastolic dysfunction (impaired relaxation).  2. Right ventricular systolic function is normal. The right ventricular size is normal.  3. The mitral valve is normal in structure. No evidence of mitral valve regurgitation. No evidence of mitral stenosis.  4. The aortic valve is tricuspid. Aortic valve regurgitation is not visualized. No aortic stenosis is present.  5. The inferior vena cava is normal in size with greater than 50% respiratory variability, suggesting right atrial pressure of 3 mmHg. Comparison(s): No prior Echocardiogram. FINDINGS  Left Ventricle: Left ventricular ejection fraction, by estimation, is 55 to 60%. The left ventricle has normal function. The left ventricle has no regional wall motion abnormalities. The left ventricular internal cavity size was normal in size. There is  no left ventricular hypertrophy. Left ventricular diastolic parameters are consistent with Grade I diastolic dysfunction (impaired relaxation). Right Ventricle: The right ventricular size is normal. Right ventricular systolic function is normal. Left Atrium: Left atrial size was normal in size. Right Atrium: Right atrial size was normal in size. Pericardium:  There is no evidence of pericardial effusion. Mitral Valve: The mitral valve  is normal in structure. No evidence of mitral valve regurgitation. No evidence of mitral valve stenosis. Tricuspid Valve: The tricuspid valve is normal in structure. Tricuspid valve regurgitation is trivial. No evidence of tricuspid stenosis. Aortic Valve: The aortic valve is tricuspid. Aortic valve regurgitation is not visualized. No aortic stenosis is present. Aortic valve mean gradient measures 5.0 mmHg. Aortic valve peak gradient measures 9.7 mmHg. Aortic valve area, by VTI measures 2.48 cm. Pulmonic Valve: The pulmonic valve was not well visualized. Pulmonic valve regurgitation is not visualized. No evidence of pulmonic stenosis. Aorta: The aortic root is normal in size and structure. Venous: The inferior vena cava is normal in size with greater than 50% respiratory variability, suggesting right atrial pressure of 3 mmHg. IAS/Shunts: No atrial level shunt detected by color flow Doppler.  LEFT VENTRICLE PLAX 2D LVIDd:         3.90 cm   Diastology LVIDs:         3.00 cm   LV e' medial:    6.96 cm/s LV PW:         0.90 cm   LV E/e' medial:  12.8 LV IVS:        0.60 cm   LV e' lateral:   11.30 cm/s LVOT diam:     2.10 cm   LV E/e' lateral: 7.9 LV SV:         62 LV SV Index:   31 LVOT Area:     3.46 cm  RIGHT VENTRICLE             IVC RV Basal diam:  2.00 cm     IVC diam: 1.00 cm RV S prime:     20.10 cm/s TAPSE (M-mode): 2.5 cm LEFT ATRIUM             Index        RIGHT ATRIUM           Index LA diam:        3.20 cm 1.63 cm/m   RA Area:     11.40 cm LA Vol (A2C):   27.1 ml 13.78 ml/m  RA Volume:   24.10 ml  12.26 ml/m LA Vol (A4C):   40.0 ml 20.34 ml/m LA Biplane Vol: 36.8 ml 18.71 ml/m  AORTIC VALVE AV Area (Vmax):    2.53 cm AV Area (Vmean):   2.38 cm AV Area (VTI):     2.48 cm AV Vmax:           156.00 cm/s AV Vmean:          108.000 cm/s AV VTI:            0.249 m AV Peak Grad:      9.7 mmHg AV Mean Grad:      5.0 mmHg LVOT  Vmax:         114.00 cm/s LVOT Vmean:        74.100 cm/s LVOT VTI:          0.178 m LVOT/AV VTI ratio: 0.71  AORTA Ao Root diam: 3.20 cm Ao Asc diam:  3.20 cm MITRAL VALVE                TRICUSPID VALVE MV Area (PHT): 5.79 cm     TR Peak grad:   23.6 mmHg MV E velocity: 88.80 cm/s   TR Vmax:        243.00 cm/s MV A velocity: 130.00 cm/s MV E/A ratio:  0.68  SHUNTS                             Systemic VTI:  0.18 m                             Systemic Diam: 2.10 cm Olga Millers MD Electronically signed by Olga Millers MD Signature Date/Time: 07/27/2022/12:38:46 PM    Final    CT HEAD WO CONTRAST ( )  Result Date: 07/27/2022 CLINICAL DATA:  Hemorrhagic stroke follow-up. EXAM: CT HEAD WITHOUT CONTRAST TECHNIQUE: Contiguous axial images were obtained from the base of the skull through the vertex without intravenous contrast. RADIATION DOSE REDUCTION: This exam was performed according to the departmental dose-optimization program which includes automated exposure control, adjustment of the mA and/or kV according to patient size and/or use of iterative reconstruction technique. COMPARISON:  Two days ago FINDINGS: Brain: Regional hemorrhage in edema facet patchy hemorrhage in regional edema in the left frontal lobe is unchanged with small volume subarachnoid hemorrhage descending the inter hemispheric fissure. Close proximity to the left lateral ventricle but no intraventricular extension. No hydrocephalus. Midline shift still measures 5 mm at the level of the septum pellucidum. Vascular: No hyperdense vessel or unexpected calcification. Skull: Normal. Negative for fracture or focal lesion. Sinuses/Orbits: No acute finding. IMPRESSION: Unchanged left frontal hemorrhage and edema with mild subarachnoid extension. No new or progressive finding. Midline shift measures 5 mm. Electronically Signed   By: Tiburcio Pea M.D.   On: 07/27/2022 05:43   EEG adult  Result Date: 07/25/2022 Jefferson Fuel, MD      07/25/2022  8:18 PM Routine EEG Report Anna Berry is a 62 y.o. female with a history of intracranial hemorrhage and altered mental status who is undergoing an EEG to evaluate for seizures. Report: This EEG was acquired with electrodes placed according to the International 10-20 electrode system (including Fp1, Fp2, F3, F4, C3, C4, P3, P4, O1, O2, T3, T4, T5, T6, A1, A2, Fz, Cz, Pz). The following electrodes were missing or displaced: none. The occipital dominant rhythm was 7 Hz. This activity is reactive to stimulation. Drowsiness was manifested by background fragmentation; deeper stages of sleep were not identified. There was focal slowing over the left frontal region. There were no interictal epileptiform discharges. There were no electrographic seizures identified. Photic stimulation and hyperventilation were not performed. Impression and clinical correlation: This EEG was obtained while awake and drowsy and is abnormal due to mild diffuse slowing indicative of global cerebral dysfunction and left frontal slowing over the area of intracranial hemorrhage. Epileptiform abnormalities were not seen during this recording. Bing Neighbors, MD [facility] [phone] If 7pm- 7am, please page neurology on call as listed in AMION.   CT HEAD WO CONTRAST ( )  Result Date: 07/25/2022 CLINICAL DATA:  Intracranial hemorrhage follow up EXAM: CT HEAD WITHOUT CONTRAST TECHNIQUE: Contiguous axial images were obtained from the base of the skull through the vertex without intravenous contrast. RADIATION DOSE REDUCTION: This exam was performed according to the departmental dose-optimization program which includes automated exposure control, adjustment of the mA and/or kV according to patient size and/or use of iterative reconstruction technique. COMPARISON:  07/25/2022 at 9:04 a.m. FINDINGS: Brain: Compared to the most recent CT, the large mixed intra-axial and extra-axial hematoma at the left frontal lobe is  unchanged. There is persistent rightward midline shift that measures 5 mm. Vascular: No hyperdense vessel or  unexpected calcification. Skull: Normal. Negative for fracture or focal lesion. Sinuses/Orbits: No acute finding. Other: None. IMPRESSION: Unchanged large mixed intra- and extra-axial hematoma at the left frontal lobe with persistent rightward midline shift that measures 5 mm. Electronically Signed   By: Deatra Robinson M.D.   On: 07/25/2022 20:10   [place] EKG SITE RITE  Result Date: 07/25/2022 If Site Rite image not attached, placement could not be confirmed due to current cardiac rhythm.  CT HEAD WO CONTRAST  Result Date: 07/25/2022 CLINICAL DATA:  Stroke follow-up EXAM: CT HEAD WITHOUT CONTRAST TECHNIQUE: Contiguous axial images were obtained from the base of the skull through the vertex without intravenous contrast. RADIATION DOSE REDUCTION: This exam was performed according to the departmental dose-optimization program which includes automated exposure control, adjustment of the mA and/or kV according to patient size and/or use of iterative reconstruction technique. COMPARISON:  CT and CTA from earlier today FINDINGS: Brain: Superior left frontal/parasagittal hematoma with progression in the adjacent frontal white matter. Hemorrhage is seen in both the parenchymal and subarachnoid compartments with mild brain edema. The area of newly affected brain had a normal appearance on the prior brain MRI. The CTV adn MRI are again reviewed and the superior sagittal sinus is patent and there is no detectable cortical vein thrombus. The new area of hemorrhage measures 3.4 x 1.8 x 1.7 cm. Rightward midline shift measures 5 mm. No hydrocephalus. Vascular: No hyperdense vessel or unexpected calcification. Skull: Normal. Negative for fracture or focal lesion. Sinuses/Orbits: No acute finding. Dr. Roda Shutters is aware of the findings at time of signing. IMPRESSION: Progression of left frontal ICH with new hematoma lateral to  the presenting hematoma in the frontal lobe. Midline shift now measures 5 mm. Electronically Signed   By: Tiburcio Pea M.D.   On: 07/25/2022 09:37   CT VENOGRAM HEAD  Result Date: 07/25/2022 CLINICAL DATA:  ICH EXAM: CT VENOGRAM HEAD TECHNIQUE: Venographic phase images of the brain were obtained following the administration of intravenous contrast. Multiplanar reformats and maximum intensity projections were generated. RADIATION DOSE REDUCTION: This exam was performed according to the departmental dose-optimization program which includes automated exposure control, adjustment of the mA and/or kV according to patient size and/or use of iterative reconstruction technique. CONTRAST:  75mL OMNIPAQUE IOHEXOL 350 MG/ML SOLN COMPARISON:  Head CT and brain MRI from earlier today FINDINGS: No dural venous sinus thrombosis. The superior sagittal sinus is diffusely patent, including adjacent to the known left frontal hemorrhage. Deep and cortical veins also show no signs of thrombosis. The left transverse sigmoid outflow is dominant without stricture or other filling defect seen. No masslike enhancement at the hematoma. Follow-up after hemorrhage resolution may be useful in detecting compressed vascular lesion. IMPRESSION: Negative for dural venous sinus thrombosis. No vascular lesion seen underlying the hemorrhage. Electronically Signed   By: Tiburcio Pea M.D.   On: 07/25/2022 05:10   CT ANGIO HEAD NECK W WO CM  Result Date: 07/25/2022 CLINICAL DATA:  Hemorrhagic stroke. EXAM: CT ANGIOGRAPHY HEAD AND NECK WITH AND WITHOUT CONTRAST TECHNIQUE: Multidetector CT imaging of the head and neck was performed using the standard protocol during bolus administration of intravenous contrast. Multiplanar CT image reconstructions and MIPs were obtained to evaluate the vascular anatomy. Carotid stenosis measurements (when applicable) are obtained utilizing NASCET criteria, using the distal internal carotid diameter as the  denominator. RADIATION DOSE REDUCTION: This exam was performed according to the departmental dose-optimization program which includes automated exposure control, adjustment of the mA and/or kV  according to patient size and/or use of iterative reconstruction technique. CONTRAST:  75mL OMNIPAQUE IOHEXOL 350 MG/ML SOLN COMPARISON:  Head CT from earlier today FINDINGS: CTA NECK FINDINGS Aortic arch: 2 vessel branching. Right carotid system: Vessels are smoothly contoured and diffusely patent Left carotid system: Vessels are smoothly contoured and diffusely patent Vertebral arteries: No vertebral or subclavian stenosis or ulceration. Skeleton: Generalized cervical spine degeneration. Other neck: Negative Upper chest: Negative Review of the MIP images confirms the above findings CTA HEAD FINDINGS Anterior circulation: No spot sign, aneurysm, or signs of vascular malformation underlying the acute hemorrhage. Major vessels are widely patent and smoothly contoured. Posterior circulation: No significant stenosis, proximal occlusion, aneurysm, or vascular malformation. Venous sinuses: Separate CT venogram. No dural venous sinus thrombosis or early enhancement. Anatomic variants: None significant Review of the MIP images confirms the above findings IMPRESSION: No vascular lesion or spot sign seen at the left frontal hemorrhage. Electronically Signed   By: Tiburcio Pea M.D.   On: 07/25/2022 05:08   CT Head Wo Contrast  Result Date: 07/25/2022 CLINICAL DATA:  Intracranial hemorrhage EXAM: CT HEAD WITHOUT CONTRAST TECHNIQUE: Contiguous axial images were obtained from the base of the skull through the vertex without intravenous contrast. RADIATION DOSE REDUCTION: This exam was performed according to the departmental dose-optimization program which includes automated exposure control, adjustment of the mA and/or kV according to patient size and/or use of iterative reconstruction technique. COMPARISON:  None Available.  FINDINGS: Brain: Large acute intraparenchymal hematoma of the anterior superior left frontal lobe with overlying subarachnoid blood. Volume is approximately 18 mL. The hematoma is located within the left ACA territory. No midline shift or other mass effect. Vascular: No abnormal hyperdensity of the major intracranial arteries or dural venous sinuses. No intracranial atherosclerosis. Skull: The visualized skull base, calvarium and extracranial soft tissues are normal. Sinuses/Orbits: No fluid levels or advanced mucosal thickening of the visualized paranasal sinuses. No mastoid or middle ear effusion. The orbits are normal. IMPRESSION: 1. Large acute intraparenchymal hematoma of the anterior superior left frontal lobe with overlying subarachnoid blood. Volume is approximately 18 mL. 2. No midline shift or other mass effect. Electronically Signed   By: Deatra Robinson M.D.   On: 07/25/2022 03:29   MR BRAIN WO CONTRAST  Result Date: 07/25/2022 CLINICAL DATA:  Altered mental status EXAM: MRI HEAD WITHOUT CONTRAST TECHNIQUE: Multiplanar, multiecho pulse sequences of the brain and surrounding structures were obtained without intravenous contrast. COMPARISON:  None Available. FINDINGS: Brain: There is a large area of hemorrhage (approximately 7.0 x 2.0 x 2.5 cm) in the left frontal lobe, within the anterior cerebral artery territory. Mild surrounding edema. No visible acute ischemia, though susceptibility effects from the left frontal blood cause artifacts on the diffusion-weighted imaging. Normal white matter signal, parenchymal volume and CSF spaces. The midline structures are normal. Vascular: Major flow voids are preserved. Skull and upper cervical spine: Normal calvarium and skull base. Visualized upper cervical spine and soft tissues are normal. Sinuses/Orbits:No paranasal sinus fluid levels or advanced mucosal thickening. No mastoid or middle ear effusion. Normal orbits. IMPRESSION: Large area of hemorrhage  (approximately 18 mL) in the left frontal lobe, within the anterior cerebral artery territory. The distribution raises suspicion for hemorrhagic conversion and ACA territory infarct. Head CT is recommended. Critical Value/emergent results were called by telephone at the time of interpretation on 07/25/2022 at 2:47 am to Dr. Preston Fleeting, who verbally acknowledged these results. Electronically Signed   By: Deatra Robinson M.D.   On:  07/25/2022 02:47     PHYSICAL EXAM  Temp:  [98 F (36.7 C)-100.4 F (38 C)] 100.1 F (37.8 C) (05/15 0654) Pulse Rate:  [82-102] 96 (05/15 1546) Resp:  [16-23] 22 (05/15 0654) BP: (121-173)/(79-92) 164/86 (05/15 1546) SpO2:  [94 %-97 %] 97 % (05/15 1546) Weight:  [87.5 kg] 87.5 kg (05/15 0436)  General: well-nourished, well-developed elderly female, sitting in chair, NAD. CV: normal rate and regular rhythm. Pulm: normal WOB on RA. Skin: warm and dry.  Neurological: eyes open, awake, but global aphasia, nonverbal and not following commands. PERRL with left gaze preference, not cross midline. R facial droop present. She does spontaneously move LUE and LLE antigravity. R hemiplegia. Sensation, coordination and gait not tested.    ASSESSMENT/PLAN Ms. Anna Berry is a 62 y.o. female with history of sickle cell trait, diverticulosis, asthma admitted for speech difficulty. No tPA given due to ICH.    ICH: Initial left frontal ICH with left ACA distribution, concerning for hemorrhagic transformation.  Then new left frontal ICH lateral to previous hematoma, etiology unclear MRI brain left frontal ICH, within the ACA territory. CT head left frontal ICH along left ACA territory with overlying SAH.  Volume is approximately 18 ml CT head and neck unremarkable CTV no venous thrombosis 2D Echo LVEF 55-60%, no RWMA, normal LA size, no interatrial shunt detected. EEG no seizure LDL 105 HgbA1c 6.0 UDS negative Subcutaneous heparin for VTE prophylaxis No antithrombotic prior  to admission, now on No antithrombotic due to ICH. Therapy recommendations: CIR Disposition: Pending  Cerebral edema CT repeat 5/11 8 AM progression of left frontal ICH with new hematoma lateral to the presenting hematoma in the frontal lobe, midline shift 5 mm CT repeat 5/11 unchanged mixed intra and extra-axial hematoma at left frontal lobe with persistent rightward midline shift and 5 mm Repeat CT 5/13 with unchanged L frontal hemorrhage and edema with mild subarachnoid extension, no new or progressive finding.  Na 143->141->149->151 On TF, monitor BMP  BP management  Stable CT repeat stable 5/13, will relax BP to 130-150 off Cleviprex  Long term BP goal normotensive  Hyperlipidemia Home meds: None LDL 105, goal < 70 Crestor 20 added Continue statin at discharge  Dysphagia Did not pass swallow N.p.o. for now Cortrak placed Now on tube feeding   Fever and leukocytosis Tmax 100.4 WBC 11.3-> 10.6-> 10.7->9.4-> 11.7 Lactic acid 3.2 Blood culture pending UA WBC 11-20 Management per primary team  Other Stroke Risk Factors Obesity, Body mass index is 31.14 kg/m.   Other Active Problems Sickle cell trait Diverticulosis  Hospital day # 4   Marvel Plan, MD PhD Stroke Neurology 07/29/2022 5:57 PM   To contact Stroke Continuity provider, please refer to [email or web address]. After hours, contact General Neurology

## 2022-07-29 NOTE — Progress Notes (Signed)
Made MD aware via secure chat that patient's temperature was 100.1. Also reported to oncoming RN, KIM

## 2022-07-29 NOTE — Progress Notes (Signed)
PROGRESS NOTE    Anna Berry  ZOX:[id number] DOB: 1961-02-09 DOA: 07/24/2022 PCP: Maury Dus, MD   Brief Narrative:  The patient is a 62 year old African-American female with past medical history significant for prolonged to sickle cell trait, sigmoid diverticulosis, asthma as well as osteoarthritis and other comorbidities who was brought to the hospital by EMS with inability to speak.  Per report she drove from at 5 PM walked into her house and had multiple concerns.  Family called EMS and she was brought in as a code stroke.  She is able to tell admitting physician her name and age and current month her voice is very hypophonic.  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  She was transferred out of the intensive care unit to the tried hospitalist service on 07/29/2022.   Assessment and Plan: No notes have been filed under this hospital service. Service: Hospitalist  ICH -Per Neuro -he had initial left frontal ICH with left ACA distribution with concern for hemorrhagic transformation and then had a new left frontal ICH lateral to the previous hematoma with unclear etiology -MRI and repeat CT done -CTV showed no venous thrombosis -Echocardiogram done and showed a normal EF of 50 to 55% with no regional wall motion abnormalities and normal left atrial size -EEG done and showed no seizure -Hemoglobin A1c was 6.0 and LDL was 105 -Not on any antithrombotics due to ICH -He is on a statin now -PT OT recommending CIR  Elevated blood pressure -Weaned off of Cleviprex -Given that her repeat CT scan on 530 was stable the neurologist physical extra blood pressure goal to 130s to 150s systolic and continues recommend long-term blood pressure goal being normotensive -Currently has labetalol 10 mg IV  every 2 as needed as needed for SBP above 150  Hyperlipidemia -Patient had a total cholesterol/HDL ratio of 3.3, cholesterol level 164, HDL of 49, LDL 105, triglycerides of 52, VLDL of 10 -Neurology is initiated the patient on a statin rosuvastatin 20 mg per tube daily  Dysphagia -Did not pass her swallow screen and remains n.p.o. for now -Speech therapy consulted and following for SLE and p.o. readiness -Has a small bore cortrak TF currently   Hypernatremia -Na+ Trend: Recent Labs  Lab 07/24/22 2035 07/24/22 2040 07/26/22 0255 07/27/22 0415 07/28/22 0305 07/29/22 0740  NA 140 140 143 141 149* 151*  -Initiate D5W at 75 MLS per hour given to 500 mL boluses -May need some free water flushes with her tube feeding and will need to speak with the nutritionist -Repeat CMP  Fever -Unclear Etiology -Spiked a Temperature of 100.4 -Pan Culture the Patient -WBC and Lactic Acid Trend: Recent Labs  Lab 07/24/22 2035 07/26/22 0255 07/27/22 0415 07/28/22 0305 07/29/22 0740 07/29/22 1204 07/29/22 1506  WBC 11.3* 10.6* 10.7* 9.4 11.7*  --   --   LATICACIDVEN  --   --   --   --   --    < > 3.2*   < > = values in this interval not displayed.  -PCT Negative at <0.10 -Checking blood cultures x 2 -Urinalysis done and showed a hazy appearance with moderate hemoglobin, moderate leukocytes, negative nitrites, many bacteria, 11-20 RBCs per high-power field, limited 20 WBCs and will send for urine culture before initiating antibiotics -Chest x-ray done and showed "There is a  left arm PICC line with tip at the superior cavoatrial junction. Feeding tube courses below the level of the GE junction. Heart size appears normal. No pleural fluid or airspace disease. The visualized osseous structures are unremarkable." -Given 2 IV fluid 500 mL boluses with D5W and started on D5 75 MLS per hour for maintenance given her hypernatremia -Continue to monitor for signs and symptoms of infection and repeat CBC  in a.m.  Lactic acidosis -Lactic acid trended up to 3.2 -Started IV fluid hydration D5W -Continue monitor and repeat  GERD/GI prophylaxis -Continue with Pantoprazole 40 mg IV nightly  Obesity -Complicates overall prognosis and care -Estimated body mass index is 31.14 kg/m as calculated from the following:   Height as of this encounter: 5\' 6"  (1.676 m).   Weight as of this encounter: 87.5 kg.  -Weight Loss and Dietary Counseling given   DVT prophylaxis: heparin injection 5,000 Units Start: 07/27/22 1300 SCD's Start: 07/25/22 0359    Code Status: Full Code Family Communication: No family currently at bedside  Disposition Plan:  Level of care: Telemetry Medical Status is: Inpatient Remains inpatient appropriate because: Needs further clinical improvement and evaluation for her fever   Consultants:  Neurology  Procedures:  As delineated as above Antimicrobials:  Anti-infectives (From admission, onward)    None       Subjective: Seen and examined at bedside that she did not follow commands or interact with me today.  Remains somnolent and a little drowsy.  Not very interactive and like a temperature this morning of unclear etiology  Objective: Vitals:   07/29/22 0310 07/29/22 0436 07/29/22 0654 07/29/22 1546  BP: (!) 153/90  (!) 173/90 (!) 164/86  Pulse: 98  88 96  Resp: 19  (!) 22   Temp: 98.4 F (36.9 C)  100.1 F (37.8 C)   TempSrc:   Oral   SpO2: 96%   97%  Weight:  87.5 kg    Height:        Intake/Output Summary (Last 24 hours) at 07/29/2022 1803 Last data filed at 07/29/2022 0600 Gross per 24 hour  Intake 270 ml  Output 1075 ml  Net -805 ml   Filed Weights   07/27/22 0545 07/28/22 0300 07/29/22 0436  Weight: 87.2 kg 85.5 kg 87.5 kg   Examination: Physical Exam:  Constitutional: WN/WD obese African-American female who appears to be in no acute distress Respiratory: Diminished to auscultation bilaterally, no wheezing, rales, rhonchi or crackles.  Normal respiratory effort and patient is not tachypenic. No accessory muscle use.  Unlabored breathing Cardiovascular: RRR, no murmurs / rubs / gallops. S1 and S2 auscultated. No extremity edema. Abdomen: Soft, non-tender, distended secondary to body habitus. Bowel sounds positive.  GU: Deferred. Musculoskeletal: No clubbing / cyanosis of digits/nails. No joint deformity upper and lower extremities.  Skin: No rashes, lesions, ulcers. No induration; Warm and dry.  Neurologic: Does not follow commands and remains nonverbal and has some hemiplegia. Psychiatric: Impaired judgment and insight  Data Reviewed: I have personally reviewed following labs and imaging studies  CBC: Recent Labs  Lab 07/24/22 2035 07/24/22 2040 07/26/22 0255 07/27/22 0415 07/28/22 0305 07/29/22 0740  WBC 11.3*  --  10.6* 10.7* 9.4 11.7*  NEUTROABS 8.6*  --   --   --   --   --   HGB 13.3 13.6 12.5 13.7 11.7* 14.1  HCT 42.5 40.0 37.4 41.7 35.6* 43.9  MCV 90.0  --  84.2 84.8 85.6 85.4  PLT 268  --  256 241 227 260   Basic Metabolic Panel: Recent Labs  Lab 07/24/22 2035 07/24/22 2040 07/26/22 0255 07/27/22 0415 07/27/22 1752 07/28/22 0305 07/28/22 1811 07/29/22 0740  NA 140 140 143 141  --  149*  --  151*  K 4.3 4.2 3.3* 3.5  --  3.3*  --  4.2  CL 105 108 107 112*  --  117*  --  116*  CO2 24  --  26 23  --  23  --  27  GLUCOSE 107* 109* 133* 119*  --  117*  --  169*  BUN 10 14 9 9   --  15  --  17  CREATININE 1.12* 1.10* 0.94 0.91  --  0.85  --  1.08*  CALCIUM 9.5  --  9.3 9.1  --  8.2*  --  10.0  MG  --   --   --   --  2.4 2.2 2.7* 2.7*  PHOS  --   --   --   --  3.7 2.6 2.6 3.3   GFR: Estimated Creatinine Clearance: 59.4 mL/min (A) (by C-G formula based on SCr of 1.08 mg/dL (H)). Liver Function Tests: Recent Labs  Lab 07/24/22 2035 07/29/22 0740  AST 27 35  ALT 19 24  ALKPHOS 79 84  BILITOT 0.5 1.0  PROT 7.2 7.8  ALBUMIN 4.2 4.1   No results for input(s): "LIPASE", "AMYLASE" in the last  168 hours. No results for input(s): "AMMONIA" in the last 168 hours. Coagulation Profile: Recent Labs  Lab 07/24/22 2035  INR 1.1   Cardiac Enzymes: No results for input(s): "CKTOTAL", "CKMB", "CKMBINDEX", "TROPONINI" in the last 168 hours. BNP (last 3 results) No results for input(s): "PROBNP" in the last 8760 hours. HbA1C: No results for input(s): "HGBA1C" in the last 72 hours. CBG: Recent Labs  Lab 07/29/22 0309 07/29/22 0325 07/29/22 0658 07/29/22 1055 07/29/22 1634  GLUCAP 170* 148* 148* 145* 244*   Lipid Profile: No results for input(s): "CHOL", "HDL", "LDLCALC", "TRIG", "CHOLHDL", "LDLDIRECT" in the last 72 hours. Thyroid Function Tests: No results for input(s): "TSH", "T4TOTAL", "FREET4", "T3FREE", "THYROIDAB" in the last 72 hours. Anemia Panel: No results for input(s): "VITAMINB12", "FOLATE", "FERRITIN", "TIBC", "IRON", "RETICCTPCT" in the last 72 hours. Sepsis Labs: Recent Labs  Lab 07/29/22 0740 07/29/22 1204 07/29/22 1506  PROCALCITON <0.10  --   --   LATICACIDVEN  --  2.3* 3.2*    Recent Results (from the past 240 hour(s))  MRSA Next Gen by PCR, Nasal     Status: None   Collection Time: 07/25/22  5:46 AM   Specimen: Nasal Mucosa; Nasal Swab  Result Value Ref Range Status   MRSA by PCR Next Gen NOT DETECTED NOT DETECTED Final    Comment: (NOTE) The GeneXpert MRSA Assay (FDA approved for NASAL specimens only), is one component of a comprehensive MRSA colonization surveillance program. It is not intended to diagnose MRSA infection nor to guide or monitor treatment for MRSA infections. Test performance is not FDA approved in patients less than 52 years old. Performed at [facility], [address], [place], [place] [address]      Radiology Studies: DG CHEST PORT 1 VIEW  Result Date: 07/29/2022 CLINICAL DATA:  New onset fevers. EXAM: PORTABLE CHEST 1 VIEW COMPARISON:  07/26/18 FINDINGS: There is a left arm PICC line with tip at the superior  cavoatrial junction. Feeding tube courses below the level of the GE junction. Heart size appears normal. No  pleural fluid or airspace disease. The visualized osseous structures are unremarkable. IMPRESSION: 1. No acute cardiopulmonary abnormalities. Electronically Signed   By: Signa Kell M.D.   On: 07/29/2022 13:58     Scheduled Meds:  Chlorhexidine Gluconate Cloth  6 each Topical Q0600   feeding supplement (PROSource TF20)  60 mL Per Tube Daily   heparin injection (subcutaneous)  5,000 Units Subcutaneous Q8H   mouth rinse  15 mL Mouth Rinse 4 times per day   pantoprazole (PROTONIX) IV  40 mg Intravenous QHS   polyethylene glycol  17 g Per Tube BID   rosuvastatin  20 mg Per Tube Daily   senna-docusate  1 tablet Per Tube BID   sodium chloride flush  10-40 mL Intracatheter Q12H   Continuous Infusions:  feeding supplement (JEVITY 1.5 CAL/FIBER) 1,000 mL (07/28/22 1710)    LOS: 4 days   Marguerita Merles, DO [facility] Available via Epic secure chat 7am-7pm After these hours, please refer to coverage provider listed on [email or web address] 07/29/2022, 6:03 PM

## 2022-07-29 NOTE — Progress Notes (Signed)
Physical Therapy Treatment Patient Details Name: Anna Berry MRN: [id number] DOB: Jan 30, 1961 Today's Date: 07/29/2022   History of Present Illness Pt is a 62 y.o. female who presented 07/25/22 with speech difficulties. MRI showed a L frontal ICH. Worsening neuro decline noted 5/11 after admitting pt. Repeat imaging showed new area of ICH lateral to current ICH with MLS 5 mm. PMH: Sickle cell trait, sigmoid diverticulosis, asthma, OA, obesity, pre-diabetes    PT Comments    Pt did open her eyes to prolonged stimulation supine in bed today and kept her eyes open >50% of session. She continues to be non-verbal and is unable to follow verbal commands alone, benefiting from physical guidance for her L hand placement to utilize it in assisting with functional mobility. Pt tearful during session with noted L eye redness, notified RN. [name] increased emotions may indicate improved awareness of the situation. In addition, she was pushing herself more strongly to her R with her L upper and lower extremity in sitting and standing today, again indicating potentially improved awareness as she is likely doing this to try to keep herself safe, even though she is pushing herself over to the weak side. Due to her increased pushing syndrome, she needed increased assistance for sitting and standing balance and total assist to pivot to her L. Will continue to follow acutely.      Recommendations for follow up therapy are one component of a multi-disciplinary discharge planning process, led by the attending physician.  Recommendations may be updated based on patient status, additional functional criteria and insurance authorization.  Follow Up Recommendations       Assistance Recommended at Discharge Frequent or constant Supervision/Assistance  Patient can return home with the following Two people to help with walking and/or transfers;A lot of help with bathing/dressing/bathroom;Assistance with  cooking/housework;Assistance with feeding;Direct supervision/assist for medications management;Direct supervision/assist for financial management;Assist for transportation;Help with stairs or ramp for entrance   Equipment Recommendations  Other (comment) (TBA)    Recommendations for Other Services       Precautions / Restrictions Precautions Precautions: Fall;Other (comment) Precaution Comments: L wrist restraint; cortak; SBP 130-150 goal; pushing syndrome Restrictions Weight Bearing Restrictions: No     Mobility  Bed Mobility Overal bed mobility: Needs Assistance Bed Mobility: Supine to Sit     Supine to sit: Max assist, HOB elevated     General bed mobility comments: Pt dependent on therapist to direct legs off L EOB but when PT held pt's L hand and gave a slight pull pt did pull back on it to assist in ascending her trunk to sit up L EOB, maxA.    Transfers Overall transfer level: Needs assistance Equipment used: 1 Morand hand held assist Transfers: Sit to/from Stand, Bed to chair/wheelchair/BSC Sit to Stand: Max assist Stand pivot transfers: Total assist         General transfer comment: Directed pt's L arm to [name] trunk for pt to pull on to stand up from EOB, R knee block provided. Rocked pt at trunk to facilitate pt initiation to stand but pt initially did not activate but with increased anterior weight shift cues she did activate to stand up with maxA, x3 reps from EOB. Pt actively pushing with her L leg towards her R, resisting pivoting to her L due to her pusher syndrome, needing total assist to transfer that direction to the chair.    Ambulation/Gait               General  Gait Details: unable   Stairs             Wheelchair Mobility    Modified Rankin (Stroke Patients Only) Modified Rankin (Stroke Patients Only) Pre-Morbid Rankin Score: No symptoms Modified Rankin: Severe disability     Balance Overall balance assessment: Needs  assistance Sitting-balance support: Single extremity supported, Feet supported Sitting balance-Leahy Scale: Poor Sitting balance - Comments: Posterior lean and pushing self with L UE to her R, more so today, needing mod-maxA for static sitting balance, min guard-minA for balance when propped on L elbow but needed assist to stay on L elbow and not push up to her L hand again. Postural control: Posterior lean, Right lateral lean Standing balance support: Single extremity supported Standing balance-Leahy Scale: Poor Standing balance comment: MaxA to stand with L UE support and R knee block, pt pushing self to R with L leg                            Cognition Arousal/Alertness: Lethargic, Awake/alert Behavior During Therapy: Flat affect Overall Cognitive Status: Difficult to assess                                 General Comments: Pt lethargic initially, but she did open her eyes once she was stimulated more. She was able to track to the L and slightly and briefly to the R but tends to prefer a forward gaze. Following <5% of multi-modal cues. Pushes more strongly with her L UE to her R now, demonstrating poor awareness of her deficits/midline.        Exercises      General Comments General comments (skin integrity, edema, etc.): BP 145/81 (99) sitting in recliner end of session; husband and cousin present during session      Pertinent Vitals/Pain Pain Assessment Pain Assessment: Faces Faces Pain Scale: Hurts little more Pain Location: grimacing with noxious stimuli/movement of cortrak, redness at L eye Pain Descriptors / Indicators: Discomfort, Grimacing, Crying Pain Intervention(s): Monitored during session, Limited activity within patient's tolerance, Repositioned    Home Living                          Prior Function            PT Goals (current goals can now be found in the care plan section) Acute Rehab PT Goals Patient Stated Goal: did  not state; family hopes for pt to improve PT Goal Formulation: With family Time For Goal Achievement: 08/09/22 Potential to Achieve Goals: Good Progress towards PT goals: Progressing toward goals    Frequency    Min 4X/week      PT Plan Current plan remains appropriate    Co-evaluation              AM-PAC PT "6 Clicks" Mobility   Outcome Measure  Help needed turning from your back to your side while in a flat bed without using bedrails?: A Lot Help needed moving from lying on your back to sitting on the side of a flat bed without using bedrails?: A Lot Help needed moving to and from a bed to a chair (including a wheelchair)?: Total Help needed standing up from a chair using your arms (e.g., wheelchair or bedside chair)?: A Lot Help needed to walk in hospital room?: Total Help needed climbing 3-5 steps with  a railing? : Total 6 Click Score: 9    End of Session Equipment Utilized During Treatment: Gait belt Activity Tolerance: Other (comment) (limited by cognitive and communication deficits) Patient left: with call bell/phone within reach;with family/visitor present;in chair;with chair alarm set;with restraints reapplied Nurse Communication: Mobility status;Need for lift equipment PT Visit Diagnosis: Unsteadiness on feet (R26.81);Muscle weakness (generalized) (M62.81);Difficulty in walking, not elsewhere classified (R26.2);Other symptoms and signs involving the nervous system (R29.898);Hemiplegia and hemiparesis Hemiplegia - Right/Left: Right Hemiplegia - dominant/non-dominant: Dominant Hemiplegia - caused by: Other Nontraumatic intracranial hemorrhage     Time: 1230-1308 PT Time Calculation (min) (ACUTE ONLY): 38 min  Charges:  $Therapeutic Activity: 23-37 mins $Neuromuscular Re-education: 8-22 mins                     Raymond Gurney, PT, DPT Acute Rehabilitation Services  Office: [phone]    Anna Berry 07/29/2022, 1:26 PM

## 2022-07-29 NOTE — Progress Notes (Signed)
PT still feels pt is an AIR candidate. CM met with the patient and her spouse. Pts insurance is not in network with [facility]. Spouse aware and interested in [facility]. Pt will need to have cortrak out and either be on a diet or have a PEG for HPIR to consider for a rehab admission. CM will follow and send her information to [facility] once her nutrition long term plan established. TOC following.

## 2022-07-29 NOTE — Progress Notes (Signed)
   07/29/22 0100  Assess: MEWS Score  Pulse Rate 82  Assess: MEWS Score  MEWS Temp 0  MEWS Systolic 0  MEWS Pulse 0  MEWS RR 0  MEWS LOC 1  MEWS Score 1  MEWS Score Color Green  Assess: if the MEWS score is Yellow or Red  Were vital signs taken at a resting state? No  Focused Assessment No change from prior assessment  Does the patient meet 2 or more of the SIRS criteria? No  MEWS guidelines implemented  No, vital signs rechecked  Assess: SIRS CRITERIA  SIRS Temperature  0  SIRS Pulse 0  SIRS Respirations  0  SIRS WBC 0  SIRS Score Sum  0     07/29/22 0100  Assess: MEWS Score  Pulse Rate 82  Assess: MEWS Score  MEWS Temp 0  MEWS Systolic 0  MEWS Pulse 0  MEWS RR 0  MEWS LOC 1  MEWS Score 1  MEWS Score Color Green  Assess: if the MEWS score is Yellow or Red  Were vital signs taken at a resting state? No  Focused Assessment No change from prior assessment  Does the patient meet 2 or more of the SIRS criteria? No  MEWS guidelines implemented  No, vital signs rechecked  Assess: SIRS CRITERIA  SIRS Temperature  0  SIRS Pulse 0  SIRS Respirations  0  SIRS WBC 0  SIRS Score Sum  0

## 2022-07-30 DIAGNOSIS — E870 Hyperosmolality and hypernatremia: Secondary | ICD-10-CM | POA: Diagnosis not present

## 2022-07-30 DIAGNOSIS — R509 Fever, unspecified: Secondary | ICD-10-CM | POA: Diagnosis not present

## 2022-07-30 DIAGNOSIS — I611 Nontraumatic intracerebral hemorrhage in hemisphere, cortical: Secondary | ICD-10-CM | POA: Diagnosis not present

## 2022-07-30 LAB — COMPREHENSIVE METABOLIC PANEL
ALT: 31 U/L (ref 0–44)
AST: 26 U/L (ref 15–41)
Albumin: 3.8 g/dL (ref 3.5–5.0)
Alkaline Phosphatase: 77 U/L (ref 38–126)
Anion gap: 9 (ref 5–15)
BUN: 17 mg/dL (ref 8–23)
CO2: 26 mmol/L (ref 22–32)
Calcium: 9.3 mg/dL (ref 8.9–10.3)
Chloride: 111 mmol/L (ref 98–111)
Creatinine, Ser: 1.09 mg/dL — ABNORMAL HIGH (ref 0.44–1.00)
GFR, Estimated: 57 mL/min — ABNORMAL LOW (ref >60)
Glucose, Bld: 193 mg/dL — ABNORMAL HIGH (ref 70–99)
Potassium: 3.9 mmol/L (ref 3.5–5.1)
Sodium: 146 mmol/L — ABNORMAL HIGH (ref 135–145)
Total Bilirubin: 0.6 mg/dL (ref 0.3–1.2)
Total Protein: 7.6 g/dL (ref 6.5–8.1)

## 2022-07-30 LAB — CBC WITH DIFFERENTIAL/PLATELET
Abs Immature Granulocytes: 0.04 10*3/uL (ref 0.00–0.07)
Basophils Absolute: 0.1 10*3/uL (ref 0.0–0.1)
Basophils Relative: 1 %
Eosinophils Absolute: 0 10*3/uL (ref 0.0–0.5)
Eosinophils Relative: 0 %
HCT: 43.6 % (ref 36.0–46.0)
Hemoglobin: 14.1 g/dL (ref 12.0–15.0)
Immature Granulocytes: 0 %
Lymphocytes Relative: 17 %
Lymphs Abs: 2.3 10*3/uL (ref 0.7–4.0)
MCH: 28 pg (ref 26.0–34.0)
MCHC: 32.3 g/dL (ref 30.0–36.0)
MCV: 86.7 fL (ref 80.0–100.0)
Monocytes Absolute: 1.4 10*3/uL — ABNORMAL HIGH (ref 0.1–1.0)
Monocytes Relative: 11 %
Neutro Abs: 9.2 10*3/uL — ABNORMAL HIGH (ref 1.7–7.7)
Neutrophils Relative %: 71 %
Platelets: 248 10*3/uL (ref 150–400)
RBC: 5.03 MIL/uL (ref 3.87–5.11)
RDW: 15.8 % — ABNORMAL HIGH (ref 11.5–15.5)
WBC: 13 10*3/uL — ABNORMAL HIGH (ref 4.0–10.5)
nRBC: 0 % (ref 0.0–0.2)

## 2022-07-30 LAB — GLUCOSE, CAPILLARY
Glucose-Capillary: 220 mg/dL — ABNORMAL HIGH (ref 70–99)
Glucose-Capillary: 202 mg/dL — ABNORMAL HIGH (ref 70–99)
Glucose-Capillary: 184 mg/dL — ABNORMAL HIGH (ref 70–99)
Glucose-Capillary: 174 mg/dL — ABNORMAL HIGH (ref 70–99)
Glucose-Capillary: 184 mg/dL — ABNORMAL HIGH (ref 70–99)

## 2022-07-30 LAB — MAGNESIUM: Magnesium: 2.6 mg/dL — ABNORMAL HIGH (ref 1.7–2.4)

## 2022-07-30 LAB — CULTURE, BLOOD (ROUTINE X 2)

## 2022-07-30 LAB — LACTIC ACID, PLASMA
Lactic Acid, Venous: 1.5 mmol/L (ref 0.5–1.9)
Lactic Acid, Venous: 1.4 mmol/L (ref 0.5–1.9)

## 2022-07-30 LAB — PHOSPHORUS: Phosphorus: 3.8 mg/dL (ref 2.5–4.6)

## 2022-07-30 MED ORDER — FREE WATER
150.0000 mL | Status: DC
  Administered 2022-07-30 – 2022-07-31 (×7): 150 mL

## 2022-07-30 NOTE — Progress Notes (Signed)
Nutrition Follow-up  DOCUMENTATION CODES:   Not applicable  INTERVENTION:  Continue Jevity 1.5 at goal rate of 50 ml/hr (1200 ml/day) - PROSource TF20 60 ml daily - free water flush of q4h - Tube feeding regimen at goal rate provides 1880 kcal, 96.5 grams of protein, and 912 ml of H2O.(18110mL = TF+flush) Consider consult to IR for PEG placement if diet cannot be recommended   NUTRITION DIAGNOSIS:  Inadequate oral intake related to lethargy/confusion, inability to eat as evidenced by NPO status. - remains applicable   GOAL:  Patient will meet greater than or equal to 90% of their needs - progressing, being met with TF at goal  MONITOR:   Diet advancement, Labs, Weight trends, TF tolerance  REASON FOR ASSESSMENT:   Consult Enteral/tube feeding initiation and management  ASSESSMENT:  62 year old female who presented to the ED on 5/10 as a Code Stroke. PMH of sickle cell trait, sigmoid diverticulosis, asthma, OA. Pt admitted with L frontal ICH with L ACA distribution, concerning for hemorrhagic transformation.  5/11 - new L frontal ICH alteral to previous hematoma 5/13 - SLP recommending NPO, Cortrak placement (distal stomach) 5/14 - moved out of ICU to floor 5/15 - SLP evaluation, NPO continued. Pt did not demonstrate intentional bolus manipulation of ice chips or of puree  Pt able to be moved out of ICU but remains confused and unable to demonstrate a safe swallow at last SLP evaluation 5/15. Remains NPO at this time. Noted Na trending up prior to initiation of D5, will add free water flushes to provide hydration.  TF infusing at goal rate of 15mL/h. RN reports good tolerance to regimen so far.   Noted in CM note that pt's insurance does not cover CIR but will cover inpatient rehabs at other facilities. However, pt would not be able to dc with a cortrak to outside facilities. Pt currently not medically stable to dc but it is likely that pt will be medically optimized  prior to improvement in swallowing functio. If this is the case, she would benefit from PEG placement to ensure a quicker transition to rehab.   Communicated to MD the addition of free water and also the recommendation that IR be consulted in order to have pt evaluated for PEG placement if needed.  Nutritionally Relevant Medications: Scheduled Meds:  feeding supplement (PROSource TF20)  60 mL Per Tube Daily   pantoprazole (PROTONIX) IV  40 mg Intravenous QHS   polyethylene glycol  17 g Per Tube BID   rosuvastatin  20 mg Per Tube Daily   senna-docusate  1 tablet Per Tube BID   Continuous Infusions:  dextrose 75 mL/hr at 07/30/22 0500   feeding supplement (JEVITY 1.5 CAL/FIBER) 1,000 mL (07/28/22 1710)   Labs Reviewed: Na 146, adding FWF today. D5 infusing and Na improved over the last 24h Creatinine 1.09 Mg 2.6 CBG ranges from 145-244 mg/dL over the last 24 hours  NUTRITION - FOCUSED PHYSICAL EXAM: Flowsheet Row Most Recent Value  Orbital Region Moderate depletion  Upper Arm Region No depletion  Thoracic and Lumbar Region No depletion  Buccal Region Mild depletion  Temple Region No depletion  Clavicle Bone Region No depletion  Clavicle and Acromion Bone Region Mild depletion  Scapular Bone Region No depletion  Dorsal Hand No depletion  Patellar Region No depletion  Anterior Thigh Region No depletion  Posterior Calf Region No depletion  Edema (RD Assessment) Mild  [BLE]  Hair Reviewed  Eyes Reviewed  Mouth Reviewed  Skin  Reviewed  Nails Reviewed   Diet Order:   Diet Order             Diet NPO time specified  Diet effective now                   EDUCATION NEEDS:  Education needs have been addressed  Skin:  Skin Assessment: Reviewed RN Assessment  Last BM:  5/14  Height:  Ht Readings from Last 1 Encounters:  07/25/22 5\' 6"  (1.676 m)    Weight:  Wt Readings from Last 1 Encounters:  07/29/22 87.5 kg    Ideal Body Weight:  59.1 kg  BMI:  Body mass  index is 31.14 kg/m.  Estimated Nutritional Needs:  Kcal:  1750-1950 Protein:  90-110 grams Fluid:  1.7-1.9 L    Greig Castilla, RD, LDN Clinical Dietitian RD pager # available in AMION  After hours/weekend pager # available in [facility]

## 2022-07-30 NOTE — Progress Notes (Signed)
Occupational Therapy Treatment Patient Details Name: Anna Berry MRN: [id number] DOB: 1960-10-24 Today's Date: 07/30/2022   History of present illness Pt is a 62 y.o. female who presented 07/25/22 with speech difficulties. MRI showed a L frontal ICH. Worsening neuro decline noted 5/11 after admitting pt. Repeat imaging showed new area of ICH lateral to current ICH with MLS 5 mm. PMH: Sickle cell trait, sigmoid diverticulosis, asthma, OA, obesity, pre-diabetes   OT comments  Pt remains limited by lethargy requiring Total A x 2 for bed mobility. Pt would briefly have eyes open w/ specific stimulation (washing face which pt did assist with some today, as well as husband's voice on phone). Pt w/ pushing tendencies w/ L UE w/ emphasis on WB and activities to redirect from pushing during session today. [name] best friend, Wilnette Kales at bedside and supportive. Pending progression of lethargy, would recommend consideration of intensive postacute rehab based on high PLOF.   Recommendations for follow up therapy are one component of a multi-disciplinary discharge planning process, led by the attending physician.  Recommendations may be updated based on patient status, additional functional criteria and insurance authorization.    Assistance Recommended at Discharge Frequent or constant Supervision/Assistance  Patient can return home with the following  Two people to help with walking and/or transfers;Two people to help with bathing/dressing/bathroom;Assistance with cooking/housework;Direct supervision/assist for medications management;Assistance with feeding;Assist for transportation;Direct supervision/assist for financial management;Help with stairs or ramp for entrance   Equipment Recommendations  Other (comment) (TBD pending progress)    Recommendations for Other Services Rehab consult    Precautions / Restrictions Precautions Precautions: Fall;Other (comment) Precaution Comments: L wrist restraint;  cortak; SBP 130-150 goal; pushing syndrome Restrictions Weight Bearing Restrictions: No       Mobility Bed Mobility Overal bed mobility: Needs Assistance Bed Mobility: Rolling, Sidelying to Sit, Sit to Sidelying Rolling: Total assist, +2 for physical assistance, +2 for safety/equipment Sidelying to sit: Total assist, +2 for physical assistance, +2 for safety/equipment   Sit to supine: Total assist, +2 for physical assistance, +2 for safety/equipment        Transfers                         Balance Overall balance assessment: Needs assistance Sitting-balance support: Single extremity supported, Feet supported Sitting balance-Leahy Scale: Poor Sitting balance - Comments: posterior lean and pushing self to R with L UE. guided pt in WB through L elbow to break pushing tendencies, as well as toward R UE. pt able to push self back up from L elbow with min guard though Total A to come back up from R elbow. able to briefly sit min guard for 5 sec Postural control: Posterior lean, Right lateral lean                                 ADL either performed or assessed with clinical judgement   ADL Overall ADL's : Needs assistance/impaired     Grooming: Maximal assistance;Bed level;Wash/dry face Grooming Details (indicate cue type and reason): initially needed assist to wash eyes in attempts to rouse pt with eye opening noted. Placed washcloth in hand and brought hand to face with pt then wiping L eye without assist.         Upper Body Dressing : Total assistance;Bed level Upper Body Dressing Details (indicate cue type and reason): new gown Lower Body Dressing: Total assistance;Bed level  Lower Body Dressing Details (indicate cue type and reason): R prafo               General ADL Comments: Focus on sitting balance w/ assist to correct pushing tendencies with L UE    Extremity/Trunk Assessment Upper Extremity Assessment Upper Extremity Assessment:  Difficult to assess due to impaired cognition;RUE deficits/detail RUE Deficits / Details: no appreciable active ROM RUE Coordination: decreased fine motor;decreased gross motor   Lower Extremity Assessment Lower Extremity Assessment: Defer to PT evaluation        Vision   Vision Assessment?: Vision impaired- to be further tested in functional context Additional Comments: to be further assessed; kept eyes closed for much of session   Perception     Praxis      Cognition Arousal/Alertness: Lethargic Behavior During Therapy: Flat affect Overall Cognitive Status: Difficult to assess Area of Impairment: Attention, Following commands, Awareness                   Current Attention Level: Focused   Following Commands: Follows one step commands inconsistently   Awareness: Intellectual   General Comments: Pt lethargic, did wake up with stimulation from washcloth on face and did follow command to wash face after assist to initiate task though overall minimal command following. brief eye opening but did not sustain attention to tasks.        Exercises Exercises: Other exercises Other Exercises Other Exercises: WB through B elbow EOB    Shoulder Instructions       General Comments best friend, thelma present and did have pt husband on phone. With husband on speakerphone, pt did flutter eyes and briefly attend to his voice.    Pertinent Vitals/ Pain       Pain Assessment Pain Assessment: Faces Faces Pain Scale: No hurt Pain Intervention(s): Monitored during session  Home Living                                          Prior Functioning/Environment              Frequency  Min 2X/week        Progress Toward Goals  OT Goals(current goals can now be found in the care plan section)  Progress towards OT goals: OT to reassess next treatment  Acute Rehab OT Goals Patient Stated Goal: none stated but friend present hopeful that pt wakes up  more and gets back to dancing OT Goal Formulation: Patient unable to participate in goal setting Time For Goal Achievement: 08/10/22 Potential to Achieve Goals: Good ADL Goals Pt Will Perform Grooming: with mod assist;sitting Pt Will Perform Upper Body Dressing: with mod assist;sitting Pt Will Transfer to Toilet: with max assist;stand pivot transfer;bedside commode Additional ADL Goal #1: Pt will complete bed mobility with mod A  as a precursor to ADLs Additional ADL Goal #2: Pt will follow simple 1 step commands to sequence through ADL task with min cues  Plan Discharge plan remains appropriate    Co-evaluation                 AM-PAC OT "6 Clicks" Daily Activity     Outcome Measure   Help from another Racz eating meals?: Total Help from another Hardwick taking care of personal grooming?: A Lot Help from another Deen toileting, which includes using toliet, bedpan, or urinal?: Total Help from another Rew  bathing (including washing, rinsing, drying)?: Total Help from another Shark to put on and taking off regular upper body clothing?: Total Help from another Zollars to put on and taking off regular lower body clothing?: Total 6 Click Score: 7    End of Session    OT Visit Diagnosis: Unsteadiness on feet (R26.81);Other abnormalities of gait and mobility (R26.89);Muscle weakness (generalized) (M62.81);Hemiplegia and hemiparesis Hemiplegia - Right/Left: Right Hemiplegia - caused by: Other Nontraumatic intracranial hemorrhage   Activity Tolerance Patient limited by lethargy   Patient Left in bed;with call bell/phone within reach;with bed alarm set;with restraints reapplied;with family/visitor present   Nurse Communication Mobility status        Time: 1610-9604 OT Time Calculation (min): 35 min  Charges: OT General Charges $OT Visit: 1 Visit OT Treatments $Self Care/Home Management : 8-22 mins $Therapeutic Activity: 8-22 mins  Bradd Canary, OTR/L Acute Rehab  Services Office: [phone]   Lorre Munroe 07/30/2022, 11:15 AM

## 2022-07-30 NOTE — Progress Notes (Signed)
PROGRESS NOTE    Anna Berry  ZOX:[id number] DOB: 10-Oct-1960 DOA: 07/24/2022 PCP: Maury Dus, MD   Brief Narrative:  The patient is a 62 year old African-American female with past medical history significant for prolonged to sickle cell trait, sigmoid diverticulosis, asthma as well as osteoarthritis and other comorbidities who was brought to the hospital by EMS with inability to speak. Per report she drove from at 5 PM walked into her house and had multiple concerns. Family called EMS and she was brought in as a code stroke. She is able to tell admitting physician her name and age and current month her voice is very hypophonic. 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 She was transferred out of the intensive care unit to the tried hospitalist service on 07/29/2022.   The day that she was transferred to the [facility] service she spiked a temperature so further workup is being done.  Nursing states that she is starting have some diarrhea so we will check her for an infectious etiology.  Currently unclear etiology of her fevers.  Has a Cortrak in place.  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 distribution with concern for hemorrhagic transformation and then had a new left frontal ICH lateral to the previous hematoma with unclear etiology -MRI and repeat CT done -CTV showed no venous thrombosis -Echocardiogram done and showed a normal EF of 50 to 55% with no regional wall motion abnormalities and normal left atrial size -EEG done and showed no seizure -Hemoglobin A1c was 6.0 and LDL was 105 -Not on any antithrombotics due to ICH -He is on a statin now -PT OT recommending Inpatient Rehab but does not have insurance coverage for inpatient rehab here and cannot go  to rehab with Cortrak   Elevated blood pressure -Weaned off of Cleviprex -Given that her repeat CT scan on 530 was stable the neurologist physical extra blood pressure goal to 130s to 150s systolic and continues recommend long-term blood pressure goal being normotensive -Currently has labetalol 10 mg IV every 2 as needed as needed for SBP above 150 -Last BP was 138/79   Hyperlipidemia -Patient had a total cholesterol/HDL ratio of 3.3, cholesterol level 164, HDL of 49, LDL 105, triglycerides of 52, VLDL of 10 -Neurology is initiated the patient on a statin rosuvastatin 20 mg per tube daily   Dysphagia -Did not pass her swallow screen and remains n.p.o. for now -Speech therapy consulted and following for SLE and p.o. readiness -Has a small bore cortrak TF currently and still not awake enough and likely need a PEG   Hypernatremia -Na+ Trend: Recent Labs  Lab 07/24/22 2035 07/24/22 2040 07/26/22 0255 07/27/22 0415 07/28/22 0305 07/29/22 0740 07/30/22 0429  NA 140 140 143 141 149* 151* 146*  -Initiated D5W at 75 MLS per hour given two 500 mL boluses yesterday but will now stop as iis  -May need some free water flushes with her tube feeding and will need to speak with the nutritionist -Repeat CMP   Fever -Unclear Etiology -Spiked a Temperature of 100.4 yesterday and wet up to 101.2 -Pan Culture the Patient -WBC and Lactic Acid Trend: Recent Labs  Lab 07/24/22 2035 07/26/22 0255 07/27/22 0415 07/28/22 0305 07/29/22 0740 07/29/22 1204 07/30/22 0429 07/30/22 0849 07/30/22 1149  WBC 11.3*  10.6* 10.7* 9.4 11.7*  --  13.0*  --   --   LATICACIDVEN  --   --   --   --   --    < >  --    < > 1.4   < > = values in this interval not displayed.  -PCT Negative at <0.10 -Checking blood cultures x 2 and showing NGTD < 24 hours -Urinalysis done and showed a hazy appearance with moderate hemoglobin, moderate leukocytes, negative nitrites, many bacteria, 11-20 RBCs per high-power field,  limited 20 WBCs and will send for urine culture before initiating antibiotics; Urine Cx pending  -Chest x-ray done and showed "There is a left arm PICC line with tip at the superior cavoatrial junction. Feeding tube courses below the level of the GE junction. Heart size appears normal. No pleural fluid or airspace disease. The visualized osseous structures are unremarkable." -IVF now to stopped  -Continue to monitor for signs and symptoms of infection and repeat CBC in a.m.   Lactic Acidosis -Lactic acid level trend: Recent Labs  Lab 07/29/22 1204 07/29/22 1506 07/30/22 0849 07/30/22 1149  LATICACIDVEN 2.3* 3.2* 1.5 1.4  -Started IV fluid hydration D5W and now stopped and on [employer] Flushes -Will not repeat now improved   GERD/GI prophylaxis -Continue with Pantoprazole 40 mg IV nightly   Obesity -Complicates overall prognosis and care -Estimated body mass index is 31.14 kg/m as calculated from the following:   Height as of this encounter: 5\' 6"  (1.676 m).   Weight as of this encounter: 87.5 kg.  -Weight Loss and Dietary Counseling will be given once more awake  DVT prophylaxis: heparin injection 5,000 Units Start: 07/27/22 1300 SCD's Start: 07/25/22 0359    Code Status: Full Code Family Communication: Discussed with friend at bedside  Disposition Plan:  Level of care: Telemetry Medical Status is: Inpatient Remains inpatient appropriate because: His further clinical improvement and needs inpatient rehabilitation once stable for discharge   Consultants:  Neurology  Procedures:  As delineated as above  Antimicrobials:  Anti-infectives (From admission, onward)    None       Subjective: Seen and examined at bedside and continues to not follow commands or interact with me today.  She has remained somnolent and drowsy.  Had a temperature yesterday but had a low-grade temperature today.  Patient was saying that she is having little bit more looser  stools.  Objective: Vitals:   07/30/22 0455 07/30/22 0900 07/30/22 1112 07/30/22 1504  BP:  (!) 160/89 (!) 142/80 138/79  Pulse:  95 93 88  Resp:  17 18 18   Temp: 99.2 F (37.3 C) 100 F (37.8 C) 99.5 F (37.5 C) 99.7 F (37.6 C)  TempSrc: Oral Axillary Oral Oral  SpO2:  95% 96% 95%  Weight:      Height:        Intake/Output Summary (Last 24 hours) at 07/30/2022 1909 Last data filed at 07/30/2022 1500 Gross per 24 hour  Intake 1000 ml  Output 1000 ml  Net 0 ml   Filed Weights   07/27/22 0545 07/28/22 0300 07/29/22 0436  Weight: 87.2 kg 85.5 kg 87.5 kg   Examination: Physical Exam:  Constitutional: WN/WD obese African-American female who is in no acute distress and is not very interactive Respiratory: Diminished to auscultation bilaterally, no wheezing, rales, rhonchi or crackles. Normal respiratory effort and patient is not tachypenic. No accessory muscle use.  Cardiovascular: RRR, no murmurs / rubs / gallops. S1 and S2 auscultated.  No extremity edema. Abdomen: Soft, non-tender, distended secondary to body habitus. Bowel sounds positive.  GU: Deferred. Musculoskeletal: No clubbing / cyanosis of digits/nails. No joint deformity upper and lower extremities. Skin: No rashes, lesions, ulcers on a limited skin evaluation. No induration; Warm and dry.  Neurologic: Does not follow commands and remains nonverbal Psychiatric: Impaired judgment and insight good current condition  Data Reviewed: I have personally reviewed following labs and imaging studies  CBC: Recent Labs  Lab 07/24/22 2035 07/24/22 2040 07/26/22 0255 07/27/22 0415 07/28/22 0305 07/29/22 0740 07/30/22 0429  WBC 11.3*  --  10.6* 10.7* 9.4 11.7* 13.0*  NEUTROABS 8.6*  --   --   --   --   --  9.2*  HGB 13.3   < > 12.5 13.7 11.7* 14.1 14.1  HCT 42.5   < > 37.4 41.7 35.6* 43.9 43.6  MCV 90.0  --  84.2 84.8 85.6 85.4 86.7  PLT 268  --  256 241 227 260 248   < > = values in this interval not displayed.    Basic Metabolic Panel: Recent Labs  Lab 07/26/22 0255 07/27/22 0415 07/27/22 1752 07/28/22 0305 07/28/22 1811 07/29/22 0740 07/30/22 0429  NA 143 141  --  149*  --  151* 146*  K 3.3* 3.5  --  3.3*  --  4.2 3.9  CL 107 112*  --  117*  --  116* 111  CO2 26 23  --  23  --  27 26  GLUCOSE 133* 119*  --  117*  --  169* 193*  BUN 9 9  --  15  --  17 17  CREATININE 0.94 0.91  --  0.85  --  1.08* 1.09*  CALCIUM 9.3 9.1  --  8.2*  --  10.0 9.3  MG  --   --  2.4 2.2 2.7* 2.7* 2.6*  PHOS  --   --  3.7 2.6 2.6 3.3 3.8   GFR: Estimated Creatinine Clearance: 58.9 mL/min (A) (by C-G formula based on SCr of 1.09 mg/dL (H)). Liver Function Tests: Recent Labs  Lab 07/24/22 2035 07/29/22 0740 07/30/22 0429  AST 27 35 26  ALT 19 24 31   ALKPHOS 79 84 77  BILITOT 0.5 1.0 0.6  PROT 7.2 7.8 7.6  ALBUMIN 4.2 4.1 3.8   No results for input(s): "LIPASE", "AMYLASE" in the last 168 hours. No results for input(s): "AMMONIA" in the last 168 hours. Coagulation Profile: Recent Labs  Lab 07/24/22 2035  INR 1.1   Cardiac Enzymes: No results for input(s): "CKTOTAL", "CKMB", "CKMBINDEX", "TROPONINI" in the last 168 hours. BNP (last 3 results) No results for input(s): "PROBNP" in the last 8760 hours. HbA1C: No results for input(s): "HGBA1C" in the last 72 hours. CBG: Recent Labs  Lab 07/29/22 2358 07/30/22 0324 07/30/22 0941 07/30/22 1109 07/30/22 1504  GLUCAP 180* 220* 202* 184* 174*   Lipid Profile: No results for input(s): "CHOL", "HDL", "LDLCALC", "TRIG", "CHOLHDL", "LDLDIRECT" in the last 72 hours. Thyroid Function Tests: No results for input(s): "TSH", "T4TOTAL", "FREET4", "T3FREE", "THYROIDAB" in the last 72 hours. Anemia Panel: No results for input(s): "VITAMINB12", "FOLATE", "FERRITIN", "TIBC", "IRON", "RETICCTPCT" in the last 72 hours. Sepsis Labs: Recent Labs  Lab 07/29/22 0740 07/29/22 1204 07/29/22 1506 07/30/22 0849 07/30/22 1149  PROCALCITON <0.10  --   --   --    --   LATICACIDVEN  --  2.3* 3.2* 1.5 1.4   Recent Results (from the past 240 hour(s))  MRSA Next Gen by PCR, Nasal     Status: None   Collection Time: 07/25/22  5:46 AM   Specimen: Nasal Mucosa; Nasal Swab  Result Value Ref Range Status   MRSA by PCR Next Gen NOT DETECTED NOT DETECTED Final    Comment: (NOTE) The GeneXpert MRSA Assay (FDA approved for NASAL specimens only), is one component of a comprehensive MRSA colonization surveillance program. It is not intended to diagnose MRSA infection nor to guide or monitor treatment for MRSA infections. Test performance is not FDA approved in patients less than 33 years old. Performed at [facility], [address] [place], [place] [address]   Culture, blood (Routine X 2) w Reflex to ID Panel     Status: None (Preliminary result)   Collection Time: 07/29/22 12:04 PM   Specimen: BLOOD RIGHT HAND  Result Value Ref Range Status   Specimen Description BLOOD RIGHT HAND  Final   Special Requests   Final    BOTTLES DRAWN AEROBIC AND ANAEROBIC Blood Culture adequate volume   Culture   Final    NO GROWTH < 24 HOURS Performed at [facility], [address], [place], [place] [address]    Report Status PENDING  Incomplete  Culture, blood (Routine X 2) w Reflex to ID Panel     Status: None (Preliminary result)   Collection Time: 07/29/22 12:04 PM   Specimen: BLOOD RIGHT ARM  Result Value Ref Range Status   Specimen Description BLOOD RIGHT ARM  Final   Special Requests   Final    BOTTLES DRAWN AEROBIC AND ANAEROBIC Blood Culture results may not be optimal due to an inadequate volume of blood received in culture bottles   Culture   Final    NO GROWTH < 24 HOURS Performed at [facility], [address] [place], [place] [address]    Report Status PENDING  Incomplete     Radiology Studies: DG CHEST PORT 1 VIEW  Result Date: 07/29/2022 CLINICAL DATA:  New onset fevers. EXAM: PORTABLE CHEST 1 VIEW COMPARISON:  07/26/18  FINDINGS: There is a left arm PICC line with tip at the superior cavoatrial junction. Feeding tube courses below the level of the GE junction. Heart size appears normal. No pleural fluid or airspace disease. The visualized osseous structures are unremarkable. IMPRESSION: 1. No acute cardiopulmonary abnormalities. Electronically Signed   By: Signa Kell M.D.   On: 07/29/2022 13:58    Scheduled Meds:  Chlorhexidine Gluconate Cloth  6 each Topical Q0600   feeding supplement (PROSource TF20)  60 mL Per Tube Daily   free water  150 mL Per Tube Q4H   heparin injection (subcutaneous)  5,000 Units Subcutaneous Q8H   mouth rinse  15 mL Mouth Rinse 4 times per day   pantoprazole (PROTONIX) IV  40 mg Intravenous QHS   polyethylene glycol  17 g Per Tube BID   rosuvastatin  20 mg Per Tube Daily   senna-docusate  1 tablet Per Tube BID   sodium chloride flush  10-40 mL Intracatheter Q12H   Continuous Infusions:  feeding supplement (JEVITY 1.5 CAL/FIBER) 1,000 mL (07/28/22 1710)    LOS: 5 days   Marguerita Merles, DO [facility] Available via Epic secure chat 7am-7pm After these hours, please refer to coverage provider listed on [email or web address] 07/30/2022, 7:09 PM

## 2022-07-30 NOTE — Progress Notes (Addendum)
STROKE TEAM PROGRESS NOTE   SUBJECTIVE (INTERVAL HISTORY)  Evaluated patient at bedside, no family present. She remains very lethargic and with global aphasia. Opens eyes to verbal stimuli. She does track on the left side but does not cross past midline to right. Spontaneously moves LUE and LLE, withdraws to noxious stimuli in RLE. No response in RUE.   OBJECTIVE Temp:  [98.2 F (36.8 C)-101.2 F (38.4 C)] 99.5 F (37.5 C) (05/16 1112) Pulse Rate:  [89-96] 93 (05/16 1112) Cardiac Rhythm: Normal sinus rhythm (05/16 0700) Resp:  [17-20] 18 (05/16 1112) BP: (136-164)/(75-89) 142/80 (05/16 1112) SpO2:  [95 %-98 %] 96 % (05/16 1112)  Recent Labs  Lab 07/29/22 1634 07/29/22 1938 07/29/22 2133 07/29/22 2358 07/30/22 0324  GLUCAP 244* 240* 204* 180* 220*   Recent Labs  Lab 07/26/22 0255 07/27/22 0415 07/27/22 1752 07/28/22 0305 07/28/22 1811 07/29/22 0740 07/30/22 0429  NA 143 141  --  149*  --  151* 146*  K 3.3* 3.5  --  3.3*  --  4.2 3.9  CL 107 112*  --  117*  --  116* 111  CO2 26 23  --  23  --  27 26  GLUCOSE 133* 119*  --  117*  --  169* 193*  BUN 9 9  --  15  --  17 17  CREATININE 0.94 0.91  --  0.85  --  1.08* 1.09*  CALCIUM 9.3 9.1  --  8.2*  --  10.0 9.3  MG  --   --  2.4 2.2 2.7* 2.7* 2.6*  PHOS  --   --  3.7 2.6 2.6 3.3 3.8   Recent Labs  Lab 07/24/22 2035 07/29/22 0740 07/30/22 0429  AST 27 35 26  ALT 19 24 31   ALKPHOS 79 84 77  BILITOT 0.5 1.0 0.6  PROT 7.2 7.8 7.6  ALBUMIN 4.2 4.1 3.8   Recent Labs  Lab 07/24/22 2035 07/24/22 2040 07/26/22 0255 07/27/22 0415 07/28/22 0305 07/29/22 0740 07/30/22 0429  WBC 11.3*  --  10.6* 10.7* 9.4 11.7* 13.0*  NEUTROABS 8.6*  --   --   --   --   --  9.2*  HGB 13.3   < > 12.5 13.7 11.7* 14.1 14.1  HCT 42.5   < > 37.4 41.7 35.6* 43.9 43.6  MCV 90.0  --  84.2 84.8 85.6 85.4 86.7  PLT 268  --  256 241 227 260 248   < > = values in this interval not displayed.    Recent Labs    07/29/22 1110  COLORURINE  YELLOW  LABSPEC 1.013  PHURINE 6.0  GLUCOSEU NEGATIVE  HGBUR MODERATE*  BILIRUBINUR NEGATIVE  KETONESUR NEGATIVE  PROTEINUR 30*  NITRITE NEGATIVE  LEUKOCYTESUR MODERATE*       Component Value Date/Time   CHOL 164 07/25/2022 0623   CHOL 155 09/12/2021 1509   TRIG 52 07/25/2022 0623   HDL 49 07/25/2022 0623   HDL 46 09/12/2021 1509   CHOLHDL 3.3 07/25/2022 0623   VLDL 10 07/25/2022 0623   LDLCALC 105 (H) 07/25/2022 0623   LDLCALC 85 09/12/2021 1509   Lab Results  Component Value Date   HGBA1C 6.0 (H) 07/25/2022      Component Value Date/Time   LABOPIA NONE DETECTED 07/26/2022 1850   COCAINSCRNUR NONE DETECTED 07/26/2022 1850   LABBENZ NONE DETECTED 07/26/2022 1850   AMPHETMU NONE DETECTED 07/26/2022 1850   THCU NONE DETECTED 07/26/2022 1850   LABBARB NONE DETECTED  07/26/2022 1850    Recent Labs  Lab 07/24/22 2035  ETH <10    I have personally reviewed the radiological images below and agree with the radiology interpretations.  DG CHEST PORT 1 VIEW  Result Date: 07/29/2022 CLINICAL DATA:  New onset fevers. EXAM: PORTABLE CHEST 1 VIEW COMPARISON:  07/26/18 FINDINGS: There is a left arm PICC line with tip at the superior cavoatrial junction. Feeding tube courses below the level of the GE junction. Heart size appears normal. No pleural fluid or airspace disease. The visualized osseous structures are unremarkable. IMPRESSION: 1. No acute cardiopulmonary abnormalities. Electronically Signed   By: Signa Kell M.D.   On: 07/29/2022 13:58   DG Abd Portable 1V  Result Date: 07/27/2022 CLINICAL DATA:  Encounter for feeding tube placement. EXAM: PORTABLE ABDOMEN - 1 VIEW COMPARISON:  None. FINDINGS: Feeding tube tip projects over the distal stomach. The bowel gas pattern is normal. No radio-opaque calculi or other significant radiographic abnormality are seen. IMPRESSION: Feeding tube tip projects over the distal stomach. Electronically Signed   By: Orvan Falconer M.D.   On:  07/27/2022 14:56   ECHOCARDIOGRAM COMPLETE  Result Date: 07/27/2022    ECHOCARDIOGRAM REPORT   Patient Name:   AVLYN CLOUSER Deol Date of Exam: 07/27/2022 Medical Rec #:  [id number]        Height:       66.0 in Accession #:    [id number]       Weight:       192.2 lb Date of Birth:  Mar 09, 1961       BSA:          1.966 m Patient Age:    61 years         BP:           133/74 mmHg Patient Gender: F                HR:           79 bpm. Exam Location:  Inpatient Procedure: 2D Echo, Cardiac Doppler and Color Doppler Indications:    Stroke  History:        Patient has no prior history of Echocardiogram examinations.                 Sickel Cell Trait.  Sonographer:    Raeford Razor Referring Phys: [id number] [facility] KHALIQDINA IMPRESSIONS  1. Left ventricular ejection fraction, by estimation, is 55 to 60%. The left ventricle has normal function. The left ventricle has no regional wall motion abnormalities. Left ventricular diastolic parameters are consistent with Grade I diastolic dysfunction (impaired relaxation).  2. Right ventricular systolic function is normal. The right ventricular size is normal.  3. The mitral valve is normal in structure. No evidence of mitral valve regurgitation. No evidence of mitral stenosis.  4. The aortic valve is tricuspid. Aortic valve regurgitation is not visualized. No aortic stenosis is present.  5. The inferior vena cava is normal in size with greater than 50% respiratory variability, suggesting right atrial pressure of 3 mmHg. Comparison(s): No prior Echocardiogram. FINDINGS  Left Ventricle: Left ventricular ejection fraction, by estimation, is 55 to 60%. The left ventricle has normal function. The left ventricle has no regional wall motion abnormalities. The left ventricular internal cavity size was normal in size. There is  no left ventricular hypertrophy. Left ventricular diastolic parameters are consistent with Grade I diastolic dysfunction (impaired relaxation). Right Ventricle: The right  ventricular size is normal. Right ventricular systolic function is  normal. Left Atrium: Left atrial size was normal in size. Right Atrium: Right atrial size was normal in size. Pericardium: There is no evidence of pericardial effusion. Mitral Valve: The mitral valve is normal in structure. No evidence of mitral valve regurgitation. No evidence of mitral valve stenosis. Tricuspid Valve: The tricuspid valve is normal in structure. Tricuspid valve regurgitation is trivial. No evidence of tricuspid stenosis. Aortic Valve: The aortic valve is tricuspid. Aortic valve regurgitation is not visualized. No aortic stenosis is present. Aortic valve mean gradient measures 5.0 mmHg. Aortic valve peak gradient measures 9.7 mmHg. Aortic valve area, by VTI measures 2.48 cm. Pulmonic Valve: The pulmonic valve was not well visualized. Pulmonic valve regurgitation is not visualized. No evidence of pulmonic stenosis. Aorta: The aortic root is normal in size and structure. Venous: The inferior vena cava is normal in size with greater than 50% respiratory variability, suggesting right atrial pressure of 3 mmHg. IAS/Shunts: No atrial level shunt detected by color flow Doppler.  LEFT VENTRICLE PLAX 2D LVIDd:         3.90 cm   Diastology LVIDs:         3.00 cm   LV e' medial:    6.96 cm/s LV PW:         0.90 cm   LV E/e' medial:  12.8 LV IVS:        0.60 cm   LV e' lateral:   11.30 cm/s LVOT diam:     2.10 cm   LV E/e' lateral: 7.9 LV SV:         62 LV SV Index:   31 LVOT Area:     3.46 cm  RIGHT VENTRICLE             IVC RV Basal diam:  2.00 cm     IVC diam: 1.00 cm RV S prime:     20.10 cm/s TAPSE (M-mode): 2.5 cm LEFT ATRIUM             Index        RIGHT ATRIUM           Index LA diam:        3.20 cm 1.63 cm/m   RA Area:     11.40 cm LA Vol (A2C):   27.1 ml 13.78 ml/m  RA Volume:   24.10 ml  12.26 ml/m LA Vol (A4C):   40.0 ml 20.34 ml/m LA Biplane Vol: 36.8 ml 18.71 ml/m  AORTIC VALVE AV Area (Vmax):    2.53 cm AV Area (Vmean):    2.38 cm AV Area (VTI):     2.48 cm AV Vmax:           156.00 cm/s AV Vmean:          108.000 cm/s AV VTI:            0.249 m AV Peak Grad:      9.7 mmHg AV Mean Grad:      5.0 mmHg LVOT Vmax:         114.00 cm/s LVOT Vmean:        74.100 cm/s LVOT VTI:          0.178 m LVOT/AV VTI ratio: 0.71  AORTA Ao Root diam: 3.20 cm Ao Asc diam:  3.20 cm MITRAL VALVE                TRICUSPID VALVE MV Area (PHT): 5.79 cm     TR Peak grad:   23.6  mmHg MV E velocity: 88.80 cm/s   TR Vmax:        243.00 cm/s MV A velocity: 130.00 cm/s MV E/A ratio:  0.68         SHUNTS                             Systemic VTI:  0.18 m                             Systemic Diam: 2.10 cm Olga Millers MD Electronically signed by Olga Millers MD Signature Date/Time: 07/27/2022/12:38:46 PM    Final    CT HEAD WO CONTRAST ( )  Result Date: 07/27/2022 CLINICAL DATA:  Hemorrhagic stroke follow-up. EXAM: CT HEAD WITHOUT CONTRAST TECHNIQUE: Contiguous axial images were obtained from the base of the skull through the vertex without intravenous contrast. RADIATION DOSE REDUCTION: This exam was performed according to the departmental dose-optimization program which includes automated exposure control, adjustment of the mA and/or kV according to patient size and/or use of iterative reconstruction technique. COMPARISON:  Two days ago FINDINGS: Brain: Regional hemorrhage in edema facet patchy hemorrhage in regional edema in the left frontal lobe is unchanged with small volume subarachnoid hemorrhage descending the inter hemispheric fissure. Close proximity to the left lateral ventricle but no intraventricular extension. No hydrocephalus. Midline shift still measures 5 mm at the level of the septum pellucidum. Vascular: No hyperdense vessel or unexpected calcification. Skull: Normal. Negative for fracture or focal lesion. Sinuses/Orbits: No acute finding. IMPRESSION: Unchanged left frontal hemorrhage and edema with mild subarachnoid extension. No new or  progressive finding. Midline shift measures 5 mm. Electronically Signed   By: Tiburcio Pea M.D.   On: 07/27/2022 05:43   EEG adult  Result Date: 07/25/2022 Jefferson Fuel, MD     07/25/2022  8:18 PM Routine EEG Report Oradell Skogstad Vitolo is a 62 y.o. female with a history of intracranial hemorrhage and altered mental status who is undergoing an EEG to evaluate for seizures. Report: This EEG was acquired with electrodes placed according to the International 10-20 electrode system (including Fp1, Fp2, F3, F4, C3, C4, P3, P4, O1, O2, T3, T4, T5, T6, A1, A2, Fz, Cz, Pz). The following electrodes were missing or displaced: none. The occipital dominant rhythm was 7 Hz. This activity is reactive to stimulation. Drowsiness was manifested by background fragmentation; deeper stages of sleep were not identified. There was focal slowing over the left frontal region. There were no interictal epileptiform discharges. There were no electrographic seizures identified. Photic stimulation and hyperventilation were not performed. Impression and clinical correlation: This EEG was obtained while awake and drowsy and is abnormal due to mild diffuse slowing indicative of global cerebral dysfunction and left frontal slowing over the area of intracranial hemorrhage. Epileptiform abnormalities were not seen during this recording. Bing Neighbors, MD [facility] [phone] If 7pm- 7am, please page neurology on call as listed in AMION.   CT HEAD WO CONTRAST ( )  Result Date: 07/25/2022 CLINICAL DATA:  Intracranial hemorrhage follow up EXAM: CT HEAD WITHOUT CONTRAST TECHNIQUE: Contiguous axial images were obtained from the base of the skull through the vertex without intravenous contrast. RADIATION DOSE REDUCTION: This exam was performed according to the departmental dose-optimization program which includes automated exposure control, adjustment of the mA and/or kV according to patient size and/or use of iterative  reconstruction technique. COMPARISON:  07/25/2022 at 9:04 a.m.  FINDINGS: Brain: Compared to the most recent CT, the large mixed intra-axial and extra-axial hematoma at the left frontal lobe is unchanged. There is persistent rightward midline shift that measures 5 mm. Vascular: No hyperdense vessel or unexpected calcification. Skull: Normal. Negative for fracture or focal lesion. Sinuses/Orbits: No acute finding. Other: None. IMPRESSION: Unchanged large mixed intra- and extra-axial hematoma at the left frontal lobe with persistent rightward midline shift that measures 5 mm. Electronically Signed   By: Deatra Robinson M.D.   On: 07/25/2022 20:10   [place] EKG SITE RITE  Result Date: 07/25/2022 If Site Rite image not attached, placement could not be confirmed due to current cardiac rhythm.  CT HEAD WO CONTRAST  Result Date: 07/25/2022 CLINICAL DATA:  Stroke follow-up EXAM: CT HEAD WITHOUT CONTRAST TECHNIQUE: Contiguous axial images were obtained from the base of the skull through the vertex without intravenous contrast. RADIATION DOSE REDUCTION: This exam was performed according to the departmental dose-optimization program which includes automated exposure control, adjustment of the mA and/or kV according to patient size and/or use of iterative reconstruction technique. COMPARISON:  CT and CTA from earlier today FINDINGS: Brain: Superior left frontal/parasagittal hematoma with progression in the adjacent frontal white matter. Hemorrhage is seen in both the parenchymal and subarachnoid compartments with mild brain edema. The area of newly affected brain had a normal appearance on the prior brain MRI. The CTV adn MRI are again reviewed and the superior sagittal sinus is patent and there is no detectable cortical vein thrombus. The new area of hemorrhage measures 3.4 x 1.8 x 1.7 cm. Rightward midline shift measures 5 mm. No hydrocephalus. Vascular: No hyperdense vessel or unexpected calcification. Skull: Normal.  Negative for fracture or focal lesion. Sinuses/Orbits: No acute finding. Dr. Roda Shutters is aware of the findings at time of signing. IMPRESSION: Progression of left frontal ICH with new hematoma lateral to the presenting hematoma in the frontal lobe. Midline shift now measures 5 mm. Electronically Signed   By: Tiburcio Pea M.D.   On: 07/25/2022 09:37   CT VENOGRAM HEAD  Result Date: 07/25/2022 CLINICAL DATA:  ICH EXAM: CT VENOGRAM HEAD TECHNIQUE: Venographic phase images of the brain were obtained following the administration of intravenous contrast. Multiplanar reformats and maximum intensity projections were generated. RADIATION DOSE REDUCTION: This exam was performed according to the departmental dose-optimization program which includes automated exposure control, adjustment of the mA and/or kV according to patient size and/or use of iterative reconstruction technique. CONTRAST:  75mL OMNIPAQUE IOHEXOL 350 MG/ML SOLN COMPARISON:  Head CT and brain MRI from earlier today FINDINGS: No dural venous sinus thrombosis. The superior sagittal sinus is diffusely patent, including adjacent to the known left frontal hemorrhage. Deep and cortical veins also show no signs of thrombosis. The left transverse sigmoid outflow is dominant without stricture or other filling defect seen. No masslike enhancement at the hematoma. Follow-up after hemorrhage resolution may be useful in detecting compressed vascular lesion. IMPRESSION: Negative for dural venous sinus thrombosis. No vascular lesion seen underlying the hemorrhage. Electronically Signed   By: Tiburcio Pea M.D.   On: 07/25/2022 05:10   CT ANGIO HEAD NECK W WO CM  Result Date: 07/25/2022 CLINICAL DATA:  Hemorrhagic stroke. EXAM: CT ANGIOGRAPHY HEAD AND NECK WITH AND WITHOUT CONTRAST TECHNIQUE: Multidetector CT imaging of the head and neck was performed using the standard protocol during bolus administration of intravenous contrast. Multiplanar CT image reconstructions  and MIPs were obtained to evaluate the vascular anatomy. Carotid stenosis measurements (when applicable) are  obtained utilizing NASCET criteria, using the distal internal carotid diameter as the denominator. RADIATION DOSE REDUCTION: This exam was performed according to the departmental dose-optimization program which includes automated exposure control, adjustment of the mA and/or kV according to patient size and/or use of iterative reconstruction technique. CONTRAST:  75mL OMNIPAQUE IOHEXOL 350 MG/ML SOLN COMPARISON:  Head CT from earlier today FINDINGS: CTA NECK FINDINGS Aortic arch: 2 vessel branching. Right carotid system: Vessels are smoothly contoured and diffusely patent Left carotid system: Vessels are smoothly contoured and diffusely patent Vertebral arteries: No vertebral or subclavian stenosis or ulceration. Skeleton: Generalized cervical spine degeneration. Other neck: Negative Upper chest: Negative Review of the MIP images confirms the above findings CTA HEAD FINDINGS Anterior circulation: No spot sign, aneurysm, or signs of vascular malformation underlying the acute hemorrhage. Major vessels are widely patent and smoothly contoured. Posterior circulation: No significant stenosis, proximal occlusion, aneurysm, or vascular malformation. Venous sinuses: Separate CT venogram. No dural venous sinus thrombosis or early enhancement. Anatomic variants: None significant Review of the MIP images confirms the above findings IMPRESSION: No vascular lesion or spot sign seen at the left frontal hemorrhage. Electronically Signed   By: Tiburcio Pea M.D.   On: 07/25/2022 05:08   CT Head Wo Contrast  Result Date: 07/25/2022 CLINICAL DATA:  Intracranial hemorrhage EXAM: CT HEAD WITHOUT CONTRAST TECHNIQUE: Contiguous axial images were obtained from the base of the skull through the vertex without intravenous contrast. RADIATION DOSE REDUCTION: This exam was performed according to the departmental dose-optimization  program which includes automated exposure control, adjustment of the mA and/or kV according to patient size and/or use of iterative reconstruction technique. COMPARISON:  None Available. FINDINGS: Brain: Large acute intraparenchymal hematoma of the anterior superior left frontal lobe with overlying subarachnoid blood. Volume is approximately 18 mL. The hematoma is located within the left ACA territory. No midline shift or other mass effect. Vascular: No abnormal hyperdensity of the major intracranial arteries or dural venous sinuses. No intracranial atherosclerosis. Skull: The visualized skull base, calvarium and extracranial soft tissues are normal. Sinuses/Orbits: No fluid levels or advanced mucosal thickening of the visualized paranasal sinuses. No mastoid or middle ear effusion. The orbits are normal. IMPRESSION: 1. Large acute intraparenchymal hematoma of the anterior superior left frontal lobe with overlying subarachnoid blood. Volume is approximately 18 mL. 2. No midline shift or other mass effect. Electronically Signed   By: Deatra Robinson M.D.   On: 07/25/2022 03:29   MR BRAIN WO CONTRAST  Result Date: 07/25/2022 CLINICAL DATA:  Altered mental status EXAM: MRI HEAD WITHOUT CONTRAST TECHNIQUE: Multiplanar, multiecho pulse sequences of the brain and surrounding structures were obtained without intravenous contrast. COMPARISON:  None Available. FINDINGS: Brain: There is a large area of hemorrhage (approximately 7.0 x 2.0 x 2.5 cm) in the left frontal lobe, within the anterior cerebral artery territory. Mild surrounding edema. No visible acute ischemia, though susceptibility effects from the left frontal blood cause artifacts on the diffusion-weighted imaging. Normal white matter signal, parenchymal volume and CSF spaces. The midline structures are normal. Vascular: Major flow voids are preserved. Skull and upper cervical spine: Normal calvarium and skull base. Visualized upper cervical spine and soft  tissues are normal. Sinuses/Orbits:No paranasal sinus fluid levels or advanced mucosal thickening. No mastoid or middle ear effusion. Normal orbits. IMPRESSION: Large area of hemorrhage (approximately 18 mL) in the left frontal lobe, within the anterior cerebral artery territory. The distribution raises suspicion for hemorrhagic conversion and ACA territory infarct. Head CT is recommended.  Critical Value/emergent results were called by telephone at the time of interpretation on 07/25/2022 at 2:47 am to Dr. Preston Fleeting, who verbally acknowledged these results. Electronically Signed   By: Deatra Robinson M.D.   On: 07/25/2022 02:47     PHYSICAL EXAM  Temp:  [98.2 F (36.8 C)-101.2 F (38.4 C)] 99.5 F (37.5 C) (05/16 1112) Pulse Rate:  [89-96] 93 (05/16 1112) Resp:  [17-20] 18 (05/16 1112) BP: (136-164)/(75-89) 142/80 (05/16 1112) SpO2:  [95 %-98 %] 96 % (05/16 1112)  General: well-nourished, well-developed elderly female, sitting in bed, NAD. CV: normal rate and regular rhythm, no m/r/g. Pulm: normal WOB on RA. Skin: warm and dry. Neuro: opens eyes to verbal stimuli. Global aphasia, does not follow commands. PERRL with left gaze preference. Tracks on left side but pupils do not cross midline to right. R facial droop present. Spontaneously moves LUE and LLE. Withdraws RLE to noxious stimuli but RUE with no response.    ASSESSMENT/PLAN Ms. Lamyra Josephson Licea is a 62 y.o. female with history of sickle cell trait, diverticulosis, asthma admitted for speech difficulty. No tPA given due to ICH.    ICH: Initial left frontal ICH with left ACA distribution, concerning for hemorrhagic transformation.  Then new left frontal ICH lateral to previous hematoma, etiology unclear MRI brain left frontal ICH, within the ACA territory. CT head left frontal ICH along left ACA territory with overlying SAH.  Volume is approximately 18 ml CT head and neck unremarkable CTV no venous thrombosis 2D Echo LVEF 55-60%, no RWMA,  normal LA size, no interatrial shunt detected. EEG no seizure LDL 105 HgbA1c 6.0 UDS negative Subcutaneous heparin for VTE prophylaxis No antithrombotic prior to admission, now on No antithrombotic due to ICH. Therapy recommendations: CIR Disposition: Pending  Cerebral edema CT repeat 5/11 8 AM progression of left frontal ICH with new hematoma lateral to the presenting hematoma in the frontal lobe, midline shift 5 mm CT repeat 5/11 unchanged mixed intra and extra-axial hematoma at left frontal lobe with persistent rightward midline shift and 5 mm Repeat CT 5/13 with unchanged L frontal hemorrhage and edema with mild subarachnoid extension, no new or progressive finding.  Na 143->141->149->151 -> 146 On TF, monitor BMP  BP management  Stable CT repeat stable 5/13 BP goal now < 160 Long term BP goal normotensive  Hyperlipidemia Home meds: None LDL 105, goal < 70 Crestor 20 added Continue statin at discharge  Dysphagia Did not pass swallow N.p.o. for now Cortrak placed Now on tube feeding and FW  Fever and leukocytosis Tmax 100.4->101.2 WBC 11.3-> 10.6-> 10.7->9.4-> 11.7 -> 13.0 Lactic acid 3.2 Blood and urine culture pending UA WBC 11-20 Management per primary team Consider empiric Abx treatment if appropriate  Other Stroke Risk Factors Obesity, Body mass index is 31.14 kg/m.   Other Active Problems Sickle cell trait Diverticulosis AKI Cre 0.85->1.08->1.09  Hospital day # 5  Merrilyn Puma, MD [facility], PGY-3 07/30/2022, 2:52 PM  ATTENDING NOTE: I reviewed above note and agree with the assessment and plan. Pt was seen and examined.   No family at the bedside. Pt lying in bed, still lethargic and sleepy but arousable on voice and pain stimulation. Still has intermittent fever, leukocytosis, blood and urine culture pending. May consider Abx if needed. Management per primary team. Na 146, on TF and FW. PT and OT recommend CIR.   For detailed assessment  and plan, please refer to above/below as I have made changes wherever appropriate.  Marvel Plan, MD PhD Stroke Neurology 07/30/2022 9:02 PM    To contact Stroke Continuity provider, please refer to [email or web address]. After hours, contact General Neurology

## 2022-07-31 DIAGNOSIS — R509 Fever, unspecified: Secondary | ICD-10-CM | POA: Diagnosis not present

## 2022-07-31 DIAGNOSIS — N390 Urinary tract infection, site not specified: Secondary | ICD-10-CM

## 2022-07-31 DIAGNOSIS — B9620 Unspecified Escherichia coli [E. coli] as the cause of diseases classified elsewhere: Secondary | ICD-10-CM

## 2022-07-31 DIAGNOSIS — E870 Hyperosmolality and hypernatremia: Secondary | ICD-10-CM | POA: Diagnosis not present

## 2022-07-31 DIAGNOSIS — I611 Nontraumatic intracerebral hemorrhage in hemisphere, cortical: Secondary | ICD-10-CM | POA: Diagnosis not present

## 2022-07-31 LAB — GASTROINTESTINAL PANEL BY PCR, STOOL (REPLACES STOOL CULTURE)

## 2022-07-31 LAB — CBC WITH DIFFERENTIAL/PLATELET
Abs Immature Granulocytes: 0.04 10*3/uL (ref 0.00–0.07)
Basophils Absolute: 0.1 10*3/uL (ref 0.0–0.1)
Basophils Relative: 1 %
Eosinophils Absolute: 0.1 10*3/uL (ref 0.0–0.5)
Eosinophils Relative: 1 %
HCT: 44 % (ref 36.0–46.0)
Hemoglobin: 14 g/dL (ref 12.0–15.0)
Immature Granulocytes: 0 %
Lymphocytes Relative: 18 %
Lymphs Abs: 1.9 10*3/uL (ref 0.7–4.0)
MCH: 28.1 pg (ref 26.0–34.0)
MCHC: 31.8 g/dL (ref 30.0–36.0)
MCV: 88.2 fL (ref 80.0–100.0)
Monocytes Absolute: 1.1 10*3/uL — ABNORMAL HIGH (ref 0.1–1.0)
Monocytes Relative: 10 %
Neutro Abs: 7.6 10*3/uL (ref 1.7–7.7)
Neutrophils Relative %: 70 %
Platelets: 248 10*3/uL (ref 150–400)
RBC: 4.99 MIL/uL (ref 3.87–5.11)
RDW: 15.5 % (ref 11.5–15.5)
WBC: 10.9 10*3/uL — ABNORMAL HIGH (ref 4.0–10.5)
nRBC: 0 % (ref 0.0–0.2)

## 2022-07-31 LAB — COMPREHENSIVE METABOLIC PANEL
ALT: 63 U/L — ABNORMAL HIGH (ref 0–44)
AST: 45 U/L — ABNORMAL HIGH (ref 15–41)
Albumin: 3.6 g/dL (ref 3.5–5.0)
Alkaline Phosphatase: 76 U/L (ref 38–126)
Anion gap: 11 (ref 5–15)
BUN: 30 mg/dL — ABNORMAL HIGH (ref 8–23)
CO2: 26 mmol/L (ref 22–32)
Calcium: 9.2 mg/dL (ref 8.9–10.3)
Chloride: 111 mmol/L (ref 98–111)
Creatinine, Ser: 1.18 mg/dL — ABNORMAL HIGH (ref 0.44–1.00)
GFR, Estimated: 52 mL/min — ABNORMAL LOW (ref >60)
Glucose, Bld: 178 mg/dL — ABNORMAL HIGH (ref 70–99)
Potassium: 4.3 mmol/L (ref 3.5–5.1)
Sodium: 148 mmol/L — ABNORMAL HIGH (ref 135–145)
Total Bilirubin: 0.4 mg/dL (ref 0.3–1.2)
Total Protein: 7.3 g/dL (ref 6.5–8.1)

## 2022-07-31 LAB — GLUCOSE, CAPILLARY
Glucose-Capillary: 174 mg/dL — ABNORMAL HIGH (ref 70–99)
Glucose-Capillary: 177 mg/dL — ABNORMAL HIGH (ref 70–99)
Glucose-Capillary: 180 mg/dL — ABNORMAL HIGH (ref 70–99)
Glucose-Capillary: 195 mg/dL — ABNORMAL HIGH (ref 70–99)
Glucose-Capillary: 199 mg/dL — ABNORMAL HIGH (ref 70–99)
Glucose-Capillary: 210 mg/dL — ABNORMAL HIGH (ref 70–99)
Glucose-Capillary: 213 mg/dL — ABNORMAL HIGH (ref 70–99)

## 2022-07-31 LAB — PHOSPHORUS: Phosphorus: 4.3 mg/dL (ref 2.5–4.6)

## 2022-07-31 LAB — MAGNESIUM: Magnesium: 2.8 mg/dL — ABNORMAL HIGH (ref 1.7–2.4)

## 2022-07-31 LAB — CULTURE, BLOOD (ROUTINE X 2)

## 2022-07-31 MED ORDER — SODIUM CHLORIDE 0.9 % IV SOLN
1.0000 g | INTRAVENOUS | Status: AC
  Administered 2022-07-31 – 2022-08-06 (×7): 1 g via INTRAVENOUS
  Filled 2022-07-31 (×7): qty 10

## 2022-07-31 MED ORDER — INSULIN ASPART 100 UNIT/ML IJ SOLN
0.0000 [IU] | INTRAMUSCULAR | Status: DC
  Administered 2022-07-31: 2 [IU] via SUBCUTANEOUS
  Administered 2022-07-31: 3 [IU] via SUBCUTANEOUS
  Administered 2022-07-31 – 2022-08-01 (×2): 2 [IU] via SUBCUTANEOUS
  Administered 2022-08-01 (×2): 3 [IU] via SUBCUTANEOUS
  Administered 2022-08-01: 1 [IU] via SUBCUTANEOUS
  Administered 2022-08-01: 3 [IU] via SUBCUTANEOUS
  Administered 2022-08-01: 2 [IU] via SUBCUTANEOUS
  Administered 2022-08-02: 1 [IU] via SUBCUTANEOUS
  Administered 2022-08-02 – 2022-08-03 (×6): 3 [IU] via SUBCUTANEOUS
  Administered 2022-08-03: 2 [IU] via SUBCUTANEOUS
  Administered 2022-08-03: 3 [IU] via SUBCUTANEOUS
  Administered 2022-08-03 (×3): 2 [IU] via SUBCUTANEOUS
  Administered 2022-08-04: 3 [IU] via SUBCUTANEOUS
  Administered 2022-08-04: 1 [IU] via SUBCUTANEOUS
  Administered 2022-08-04 (×2): 2 [IU] via SUBCUTANEOUS
  Administered 2022-08-04: 1 [IU] via SUBCUTANEOUS
  Administered 2022-08-05: 3 [IU] via SUBCUTANEOUS
  Administered 2022-08-05: 1 [IU] via SUBCUTANEOUS
  Administered 2022-08-05: 3 [IU] via SUBCUTANEOUS
  Administered 2022-08-05: 1 [IU] via SUBCUTANEOUS
  Administered 2022-08-06: 2 [IU] via SUBCUTANEOUS
  Administered 2022-08-06 – 2022-08-07 (×4): 1 [IU] via SUBCUTANEOUS
  Administered 2022-08-07: 2 [IU] via SUBCUTANEOUS
  Administered 2022-08-07: 1 [IU] via SUBCUTANEOUS
  Administered 2022-08-07: 2 [IU] via SUBCUTANEOUS
  Administered 2022-08-08: 1 [IU] via SUBCUTANEOUS
  Administered 2022-08-08: 2 [IU] via SUBCUTANEOUS
  Administered 2022-08-08 – 2022-08-10 (×7): 1 [IU] via SUBCUTANEOUS
  Administered 2022-08-10 (×2): 2 [IU] via SUBCUTANEOUS
  Administered 2022-08-10 – 2022-08-11 (×2): 1 [IU] via SUBCUTANEOUS
  Administered 2022-08-11: 2 [IU] via SUBCUTANEOUS
  Administered 2022-08-11: 3 [IU] via SUBCUTANEOUS
  Administered 2022-08-11 (×2): 1 [IU] via SUBCUTANEOUS
  Administered 2022-08-11: 2 [IU] via SUBCUTANEOUS
  Administered 2022-08-12 (×2): 1 [IU] via SUBCUTANEOUS
  Administered 2022-08-12 (×2): 2 [IU] via SUBCUTANEOUS
  Administered 2022-08-12 – 2022-08-13 (×5): 1 [IU] via SUBCUTANEOUS
  Administered 2022-08-13: 3 [IU] via SUBCUTANEOUS
  Administered 2022-08-14 (×2): 1 [IU] via SUBCUTANEOUS
  Administered 2022-08-14: 2 [IU] via SUBCUTANEOUS
  Administered 2022-08-14 – 2022-08-15 (×3): 1 [IU] via SUBCUTANEOUS
  Administered 2022-08-15 (×2): 2 [IU] via SUBCUTANEOUS
  Administered 2022-08-15 – 2022-08-16 (×4): 1 [IU] via SUBCUTANEOUS
  Administered 2022-08-16 – 2022-08-17 (×4): 2 [IU] via SUBCUTANEOUS
  Administered 2022-08-17: 1 [IU] via SUBCUTANEOUS
  Administered 2022-08-17 (×2): 2 [IU] via SUBCUTANEOUS
  Administered 2022-08-17: 1 [IU] via SUBCUTANEOUS
  Administered 2022-08-17 – 2022-08-18 (×2): 2 [IU] via SUBCUTANEOUS
  Administered 2022-08-18 (×2): 1 [IU] via SUBCUTANEOUS
  Administered 2022-08-18: 2 [IU] via SUBCUTANEOUS

## 2022-07-31 MED ORDER — FREE WATER
200.0000 mL | Status: DC
  Administered 2022-07-31 – 2022-08-18 (×96): 200 mL

## 2022-07-31 NOTE — Progress Notes (Signed)
Physical Therapy Treatment Patient Details Name: Anna Berry MRN: [id number] DOB: 10/31/1960 Today's Date: 07/31/2022   History of Present Illness Pt is a 62 y.o. female who presented 07/25/22 with speech difficulties. MRI showed a L frontal ICH. Worsening neuro decline noted 5/11 after admitting pt. Repeat imaging showed new area of ICH lateral to current ICH with MLS 5 mm. PMH: Sickle cell trait, sigmoid diverticulosis, asthma, OA, obesity, pre-diabetes    PT Comments    Pt tolerates treatment well, although continues to demonstrate significant expressive aphasia and remains flaccid on R side. Pt remains max-totalA for all mobility, with tendency to push to right side intermittently during session. In standing pt demonstrates a strong R lateral lean which PT is unable to correct. Pt will benefit from continued PT services in an effort to improve mobility quality and to reduce falls risk. PT continues to recommend high intensity inpatient PT services at the time of discharge.   Recommendations for follow up therapy are one component of a multi-disciplinary discharge planning process, led by the attending physician.  Recommendations may be updated based on patient status, additional functional criteria and insurance authorization.  Follow Up Recommendations       Assistance Recommended at Discharge Frequent or constant Supervision/Assistance  Patient can return home with the following Two people to help with walking and/or transfers;Two people to help with bathing/dressing/bathroom;Assistance with feeding;Assistance with cooking/housework;Direct supervision/assist for medications management;Direct supervision/assist for financial management;Assist for transportation;Help with stairs or ramp for entrance   Equipment Recommendations  Hospital bed;Other (comment) (hoyer lift, tilt in space wheelchair)    Recommendations for Other Services       Precautions / Restrictions  Precautions Precautions: Fall;Other (comment) Precaution Comments: L wrist restraint; cortak; SBP <160 goal; pushing syndrome Restrictions Weight Bearing Restrictions: No     Mobility  Bed Mobility Overal bed mobility: Needs Assistance Bed Mobility: Supine to Sit, Sit to Supine     Supine to sit: Max assist, HOB elevated Sit to supine: Total assist, HOB elevated        Transfers Overall transfer level: Needs assistance Equipment used: 1 Schubach hand held assist Transfers: Sit to/from Stand Sit to Stand: Total assist           General transfer comment: pt stands twice with assist from PT, however pushes to right side in standing with strong right lateral lean. Pt has poor awareness of this lean, fighting PTs attempts to correct    Ambulation/Gait                   Stairs             Wheelchair Mobility    Modified Rankin (Stroke Patients Only)       Balance Overall balance assessment: Needs assistance Sitting-balance support: No upper extremity supported, Feet supported Sitting balance-Leahy Scale: Poor Sitting balance - Comments: pt initially pushing to right side in sitting, PT is able to facilitate a lean onto left elbow, pt then pushes into upright but does not continue to push all the way over to right side Postural control: Right lateral lean Standing balance support: Bilateral upper extremity supported Standing balance-Leahy Scale: Zero Standing balance comment: max-totalA, strong R lean                            Cognition Arousal/Alertness: Lethargic (awakens with stimulation) Behavior During Therapy: Flat affect Overall Cognitive Status: Difficult to assess Area of Impairment: Following  commands                       Following Commands: Follows one step commands inconsistently       General Comments: pt does follow some commands with left side, wiggling toes and squeezing fingers. Pt also tracks to left side  on command twice during session        Exercises      General Comments General comments (skin integrity, edema, etc.): VSS on RA      Pertinent Vitals/Pain Pain Assessment Pain Assessment: CPOT Facial Expression: Relaxed, neutral Body Movements: Absence of movements Muscle Tension: Relaxed Compliance with ventilator (intubated pts.): N/A Vocalization (extubated pts.): N/A CPOT Total: 0    Home Living                          Prior Function            PT Goals (current goals can now be found in the care plan section) Acute Rehab PT Goals Patient Stated Goal: did not state; family hopes for pt to improve Progress towards PT goals: Not progressing toward goals - comment    Frequency    Min 4X/week      PT Plan Current plan remains appropriate    Co-evaluation              AM-PAC PT "6 Clicks" Mobility   Outcome Measure  Help needed turning from your back to your side while in a flat bed without using bedrails?: Total Help needed moving from lying on your back to sitting on the side of a flat bed without using bedrails?: A Lot Help needed moving to and from a bed to a chair (including a wheelchair)?: Total Help needed standing up from a chair using your arms (e.g., wheelchair or bedside chair)?: Total Help needed to walk in hospital room?: Total Help needed climbing 3-5 steps with a railing? : Total 6 Click Score: 7    End of Session Equipment Utilized During Treatment: Gait belt Activity Tolerance: Patient tolerated treatment well Patient left: in bed;with call bell/phone within reach;with bed alarm set;with restraints reapplied Nurse Communication: Mobility status;Need for lift equipment PT Visit Diagnosis: Unsteadiness on feet (R26.81);Muscle weakness (generalized) (M62.81);Difficulty in walking, not elsewhere classified (R26.2);Other symptoms and signs involving the nervous system (R29.898);Hemiplegia and hemiparesis Hemiplegia -  Right/Left: Right Hemiplegia - dominant/non-dominant: Dominant Hemiplegia - caused by: Other Nontraumatic intracranial hemorrhage     Time: 1610-9604 PT Time Calculation (min) (ACUTE ONLY): 27 min  Charges:  $Therapeutic Activity: 23-37 mins                     Arlyss Gandy, PT, DPT Acute Rehabilitation Office [phone]    Arlyss Gandy 07/31/2022, 3:19 PM

## 2022-07-31 NOTE — TOC Progression Note (Signed)
Transition of Care [facility]) - Progression Note    Patient Details  Name: [place] Fawver Heyden MRN: [id number] Date of Birth: Apr 23, 1960  Transition of Care [facility]) CM/SW Contact  Kermit Balo, RN Phone Number: 07/31/2022, 10:25 AM  Clinical Narrative:    CM has verified with [facility] IR that they will need pt either on diet or with PEG before they will consider her for rehab.  TOC following and will fax her information to [facility] once long term nutrition decided on.     Expected Discharge Plan: IP Rehab Facility Barriers to Discharge: Continued Medical Work up  Expected Discharge Plan and Services   Discharge Planning Services: CM Consult Post Acute Care Choice: IP Rehab Living arrangements for the past 2 months: Single Family Home                                       Social Determinants of Health (SDOH) Interventions SDOH Screenings   Food Insecurity: Patient Unable To Answer (07/25/2022)  Housing: Low Risk  (07/25/2022)  Transportation Needs: Patient Unable To Answer (07/25/2022)  Utilities: Patient Unable To Answer (07/25/2022)  Depression (PHQ2-9): Low Risk  (03/27/2022)  Tobacco Use: Low Risk  (07/24/2022)    Readmission Risk Interventions     No data to display

## 2022-07-31 NOTE — Progress Notes (Signed)
PROGRESS NOTE    Anna Berry  NUU:[id number] DOB: Oct 18, 1960 DOA: 07/24/2022 PCP: Maury Dus, MD   Brief Narrative:  The patient is a 62 year old African-American female with past medical history significant for prolonged to sickle cell trait, sigmoid diverticulosis, asthma as well as osteoarthritis and other comorbidities who was brought to the hospital by EMS with inability to speak. Per report she drove from at 5 PM walked into her house and had multiple concerns. Family called EMS and she was brought in as a code stroke. She is able to tell admitting physician her name and age and current month her voice is very hypophonic. 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 She was transferred out of the intensive care unit to the tried hospitalist service on 07/29/2022.    The day that she was transferred to the [facility] service she spiked a temperature so further workup is being done.  Nursing states that she is starting have some diarrhea so we will check her for an infectious etiology.  Currently unclear etiology of her fevers.  Has a Cortrak in place and will continue for now and continue SLP.   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 distribution with concern for hemorrhagic transformation and then had a new left frontal ICH lateral to the previous hematoma with unclear etiology -MRI and repeat CT done -CTV showed no venous thrombosis -Echocardiogram done and showed a normal EF of 50 to 55% with no regional wall motion abnormalities and normal left atrial size -EEG done and showed no seizure -Hemoglobin A1c was 6.0 and LDL was 105 -Not on any antithrombotics due to ICH -He is on a statin now -PT OT recommending Inpatient Rehab but does not have insurance  coverage for inpatient rehab here and cannot go to rehab with Cortrak so will need to either be able to tolerate a diet or will require a PEG -Neurology following recommending allowing for the blood to be better absorbed and planning a repeat head CT in about 4 weeks and if this is stable and improved consider antiplatelet therapy at that time   Elevated blood pressure -Weaned off of Cleviprex -Given that her repeat CT scan on 530 was stable the neurologist physical extra blood pressure goal to 130s to 150s systolic and continues recommend long-term blood pressure goal being normotensive -Currently has labetalol 10 mg IV every 2 as needed as needed for SBP above 150 -Last BP was 116/85   Hyperlipidemia -Patient had a total cholesterol/HDL ratio of 3.3, cholesterol level 164, HDL of 49, LDL 105, triglycerides of 52, VLDL of 10 -Neurology is initiated the patient on a statin rosuvastatin 20 mg per tube daily   Dysphagia -Did not pass her swallow screen and remains n.p.o. for now -Speech therapy consulted and following for SLE and p.o. readiness -Has a small bore cortrak TF currently and SLP evaluating and recommending NPO status to be maintained and will pursue an MBS but deferred today due to Radiology Schedule   Hypernatremia -Na+ Trend: Recent Labs  Lab 07/24/22 2040 07/26/22 0255 07/27/22 0415 07/28/22 0305 07/29/22 0740 07/30/22 0429 07/31/22 1411  NA 140 143 141 149* 151* 146* 148*  -IVF now stopped -Water flushes have been initiated but may need to be increased given that sodium still  148 -Repeat CMP   Fever in the setting of UTI -Unclear Etiology -Spiked a Temperature of 100.4 yesterday and wet up to 101.2 -Pan Culture the Patient -WBC and Lactic Acid Trend: Recent Labs  Lab 07/24/22 2035 07/26/22 0255 07/27/22 0415 07/28/22 0305 07/29/22 0740 07/29/22 1204 07/30/22 0429 07/30/22 0849 07/30/22 1149 07/31/22 1411  WBC 11.3* 10.6* 10.7* 9.4 11.7*  --  13.0*  --    --  10.9*  LATICACIDVEN  --   --   --   --   --    < >  --    < > 1.4  --    < > = values in this interval not displayed.  -PCT Negative at <0.10 -Checking blood cultures x 2 and showing NGTD < 24 hours -Urinalysis done and showed a hazy appearance with moderate hemoglobin, moderate leukocytes, negative nitrites, many bacteria, 11-20 RBCs per high-power field, limited 20 WBCs and will send for urine culture before initiating antibiotics; Urine Cx showing >100,000 of E Coli  -Chest x-ray done and showed "There is a left arm PICC line with tip at the superior cavoatrial junction. Feeding tube courses below the level of the GE junction. Heart size appears normal. No pleural fluid or airspace disease. The visualized osseous structures are unremarkable." -IVF now to stopped  -GI Pathogen panel negative   Abnormal LFTS -In the setting of TF? -Mild and LFT Trend: Recent Labs  Lab 07/24/22 2035 07/29/22 0740 07/30/22 0429 07/31/22 1411  AST 27 35 26 45*  ALT 19 24 31  63*  -Continue to Monitor and Trend and repeat CMP in the AM    Lactic Acidosis -Lactic acid level trend: Recent Labs  Lab 07/29/22 1204 07/29/22 1506 07/30/22 0849 07/30/22 1149  LATICACIDVEN 2.3* 3.2* 1.5 1.4  -Started IV fluid hydration D5W and now stopped and on [employer] Flushes -Will not repeat now improved   GERD/GI prophylaxis -Continue with Pantoprazole 40 mg IV nightly   Obesity -Complicates overall prognosis and care -Estimated body mass index is 31.53 kg/m as calculated from the following:   Height as of this encounter: 5\' 6"  (1.676 m).   Weight as of this encounter: 88.6 kg.  -Weight Loss and Dietary Counseling given will be given once more awake  DVT prophylaxis: heparin injection 5,000 Units Start: 07/27/22 1300 SCD's Start: 07/25/22 0359    Code Status: Full Code Family Communication: No family present at bedside  Disposition Plan:  Level of care: Telemetry Medical Status is:  Inpatient Remains inpatient appropriate because: Further clinical improvement and will need to go to inpatient rehabilitation once issue about diet is addressed and will need to continue to treat and evaluate for fever   Consultants:  Neurology  Procedures:  As delineated as above  Antimicrobials:  Anti-infectives (From admission, onward)    Start     Dose/Rate Route Frequency Ordered Stop   07/31/22 1600  cefTRIAXone (ROCEPHIN) 1 g in sodium chloride 0.9 % 100 mL IVPB        1 g 200 mL/hr over 30 Minutes Intravenous Every 24 hours 07/31/22 1503         Subjective: Seen and examined at bedside and was more awake today but continues to have global aphasia and spontaneously moving left lower extremity.  Continues to be a little lethargic though.  Objective: Vitals:   07/31/22 0500 07/31/22 0803 07/31/22 1120 07/31/22 1523  BP:  119/83 136/87 116/85  Pulse:  90 98 97  Resp:  16 18  17  Temp:  98.6 F (37 C) 98.6 F (37 C) 98.9 F (37.2 C)  TempSrc:  Oral Oral Oral  SpO2:  96% 96% 97%  Weight: 88.6 kg     Height:        Intake/Output Summary (Last 24 hours) at 07/31/2022 1544 Last data filed at 07/31/2022 1035 Gross per 24 hour  Intake 20 ml  Output 850 ml  Net -830 ml   Filed Weights   07/28/22 0300 07/29/22 0436 07/31/22 0500  Weight: 85.5 kg 87.5 kg 88.6 kg   Examination: Physical Exam:  Constitutional: WN/WD obese African-American female who is more awake today but still does not interact and continues to have global aphasia Respiratory: Diminished to auscultation bilaterally with coarse breath sounds, no wheezing, rales, rhonchi or crackles. Normal respiratory effort and patient is not tachypenic. No accessory muscle use.  Unlabored breathing Cardiovascular: RRR, no murmurs / rubs / gallops. S1 and S2 auscultated. No extremity edema. Abdomen: Soft, non-tender, distended secondary to body habitus.  Bowel sounds positive.  GU: Deferred. Musculoskeletal: No  clubbing / cyanosis of digits/nails. No joint deformity upper and lower extremities. Skin: No rashes, lesions, ulcers on limited skin evaluation. No induration; Warm and dry.  Neurologic: Is more awake today and spontaneously moving left lower extremities however unable to move right extremities at all Psychiatric: Impaired judgment and insight  Data Reviewed: I have personally reviewed following labs and imaging studies  CBC: Recent Labs  Lab 07/24/22 2035 07/24/22 2040 07/27/22 0415 07/28/22 0305 07/29/22 0740 07/30/22 0429 07/31/22 1411  WBC 11.3*   < > 10.7* 9.4 11.7* 13.0* 10.9*  NEUTROABS 8.6*  --   --   --   --  9.2* 7.6  HGB 13.3   < > 13.7 11.7* 14.1 14.1 14.0  HCT 42.5   < > 41.7 35.6* 43.9 43.6 44.0  MCV 90.0   < > 84.8 85.6 85.4 86.7 88.2  PLT 268   < > 241 227 260 248 248   < > = values in this interval not displayed.   Basic Metabolic Panel: Recent Labs  Lab 07/27/22 0415 07/27/22 1752 07/28/22 0305 07/28/22 1811 07/29/22 0740 07/30/22 0429 07/31/22 1411  NA 141  --  149*  --  151* 146* 148*  K 3.5  --  3.3*  --  4.2 3.9 4.3  CL 112*  --  117*  --  116* 111 111  CO2 23  --  23  --  27 26 26   GLUCOSE 119*  --  117*  --  169* 193* 178*  BUN 9  --  15  --  17 17 30*  CREATININE 0.91  --  0.85  --  1.08* 1.09* 1.18*  CALCIUM 9.1  --  8.2*  --  10.0 9.3 9.2  MG  --    < > 2.2 2.7* 2.7* 2.6* 2.8*  PHOS  --    < > 2.6 2.6 3.3 3.8 4.3   < > = values in this interval not displayed.   GFR: Estimated Creatinine Clearance: 54.7 mL/min (A) (by C-G formula based on SCr of 1.18 mg/dL (H)). Liver Function Tests: Recent Labs  Lab 07/24/22 2035 07/29/22 0740 07/30/22 0429 07/31/22 1411  AST 27 35 26 45*  ALT 19 24 31  63*  ALKPHOS 79 84 77 76  BILITOT 0.5 1.0 0.6 0.4  PROT 7.2 7.8 7.6 7.3  ALBUMIN 4.2 4.1 3.8 3.6   No results for input(s): "LIPASE", "AMYLASE" in  the last 168 hours. No results for input(s): "AMMONIA" in the last 168 hours. Coagulation  Profile: Recent Labs  Lab 07/24/22 2035  INR 1.1   Cardiac Enzymes: No results for input(s): "CKTOTAL", "CKMB", "CKMBINDEX", "TROPONINI" in the last 168 hours. BNP (last 3 results) No results for input(s): "PROBNP" in the last 8760 hours. HbA1C: No results for input(s): "HGBA1C" in the last 72 hours. CBG: Recent Labs  Lab 07/31/22 0011 07/31/22 0417 07/31/22 0829 07/31/22 1118 07/31/22 1523  GLUCAP 199* 213* 174* 195* 180*   Lipid Profile: No results for input(s): "CHOL", "HDL", "LDLCALC", "TRIG", "CHOLHDL", "LDLDIRECT" in the last 72 hours. Thyroid Function Tests: No results for input(s): "TSH", "T4TOTAL", "FREET4", "T3FREE", "THYROIDAB" in the last 72 hours. Anemia Panel: No results for input(s): "VITAMINB12", "FOLATE", "FERRITIN", "TIBC", "IRON", "RETICCTPCT" in the last 72 hours. Sepsis Labs: Recent Labs  Lab 07/29/22 0740 07/29/22 1204 07/29/22 1506 07/30/22 0849 07/30/22 1149  PROCALCITON <0.10  --   --   --   --   LATICACIDVEN  --  2.3* 3.2* 1.5 1.4   Recent Results (from the past 240 hour(s))  MRSA Next Gen by PCR, Nasal     Status: None   Collection Time: 07/25/22  5:46 AM   Specimen: Nasal Mucosa; Nasal Swab  Result Value Ref Range Status   MRSA by PCR Next Gen NOT DETECTED NOT DETECTED Final    Comment: (NOTE) The GeneXpert MRSA Assay (FDA approved for NASAL specimens only), is one component of a comprehensive MRSA colonization surveillance program. It is not intended to diagnose MRSA infection nor to guide or monitor treatment for MRSA infections. Test performance is not FDA approved in patients less than 60 years old. Performed at [facility], [address], [place], [place] [address]   Culture, blood (Routine X 2) w Reflex to ID Panel     Status: None (Preliminary result)   Collection Time: 07/29/22 12:04 PM   Specimen: BLOOD RIGHT HAND  Result Value Ref Range Status   Specimen Description BLOOD RIGHT HAND  Final   Special Requests    Final    BOTTLES DRAWN AEROBIC AND ANAEROBIC Blood Culture adequate volume   Culture   Final    NO GROWTH 2 DAYS Performed at [facility], [address], [place], [place] [address]    Report Status PENDING  Incomplete  Culture, blood (Routine X 2) w Reflex to ID Panel     Status: None (Preliminary result)   Collection Time: 07/29/22 12:04 PM   Specimen: BLOOD RIGHT ARM  Result Value Ref Range Status   Specimen Description BLOOD RIGHT ARM  Final   Special Requests   Final    BOTTLES DRAWN AEROBIC AND ANAEROBIC Blood Culture results may not be optimal due to an inadequate volume of blood received in culture bottles   Culture   Final    NO GROWTH 2 DAYS Performed at [facility], [address], [place], [place] [address]    Report Status PENDING  Incomplete  Urine Culture (for pregnant, neutropenic or urologic patients or patients with an indwelling urinary catheter)     Status: Abnormal (Preliminary result)   Collection Time: 07/29/22  6:08 PM   Specimen: Urine, Clean Catch  Result Value Ref Range Status   Specimen Description URINE, CLEAN CATCH  Final   Special Requests NONE  Final   Culture (A)  Final    >=100,000 COLONIES/mL ESCHERICHIA COLI SUSCEPTIBILITIES TO FOLLOW Performed at  [facility], [address] [place], [place] [address]    Report Status PENDING  Incomplete  Gastrointestinal Panel by PCR , Stool     Status: None   Collection Time: 07/30/22  8:34 AM   Specimen: Stool  Result Value Ref Range Status   Campylobacter species NOT DETECTED NOT DETECTED Final   Plesimonas shigelloides NOT DETECTED NOT DETECTED Final   Salmonella species NOT DETECTED NOT DETECTED Final   Yersinia enterocolitica NOT DETECTED NOT DETECTED Final   Vibrio species NOT DETECTED NOT DETECTED Final   Vibrio cholerae NOT DETECTED NOT DETECTED Final   Enteroaggregative E coli (EAEC) NOT DETECTED NOT DETECTED Final   Enteropathogenic E coli (EPEC) NOT DETECTED NOT DETECTED  Final   Enterotoxigenic E coli (ETEC) NOT DETECTED NOT DETECTED Final   Shiga like toxin producing E coli (STEC) NOT DETECTED NOT DETECTED Final   Shigella/Enteroinvasive E coli (EIEC) NOT DETECTED NOT DETECTED Final   Cryptosporidium NOT DETECTED NOT DETECTED Final   Cyclospora cayetanensis NOT DETECTED NOT DETECTED Final   Entamoeba histolytica NOT DETECTED NOT DETECTED Final   Giardia lamblia NOT DETECTED NOT DETECTED Final   Adenovirus F40/41 NOT DETECTED NOT DETECTED Final   Astrovirus NOT DETECTED NOT DETECTED Final   Norovirus GI/GII NOT DETECTED NOT DETECTED Final   Rotavirus A NOT DETECTED NOT DETECTED Final   Sapovirus (I, II, IV, and V) NOT DETECTED NOT DETECTED Final    Comment: Performed at [facility], [address], [place], [place] [address]    Radiology Studies: No results found.  Scheduled Meds:  Chlorhexidine Gluconate Cloth  6 each Topical Q0600   feeding supplement (PROSource TF20)  60 mL Per Tube Daily   free water  150 mL Per Tube Q4H   heparin injection (subcutaneous)  5,000 Units Subcutaneous Q8H   insulin aspart  0-9 Units Subcutaneous Q4H   mouth rinse  15 mL Mouth Rinse 4 times per day   pantoprazole (PROTONIX) IV  40 mg Intravenous QHS   polyethylene glycol  17 g Per Tube BID   rosuvastatin  20 mg Per Tube Daily   senna-docusate  1 tablet Per Tube BID   sodium chloride flush  10-40 mL Intracatheter Q12H   Continuous Infusions:  cefTRIAXone (ROCEPHIN)  IV 1 g (07/31/22 1539)   feeding supplement (JEVITY 1.5 CAL/FIBER) 1,000 mL (07/31/22 1401)    LOS: 6 days   Marguerita Merles, DO [facility] Available via Epic secure chat 7am-7pm After these hours, please refer to coverage provider listed on [email or web address] 07/31/2022, 3:44 PM

## 2022-07-31 NOTE — Progress Notes (Signed)
Speech Language Pathology Treatment: Dysphagia  Patient Details Name: Anna Berry Geil MRN: [id number] DOB: 1960/07/31 Today's Date: 07/31/2022 Time: 8295-6213 SLP Time Calculation (min) (ACUTE ONLY): 16 min  Assessment / Plan / Recommendation Clinical Impression  Pt was seen for treatment. She was notably more alert than when she was last seen and she more actively participated in the session. She followed some 1-step commands with intermittent tactile cues, but no verbal output was elicited. Pt tolerated puree, ice chips and thin liquids without throat clearing/coughing; however, vocal quality could not be assessed due to pt's absent vocalization/verbalization. Mild oral holding was noted, but prompts were not consistently necessary for swallowing. Mild oral residue was cleared with a liquid wash. Secondary swallows were observed with larger boluses of puree; pharyngeal residue is therefore suspected. Significant right-sided anterior spillage was noted with thin liquids via cup and pt was unable to suck from a straw despite prompts. It is recommended that the pt's NPO status be maintained, but she may have ice chips and thin water via spoon after oral care. A modified barium swallow study is recommended, but will likely have to be deferred today due to radiology's schedule. SLP will continue to follow pt.     HPI HPI: Pt is a 62 y.o. female who presented with difficulty speaking. MRI brain 5/11: Large area of hemorrhage (approximately 18 mL) in the left frontal lobe, within the anterior cerebral artery territory. Pt passed [facility] 5/11, but then had neuro changes and could not complete a repeat [facility] due to lethargy. PMH: sickle cell trait, sigmoid diverticulosis, asthma, OA; BSE completed on 07/26/22 with NPO recommendation. ST f/u for SLE and po readiness.      SLP Plan  Continue with current plan of care      Recommendations for follow up therapy are one component of a multi-disciplinary discharge  planning process, led by the attending physician.  Recommendations may be updated based on patient status, additional functional criteria and insurance authorization.    Recommendations  Diet recommendations: NPO (Pt may have ice chips and water via spoon after oral care if she is alert.) Medication Administration: Via alternative means                  Oral care QID;Staff/trained caregiver to provide oral care   Frequent or constant Supervision/Assistance Dysphagia, oropharyngeal phase (R13.12)     Continue with current plan of care    Anna Berry I. Anna Clock, MS, CCC-SLP Neuro Diagnostic Specialist  Acute Rehabilitation Services Office number: [phone]  Scheryl Marten  07/31/2022, 9:29 AM

## 2022-07-31 NOTE — Plan of Care (Signed)
  Problem: Intracerebral Hemorrhage Tissue Perfusion: Goal: Complications of Intracerebral Hemorrhage will be minimized Outcome: Progressing   Problem: Self-Care: Goal: Ability to participate in self-care as condition permits will improve Outcome: Progressing   Problem: Nutrition: Goal: Risk of aspiration will decrease Outcome: Progressing Goal: Dietary intake will improve Outcome: Progressing   Problem: Clinical Measurements: Goal: Ability to maintain clinical measurements within normal limits will improve Outcome: Progressing Goal: Will remain free from infection Outcome: Progressing Goal: Diagnostic test results will improve Outcome: Progressing Goal: Cardiovascular complication will be avoided Outcome: Progressing   Problem: Activity: Goal: Risk for activity intolerance will decrease Outcome: Progressing   Problem: Nutrition: Goal: Adequate nutrition will be maintained Outcome: Progressing   Problem: Coping: Goal: Level of anxiety will decrease Outcome: Progressing   Problem: Elimination: Goal: Will not experience complications related to bowel motility Outcome: Progressing Goal: Will not experience complications related to urinary retention Outcome: Progressing   Problem: Pain Managment: Goal: General experience of comfort will improve Outcome: Progressing   Problem: Safety: Goal: Ability to remain free from injury will improve Outcome: Progressing   Problem: Skin Integrity: Goal: Risk for impaired skin integrity will decrease Outcome: Progressing   Problem: Safety: Goal: Non-violent Restraint(s) Outcome: Progressing   Problem: Fluid Volume: Goal: Ability to maintain a balanced intake and output will improve Outcome: Progressing   Problem: Metabolic: Goal: Ability to maintain appropriate glucose levels will improve Outcome: Progressing   Problem: Nutritional: Goal: Maintenance of adequate nutrition will improve Outcome: Progressing Goal:  Progress toward achieving an optimal weight will improve Outcome: Progressing   Problem: Skin Integrity: Goal: Risk for impaired skin integrity will decrease Outcome: Progressing   Problem: Tissue Perfusion: Goal: Adequacy of tissue perfusion will improve Outcome: Progressing   Problem: Education: Goal: Knowledge of disease or condition will improve Outcome: Not Progressing Goal: Knowledge of secondary prevention will improve (MUST DOCUMENT ALL) Outcome: Not Progressing Goal: Knowledge of patient specific risk factors will improve Loraine Leriche N/A or DELETE if not current risk factor) Outcome: Not Progressing   Problem: Coping: Goal: Will verbalize positive feelings about self Outcome: Not Progressing Goal: Will identify appropriate support needs Outcome: Not Progressing   Problem: Health Behavior/Discharge Planning: Goal: Ability to manage health-related needs will improve Outcome: Not Progressing Goal: Goals will be collaboratively established with patient/family Outcome: Not Progressing   Problem: Self-Care: Goal: Verbalization of feelings and concerns over difficulty with self-care will improve Outcome: Not Progressing Goal: Ability to communicate needs accurately will improve Outcome: Not Progressing   Problem: Education: Goal: Knowledge of General Education information will improve Description: Including pain rating scale, medication(s)/side effects and non-pharmacologic comfort measures Outcome: Not Progressing   Problem: Health Behavior/Discharge Planning: Goal: Ability to manage health-related needs will improve Outcome: Not Progressing   Problem: Education: Goal: Ability to describe self-care measures that may prevent or decrease complications (Diabetes Survival Skills Education) will improve Outcome: Not Progressing   Problem: Coping: Goal: Ability to adjust to condition or change in health will improve Outcome: Not Progressing   Problem: Health  Behavior/Discharge Planning: Goal: Ability to identify and utilize available resources and services will improve Outcome: Not Progressing Goal: Ability to manage health-related needs will improve Outcome: Not Progressing

## 2022-07-31 NOTE — Progress Notes (Addendum)
STROKE TEAM PROGRESS NOTE   SUBJECTIVE (INTERVAL HISTORY)  Evaluated patient at bedside, no family present. Remains very lethargic and with global aphasia. Opens eyes to verbal stimuli. Does track on the left to command, but does not cross past midline to right. Moves LUE antigravity, attempting to pull cortrak. Moves LLE spontaneously and able to wiggle toes to command. Withdraws to noxious stimuli in RLE, but no response in RUE. Neurologically stable, slightly improved since yesterday.   OBJECTIVE Temp:  [98.6 F (37 C)-99.7 F (37.6 C)] 98.6 F (37 C) (05/17 1120) Pulse Rate:  [88-98] 98 (05/17 1120) Cardiac Rhythm: Normal sinus rhythm (05/17 0805) Resp:  [16-18] 18 (05/17 1120) BP: (119-146)/(77-87) 136/87 (05/17 1120) SpO2:  [94 %-96 %] 96 % (05/17 1120) Weight:  [88.6 kg] 88.6 kg (05/17 0500)  Recent Labs  Lab 07/30/22 2109 07/31/22 0011 07/31/22 0417 07/31/22 0829 07/31/22 1118  GLUCAP 184* 199* 213* 174* 195*   Recent Labs  Lab 07/26/22 0255 07/27/22 0415 07/27/22 1752 07/28/22 0305 07/28/22 1811 07/29/22 0740 07/30/22 0429  NA 143 141  --  149*  --  151* 146*  K 3.3* 3.5  --  3.3*  --  4.2 3.9  CL 107 112*  --  117*  --  116* 111  CO2 26 23  --  23  --  27 26  GLUCOSE 133* 119*  --  117*  --  169* 193*  BUN 9 9  --  15  --  17 17  CREATININE 0.94 0.91  --  0.85  --  1.08* 1.09*  CALCIUM 9.3 9.1  --  8.2*  --  10.0 9.3  MG  --   --  2.4 2.2 2.7* 2.7* 2.6*  PHOS  --   --  3.7 2.6 2.6 3.3 3.8   Recent Labs  Lab 07/24/22 2035 07/29/22 0740 07/30/22 0429  AST 27 35 26  ALT 19 24 31   ALKPHOS 79 84 77  BILITOT 0.5 1.0 0.6  PROT 7.2 7.8 7.6  ALBUMIN 4.2 4.1 3.8   Recent Labs  Lab 07/24/22 2035 07/24/22 2040 07/26/22 0255 07/27/22 0415 07/28/22 0305 07/29/22 0740 07/30/22 0429  WBC 11.3*  --  10.6* 10.7* 9.4 11.7* 13.0*  NEUTROABS 8.6*  --   --   --   --   --  9.2*  HGB 13.3   < > 12.5 13.7 11.7* 14.1 14.1  HCT 42.5   < > 37.4 41.7 35.6* 43.9  43.6  MCV 90.0  --  84.2 84.8 85.6 85.4 86.7  PLT 268  --  256 241 227 260 248   < > = values in this interval not displayed.    Recent Labs    07/29/22 1110  COLORURINE YELLOW  LABSPEC 1.013  PHURINE 6.0  GLUCOSEU NEGATIVE  HGBUR MODERATE*  BILIRUBINUR NEGATIVE  KETONESUR NEGATIVE  PROTEINUR 30*  NITRITE NEGATIVE  LEUKOCYTESUR MODERATE*       Component Value Date/Time   CHOL 164 07/25/2022 0623   CHOL 155 09/12/2021 1509   TRIG 52 07/25/2022 0623   HDL 49 07/25/2022 0623   HDL 46 09/12/2021 1509   CHOLHDL 3.3 07/25/2022 0623   VLDL 10 07/25/2022 0623   LDLCALC 105 (H) 07/25/2022 0623   LDLCALC 85 09/12/2021 1509   Lab Results  Component Value Date   HGBA1C 6.0 (H) 07/25/2022      Component Value Date/Time   LABOPIA NONE DETECTED 07/26/2022 1850   COCAINSCRNUR NONE DETECTED 07/26/2022 1850  LABBENZ NONE DETECTED 07/26/2022 1850   AMPHETMU NONE DETECTED 07/26/2022 1850   THCU NONE DETECTED 07/26/2022 1850   LABBARB NONE DETECTED 07/26/2022 1850    Recent Labs  Lab 07/24/22 2035  ETH <10    I have personally reviewed the radiological images below and agree with the radiology interpretations.  DG CHEST PORT 1 VIEW  Result Date: 07/29/2022 CLINICAL DATA:  New onset fevers. EXAM: PORTABLE CHEST 1 VIEW COMPARISON:  07/26/18 FINDINGS: There is a left arm PICC line with tip at the superior cavoatrial junction. Feeding tube courses below the level of the GE junction. Heart size appears normal. No pleural fluid or airspace disease. The visualized osseous structures are unremarkable. IMPRESSION: 1. No acute cardiopulmonary abnormalities. Electronically Signed   By: Signa Kell M.D.   On: 07/29/2022 13:58   DG Abd Portable 1V  Result Date: 07/27/2022 CLINICAL DATA:  Encounter for feeding tube placement. EXAM: PORTABLE ABDOMEN - 1 VIEW COMPARISON:  None. FINDINGS: Feeding tube tip projects over the distal stomach. The bowel gas pattern is normal. No radio-opaque  calculi or other significant radiographic abnormality are seen. IMPRESSION: Feeding tube tip projects over the distal stomach. Electronically Signed   By: Orvan Falconer M.D.   On: 07/27/2022 14:56   ECHOCARDIOGRAM COMPLETE  Result Date: 07/27/2022    ECHOCARDIOGRAM REPORT   Patient Name:   Anna Berry Date of Exam: 07/27/2022 Medical Rec #:  [id number]        Height:       66.0 in Accession #:    [id number]       Weight:       192.2 lb Date of Birth:  08/14/1960       BSA:          1.966 m Patient Age:    61 years         BP:           133/74 mmHg Patient Gender: F                HR:           79 bpm. Exam Location:  Inpatient Procedure: 2D Echo, Cardiac Doppler and Color Doppler Indications:    Stroke  History:        Patient has no prior history of Echocardiogram examinations.                 Sickel Cell Trait.  Sonographer:    Raeford Razor Referring Phys: [id number] [facility] KHALIQDINA IMPRESSIONS  1. Left ventricular ejection fraction, by estimation, is 55 to 60%. The left ventricle has normal function. The left ventricle has no regional wall motion abnormalities. Left ventricular diastolic parameters are consistent with Grade I diastolic dysfunction (impaired relaxation).  2. Right ventricular systolic function is normal. The right ventricular size is normal.  3. The mitral valve is normal in structure. No evidence of mitral valve regurgitation. No evidence of mitral stenosis.  4. The aortic valve is tricuspid. Aortic valve regurgitation is not visualized. No aortic stenosis is present.  5. The inferior vena cava is normal in size with greater than 50% respiratory variability, suggesting right atrial pressure of 3 mmHg. Comparison(s): No prior Echocardiogram. FINDINGS  Left Ventricle: Left ventricular ejection fraction, by estimation, is 55 to 60%. The left ventricle has normal function. The left ventricle has no regional wall motion abnormalities. The left ventricular internal cavity size was normal in size.  There is  no left ventricular hypertrophy. Left ventricular  diastolic parameters are consistent with Grade I diastolic dysfunction (impaired relaxation). Right Ventricle: The right ventricular size is normal. Right ventricular systolic function is normal. Left Atrium: Left atrial size was normal in size. Right Atrium: Right atrial size was normal in size. Pericardium: There is no evidence of pericardial effusion. Mitral Valve: The mitral valve is normal in structure. No evidence of mitral valve regurgitation. No evidence of mitral valve stenosis. Tricuspid Valve: The tricuspid valve is normal in structure. Tricuspid valve regurgitation is trivial. No evidence of tricuspid stenosis. Aortic Valve: The aortic valve is tricuspid. Aortic valve regurgitation is not visualized. No aortic stenosis is present. Aortic valve mean gradient measures 5.0 mmHg. Aortic valve peak gradient measures 9.7 mmHg. Aortic valve area, by VTI measures 2.48 cm. Pulmonic Valve: The pulmonic valve was not well visualized. Pulmonic valve regurgitation is not visualized. No evidence of pulmonic stenosis. Aorta: The aortic root is normal in size and structure. Venous: The inferior vena cava is normal in size with greater than 50% respiratory variability, suggesting right atrial pressure of 3 mmHg. IAS/Shunts: No atrial level shunt detected by color flow Doppler.  LEFT VENTRICLE PLAX 2D LVIDd:         3.90 cm   Diastology LVIDs:         3.00 cm   LV e' medial:    6.96 cm/s LV PW:         0.90 cm   LV E/e' medial:  12.8 LV IVS:        0.60 cm   LV e' lateral:   11.30 cm/s LVOT diam:     2.10 cm   LV E/e' lateral: 7.9 LV SV:         62 LV SV Index:   31 LVOT Area:     3.46 cm  RIGHT VENTRICLE             IVC RV Basal diam:  2.00 cm     IVC diam: 1.00 cm RV S prime:     20.10 cm/s TAPSE (M-mode): 2.5 cm LEFT ATRIUM             Index        RIGHT ATRIUM           Index LA diam:        3.20 cm 1.63 cm/m   RA Area:     11.40 cm LA Vol (A2C):   27.1  ml 13.78 ml/m  RA Volume:   24.10 ml  12.26 ml/m LA Vol (A4C):   40.0 ml 20.34 ml/m LA Biplane Vol: 36.8 ml 18.71 ml/m  AORTIC VALVE AV Area (Vmax):    2.53 cm AV Area (Vmean):   2.38 cm AV Area (VTI):     2.48 cm AV Vmax:           156.00 cm/s AV Vmean:          108.000 cm/s AV VTI:            0.249 m AV Peak Grad:      9.7 mmHg AV Mean Grad:      5.0 mmHg LVOT Vmax:         114.00 cm/s LVOT Vmean:        74.100 cm/s LVOT VTI:          0.178 m LVOT/AV VTI ratio: 0.71  AORTA Ao Root diam: 3.20 cm Ao Asc diam:  3.20 cm MITRAL VALVE  TRICUSPID VALVE MV Area (PHT): 5.79 cm     TR Peak grad:   23.6 mmHg MV E velocity: 88.80 cm/s   TR Vmax:        243.00 cm/s MV A velocity: 130.00 cm/s MV E/A ratio:  0.68         SHUNTS                             Systemic VTI:  0.18 m                             Systemic Diam: 2.10 cm Olga Millers MD Electronically signed by Olga Millers MD Signature Date/Time: 07/27/2022/12:38:46 PM    Final    CT HEAD WO CONTRAST ( )  Result Date: 07/27/2022 CLINICAL DATA:  Hemorrhagic stroke follow-up. EXAM: CT HEAD WITHOUT CONTRAST TECHNIQUE: Contiguous axial images were obtained from the base of the skull through the vertex without intravenous contrast. RADIATION DOSE REDUCTION: This exam was performed according to the departmental dose-optimization program which includes automated exposure control, adjustment of the mA and/or kV according to patient size and/or use of iterative reconstruction technique. COMPARISON:  Two days ago FINDINGS: Brain: Regional hemorrhage in edema facet patchy hemorrhage in regional edema in the left frontal lobe is unchanged with small volume subarachnoid hemorrhage descending the inter hemispheric fissure. Close proximity to the left lateral ventricle but no intraventricular extension. No hydrocephalus. Midline shift still measures 5 mm at the level of the septum pellucidum. Vascular: No hyperdense vessel or unexpected calcification.  Skull: Normal. Negative for fracture or focal lesion. Sinuses/Orbits: No acute finding. IMPRESSION: Unchanged left frontal hemorrhage and edema with mild subarachnoid extension. No new or progressive finding. Midline shift measures 5 mm. Electronically Signed   By: Tiburcio Pea M.D.   On: 07/27/2022 05:43   EEG adult  Result Date: 07/25/2022 Jefferson Fuel, MD     07/25/2022  8:18 PM Routine EEG Report Sharl Ahlquist Niehoff is a 62 y.o. female with a history of intracranial hemorrhage and altered mental status who is undergoing an EEG to evaluate for seizures. Report: This EEG was acquired with electrodes placed according to the International 10-20 electrode system (including Fp1, Fp2, F3, F4, C3, C4, P3, P4, O1, O2, T3, T4, T5, T6, A1, A2, Fz, Cz, Pz). The following electrodes were missing or displaced: none. The occipital dominant rhythm was 7 Hz. This activity is reactive to stimulation. Drowsiness was manifested by background fragmentation; deeper stages of sleep were not identified. There was focal slowing over the left frontal region. There were no interictal epileptiform discharges. There were no electrographic seizures identified. Photic stimulation and hyperventilation were not performed. Impression and clinical correlation: This EEG was obtained while awake and drowsy and is abnormal due to mild diffuse slowing indicative of global cerebral dysfunction and left frontal slowing over the area of intracranial hemorrhage. Epileptiform abnormalities were not seen during this recording. Bing Neighbors, MD [facility] [phone] If 7pm- 7am, please page neurology on call as listed in AMION.   CT HEAD WO CONTRAST ( )  Result Date: 07/25/2022 CLINICAL DATA:  Intracranial hemorrhage follow up EXAM: CT HEAD WITHOUT CONTRAST TECHNIQUE: Contiguous axial images were obtained from the base of the skull through the vertex without intravenous contrast. RADIATION DOSE REDUCTION: This exam was performed  according to the departmental dose-optimization program which includes automated exposure control, adjustment of the mA and/or  kV according to patient size and/or use of iterative reconstruction technique. COMPARISON:  07/25/2022 at 9:04 a.m. FINDINGS: Brain: Compared to the most recent CT, the large mixed intra-axial and extra-axial hematoma at the left frontal lobe is unchanged. There is persistent rightward midline shift that measures 5 mm. Vascular: No hyperdense vessel or unexpected calcification. Skull: Normal. Negative for fracture or focal lesion. Sinuses/Orbits: No acute finding. Other: None. IMPRESSION: Unchanged large mixed intra- and extra-axial hematoma at the left frontal lobe with persistent rightward midline shift that measures 5 mm. Electronically Signed   By: Deatra Robinson M.D.   On: 07/25/2022 20:10   [place] EKG SITE RITE  Result Date: 07/25/2022 If Site Rite image not attached, placement could not be confirmed due to current cardiac rhythm.  CT HEAD WO CONTRAST  Result Date: 07/25/2022 CLINICAL DATA:  Stroke follow-up EXAM: CT HEAD WITHOUT CONTRAST TECHNIQUE: Contiguous axial images were obtained from the base of the skull through the vertex without intravenous contrast. RADIATION DOSE REDUCTION: This exam was performed according to the departmental dose-optimization program which includes automated exposure control, adjustment of the mA and/or kV according to patient size and/or use of iterative reconstruction technique. COMPARISON:  CT and CTA from earlier today FINDINGS: Brain: Superior left frontal/parasagittal hematoma with progression in the adjacent frontal white matter. Hemorrhage is seen in both the parenchymal and subarachnoid compartments with mild brain edema. The area of newly affected brain had a normal appearance on the prior brain MRI. The CTV adn MRI are again reviewed and the superior sagittal sinus is patent and there is no detectable cortical vein thrombus. The new area of  hemorrhage measures 3.4 x 1.8 x 1.7 cm. Rightward midline shift measures 5 mm. No hydrocephalus. Vascular: No hyperdense vessel or unexpected calcification. Skull: Normal. Negative for fracture or focal lesion. Sinuses/Orbits: No acute finding. Dr. Roda Shutters is aware of the findings at time of signing. IMPRESSION: Progression of left frontal ICH with new hematoma lateral to the presenting hematoma in the frontal lobe. Midline shift now measures 5 mm. Electronically Signed   By: Tiburcio Pea M.D.   On: 07/25/2022 09:37   CT VENOGRAM HEAD  Result Date: 07/25/2022 CLINICAL DATA:  ICH EXAM: CT VENOGRAM HEAD TECHNIQUE: Venographic phase images of the brain were obtained following the administration of intravenous contrast. Multiplanar reformats and maximum intensity projections were generated. RADIATION DOSE REDUCTION: This exam was performed according to the departmental dose-optimization program which includes automated exposure control, adjustment of the mA and/or kV according to patient size and/or use of iterative reconstruction technique. CONTRAST:  75mL OMNIPAQUE IOHEXOL 350 MG/ML SOLN COMPARISON:  Head CT and brain MRI from earlier today FINDINGS: No dural venous sinus thrombosis. The superior sagittal sinus is diffusely patent, including adjacent to the known left frontal hemorrhage. Deep and cortical veins also show no signs of thrombosis. The left transverse sigmoid outflow is dominant without stricture or other filling defect seen. No masslike enhancement at the hematoma. Follow-up after hemorrhage resolution may be useful in detecting compressed vascular lesion. IMPRESSION: Negative for dural venous sinus thrombosis. No vascular lesion seen underlying the hemorrhage. Electronically Signed   By: Tiburcio Pea M.D.   On: 07/25/2022 05:10   CT ANGIO HEAD NECK W WO CM  Result Date: 07/25/2022 CLINICAL DATA:  Hemorrhagic stroke. EXAM: CT ANGIOGRAPHY HEAD AND NECK WITH AND WITHOUT CONTRAST TECHNIQUE:  Multidetector CT imaging of the head and neck was performed using the standard protocol during bolus administration of intravenous contrast. Multiplanar CT  image reconstructions and MIPs were obtained to evaluate the vascular anatomy. Carotid stenosis measurements (when applicable) are obtained utilizing NASCET criteria, using the distal internal carotid diameter as the denominator. RADIATION DOSE REDUCTION: This exam was performed according to the departmental dose-optimization program which includes automated exposure control, adjustment of the mA and/or kV according to patient size and/or use of iterative reconstruction technique. CONTRAST:  75mL OMNIPAQUE IOHEXOL 350 MG/ML SOLN COMPARISON:  Head CT from earlier today FINDINGS: CTA NECK FINDINGS Aortic arch: 2 vessel branching. Right carotid system: Vessels are smoothly contoured and diffusely patent Left carotid system: Vessels are smoothly contoured and diffusely patent Vertebral arteries: No vertebral or subclavian stenosis or ulceration. Skeleton: Generalized cervical spine degeneration. Other neck: Negative Upper chest: Negative Review of the MIP images confirms the above findings CTA HEAD FINDINGS Anterior circulation: No spot sign, aneurysm, or signs of vascular malformation underlying the acute hemorrhage. Major vessels are widely patent and smoothly contoured. Posterior circulation: No significant stenosis, proximal occlusion, aneurysm, or vascular malformation. Venous sinuses: Separate CT venogram. No dural venous sinus thrombosis or early enhancement. Anatomic variants: None significant Review of the MIP images confirms the above findings IMPRESSION: No vascular lesion or spot sign seen at the left frontal hemorrhage. Electronically Signed   By: Tiburcio Pea M.D.   On: 07/25/2022 05:08   CT Head Wo Contrast  Result Date: 07/25/2022 CLINICAL DATA:  Intracranial hemorrhage EXAM: CT HEAD WITHOUT CONTRAST TECHNIQUE: Contiguous axial images were  obtained from the base of the skull through the vertex without intravenous contrast. RADIATION DOSE REDUCTION: This exam was performed according to the departmental dose-optimization program which includes automated exposure control, adjustment of the mA and/or kV according to patient size and/or use of iterative reconstruction technique. COMPARISON:  None Available. FINDINGS: Brain: Large acute intraparenchymal hematoma of the anterior superior left frontal lobe with overlying subarachnoid blood. Volume is approximately 18 mL. The hematoma is located within the left ACA territory. No midline shift or other mass effect. Vascular: No abnormal hyperdensity of the major intracranial arteries or dural venous sinuses. No intracranial atherosclerosis. Skull: The visualized skull base, calvarium and extracranial soft tissues are normal. Sinuses/Orbits: No fluid levels or advanced mucosal thickening of the visualized paranasal sinuses. No mastoid or middle ear effusion. The orbits are normal. IMPRESSION: 1. Large acute intraparenchymal hematoma of the anterior superior left frontal lobe with overlying subarachnoid blood. Volume is approximately 18 mL. 2. No midline shift or other mass effect. Electronically Signed   By: Deatra Robinson M.D.   On: 07/25/2022 03:29   MR BRAIN WO CONTRAST  Result Date: 07/25/2022 CLINICAL DATA:  Altered mental status EXAM: MRI HEAD WITHOUT CONTRAST TECHNIQUE: Multiplanar, multiecho pulse sequences of the brain and surrounding structures were obtained without intravenous contrast. COMPARISON:  None Available. FINDINGS: Brain: There is a large area of hemorrhage (approximately 7.0 x 2.0 x 2.5 cm) in the left frontal lobe, within the anterior cerebral artery territory. Mild surrounding edema. No visible acute ischemia, though susceptibility effects from the left frontal blood cause artifacts on the diffusion-weighted imaging. Normal white matter signal, parenchymal volume and CSF spaces. The  midline structures are normal. Vascular: Major flow voids are preserved. Skull and upper cervical spine: Normal calvarium and skull base. Visualized upper cervical spine and soft tissues are normal. Sinuses/Orbits:No paranasal sinus fluid levels or advanced mucosal thickening. No mastoid or middle ear effusion. Normal orbits. IMPRESSION: Large area of hemorrhage (approximately 18 mL) in the left frontal lobe, within the anterior cerebral  artery territory. The distribution raises suspicion for hemorrhagic conversion and ACA territory infarct. Head CT is recommended. Critical Value/emergent results were called by telephone at the time of interpretation on 07/25/2022 at 2:47 am to Dr. Preston Fleeting, who verbally acknowledged these results. Electronically Signed   By: Deatra Robinson M.D.   On: 07/25/2022 02:47     PHYSICAL EXAM  Temp:  [98.6 F (37 C)-99.7 F (37.6 C)] 98.6 F (37 C) (05/17 1120) Pulse Rate:  [88-98] 98 (05/17 1120) Resp:  [16-18] 18 (05/17 1120) BP: (119-146)/(77-87) 136/87 (05/17 1120) SpO2:  [94 %-96 %] 96 % (05/17 1120) Weight:  [88.6 kg] 88.6 kg (05/17 0500)  General: well-nourished, well-developed elderly female, sitting in bed, NAD. CV: normal rate and regular rhythm, no m/r/g. Pulm: normal WOB on RA. Neuro: opens eyes to verbal stimuli. Global aphasia. PERRL with left gaze preference. Tracks consistently on left side but pupils do not cross midline to right. R facial droop present. Moves LUE antigravity. Spontaneously moves LLE and wiggles toes to command. Withdraws RLE to noxious stimuli but no response in RUE.   ASSESSMENT/PLAN Anna Berry is a 62 y.o. female with history of sickle cell trait, diverticulosis, asthma admitted for speech difficulty. No tPA given due to ICH.    ICH: Initial left frontal ICH with left ACA distribution, concerning for hemorrhagic transformation.  Then new left frontal ICH lateral to previous hematoma, etiology unclear MRI brain left frontal  ICH, within the ACA territory. CT head left frontal ICH along left ACA territory with overlying SAH.  Volume is approximately 18 ml CT head and neck unremarkable CTV no venous thrombosis 2D Echo LVEF 55-60%, no RWMA, normal LA size, no interatrial shunt detected. EEG no seizure LDL 105 HgbA1c 6.0 UDS negative Subcutaneous heparin for VTE prophylaxis No antithrombotic prior to admission, now on No antithrombotic due to ICH. Will plan to repeat CTA and MRI in 4-6 weeks as outpt to rule out mass lesion or vascular multiple patient once blood absorbed Therapy recommendations: CIR Disposition: pending  Cerebral edema CT repeat 5/11 8 AM progression of left frontal ICH with new hematoma lateral to the presenting hematoma in the frontal lobe, midline shift 5 mm CT repeat 5/11 unchanged mixed intra and extra-axial hematoma at left frontal lobe with persistent rightward midline shift and 5 mm Repeat CT 5/13 with unchanged L frontal hemorrhage and edema with mild subarachnoid extension, no new or progressive finding.  Na 143->141->149->151 -> 146->148 On TF   BP management  Stable CT repeat stable 5/13 BP goal now < 160 Long term BP goal normotensive  Hyperlipidemia Home meds: None LDL 105, goal < 70 Crestor 20 added Continue statin at discharge  Dysphagia Did not pass swallow N.p.o. for now Cortrak placed Now on tube feeding and FW  Fever and leukocytosis E coli UTI Tmax 100.4->101.2-> afebrile WBC 11.3-> 10.6-> 10.7->9.4-> 11.7 -> 13.0 -> 10.9 Lactic acid 3.2 Blood cultures no growth for past 2 days UA WBC 11-20, Urine cultures with E coli Management per primary team Now on Rocephin  Other Stroke Risk Factors Obesity, Body mass index is 31.53 kg/m.   Other Active Problems Sickle cell trait Diverticulosis AKI Cre 0.85->1.08->1.09-> 1.18  Hospital day # 6  She remains stable from a neurological perspective. Does appear to have E coli UTI, will defer to primary team  for management. Recommend continuing crestor at discharge. Recommend follow up with neurology in 4 weeks for repeat CT and, if stable/improved, will consider initiating  antiplatelet therapy at that time. She would benefit from CIR.  Neurology will sign off. Please call if you have any questions or concerns.   Merrilyn Puma, MD [facility], PGY-3 07/31/2022, 2:53 PM  ATTENDING NOTE: I reviewed above note and agree with the assessment and plan. Pt was seen and examined.   No family at bedside.  Patient lying in bed, eyes closed, will open on voice, able to maintain opening.  Seems able to follow commands on eye closure, left hand showing fingers and make a fist, and left foot wiggle toes.  Not able to follow commands on most open or tongue protrusion.  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  Continue statin, pending CIR.  For detailed assessment and plan, please refer to above/below as I have made changes wherever appropriate.   Neurology will sign off. Please call with questions. Pt will follow up with stroke clinic NP at [facility] in about 4 weeks. Thanks for the consult.   Marvel Plan, MD PhD Stroke Neurology 07/31/2022 6:47 PM   To contact Stroke Continuity provider, please refer to [email or web address]. After hours, contact General Neurology

## 2022-08-01 DIAGNOSIS — I611 Nontraumatic intracerebral hemorrhage in hemisphere, cortical: Secondary | ICD-10-CM | POA: Diagnosis not present

## 2022-08-01 DIAGNOSIS — E870 Hyperosmolality and hypernatremia: Secondary | ICD-10-CM | POA: Diagnosis not present

## 2022-08-01 DIAGNOSIS — N390 Urinary tract infection, site not specified: Secondary | ICD-10-CM | POA: Diagnosis not present

## 2022-08-01 DIAGNOSIS — R509 Fever, unspecified: Secondary | ICD-10-CM | POA: Diagnosis not present

## 2022-08-01 LAB — CBC WITH DIFFERENTIAL/PLATELET
Abs Immature Granulocytes: 0.03 10*3/uL (ref 0.00–0.07)
Basophils Absolute: 0.1 10*3/uL (ref 0.0–0.1)
Basophils Relative: 1 %
Eosinophils Absolute: 0.2 10*3/uL (ref 0.0–0.5)
Eosinophils Relative: 2 %
HCT: 43.8 % (ref 36.0–46.0)
Hemoglobin: 14 g/dL (ref 12.0–15.0)
Immature Granulocytes: 0 %
Lymphocytes Relative: 22 %
Lymphs Abs: 2.3 10*3/uL (ref 0.7–4.0)
MCH: 28.1 pg (ref 26.0–34.0)
MCHC: 32 g/dL (ref 30.0–36.0)
MCV: 88 fL (ref 80.0–100.0)
Monocytes Absolute: 1 10*3/uL (ref 0.1–1.0)
Monocytes Relative: 10 %
Neutro Abs: 6.7 10*3/uL (ref 1.7–7.7)
Neutrophils Relative %: 65 %
Platelets: 254 10*3/uL (ref 150–400)
RBC: 4.98 MIL/uL (ref 3.87–5.11)
RDW: 15.5 % (ref 11.5–15.5)
WBC: 10.3 10*3/uL (ref 4.0–10.5)
nRBC: 0 % (ref 0.0–0.2)

## 2022-08-01 LAB — GLUCOSE, CAPILLARY
Glucose-Capillary: 138 mg/dL — ABNORMAL HIGH (ref 70–99)
Glucose-Capillary: 176 mg/dL — ABNORMAL HIGH (ref 70–99)
Glucose-Capillary: 195 mg/dL — ABNORMAL HIGH (ref 70–99)
Glucose-Capillary: 202 mg/dL — ABNORMAL HIGH (ref 70–99)
Glucose-Capillary: 207 mg/dL — ABNORMAL HIGH (ref 70–99)
Glucose-Capillary: 210 mg/dL — ABNORMAL HIGH (ref 70–99)

## 2022-08-01 LAB — COMPREHENSIVE METABOLIC PANEL
ALT: 60 U/L — ABNORMAL HIGH (ref 0–44)
AST: 34 U/L (ref 15–41)
Albumin: 3.3 g/dL — ABNORMAL LOW (ref 3.5–5.0)
Alkaline Phosphatase: 82 U/L (ref 38–126)
Anion gap: 11 (ref 5–15)
BUN: 28 mg/dL — ABNORMAL HIGH (ref 8–23)
CO2: 30 mmol/L (ref 22–32)
Calcium: 9.3 mg/dL (ref 8.9–10.3)
Chloride: 109 mmol/L (ref 98–111)
Creatinine, Ser: 1.18 mg/dL — ABNORMAL HIGH (ref 0.44–1.00)
GFR, Estimated: 52 mL/min — ABNORMAL LOW (ref >60)
Glucose, Bld: 168 mg/dL — ABNORMAL HIGH (ref 70–99)
Potassium: 4.2 mmol/L (ref 3.5–5.1)
Sodium: 150 mmol/L — ABNORMAL HIGH (ref 135–145)
Total Bilirubin: 0.5 mg/dL (ref 0.3–1.2)
Total Protein: 7 g/dL (ref 6.5–8.1)

## 2022-08-01 LAB — URINE CULTURE: Culture: >=100000 — AB

## 2022-08-01 LAB — CULTURE, BLOOD (ROUTINE X 2)
Culture: NO GROWTH
Culture: NO GROWTH

## 2022-08-01 LAB — MAGNESIUM: Magnesium: 3 mg/dL — ABNORMAL HIGH (ref 1.7–2.4)

## 2022-08-01 LAB — PHOSPHORUS: Phosphorus: 4.5 mg/dL (ref 2.5–4.6)

## 2022-08-01 MED ORDER — DEXTROSE 5 % IV SOLN: INTRAVENOUS | Status: DC

## 2022-08-01 NOTE — Progress Notes (Signed)
PROGRESS NOTE    Anna Berry  ZOX:[id number] DOB: 1960-04-10 DOA: 07/24/2022 PCP: Maury Dus, MD   Brief Narrative:  The patient is a 62 year old African-American female with past medical history significant for prolonged to sickle cell trait, sigmoid diverticulosis, asthma as well as osteoarthritis and other comorbidities who was brought to the hospital by EMS with inability to speak. Per report she drove from at 5 PM walked into her house and had multiple concerns. Family called EMS and she was brought in as a code stroke. She is able to tell admitting physician her name and age and current month her voice is very hypophonic. 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 She was transferred out of the intensive care unit to the tried hospitalist service on 07/29/2022.    The day that she was transferred to the [facility] service she spiked a temperature so further workup is being done.  Nursing states that she is starting have some diarrhea so we will check her for an infectious etiology.  Currently unclear etiology of her fevers.  Has a Cortrak in place and will continue for now and continue SLP.  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 distribution with concern for hemorrhagic transformation and then had a new left frontal ICH lateral to the previous hematoma with unclear etiology -MRI and repeat CT done -CTV showed no venous thrombosis -Echocardiogram done and showed a normal EF of 50 to 55% with no regional wall motion abnormalities and normal left atrial size -EEG done and showed no seizure -Hemoglobin A1c was 6.0 and LDL was 105 -Not on any antithrombotics due to ICH -He is on a statin now -PT OT recommending Inpatient Rehab but does not have insurance  coverage for inpatient rehab here and cannot go to rehab with Cortrak so will need to either be able to tolerate a diet or will require a PEG -Neurology following recommending allowing for the blood to be better absorbed and planning a repeat head CT in about 4 weeks and if this is stable and improved consider antiplatelet therapy at that time -Following some commands    Elevated blood pressure -Weaned off of Cleviprex -Given that her repeat CT scan on 530 was stable the neurologist physical extra blood pressure goal to 130s to 150s systolic and continues recommend long-term blood pressure goal being normotensive -Currently has labetalol 10 mg IV every 2 as needed as needed for SBP above 150 -Last BP was 112/72   Hyperlipidemia -Patient had a total cholesterol/HDL ratio of 3.3, cholesterol level 164, HDL of 49, LDL 105, triglycerides of 52, VLDL of 10 -Neurology is initiated the patient on a statin rosuvastatin 20 mg per tube daily   Dysphagia -Did not pass her swallow screen and remains n.p.o. for now -Speech therapy consulted and following for SLE and p.o. readiness -Has a small bore cortrak TF currently and SLP evaluating and recommending NPO status to be maintained and will pursue an MBS but deferred yesterday due to Radiology Schedule; Will need to follow up with SLP    Hypernatremia -Na+ Trend: Recent Labs  Lab 07/26/22 0255 07/27/22 0415 07/28/22 0305 07/29/22 0740 07/30/22 0429 07/31/22 1411 08/01/22 0408  NA 143 141 149* 151* 146* 148* 150*  -IVF has now been resumed -Water flushes  have been initiated but may need to be increased given that sodium still 148 -Repeat CMP   Fever in the setting of UTI -Unclear Etiology -Spiked a Temperature of 100.4 yesterday and wet up to 101.2 -Pan Culture the Patient -WBC and Lactic Acid Trend: Recent Labs  Lab 07/26/22 0255 07/27/22 0415 07/28/22 0305 07/29/22 0740 07/29/22 1204 07/30/22 0429 07/30/22 0849 07/30/22 1149  07/31/22 1411 08/01/22 0408  WBC 10.6* 10.7* 9.4 11.7*  --  13.0*  --   --  10.9* 10.3  LATICACIDVEN  --   --   --   --    < >  --    < > 1.4  --   --    < > = values in this interval not displayed.  -PCT Negative at <0.10 -Checking blood cultures x 2 and showing NGTD < 24 hours -Urinalysis done and showed a hazy appearance with moderate hemoglobin, moderate leukocytes, negative nitrites, many bacteria, 11-20 RBCs per high-power field, limited 20 WBCs and will send for urine culture before initiating antibiotics; Urine Cx showing >100,000 of E Coli and 20,000 CFU of Klebsiella Pneumoniae; Sensitivities for E Coli Sensitive to Ceftriaxone and Klebsiella sensitivity is still pending -Chest x-ray done and showed "There is a left arm PICC line with tip at the superior cavoatrial junction. Feeding tube courses below the level of the GE junction. Heart size appears normal. No pleural fluid or airspace disease. The visualized osseous structures are unremarkable." -IVF had stopped but will resume again -GI Pathogen panel negative    Abnormal LFTS -In the setting of TF? -Mild and LFT Trend: Recent Labs  Lab 07/24/22 2035 07/29/22 0740 07/30/22 0429 07/31/22 1411 08/01/22 0408  AST 27 35 26 45* 34  ALT 19 24 31  63* 60*  -Continue to Monitor and Trend and repeat CMP in the AM    Lactic Acidosis -Lactic acid level trend: Recent Labs  Lab 07/29/22 1204 07/29/22 1506 07/30/22 0849 07/30/22 1149  LATICACIDVEN 2.3* 3.2* 1.5 1.4  -ReStarted IV fluid hydration D5W given Hypernatremia; Continue on Free Water Flushes -Will not repeat now improved   GERD/GI prophylaxis -Continue with Pantoprazole 40 mg IV nightly  Hypoalbuminemia -Patient's Albumin Trend Recent Labs  Lab 07/24/22 2035 07/29/22 0740 07/30/22 0429 07/31/22 1411 08/01/22 0408  ALBUMIN 4.2 4.1 3.8 3.6 3.3*  -Continue to Monitor and Trend and repeat CMP in the AM   Obesity -Complicates overall prognosis and  care -Estimated body mass index is 31.49 kg/m as calculated from the following:   Height as of this encounter: 5\' 6"  (1.676 m).   Weight as of this encounter: 88.5 kg.  -Weight Loss and Dietary Counseling given will be given once more awake  DVT prophylaxis: heparin injection 5,000 Units Start: 07/27/22 1300 SCD's Start: 07/25/22 0359    Code Status: Full Code Family Communication: No family present at bedside  Disposition Plan:  Level of care: Telemetry Medical Status is: Inpatient Remains inpatient appropriate because: Needs further clin   Consultants:  Neurology  Procedures:  As delineated as above  Antimicrobials:  Anti-infectives (From admission, onward)    Start     Dose/Rate Route Frequency Ordered Stop   07/31/22 1600  cefTRIAXone (ROCEPHIN) 1 g in sodium chloride 0.9 % 100 mL IVPB        1 g 200 mL/hr over 30 Minutes Intravenous Every 24 hours 07/31/22 1503         Subjective: And examined at bedside and she is more awake and  follows commands on the left side but unable to do so on the right given her hemiplegia.  Unable to communicate to me and continues to global aphasia.  Continues to have cortrak.  Awaiting SLP evaluation.   Objective: Vitals:   08/01/22 0500 08/01/22 0748 08/01/22 1156 08/01/22 1256  BP:  126/77 96/69 109/70  Pulse:  91 94 93  Resp:  18 18   Temp:  98.6 F (37 C) 98.3 F (36.8 C)   TempSrc:  Oral Oral   SpO2:  93% 93% 95%  Weight: 88.5 kg     Height:        Intake/Output Summary (Last 24 hours) at 08/01/2022 1503 Last data filed at 08/01/2022 1610 Gross per 24 hour  Intake 100 ml  Output 800 ml  Net -700 ml   Filed Weights   07/29/22 0436 07/31/22 0500 08/01/22 0500  Weight: 87.5 kg 88.6 kg 88.5 kg   Examination: Physical Exam:  Constitutional: WN/WD obese AAF who is awake today but continues to not interact and continues global aphasia and right hemiplegia Respiratory: Diminished to auscultation bilaterally with coarse  breath sounds, no wheezing, rales, rhonchi or crackles. Normal respiratory effort and patient is not tachypenic. No accessory muscle use.  Unlabored breathing Cardiovascular: RRR, no murmurs / rubs / gallops. S1 and S2 auscultated. No extremity edema.  Abdomen: Soft, non-tender, distended secondary body habitus. Bowel sounds positive.  GU: Deferred. Musculoskeletal: No clubbing / cyanosis of digits/nails. No joint deformity upper and lower extremities. Skin: No rashes, lesions, ulcers limited skin evaluation. No induration; Warm and dry.  Neurologic: Is more awake and able to follow commands on the left but unable to do so on the right and continues remain aphasic oriented x 3. Normal mood and appropriate affect.   Data Reviewed: I have personally reviewed following labs and imaging studies  CBC: Recent Labs  Lab 07/28/22 0305 07/29/22 0740 07/30/22 0429 07/31/22 1411 08/01/22 0408  WBC 9.4 11.7* 13.0* 10.9* 10.3  NEUTROABS  --   --  9.2* 7.6 6.7  HGB 11.7* 14.1 14.1 14.0 14.0  HCT 35.6* 43.9 43.6 44.0 43.8  MCV 85.6 85.4 86.7 88.2 88.0  PLT 227 260 248 248 254   Basic Metabolic Panel: Recent Labs  Lab 07/28/22 0305 07/28/22 1811 07/29/22 0740 07/30/22 0429 07/31/22 1411 08/01/22 0408  NA 149*  --  151* 146* 148* 150*  K 3.3*  --  4.2 3.9 4.3 4.2  CL 117*  --  116* 111 111 109  CO2 23  --  27 26 26 30   GLUCOSE 117*  --  169* 193* 178* 168*  BUN 15  --  17 17 30* 28*  CREATININE 0.85  --  1.08* 1.09* 1.18* 1.18*  CALCIUM 8.2*  --  10.0 9.3 9.2 9.3  MG 2.2 2.7* 2.7* 2.6* 2.8* 3.0*  PHOS 2.6 2.6 3.3 3.8 4.3 4.5   GFR: Estimated Creatinine Clearance: 54.7 mL/min (A) (by C-G formula based on SCr of 1.18 mg/dL (H)). Liver Function Tests: Recent Labs  Lab 07/29/22 0740 07/30/22 0429 07/31/22 1411 08/01/22 0408  AST 35 26 45* 34  ALT 24 31 63* 60*  ALKPHOS 84 77 76 82  BILITOT 1.0 0.6 0.4 0.5  PROT 7.8 7.6 7.3 7.0  ALBUMIN 4.1 3.8 3.6 3.3*   No results for  input(s): "LIPASE", "AMYLASE" in the last 168 hours. No results for input(s): "AMMONIA" in the last 168 hours. Coagulation Profile: No results for input(s): "INR", "PROTIME" in  the last 168 hours. Cardiac Enzymes: No results for input(s): "CKTOTAL", "CKMB", "CKMBINDEX", "TROPONINI" in the last 168 hours. BNP (last 3 results) No results for input(s): "PROBNP" in the last 8760 hours. HbA1C: No results for input(s): "HGBA1C" in the last 72 hours. CBG: Recent Labs  Lab 07/31/22 1955 07/31/22 2337 08/01/22 0428 08/01/22 0750 08/01/22 1157  GLUCAP 210* 177* 138* 210* 176*   Lipid Profile: No results for input(s): "CHOL", "HDL", "LDLCALC", "TRIG", "CHOLHDL", "LDLDIRECT" in the last 72 hours. Thyroid Function Tests: No results for input(s): "TSH", "T4TOTAL", "FREET4", "T3FREE", "THYROIDAB" in the last 72 hours. Anemia Panel: No results for input(s): "VITAMINB12", "FOLATE", "FERRITIN", "TIBC", "IRON", "RETICCTPCT" in the last 72 hours. Sepsis Labs: Recent Labs  Lab 07/29/22 0740 07/29/22 1204 07/29/22 1506 07/30/22 0849 07/30/22 1149  PROCALCITON <0.10  --   --   --   --   LATICACIDVEN  --  2.3* 3.2* 1.5 1.4    Recent Results (from the past 240 hour(s))  MRSA Next Gen by PCR, Nasal     Status: None   Collection Time: 07/25/22  5:46 AM   Specimen: Nasal Mucosa; Nasal Swab  Result Value Ref Range Status   MRSA by PCR Next Gen NOT DETECTED NOT DETECTED Final    Comment: (NOTE) The GeneXpert MRSA Assay (FDA approved for NASAL specimens only), is one component of a comprehensive MRSA colonization surveillance program. It is not intended to diagnose MRSA infection nor to guide or monitor treatment for MRSA infections. Test performance is not FDA approved in patients less than 68 years old. Performed at [facility], [address], [place], [place] [address]   Culture, blood (Routine X 2) w Reflex to ID Panel     Status: None (Preliminary result)   Collection Time:  07/29/22 12:04 PM   Specimen: BLOOD RIGHT HAND  Result Value Ref Range Status   Specimen Description BLOOD RIGHT HAND  Final   Special Requests   Final    BOTTLES DRAWN AEROBIC AND ANAEROBIC Blood Culture adequate volume   Culture   Final    NO GROWTH 3 DAYS Performed at [facility], [address] [place], [place] [address]    Report Status PENDING  Incomplete  Culture, blood (Routine X 2) w Reflex to ID Panel     Status: None (Preliminary result)   Collection Time: 07/29/22 12:04 PM   Specimen: BLOOD RIGHT ARM  Result Value Ref Range Status   Specimen Description BLOOD RIGHT ARM  Final   Special Requests   Final    BOTTLES DRAWN AEROBIC AND ANAEROBIC Blood Culture results may not be optimal due to an inadequate volume of blood received in culture bottles   Culture   Final    NO GROWTH 3 DAYS Performed at [facility], [address] [place], [place] [address]    Report Status PENDING  Incomplete  Urine Culture (for pregnant, neutropenic or urologic patients or patients with an indwelling urinary catheter)     Status: Abnormal (Preliminary result)   Collection Time: 07/29/22  6:08 PM   Specimen: Urine, Clean Catch  Result Value Ref Range Status   Specimen Description URINE, CLEAN CATCH  Final   Special Requests NONE  Final   Culture (A)  Final    >=100,000 COLONIES/mL ESCHERICHIA COLI 20,000 COLONIES/mL KLEBSIELLA PNEUMONIAE SUSCEPTIBILITIES TO FOLLOW Performed at [facility], [address], [place], [place] [address]    Report Status PENDING  Incomplete  Organism ID, Bacteria ESCHERICHIA COLI (A)  Final      Susceptibility   Escherichia coli - MIC*    AMPICILLIN >=32 RESISTANT Resistant     CEFAZOLIN <=4 SENSITIVE Sensitive     CEFEPIME <=0.12 SENSITIVE Sensitive     CEFTRIAXONE <=0.25 SENSITIVE Sensitive     CIPROFLOXACIN >=4 RESISTANT Resistant     GENTAMICIN 4 SENSITIVE Sensitive     IMIPENEM <=0.25 SENSITIVE Sensitive     NITROFURANTOIN <=16  SENSITIVE Sensitive     TRIMETH/SULFA <=20 SENSITIVE Sensitive     AMPICILLIN/SULBACTAM >=32 RESISTANT Resistant     PIP/TAZO <=4 SENSITIVE Sensitive     * >=100,000 COLONIES/mL ESCHERICHIA COLI  Gastrointestinal Panel by PCR , Stool     Status: None   Collection Time: 07/30/22  8:34 AM   Specimen: Stool  Result Value Ref Range Status   Campylobacter species NOT DETECTED NOT DETECTED Final   Plesimonas shigelloides NOT DETECTED NOT DETECTED Final   Salmonella species NOT DETECTED NOT DETECTED Final   Yersinia enterocolitica NOT DETECTED NOT DETECTED Final   Vibrio species NOT DETECTED NOT DETECTED Final   Vibrio cholerae NOT DETECTED NOT DETECTED Final   Enteroaggregative E coli (EAEC) NOT DETECTED NOT DETECTED Final   Enteropathogenic E coli (EPEC) NOT DETECTED NOT DETECTED Final   Enterotoxigenic E coli (ETEC) NOT DETECTED NOT DETECTED Final   Shiga like toxin producing E coli (STEC) NOT DETECTED NOT DETECTED Final   Shigella/Enteroinvasive E coli (EIEC) NOT DETECTED NOT DETECTED Final   Cryptosporidium NOT DETECTED NOT DETECTED Final   Cyclospora cayetanensis NOT DETECTED NOT DETECTED Final   Entamoeba histolytica NOT DETECTED NOT DETECTED Final   Giardia lamblia NOT DETECTED NOT DETECTED Final   Adenovirus F40/41 NOT DETECTED NOT DETECTED Final   Astrovirus NOT DETECTED NOT DETECTED Final   Norovirus GI/GII NOT DETECTED NOT DETECTED Final   Rotavirus A NOT DETECTED NOT DETECTED Final   Sapovirus (I, II, IV, and V) NOT DETECTED NOT DETECTED Final    Comment: Performed at [facility], [address], [place], [place] [address]    Radiology Studies: No results found.  Scheduled Meds:  Chlorhexidine Gluconate Cloth  6 each Topical Q0600   feeding supplement (PROSource TF20)  60 mL Per Tube Daily   free water  200 mL Per Tube Q4H   heparin injection (subcutaneous)  5,000 Units Subcutaneous Q8H   insulin aspart  0-9 Units Subcutaneous Q4H   mouth rinse  15 mL Mouth  Rinse 4 times per day   pantoprazole (PROTONIX) IV  40 mg Intravenous QHS   polyethylene glycol  17 g Per Tube BID   rosuvastatin  20 mg Per Tube Daily   senna-docusate  1 tablet Per Tube BID   sodium chloride flush  10-40 mL Intracatheter Q12H   Continuous Infusions:  cefTRIAXone (ROCEPHIN)  IV Stopped (07/31/22 1609)   dextrose 75 mL/hr at 08/01/22 0932   feeding supplement (JEVITY 1.5 CAL/FIBER) 1,000 mL (08/01/22 1303)    LOS: 7 days   Marguerita Merles, DO [facility] Available via Epic secure chat 7am-7pm After these hours, please refer to coverage provider listed on [email or web address] 08/01/2022, 3:03 PM

## 2022-08-02 DIAGNOSIS — I611 Nontraumatic intracerebral hemorrhage in hemisphere, cortical: Secondary | ICD-10-CM | POA: Diagnosis not present

## 2022-08-02 DIAGNOSIS — N390 Urinary tract infection, site not specified: Secondary | ICD-10-CM | POA: Diagnosis not present

## 2022-08-02 DIAGNOSIS — R509 Fever, unspecified: Secondary | ICD-10-CM | POA: Diagnosis not present

## 2022-08-02 DIAGNOSIS — E870 Hyperosmolality and hypernatremia: Secondary | ICD-10-CM | POA: Diagnosis not present

## 2022-08-02 LAB — GLUCOSE, CAPILLARY
Glucose-Capillary: 205 mg/dL — ABNORMAL HIGH (ref 70–99)
Glucose-Capillary: 215 mg/dL — ABNORMAL HIGH (ref 70–99)
Glucose-Capillary: 207 mg/dL — ABNORMAL HIGH (ref 70–99)
Glucose-Capillary: 187 mg/dL — ABNORMAL HIGH (ref 70–99)
Glucose-Capillary: 214 mg/dL — ABNORMAL HIGH (ref 70–99)

## 2022-08-02 LAB — COMPREHENSIVE METABOLIC PANEL
ALT: 74 U/L — ABNORMAL HIGH (ref 0–44)
AST: 47 U/L — ABNORMAL HIGH (ref 15–41)
Albumin: 3 g/dL — ABNORMAL LOW (ref 3.5–5.0)
Alkaline Phosphatase: 83 U/L (ref 38–126)
Anion gap: 8 (ref 5–15)
BUN: 23 mg/dL (ref 8–23)
CO2: 30 mmol/L (ref 22–32)
Calcium: 9 mg/dL (ref 8.9–10.3)
Chloride: 107 mmol/L (ref 98–111)
Creatinine, Ser: 1.12 mg/dL — ABNORMAL HIGH (ref 0.44–1.00)
GFR, Estimated: 55 mL/min — ABNORMAL LOW (ref >60)
Glucose, Bld: 214 mg/dL — ABNORMAL HIGH (ref 70–99)
Potassium: 3.9 mmol/L (ref 3.5–5.1)
Sodium: 145 mmol/L (ref 135–145)
Total Bilirubin: 0.7 mg/dL (ref 0.3–1.2)
Total Protein: 6.5 g/dL (ref 6.5–8.1)

## 2022-08-02 LAB — CBC WITH DIFFERENTIAL/PLATELET
Abs Immature Granulocytes: 0.03 10*3/uL (ref 0.00–0.07)
Basophils Absolute: 0.1 10*3/uL (ref 0.0–0.1)
Basophils Relative: 1 %
Eosinophils Absolute: 0.3 10*3/uL (ref 0.0–0.5)
Eosinophils Relative: 3 %
HCT: 40.6 % (ref 36.0–46.0)
Hemoglobin: 12.8 g/dL (ref 12.0–15.0)
Immature Granulocytes: 0 %
Lymphocytes Relative: 24 %
Lymphs Abs: 2.3 10*3/uL (ref 0.7–4.0)
MCH: 28.1 pg (ref 26.0–34.0)
MCHC: 31.5 g/dL (ref 30.0–36.0)
MCV: 89.2 fL (ref 80.0–100.0)
Monocytes Absolute: 0.9 10*3/uL (ref 0.1–1.0)
Monocytes Relative: 10 %
Neutro Abs: 5.9 10*3/uL (ref 1.7–7.7)
Neutrophils Relative %: 62 %
Platelets: 245 10*3/uL (ref 150–400)
RBC: 4.55 MIL/uL (ref 3.87–5.11)
RDW: 15.2 % (ref 11.5–15.5)
WBC: 9.5 10*3/uL (ref 4.0–10.5)
nRBC: 0 % (ref 0.0–0.2)

## 2022-08-02 LAB — PHOSPHORUS: Phosphorus: 4 mg/dL (ref 2.5–4.6)

## 2022-08-02 LAB — MAGNESIUM: Magnesium: 2.7 mg/dL — ABNORMAL HIGH (ref 1.7–2.4)

## 2022-08-02 LAB — URINE CULTURE

## 2022-08-02 LAB — CULTURE, BLOOD (ROUTINE X 2): Special Requests: ADEQUATE

## 2022-08-02 NOTE — Progress Notes (Signed)
PROGRESS NOTE    Anna Berry  ZOX:[id number] DOB: 28-Jul-1960 DOA: 07/24/2022 PCP: Maury Dus, MD   Brief Narrative:  The patient is a 62 year old African-American female with past medical history significant for prolonged to sickle cell trait, sigmoid diverticulosis, asthma as well as osteoarthritis and other comorbidities who was brought to the hospital by EMS with inability to speak. Per report she drove from at 5 PM walked into her house and had multiple concerns. Family called EMS and she was brought in as a code stroke. She is able to tell admitting physician her name and age and current month her voice is very hypophonic. 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 She was transferred out of the intensive care unit to the tried hospitalist service on 07/29/2022.    The day that she was transferred to the [facility] service she spiked a temperature so further workup is being done.  Nursing states that she is starting have some diarrhea so we will check her for an infectious etiology.  Currently unclear etiology of her fevers.  Has a Cortrak in place and will continue for now and continue SLP.  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 distribution with concern for hemorrhagic transformation and then had a new left frontal ICH lateral to the previous hematoma with unclear etiology -MRI and repeat CT done -CTV showed no venous thrombosis -Echocardiogram done and showed a normal EF of 50 to 55% with no regional wall motion abnormalities and normal left atrial size -EEG done and showed no seizure -Hemoglobin A1c was 6.0 and LDL was 105 -Not on any antithrombotics due to ICH -He is on a statin now -PT OT recommending Inpatient Rehab but does not have insurance  coverage for inpatient rehab here and cannot go to rehab with Cortrak so will need to either be able to tolerate a diet or will require a PEG; Will follow on SLP re-evalaution and will need PEG likely  -Neurology following recommending allowing for the blood to be better absorbed and planning a repeat head CT in about 4 weeks and if this is stable and improved consider antiplatelet therapy at that time -Following some commands    Elevated blood pressure -Weaned off of Cleviprex -Given that her repeat CT scan on 530 was stable the neurologist physical extra blood pressure goal to 130s to 150s systolic and continues recommend long-term blood pressure goal being normotensive -Currently has labetalol 10 mg IV every 2 as needed as needed for SBP above 150 -Last BP was 120/67   Hyperlipidemia -Patient had a total cholesterol/HDL ratio of 3.3, cholesterol level 164, HDL of 49, LDL 105, triglycerides of 52, VLDL of 10 -Neurology is initiated the patient on a statin rosuvastatin 20 mg per tube daily   Dysphagia -Did not pass her swallow screen and remains n.p.o. for now -Speech therapy consulted and following for SLE and p.o. readiness -Has a small bore cortrak TF currently and SLP evaluating and recommending NPO status to be maintained and will pursue an MBS but deferred yesterday due to Radiology Schedule; Will need to follow up with SLP and they still have to re-evaluate   Hypernatremia -Na+ Trend: Recent Labs  Lab 07/27/22 0415 07/28/22 0305 07/29/22 0740 07/30/22 0429 07/31/22 1411 08/01/22 0408 08/02/22 0440  NA 141 149* 151* 146* 148* 150* 145  -IVF has now been resumed -Continue Free Water Flushes -Repeat CMP in the AM    Fever in the setting of UTI -Unclear Etiology -Spiked a Temperature of 100.4 the day she was transferred to [facility] and went up to 101.2; No further fevers  -Pan Culture the Patient -WBC and Lactic Acid Trend: Recent Labs  Lab 07/27/22 0415 07/28/22 0305  07/29/22 0740 07/29/22 1204 07/29/22 1506 07/30/22 0429 07/30/22 0849 07/30/22 1149 07/31/22 1411 08/01/22 0408 08/02/22 0440  WBC 10.7* 9.4 11.7*  --   --  13.0*  --   --  10.9* 10.3 9.5  LATICACIDVEN  --   --   --  2.3* 3.2*  --  1.5 1.4  --   --   --   -PCT Negative at <0.10 -Checking blood cultures x 2 and showing NGTD < 24 hours -Urinalysis done and showed a hazy appearance with moderate hemoglobin, moderate leukocytes, negative nitrites, many bacteria, 11-20 RBCs per high-power field, limited 20 WBCs and will send for urine culture before initiating antibiotics; Urine Cx showing >100,000 of E Coli and 20,000 CFU of Klebsiella Pneumoniae; Sensitivities for E Coli and Klebsiella Sensitive to Ceftriaxone (Will treat for 7 days total)  -Chest x-ray done and showed "There is a left arm PICC line with tip at the superior cavoatrial junction. Feeding tube courses below the level of the GE junction. Heart size appears normal. No pleural fluid or airspace disease. The visualized osseous structures are unremarkable." -IVF had stopped but will resume again -GI Pathogen panel negative    Abnormal LFTS -In the setting of TF? -Mild and LFT Trend: Recent Labs  Lab 07/24/22 2035 07/29/22 0740 07/30/22 0429 07/31/22 1411 08/01/22 0408 08/02/22 0440  AST 27 35 26 45* 34 47*  ALT 19 24 31  63* 60* 74*  -Continue to Monitor and Trend and repeat CMP in the AM    Lactic Acidosis -Lactic acid level trend: Recent Labs  Lab 07/29/22 1204 07/29/22 1506 07/30/22 0849 07/30/22 1149  LATICACIDVEN 2.3* 3.2* 1.5 1.4  -ReStarted IV fluid hydration D5W given Hypernatremia; Continue on Free Water Flushes -Will not repeat now improved   GERD/GI prophylaxis -Continue with Pantoprazole 40 mg IV nightly   Hypoalbuminemia -Patient's Albumin Trend Recent Labs  Lab 07/24/22 2035 07/29/22 0740 07/30/22 0429 07/31/22 1411 08/01/22 0408 08/02/22 0440  ALBUMIN 4.2 4.1 3.8 3.6 3.3* 3.0*   -Continue to Monitor and Trend and repeat CMP in the AM   Obesity -Complicates overall prognosis and care -Estimated body mass index is 32.27 kg/m as calculated from the following:   Height as of this encounter: 5\' 6"  (1.676 m).   Weight as of this encounter: 90.7 kg.  -Weight Loss and Dietary Counseling given will be given once more awake  DVT prophylaxis: heparin injection 5,000 Units Start: 07/27/22 1300 SCD's Start: 07/25/22 0359    Code Status: Full Code Family Communication: No family present at bedside  Disposition Plan:  Level of care: Telemetry Medical Status is: Inpatient Remains inpatient appropriate because: Needs further clinical Improvement and will need to go to CIR but will need to see about Nutrition   Consultants:  Neurology  Procedures:  As delineated as above  Antimicrobials:  Anti-infectives (From admission, onward)    Start     Dose/Rate Route Frequency Ordered Stop   07/31/22 1600  cefTRIAXone (ROCEPHIN) 1 g in sodium chloride 0.9 % 100 mL IVPB  1 g 200 mL/hr over 30 Minutes Intravenous Every 24 hours 07/31/22 1503 08/06/22 2359       Subjective: Seen and examined at bedside and is awake and follows some commands and alert but unable to move on the right given her hemiplegia.  Continues to be unable to communicate due to her global aphasia and has a core track.  Awaiting SLP reevaluation still for MBS.  Objective: Vitals:   08/02/22 0444 08/02/22 0500 08/02/22 0803 08/02/22 1154  BP: 109/72  132/70 120/67  Pulse: 85  88 83  Resp: 17  17 16   Temp: 98 F (36.7 C)  98.4 F (36.9 C) 97.8 F (36.6 C)  TempSrc: Oral  Oral Oral  SpO2: 94%  94% 95%  Weight:  90.7 kg    Height:        Intake/Output Summary (Last 24 hours) at 08/02/2022 1523 Last data filed at 08/02/2022 1314 Gross per 24 hour  Intake 1554.69 ml  Output 500 ml  Net 1054.69 ml   Filed Weights   07/31/22 0500 08/01/22 0500 08/02/22 0500  Weight: 88.6 kg 88.5 kg 90.7 kg    Examination: Physical Exam:  Constitutional: WN/WD obese African-American female who is awake but confused not to interact given her global aphasia and right hemiplegia Respiratory: Diminished to auscultation bilaterally with coarse breath sounds, no wheezing, rales, rhonchi or crackles. Normal respiratory effort and patient is not tachypenic. No accessory muscle use.  Unlabored breathing Cardiovascular: RRR, no murmurs / rubs / gallops. S1 and S2 auscultated. No extremity edema.  Abdomen: Soft, non-tender, distended secondary body habitus.  Bowel sounds positive.  GU: Deferred. Musculoskeletal: No clubbing / cyanosis of digits/nails. No joint deformity upper and lower extremities. Skin: No rashes, lesions, ulcers limited skin evaluation. No induration; Warm and dry.  Neurologic: Is awake and able to follow some commands of the left but able to do so on the right and continues remain aphasic  Data Reviewed: I have personally reviewed following labs and imaging studies  CBC: Recent Labs  Lab 07/29/22 0740 07/30/22 0429 07/31/22 1411 08/01/22 0408 08/02/22 0440  WBC 11.7* 13.0* 10.9* 10.3 9.5  NEUTROABS  --  9.2* 7.6 6.7 5.9  HGB 14.1 14.1 14.0 14.0 12.8  HCT 43.9 43.6 44.0 43.8 40.6  MCV 85.4 86.7 88.2 88.0 89.2  PLT 260 248 248 254 245   Basic Metabolic Panel: Recent Labs  Lab 07/29/22 0740 07/30/22 0429 07/31/22 1411 08/01/22 0408 08/02/22 0440  NA 151* 146* 148* 150* 145  K 4.2 3.9 4.3 4.2 3.9  CL 116* 111 111 109 107  CO2 27 26 26 30 30   GLUCOSE 169* 193* 178* 168* 214*  BUN 17 17 30* 28* 23  CREATININE 1.08* 1.09* 1.18* 1.18* 1.12*  CALCIUM 10.0 9.3 9.2 9.3 9.0  MG 2.7* 2.6* 2.8* 3.0* 2.7*  PHOS 3.3 3.8 4.3 4.5 4.0   GFR: Estimated Creatinine Clearance: 58.4 mL/min (A) (by C-G formula based on SCr of 1.12 mg/dL (H)). Liver Function Tests: Recent Labs  Lab 07/29/22 0740 07/30/22 0429 07/31/22 1411 08/01/22 0408 08/02/22 0440  AST 35 26 45* 34 47*  ALT  24 31 63* 60* 74*  ALKPHOS 84 77 76 82 83  BILITOT 1.0 0.6 0.4 0.5 0.7  PROT 7.8 7.6 7.3 7.0 6.5  ALBUMIN 4.1 3.8 3.6 3.3* 3.0*   No results for input(s): "LIPASE", "AMYLASE" in the last 168 hours. No results for input(s): "AMMONIA" in the last 168 hours. Coagulation Profile: No results  for input(s): "INR", "PROTIME" in the last 168 hours. Cardiac Enzymes: No results for input(s): "CKTOTAL", "CKMB", "CKMBINDEX", "TROPONINI" in the last 168 hours. BNP (last 3 results) No results for input(s): "PROBNP" in the last 8760 hours. HbA1C: No results for input(s): "HGBA1C" in the last 72 hours. CBG: Recent Labs  Lab 08/01/22 1939 08/01/22 2340 08/02/22 0443 08/02/22 0758 08/02/22 1136  GLUCAP 207* 195* 205* 215* 207*   Lipid Profile: No results for input(s): "CHOL", "HDL", "LDLCALC", "TRIG", "CHOLHDL", "LDLDIRECT" in the last 72 hours. Thyroid Function Tests: No results for input(s): "TSH", "T4TOTAL", "FREET4", "T3FREE", "THYROIDAB" in the last 72 hours. Anemia Panel: No results for input(s): "VITAMINB12", "FOLATE", "FERRITIN", "TIBC", "IRON", "RETICCTPCT" in the last 72 hours. Sepsis Labs: Recent Labs  Lab 07/29/22 0740 07/29/22 1204 07/29/22 1506 07/30/22 0849 07/30/22 1149  PROCALCITON <0.10  --   --   --   --   LATICACIDVEN  --  2.3* 3.2* 1.5 1.4    Recent Results (from the past 240 hour(s))  MRSA Next Gen by PCR, Nasal     Status: None   Collection Time: 07/25/22  5:46 AM   Specimen: Nasal Mucosa; Nasal Swab  Result Value Ref Range Status   MRSA by PCR Next Gen NOT DETECTED NOT DETECTED Final    Comment: (NOTE) The GeneXpert MRSA Assay (FDA approved for NASAL specimens only), is one component of a comprehensive MRSA colonization surveillance program. It is not intended to diagnose MRSA infection nor to guide or monitor treatment for MRSA infections. Test performance is not FDA approved in patients less than 61 years old. Performed at [facility], [address] [place], [place] [address]   Culture, blood (Routine X 2) w Reflex to ID Panel     Status: None (Preliminary result)   Collection Time: 07/29/22 12:04 PM   Specimen: BLOOD RIGHT HAND  Result Value Ref Range Status   Specimen Description BLOOD RIGHT HAND  Final   Special Requests   Final    BOTTLES DRAWN AEROBIC AND ANAEROBIC Blood Culture adequate volume   Culture   Final    NO GROWTH 4 DAYS Performed at [facility], [address], [place], [place] [address]    Report Status PENDING  Incomplete  Culture, blood (Routine X 2) w Reflex to ID Panel     Status: None (Preliminary result)   Collection Time: 07/29/22 12:04 PM   Specimen: BLOOD RIGHT ARM  Result Value Ref Range Status   Specimen Description BLOOD RIGHT ARM  Final   Special Requests   Final    BOTTLES DRAWN AEROBIC AND ANAEROBIC Blood Culture results may not be optimal due to an inadequate volume of blood received in culture bottles   Culture   Final    NO GROWTH 4 DAYS Performed at [facility], [address], [place], [place] [address]    Report Status PENDING  Incomplete  Urine Culture (for pregnant, neutropenic or urologic patients or patients with an indwelling urinary catheter)     Status: Abnormal   Collection Time: 07/29/22  6:08 PM   Specimen: Urine, Clean Catch  Result Value Ref Range Status   Specimen Description URINE, CLEAN CATCH  Final   Special Requests   Final    NONE Performed at [facility], [address], [place], [place] [address]    Culture (A)  Final    >=100,000 COLONIES/mL ESCHERICHIA COLI 20,000 COLONIES/mL KLEBSIELLA PNEUMONIAE  Report Status 08/02/2022 FINAL  Final   Organism ID, Bacteria ESCHERICHIA COLI (A)  Final   Organism ID, Bacteria KLEBSIELLA PNEUMONIAE (A)  Final      Susceptibility   Escherichia coli - MIC*    AMPICILLIN >=32 RESISTANT Resistant     CEFAZOLIN <=4 SENSITIVE Sensitive     CEFEPIME <=0.12 SENSITIVE Sensitive     CEFTRIAXONE <=0.25  SENSITIVE Sensitive     CIPROFLOXACIN >=4 RESISTANT Resistant     GENTAMICIN 4 SENSITIVE Sensitive     IMIPENEM <=0.25 SENSITIVE Sensitive     NITROFURANTOIN <=16 SENSITIVE Sensitive     TRIMETH/SULFA <=20 SENSITIVE Sensitive     AMPICILLIN/SULBACTAM >=32 RESISTANT Resistant     PIP/TAZO <=4 SENSITIVE Sensitive     * >=100,000 COLONIES/mL ESCHERICHIA COLI   Klebsiella pneumoniae - MIC*    AMPICILLIN >=32 RESISTANT Resistant     CEFAZOLIN <=4 SENSITIVE Sensitive     CEFEPIME <=0.12 SENSITIVE Sensitive     CEFTRIAXONE <=0.25 SENSITIVE Sensitive     CIPROFLOXACIN <=0.25 SENSITIVE Sensitive     GENTAMICIN <=1 SENSITIVE Sensitive     IMIPENEM <=0.25 SENSITIVE Sensitive     NITROFURANTOIN 64 INTERMEDIATE Intermediate     TRIMETH/SULFA <=20 SENSITIVE Sensitive     AMPICILLIN/SULBACTAM 4 SENSITIVE Sensitive     PIP/TAZO <=4 SENSITIVE Sensitive     * 20,000 COLONIES/mL KLEBSIELLA PNEUMONIAE  Gastrointestinal Panel by PCR , Stool     Status: None   Collection Time: 07/30/22  8:34 AM   Specimen: Stool  Result Value Ref Range Status   Campylobacter species NOT DETECTED NOT DETECTED Final   Plesimonas shigelloides NOT DETECTED NOT DETECTED Final   Salmonella species NOT DETECTED NOT DETECTED Final   Yersinia enterocolitica NOT DETECTED NOT DETECTED Final   Vibrio species NOT DETECTED NOT DETECTED Final   Vibrio cholerae NOT DETECTED NOT DETECTED Final   Enteroaggregative E coli (EAEC) NOT DETECTED NOT DETECTED Final   Enteropathogenic E coli (EPEC) NOT DETECTED NOT DETECTED Final   Enterotoxigenic E coli (ETEC) NOT DETECTED NOT DETECTED Final   Shiga like toxin producing E coli (STEC) NOT DETECTED NOT DETECTED Final   Shigella/Enteroinvasive E coli (EIEC) NOT DETECTED NOT DETECTED Final   Cryptosporidium NOT DETECTED NOT DETECTED Final   Cyclospora cayetanensis NOT DETECTED NOT DETECTED Final   Entamoeba histolytica NOT DETECTED NOT DETECTED Final   Giardia lamblia NOT DETECTED NOT  DETECTED Final   Adenovirus F40/41 NOT DETECTED NOT DETECTED Final   Astrovirus NOT DETECTED NOT DETECTED Final   Norovirus GI/GII NOT DETECTED NOT DETECTED Final   Rotavirus A NOT DETECTED NOT DETECTED Final   Sapovirus (I, II, IV, and V) NOT DETECTED NOT DETECTED Final    Comment: Performed at [facility], [address], [place], [place] [address]    Radiology Studies: No results found.  Scheduled Meds:  Chlorhexidine Gluconate Cloth  6 each Topical Q0600   feeding supplement (PROSource TF20)  60 mL Per Tube Daily   free water  200 mL Per Tube Q4H   heparin injection (subcutaneous)  5,000 Units Subcutaneous Q8H   insulin aspart  0-9 Units Subcutaneous Q4H   mouth rinse  15 mL Mouth Rinse 4 times per day   pantoprazole (PROTONIX) IV  40 mg Intravenous QHS   polyethylene glycol  17 g Per Tube BID   rosuvastatin  20 mg Per Tube Daily   senna-docusate  1 tablet Per Tube BID   sodium chloride flush  10-40 mL  Intracatheter Q12H   Continuous Infusions:  cefTRIAXone (ROCEPHIN)  IV Stopped (08/01/22 1752)   dextrose 75 mL/hr at 08/02/22 1325   feeding supplement (JEVITY 1.5 CAL/FIBER) 1,000 mL (08/02/22 1320)    LOS: 8 days   Marguerita Merles, DO [facility] Available via Epic secure chat 7am-7pm After these hours, please refer to coverage provider listed on [email or web address] 08/02/2022, 3:23 PM

## 2022-08-02 NOTE — Plan of Care (Signed)
  Problem: Education: Goal: Knowledge of disease or condition will improve Outcome: Progressing Goal: Knowledge of secondary prevention will improve (MUST DOCUMENT ALL) Outcome: Progressing Goal: Knowledge of patient specific risk factors will improve Loraine Leriche N/A or DELETE if not current risk factor) Outcome: Progressing   Problem: Intracerebral Hemorrhage Tissue Perfusion: Goal: Complications of Intracerebral Hemorrhage will be minimized Outcome: Progressing   Problem: Coping: Goal: Will verbalize positive feelings about self Outcome: Progressing Goal: Will identify appropriate support needs Outcome: Progressing   Problem: Health Behavior/Discharge Planning: Goal: Ability to manage health-related needs will improve Outcome: Progressing Goal: Goals will be collaboratively established with patient/family Outcome: Progressing   Problem: Self-Care: Goal: Ability to participate in self-care as condition permits will improve Outcome: Progressing Goal: Verbalization of feelings and concerns over difficulty with self-care will improve Outcome: Progressing Goal: Ability to communicate needs accurately will improve Outcome: Progressing   Problem: Nutrition: Goal: Risk of aspiration will decrease Outcome: Progressing Goal: Dietary intake will improve Outcome: Progressing   Problem: Education: Goal: Knowledge of General Education information will improve Description: Including pain rating scale, medication(s)/side effects and non-pharmacologic comfort measures Outcome: Progressing   Problem: Health Behavior/Discharge Planning: Goal: Ability to manage health-related needs will improve Outcome: Progressing   Problem: Clinical Measurements: Goal: Ability to maintain clinical measurements within normal limits will improve Outcome: Progressing Goal: Will remain free from infection Outcome: Progressing Goal: Diagnostic test results will improve Outcome: Progressing Goal:  Cardiovascular complication will be avoided Outcome: Progressing   Problem: Activity: Goal: Risk for activity intolerance will decrease Outcome: Progressing   Problem: Nutrition: Goal: Adequate nutrition will be maintained Outcome: Progressing   Problem: Coping: Goal: Level of anxiety will decrease Outcome: Progressing   Problem: Elimination: Goal: Will not experience complications related to bowel motility Outcome: Progressing Goal: Will not experience complications related to urinary retention Outcome: Progressing   Problem: Pain Managment: Goal: General experience of comfort will improve Outcome: Progressing   Problem: Safety: Goal: Ability to remain free from injury will improve Outcome: Progressing   Problem: Skin Integrity: Goal: Risk for impaired skin integrity will decrease Outcome: Progressing   Problem: Education: Goal: Ability to describe self-care measures that may prevent or decrease complications (Diabetes Survival Skills Education) will improve Outcome: Progressing Goal: Individualized Educational Video(s) Outcome: Progressing   Problem: Coping: Goal: Ability to adjust to condition or change in health will improve Outcome: Progressing   Problem: Fluid Volume: Goal: Ability to maintain a balanced intake and output will improve Outcome: Progressing   Problem: Health Behavior/Discharge Planning: Goal: Ability to identify and utilize available resources and services will improve Outcome: Progressing Goal: Ability to manage health-related needs will improve Outcome: Progressing   Problem: Metabolic: Goal: Ability to maintain appropriate glucose levels will improve Outcome: Progressing   Problem: Nutritional: Goal: Maintenance of adequate nutrition will improve Outcome: Progressing Goal: Progress toward achieving an optimal weight will improve Outcome: Progressing   Problem: Skin Integrity: Goal: Risk for impaired skin integrity will  decrease Outcome: Progressing   Problem: Tissue Perfusion: Goal: Adequacy of tissue perfusion will improve Outcome: Progressing

## 2022-08-03 ENCOUNTER — Inpatient Hospital Stay (HOSPITAL_COMMUNITY): Payer: BLUE CROSS/BLUE SHIELD

## 2022-08-03 DIAGNOSIS — R509 Fever, unspecified: Secondary | ICD-10-CM | POA: Diagnosis not present

## 2022-08-03 DIAGNOSIS — E870 Hyperosmolality and hypernatremia: Secondary | ICD-10-CM | POA: Diagnosis not present

## 2022-08-03 DIAGNOSIS — N390 Urinary tract infection, site not specified: Secondary | ICD-10-CM | POA: Diagnosis not present

## 2022-08-03 DIAGNOSIS — I611 Nontraumatic intracerebral hemorrhage in hemisphere, cortical: Secondary | ICD-10-CM | POA: Diagnosis not present

## 2022-08-03 LAB — GLUCOSE, CAPILLARY
Glucose-Capillary: 170 mg/dL — ABNORMAL HIGH (ref 70–99)
Glucose-Capillary: 189 mg/dL — ABNORMAL HIGH (ref 70–99)
Glucose-Capillary: 191 mg/dL — ABNORMAL HIGH (ref 70–99)
Glucose-Capillary: 200 mg/dL — ABNORMAL HIGH (ref 70–99)
Glucose-Capillary: 202 mg/dL — ABNORMAL HIGH (ref 70–99)
Glucose-Capillary: 205 mg/dL — ABNORMAL HIGH (ref 70–99)
Glucose-Capillary: 210 mg/dL — ABNORMAL HIGH (ref 70–99)

## 2022-08-03 LAB — CBC WITH DIFFERENTIAL/PLATELET
Abs Immature Granulocytes: 0.07 10*3/uL (ref 0.00–0.07)
Basophils Absolute: 0.1 10*3/uL (ref 0.0–0.1)
Basophils Relative: 1 %
Eosinophils Absolute: 0.4 10*3/uL (ref 0.0–0.5)
Eosinophils Relative: 4 %
HCT: 39.4 % (ref 36.0–46.0)
Hemoglobin: 12.6 g/dL (ref 12.0–15.0)
Immature Granulocytes: 1 %
Lymphocytes Relative: 26 %
Lymphs Abs: 2.6 10*3/uL (ref 0.7–4.0)
MCH: 28.1 pg (ref 26.0–34.0)
MCHC: 32 g/dL (ref 30.0–36.0)
MCV: 87.8 fL (ref 80.0–100.0)
Monocytes Absolute: 0.8 10*3/uL (ref 0.1–1.0)
Monocytes Relative: 8 %
Neutro Abs: 6.2 10*3/uL (ref 1.7–7.7)
Neutrophils Relative %: 60 %
Platelets: 242 10*3/uL (ref 150–400)
RBC: 4.49 MIL/uL (ref 3.87–5.11)
RDW: 14.7 % (ref 11.5–15.5)
WBC: 10.1 10*3/uL (ref 4.0–10.5)
nRBC: 0 % (ref 0.0–0.2)

## 2022-08-03 LAB — PHOSPHORUS: Phosphorus: 3.6 mg/dL (ref 2.5–4.6)

## 2022-08-03 LAB — COMPREHENSIVE METABOLIC PANEL
ALT: 96 U/L — ABNORMAL HIGH (ref 0–44)
AST: 55 U/L — ABNORMAL HIGH (ref 15–41)
Albumin: 3 g/dL — ABNORMAL LOW (ref 3.5–5.0)
Alkaline Phosphatase: 82 U/L (ref 38–126)
Anion gap: 8 (ref 5–15)
BUN: 18 mg/dL (ref 8–23)
CO2: 29 mmol/L (ref 22–32)
Calcium: 8.9 mg/dL (ref 8.9–10.3)
Chloride: 103 mmol/L (ref 98–111)
Creatinine, Ser: 0.98 mg/dL (ref 0.44–1.00)
GFR, Estimated: >60 mL/min (ref >60)
Glucose, Bld: 248 mg/dL — ABNORMAL HIGH (ref 70–99)
Potassium: 4.1 mmol/L (ref 3.5–5.1)
Sodium: 140 mmol/L (ref 135–145)
Total Bilirubin: 0.4 mg/dL (ref 0.3–1.2)
Total Protein: 6.6 g/dL (ref 6.5–8.1)

## 2022-08-03 LAB — MAGNESIUM: Magnesium: 2.2 mg/dL (ref 1.7–2.4)

## 2022-08-03 LAB — CULTURE, BLOOD (ROUTINE X 2)

## 2022-08-03 MED ORDER — INSULIN ASPART 100 UNIT/ML IJ SOLN
2.0000 [IU] | INTRAMUSCULAR | Status: DC
  Administered 2022-08-03: 2 [IU] via SUBCUTANEOUS

## 2022-08-03 MED ORDER — INSULIN ASPART 100 UNIT/ML IJ SOLN: 2.0000 [IU] | Freq: Once | INTRAMUSCULAR | Status: DC

## 2022-08-03 MED ORDER — INSULIN ASPART 100 UNIT/ML IJ SOLN
2.0000 [IU] | INTRAMUSCULAR | Status: DC
  Administered 2022-08-03 – 2022-08-12 (×43): 2 [IU] via SUBCUTANEOUS

## 2022-08-03 NOTE — Progress Notes (Signed)
Speech Language Pathology Treatment: Dysphagia  Patient Details Name: Anna Berry MRN: [id number] DOB: Oct 14, 1960 Today's Date: 08/03/2022 Time: 1478-2956 SLP Time Calculation (min) (ACUTE ONLY): 14 min  Assessment / Plan / Recommendation Clinical Impression  Pt was seen for treatment her son present. [name] son reported that the pt laughed yesterday, but that they have not noticed any increased meaningful communication. Pt was alert and inconsistently followed commands with tactile cues. Pt verbalized "ah" once with models, but no other vocalization/verbalization was noted. She tolerated ice chips, puree, regular texture solids, and thin liquids via cup and straw without coughing. Delayed subtle throat clearing was noted once, but vocal quality could not be assessed due to her impairments in language. Pt independently demonstrated bolus manipulation and swallowing. Anterior spillage noted with thin liquids, but this was reduced compared to last week. [name] prognosis for diet initiation is judged to be very good at this time, but a modified barium swallow study is recommended to ensure safety prior to diet initiation; it is planned for today at 1330.    HPI HPI: Pt is a 62 y.o. female who presented with difficulty speaking. MRI brain 5/11: Large area of hemorrhage (approximately 18 mL) in the left frontal lobe, within the anterior cerebral artery territory. Pt passed [facility] 5/11, but then had neuro changes and could not complete a repeat [facility] due to lethargy. PMH: sickle cell trait, sigmoid diverticulosis, asthma, OA; BSE completed on 07/26/22 with NPO recommendation. ST f/u for SLE and po readiness.      SLP Plan  MBS      Recommendations for follow up therapy are one component of a multi-disciplinary discharge planning process, led by the attending physician.  Recommendations may be updated based on patient status, additional functional criteria and insurance authorization.    Recommendations   Diet recommendations: NPO Medication Administration: Via alternative means                  Oral care QID;Staff/trained caregiver to provide oral care   Frequent or constant Supervision/Assistance Dysphagia, oropharyngeal phase (R13.12)     MBS    Antonya Leeder I. Vear Clock, MS, CCC-SLP Neuro Diagnostic Specialist  Acute Rehabilitation Services Office number: [phone]  Scheryl Marten  08/03/2022, 9:35 AM

## 2022-08-03 NOTE — Progress Notes (Signed)
Modified Barium Swallow Study  Patient Details  Name: Anna Berry MRN: [id number] Date of Birth: 03/27/1960  Today's Date: 08/03/2022  Modified Barium Swallow completed.  Full report located under Chart Review in the Imaging Section.  History of Present Illness Pt is a 62 y.o. female who presented with difficulty speaking. MRI brain 5/11: Large area of hemorrhage (approximately 18 mL) in the left frontal lobe, within the anterior cerebral artery territory. Pt passed Yale 5/11, but then had neuro changes and could not complete a repeat Yale due to lethargy. PMH: sickle cell trait, sigmoid diverticulosis, asthma, OA; BSE completed on 07/26/22 with NPO recommendation. ST f/u for SLE and po readiness.   Clinical Impression Pt presents with oral phase dysphagia characterized by reduced labial seal, oral holding, impaired mastication, and reduced bolus cohesion. She demonstrated anterior spillage, prolonged and disorganized mastication, oral residue, and premature spillage to the valleculae. No instances of penetration/aspiration were noted and pharyngeal clearance was WNL. A dysphagia 1 diet with thin liquids is recommended at this time. SLP will continue to follow for dysphagia treatment and prognosis for further diet advancement is judged to be good. Factors that may increase risk of adverse event in presence of aspiration Rubye Oaks & Clearance Coots 2021): Presence of tubes (ETT, trach, NG, etc.);Dependence for feeding and/or oral hygiene  Swallow Evaluation Recommendations Recommendations: PO diet PO Diet Recommendation: Dysphagia 1 (Pureed);Thin liquids (Level 0) Liquid Administration via: Cup;Straw Medication Administration: Crushed with puree (or via Cortrak) Supervision: Full supervision/cueing for swallowing strategies Swallowing strategies  : Follow solids with liquids;Small bites/sips Postural changes: Position pt fully upright for meals Oral care recommendations: Oral care BID  (2x/day)   Yosiel Thieme I. Vear Clock, MS, CCC-SLP Neuro Diagnostic Specialist  Acute Rehabilitation Services Office number: [phone]   Scheryl Marten 08/03/2022,3:13 PM

## 2022-08-03 NOTE — Progress Notes (Signed)
Occupational Therapy Treatment Patient Details Name: Anna Berry MRN: [id number] DOB: Jul 30, 1960 Today's Date: 08/03/2022   History of present illness Pt is a 62 y.o. female who presented 07/25/22 with speech difficulties. MRI showed a L frontal ICH. Worsening neuro decline noted 5/11 after admitting pt. Repeat imaging showed new area of ICH lateral to current ICH with MLS 5 mm. PMH: Sickle cell trait, sigmoid diverticulosis, asthma, OA, obesity, pre-diabetes   OT comments  Pt with son and friend present at the time of session. Pt progressed with stand pivot total +2 total (A) to chair this session with hoyer pad placed and recommendation for hoyer back to bed. Pt noted to have a posterior L lean so pillows used to assist with alignment in chair. Recommendation for Patient will benefit from intensive inpatient follow up therapy, >3 hours/day.     Recommendations for follow up therapy are one component of a multi-disciplinary discharge planning process, led by the attending physician.  Recommendations may be updated based on patient status, additional functional criteria and insurance authorization.    Assistance Recommended at Discharge Frequent or constant Supervision/Assistance  Patient can return home with the following  Two people to help with walking and/or transfers;Two people to help with bathing/dressing/bathroom;Assistance with cooking/housework;Direct supervision/assist for medications management;Assistance with feeding;Assist for transportation;Direct supervision/assist for financial management;Help with stairs or ramp for entrance   Equipment Recommendations  Wheelchair (measurements OT);Wheelchair cushion (measurements OT);Hospital bed;Other (comment) (hoyer)    Recommendations for Other Services Rehab consult    Precautions / Restrictions Precautions Precautions: Fall Precaution Comments: L wrist restraint, cortrak, SBP <169 Restrictions Weight Bearing Restrictions: No        Mobility Bed Mobility Overal bed mobility: Needs Assistance Bed Mobility: Rolling, Supine to Sit Rolling: +2 for physical assistance, Max assist   Supine to sit: +2 for physical assistance, Max assist, HOB elevated     General bed mobility comments: pt initiated sitting up with her L shoulder into a crunch position but unable to sustain the progression toward sitting. pt with (A) of pad to help progress into a static sit. Pt sitting max (A) initially and progressed with time to min (A)    Transfers Overall transfer level: Needs assistance Equipment used: 2 Divita hand held assist Transfers: Sit to/from Stand Sit to Stand: +2 physical assistance, Max assist Stand pivot transfers: +2 physical assistance, Total assist         General transfer comment: pad used and counting 1 2 3  to help initiate transfer. pt with L hand positioned to keep anterior hip flexion for sit<>Stand . pt with blocking bil LE to pivot to chair     Balance Overall balance assessment: Needs assistance Sitting-balance support: Single extremity supported, Feet supported Sitting balance-Leahy Scale: Poor   Postural control: Posterior lean, Left lateral lean Standing balance support: Single extremity supported, During functional activity, Reliant on assistive device for balance Standing balance-Leahy Scale: Zero                             ADL either performed or assessed with clinical judgement   ADL Overall ADL's : Needs assistance/impaired Eating/Feeding: NPO   Grooming: Maximal assistance;Bed level Grooming Details (indicate cue type and reason): hand over hand to initiate wipign eyes with wash cloth             Lower Body Dressing: Total assistance Lower Body Dressing Details (indicate cue type and reason): total +2 rolling R  and L for peri care . incontinence of stool . pt L LE to bridge buttock showing some awareness to presence of stool                     Extremity/Trunk Assessment Upper Extremity Assessment Upper Extremity Assessment: LUE deficits/detail RUE Deficits / Details: flaccid at this time LUE Deficits / Details: rubbing nose and face with L hand. pt scratching top of head   Lower Extremity Assessment Lower Extremity Assessment: Defer to PT evaluation RLE Deficits / Details: tone present in supine , no activation at EOB or standing        Vision   Vision Assessment?: Vision impaired- to be further tested in functional context Additional Comments: pt with R gaze preference noted and does best with therapist in central visual fields. pt closing eyes during session making further assessment challenging   Perception Perception Perception: Impaired   Praxis      Cognition Arousal/Alertness: Lethargic Behavior During Therapy: Flat affect Overall Cognitive Status: Difficult to assess                                 General Comments: pt showed a meanful response when OT offered hand to greet patient and introduce. Pt reaching with L hand and eye contact to therapist. Pt without any verbalization attempts or following command        Exercises Exercises: Other exercises Other Exercises Other Exercises: static sitting balance with reaching to help with location of midline    Shoulder Instructions       General Comments VSS on RA    Pertinent Vitals/ Pain       Pain Assessment Pain Assessment: No/denies pain  Home Living                                          Prior Functioning/Environment              Frequency  Min 2X/week        Progress Toward Goals  OT Goals(current goals can now be found in the care plan section)  Progress towards OT goals: Progressing toward goals  Acute Rehab OT Goals Patient Stated Goal: none stated OT Goal Formulation: Patient unable to participate in goal setting Time For Goal Achievement: 08/10/22 Potential to Achieve Goals: Good ADL  Goals Pt Will Perform Grooming: with mod assist;sitting Pt Will Perform Upper Body Dressing: with mod assist;sitting Pt Will Transfer to Toilet: with max assist;stand pivot transfer;bedside commode Additional ADL Goal #1: Pt will complete bed mobility with mod A  as a precursor to ADLs Additional ADL Goal #2: Pt will follow simple 1 step commands 50% of attempts to sequence through ADL task  Plan Discharge plan remains appropriate    Co-evaluation    PT/OT/SLP Co-Evaluation/Treatment: Yes Reason for Co-Treatment: Complexity of the patient's impairments (multi-system involvement);Necessary to address cognition/behavior during functional activity;For patient/therapist safety;To address functional/ADL transfers   OT goals addressed during session: ADL's and self-care;Proper use of Adaptive equipment and DME;Strengthening/ROM      AM-PAC OT "6 Clicks" Daily Activity     Outcome Measure   Help from another Thoen eating meals?: Total Help from another Mcraney taking care of personal grooming?: A Lot Help from another Mccord toileting, which includes using toliet, bedpan, or urinal?: Total Help  from another Gasparini bathing (including washing, rinsing, drying)?: Total Help from another [place] to put on and taking off regular upper body clothing?: Total Help from another Atha to put on and taking off regular lower body clothing?: Total 6 Click Score: 7    End of Session Equipment Utilized During Treatment: Gait belt  OT Visit Diagnosis: Unsteadiness on feet (R26.81);Other abnormalities of gait and mobility (R26.89);Muscle weakness (generalized) (M62.81);Hemiplegia and hemiparesis Hemiplegia - Right/Left: Right Hemiplegia - dominant/non-dominant: Dominant Hemiplegia - caused by: Other Nontraumatic intracranial hemorrhage   Activity Tolerance Patient tolerated treatment well   Patient Left in chair;with call bell/phone within reach;with family/visitor present   Nurse Communication  Mobility status;Precautions;Need for lift equipment        Time: 1105-1141 OT Time Calculation (min): 36 min  Charges: OT General Charges $OT Visit: 1 Visit OT Treatments $Self Care/Home Management : 8-22 mins   Brynn, OTR/L  Acute Rehabilitation Services Office: [phone] .   Mateo Flow 08/03/2022, 11:59 AM

## 2022-08-03 NOTE — TOC Progression Note (Signed)
Transition of Care [facility]) - Progression Note    Patient Details  Name: Anna Berry MRN: [id number] Date of Birth: 1960-05-18  [facility]) CM/SW Contact  Kermit Balo, RN Phone Number: 08/03/2022, 1:48 PM  Clinical Narrative:    Pt to have MBS. Will need nutrition plan decided prior to [facility] IR assessing.  TOC following.   Expected Discharge Plan: IP Rehab Facility Barriers to Discharge: Continued Medical Work up  Expected Discharge Plan and Services   Discharge Planning Services: CM Consult Post Acute Care Choice: IP Rehab Living arrangements for the past 2 months: Single Family Home                                       Social Determinants of Health (SDOH) Interventions SDOH Screenings   Food Insecurity: Patient Unable To Answer (07/25/2022)  Housing: Low Risk  (07/25/2022)  Transportation Needs: Patient Unable To Answer (07/25/2022)  Utilities: Patient Unable To Answer (07/25/2022)  Depression (PHQ2-9): Low Risk  (03/27/2022)  Tobacco Use: Low Risk  (07/24/2022)    Readmission Risk Interventions     No data to display

## 2022-08-03 NOTE — Inpatient Diabetes Management (Addendum)
Inpatient Diabetes Program Recommendations  AACE/ADA: New Consensus Statement on Inpatient Glycemic Control (2015)  Target Ranges:  Prepandial:   less than 140 mg/dL      Peak postprandial:   less than 180 mg/dL (1-2 hours)      Critically ill patients:  140 - 180 mg/dL   Lab Results  Component Value Date   GLUCAP 202 (H) 08/03/2022   HGBA1C 6.0 (H) 07/25/2022    Review of Glycemic Control  Latest Reference Range & Units 08/02/22 07:58 08/02/22 11:36 08/02/22 15:29 08/02/22 20:07 08/02/22 23:57 08/03/22 03:11 08/03/22 09:03  Glucose-Capillary 70 - 99 mg/dL 161 (H) 096 (H) 045 (H) 214 (H) 200 (H) 191 (H) 202 (H)   Diabetes history: None Outpatient Diabetes medications: none Current orders for Inpatient glycemic control:  Novolog 0-9 units Q4 hours  A1c 6% on 5/11 Jevity 50 ml/hour  Inpatient Diabetes Program Recommendations:    -   Start Novolog 2 units Q4 hours Tube Feed Coverage  Thanks,  Christena Deem RN, MSN, BC-ADM Inpatient Diabetes Coordinator Team Pager [phone] (8a-5p)

## 2022-08-03 NOTE — Progress Notes (Signed)
Physical Therapy Treatment Patient Details Name: Anna Berry MRN: [id number] DOB: 05-18-1960 Today's Date: 08/03/2022   History of Present Illness Pt is a 62 y.o. female who presented 07/25/22 with speech difficulties. MRI showed a L frontal ICH. Worsening neuro decline noted 5/11 after admitting pt. Repeat imaging showed new area of ICH lateral to current ICH with MLS 5 mm. PMH: Sickle cell trait, sigmoid diverticulosis, asthma, OA, obesity, pre-diabetes    PT Comments    Pt received in bed with visitors present and pt awake but sleepy. Seen by PT and OT to maximize function and pt participation. Pt rolled each way in bed prior to sitting up for bowel clean up, able to hold self on R side with use of rail as well as maintain L hip abduction with min A. Pt following about 50% of simple instructional commands initially but decreased as she fatigued and pt not attempting to verbalize. Pt sat EOB with mod A initially, progressing to min A. Pt performed SPT to chair with max A +2, bilateral knees buckling. Patient will benefit from intensive inpatient follow up therapy, >3 hours/day. PT will continue to follow.     Recommendations for follow up therapy are one component of a multi-disciplinary discharge planning process, led by the attending physician.  Recommendations may be updated based on patient status, additional functional criteria and insurance authorization.  Follow Up Recommendations       Assistance Recommended at Discharge Frequent or constant Supervision/Assistance  Patient can return home with the following Two people to help with walking and/or transfers;Two people to help with bathing/dressing/bathroom;Assistance with feeding;Assistance with cooking/housework;Direct supervision/assist for medications management;Direct supervision/assist for financial management;Assist for transportation;Help with stairs or ramp for entrance   Equipment Recommendations  Hospital bed;Other  (comment) (hoyer lift, tilt in space wheelchair)    Recommendations for Other Services Rehab consult     Precautions / Restrictions Precautions Precautions: Fall Precaution Comments: L wrist restraint, cortrak, SBP <169 Restrictions Weight Bearing Restrictions: No     Mobility  Bed Mobility Overal bed mobility: Needs Assistance Bed Mobility: Rolling, Supine to Sit Rolling: +2 for physical assistance, Max assist   Supine to sit: +2 for physical assistance, Max assist, HOB elevated     General bed mobility comments: Pt able to hold self on R side with use of LUE on bedrail during bowel cleanup. Pt initiated sitting up with her L shoulder into a crunch position but unable to sustain the progression toward sitting. pt with (A) of pad to help progress into a static sit. Pt sitting max (A) initially and progressed with time to min (A)    Transfers Overall transfer level: Needs assistance Equipment used: 2 Vancleve hand held assist Transfers: Sit to/from Stand Sit to Stand: +2 physical assistance, Max assist Stand pivot transfers: +2 physical assistance, Total assist         General transfer comment: pad used and counting 1 2 3  to help initiate transfer. pt with L hand positioned to keep anterior hip flexion for sit<>Stand . pt with blocking bil LE to pivot to chair. Pt with less initiation of L side with sit>stand than expected.    Ambulation/Gait               General Gait Details: unable, B knees buckling in standing   Stairs             Wheelchair Mobility    Modified Rankin (Stroke Patients Only) Modified Rankin (Stroke Patients Only) Pre-Morbid Rankin  Score: No symptoms Modified Rankin: Severe disability     Balance Overall balance assessment: Needs assistance Sitting-balance support: Single extremity supported, Feet supported Sitting balance-Leahy Scale: Poor Sitting balance - Comments: Pt able to maintain midline sitting today with initial lean but  improved with time up. Postural control: Posterior lean, Left lateral lean Standing balance support: Single extremity supported, During functional activity Standing balance-Leahy Scale: Zero Standing balance comment: max A +2 to maintain standing                            Cognition Arousal/Alertness: Lethargic Behavior During Therapy: Flat affect Overall Cognitive Status: Difficult to assess Area of Impairment: Following commands                   Current Attention Level: Focused   Following Commands: Follows one step commands inconsistently   Awareness: Intellectual   General Comments: pt showed a meanful response when OT offered hand to greet patient and introduce. Pt reaching with L hand and eye contact to therapist. Pt without any verbalization attempts or following other command        Exercises Other Exercises Other Exercises: static sitting balance with reaching to help with location of midline    General Comments General comments (skin integrity, edema, etc.): VSS on RA. Friend present during session. R prevalon boot donned while in chair      Pertinent Vitals/Pain Pain Assessment Pain Assessment: No/denies pain Faces Pain Scale: No hurt    Home Living                          Prior Function            PT Goals (current goals can now be found in the care plan section) Acute Rehab PT Goals Patient Stated Goal: did not state; family hopes for pt to improve PT Goal Formulation: With family Time For Goal Achievement: 01/20/23 Potential to Achieve Goals: Good Progress towards PT goals: Progressing toward goals    Frequency    Min 4X/week      PT Plan Current plan remains appropriate    Co-evaluation PT/OT/SLP Co-Evaluation/Treatment: Yes Reason for Co-Treatment: Complexity of the patient's impairments (multi-system involvement);Necessary to address cognition/behavior during functional activity;For patient/therapist  safety;To address functional/ADL transfers PT goals addressed during session: Mobility/safety with mobility;Balance;Strengthening/ROM OT goals addressed during session: ADL's and self-care;Proper use of Adaptive equipment and DME;Strengthening/ROM      AM-PAC PT "6 Clicks" Mobility   Outcome Measure  Help needed turning from your back to your side while in a flat bed without using bedrails?: Total Help needed moving from lying on your back to sitting on the side of a flat bed without using bedrails?: A Lot Help needed moving to and from a bed to a chair (including a wheelchair)?: Total Help needed standing up from a chair using your arms (e.g., wheelchair or bedside chair)?: Total Help needed to walk in hospital room?: Total Help needed climbing 3-5 steps with a railing? : Total 6 Click Score: 7    End of Session Equipment Utilized During Treatment: Gait belt Activity Tolerance: Patient tolerated treatment well Patient left: with call bell/phone within reach;with restraints reapplied;in chair;with chair alarm set;with family/visitor present (lift pad under pt) Nurse Communication: Mobility status;Need for lift equipment PT Visit Diagnosis: Unsteadiness on feet (R26.81);Muscle weakness (generalized) (M62.81);Difficulty in walking, not elsewhere classified (R26.2);Other symptoms and signs involving the nervous  system (R29.898);Hemiplegia and hemiparesis Hemiplegia - Right/Left: Right Hemiplegia - dominant/non-dominant: Dominant Hemiplegia - caused by: Other Nontraumatic intracranial hemorrhage     Time: 1610-9604 PT Time Calculation (min) (ACUTE ONLY): 37 min  Charges:  $Therapeutic Activity: 8-22 mins                     Lyanne Co, PT  Acute Rehab Services Secure chat preferred Office [phone]    Anna Berry Anna Berry 08/03/2022, 1:21 PM

## 2022-08-03 NOTE — Progress Notes (Signed)
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 Per report she drove from at 5 PM walked into her house and had multiple concerns. Family called EMS and she was brought in as a code stroke. She is able to tell admitting physician her name and age and current month her voice is very hypophonic. 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 She was transferred out of the intensive care unit to the tried hospitalist service on 07/29/2022.    The day that she was transferred to the [facility] service she spiked a temperature so further workup is being done.  Nursing states that she is starting have some diarrhea so we will check her for an infectious etiology.  Currently unclear etiology of her fevers.  Has a Cortrak in place and will continue for now and continue SLP.   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 distribution with concern for hemorrhagic transformation and then had a new left frontal ICH lateral to the previous hematoma with unclear etiology -MRI and repeat CT done -CTV showed no venous thrombosis -Echocardiogram done and showed a normal EF of 50 to 55% with no regional wall motion abnormalities and normal left atrial size -EEG done and showed no seizure -Hemoglobin A1c was 6.0 and LDL was 105 -Not on any antithrombotics due to ICH -He is on a statin now -PT OT recommending Inpatient Rehab but does not have insurance  coverage for inpatient rehab here and cannot go to rehab with Cortrak so will need to either be able to tolerate a diet or will require a PEG; Will follow on SLP re-evalaution and will need PEG likely  -Neurology following recommending allowing for the blood to be better absorbed and planning a repeat head CT in about 4 weeks and if this is stable and improved consider antiplatelet therapy at that time -Following some commands on the Left and son says she chuckled yesterday -C/w Therapy.    Elevated blood pressure -Weaned off of Cleviprex -Given that her repeat CT scan on 530 was stable the neurologist physical extra blood pressure goal to 130s to 150s systolic and continues recommend long-term blood pressure goal being normotensive -Currently has labetalol 10 mg IV every 2 as needed as needed for SBP above 150 -Last BP was 142/87   Hyperlipidemia -Patient had a total cholesterol/HDL ratio of 3.3, cholesterol level 164, HDL of 49, LDL 105, triglycerides of 52, VLDL of 10 -Neurology is initiated the patient on a statin rosuvastatin 20 mg per tube daily   Dysphagia -Did not pass her swallow screen and remains n.p.o. for now -Speech therapy consulted and following for SLE and p.o. readiness -Has a small bore cortrak TF currently and SLP evaluating and recommending NPO status to be maintained and will pursue an MBS but deferred yesterday due to Radiology Schedule; -MBS done today at 1330 and showed she is found to have no instances of penetration or aspiration noted and prior to clearance was within  normal limits so dysphagia 1 diet with thin liquids recommended this time -Have consulted dietitian for calorie count so we can try and remove the Cortrak   Hypernatremia -Na+ Trend: Recent Labs  Lab 07/28/22 0305 07/29/22 0740 07/30/22 0429 07/31/22 1411 08/01/22 0408 08/02/22 0440 08/03/22 0530  NA 149* 151* 146* 148* 150* 145 140  -IVF has now stopped -Continue Free Water  Flushes -Repeat CMP in the AM    Fever in the setting of UTI -Unclear Etiology -Spiked a Temperature of 100.4 the day she was transferred to [facility] and went up to 101.2; No further fevers now -Pan Culture the Patient -WBC and Lactic Acid Trend: Recent Labs  Lab 07/28/22 0305 07/29/22 0740 07/29/22 1204 07/30/22 0429 07/30/22 0849 07/30/22 1149 07/31/22 1411 08/01/22 0408 08/02/22 0440 08/03/22 0530  WBC 9.4 11.7*  --  13.0*  --   --  10.9* 10.3 9.5 10.1  LATICACIDVEN  --   --    < >  --    < > 1.4  --   --   --   --    < > = values in this interval not displayed.  -PCT Negative at <0.10 -Checking blood cultures x 2 and showing NGTD < 24 hours -Urinalysis done and showed a hazy appearance with moderate hemoglobin, moderate leukocytes, negative nitrites, many bacteria, 11-20 RBCs per high-power field, limited 20 WBCs and will send for urine culture before initiating antibiotics; Urine Cx showing >100,000 of E Coli and 20,000 CFU of Klebsiella Pneumoniae; Sensitivities for E Coli and Klebsiella Sensitive to Ceftriaxone (Will treat for 7 days total)  -Chest x-ray done and showed "There is a left arm PICC line with tip at the superior cavoatrial junction. Feeding tube courses below the level of the GE junction. Heart size appears normal. No pleural fluid or airspace disease. The visualized osseous structures are unremarkable." -IVF had stopped but will resume again -GI Pathogen panel negative    Abnormal LFTS -In the setting of TF? -Mild and LFT Trend: Recent Labs  Lab 07/24/22 2035 07/29/22 0740 07/30/22 0429 07/31/22 1411 08/01/22 0408 08/02/22 0440 08/03/22 0530  AST 27 35 26 45* 34 47* 55*  ALT 19 24 31  63* 60* 74* 96*  -Continue to Monitor and Trend and repeat CMP in the AM    Lactic Acidosis -Lactic acid level trend: Recent Labs  Lab 07/29/22 1204 07/29/22 1506 07/30/22 0849 07/30/22 1149  LATICACIDVEN 2.3* 3.2* 1.5 1.4  -IVF with Free Water Flushes now stopped.  Continue on Free Water Flushes -Will not repeat now improved   GERD/GI prophylaxis -Continue with Pantoprazole 40 mg IV nightly   Hypoalbuminemia -Patient's Albumin Trend Recent Labs  Lab 07/24/22 2035 07/29/22 0740 07/30/22 0429 07/31/22 1411 08/01/22 0408 08/02/22 0440 08/03/22 0530  ALBUMIN 4.2 4.1 3.8 3.6 3.3* 3.0* 3.0*  -Continue to Monitor and Trend and repeat CMP in the AM   Obesity -Complicates overall prognosis and care -Estimated body mass index is 32.02 kg/m as calculated from the following:   Height as of this encounter: 5\' 6"  (1.676 m).   Weight as of this encounter: 90 kg.  -Weight Loss and Dietary Counseling given will be given once more awake  DVT prophylaxis: heparin injection 5,000 Units Start: 07/27/22 1300 SCD's Start: 07/25/22 0359    Code Status: Full Code Family Communication: Discussed with son at bedside   Disposition Plan:  Level of care: Telemetry Medical Status is: Inpatient Remains inpatient appropriate because: Needs further clinical Improvement and  will need to go to CIR but will need to see about Nutrition   Consultants:  Neurology  Procedures:  As delineated as above  Antimicrobials:  Anti-infectives (From admission, onward)    Start     Dose/Rate Route Frequency Ordered Stop   07/31/22 1600  cefTRIAXone (ROCEPHIN) 1 g in sodium chloride 0.9 % 100 mL IVPB        1 g 200 mL/hr over 30 Minutes Intravenous Every 24 hours 07/31/22 1503 08/06/22 2359       Subjective: Seen and examined at bedside and was little somnolent and drowsy.  Does follow some commands and son states that she had some spontaneous movement on the right arm yesterday.  Continues not to to be able to communicate given her global aphasia and continues to have a Cortrak.  Son states that she did very well with the speech therapist this morning and did an MBS.   Objective: Vitals:   08/03/22 0521 08/03/22 0856 08/03/22 1302 08/03/22 1625  BP: (!) 147/85 134/85  120/82 (!) 142/87  Pulse: 99 88 96 99  Resp: 16 17 20 18   Temp: 98.3 F (36.8 C) 98.2 F (36.8 C) 98.2 F (36.8 C) 98.6 F (37 C)  TempSrc:  Oral Oral Oral  SpO2: 96% 96% 99% 100%  Weight:      Height:        Intake/Output Summary (Last 24 hours) at 08/03/2022 1700 Last data filed at 08/03/2022 1331 Gross per 24 hour  Intake --  Output 2150 ml  Net -2150 ml   Filed Weights   08/01/22 0500 08/02/22 0500 08/03/22 0500  Weight: 88.5 kg 90.7 kg 90 kg   Examination: Physical Exam:  Constitutional: WN/WD obese AAF who is a sleepy and continues not to interact given global aphasia and has Right Hemiplegia Respiratory: Diminished to auscultation bilaterally with coarse breath sounds, no wheezing, rales, rhonchi or crackles. Normal respiratory effort and patient is not tachypenic. No accessory muscle use. Unlabored breathing  Cardiovascular: RRR, no murmurs / rubs / gallops. S1 and S2 auscultated. No extremity edema. Abdomen: Soft, non-tender, distended 2/2 body habitus. Bowel sounds positive.  GU: Deferred. Musculoskeletal: No clubbing / cyanosis of digits/nails. No joint deformity upper and lower extremities.  Skin: No rashes, lesions, ulcers on a limited skin evaluation. No induration; Warm and dry.  Neurologic: Is sleepy but arouses. Has dense hemiplegia on the Right.  Psychiatric: Appears calm and somnolent and drowsy   Data Reviewed: I have personally reviewed following labs and imaging studies  CBC: Recent Labs  Lab 07/30/22 0429 07/31/22 1411 08/01/22 0408 08/02/22 0440 08/03/22 0530  WBC 13.0* 10.9* 10.3 9.5 10.1  NEUTROABS 9.2* 7.6 6.7 5.9 6.2  HGB 14.1 14.0 14.0 12.8 12.6  HCT 43.6 44.0 43.8 40.6 39.4  MCV 86.7 88.2 88.0 89.2 87.8  PLT 248 248 254 245 242   Basic Metabolic Panel: Recent Labs  Lab 07/30/22 0429 07/31/22 1411 08/01/22 0408 08/02/22 0440 08/03/22 0530  NA 146* 148* 150* 145 140  K 3.9 4.3 4.2 3.9 4.1  CL 111 111 109 107 103  CO2 26 26 30  30 29   GLUCOSE 193* 178* 168* 214* 248*  BUN 17 30* 28* 23 18  CREATININE 1.09* 1.18* 1.18* 1.12* 0.98  CALCIUM 9.3 9.2 9.3 9.0 8.9  MG 2.6* 2.8* 3.0* 2.7* 2.2  PHOS 3.8 4.3 4.5 4.0 3.6   GFR: Estimated Creatinine Clearance: 66.4 mL/min (by C-G formula based on SCr of 0.98 mg/dL). Liver  Function Tests: Recent Labs  Lab 07/30/22 0429 07/31/22 1411 08/01/22 0408 08/02/22 0440 08/03/22 0530  AST 26 45* 34 47* 55*  ALT 31 63* 60* 74* 96*  ALKPHOS 77 76 82 83 82  BILITOT 0.6 0.4 0.5 0.7 0.4  PROT 7.6 7.3 7.0 6.5 6.6  ALBUMIN 3.8 3.6 3.3* 3.0* 3.0*   No results for input(s): "LIPASE", "AMYLASE" in the last 168 hours. No results for input(s): "AMMONIA" in the last 168 hours. Coagulation Profile: No results for input(s): "INR", "PROTIME" in the last 168 hours. Cardiac Enzymes: No results for input(s): "CKTOTAL", "CKMB", "CKMBINDEX", "TROPONINI" in the last 168 hours. BNP (last 3 results) No results for input(s): "PROBNP" in the last 8760 hours. HbA1C: No results for input(s): "HGBA1C" in the last 72 hours. CBG: Recent Labs  Lab 08/02/22 2357 08/03/22 0311 08/03/22 0903 08/03/22 1309 08/03/22 1629  GLUCAP 200* 191* 202* 210* 205*   Lipid Profile: No results for input(s): "CHOL", "HDL", "LDLCALC", "TRIG", "CHOLHDL", "LDLDIRECT" in the last 72 hours. Thyroid Function Tests: No results for input(s): "TSH", "T4TOTAL", "FREET4", "T3FREE", "THYROIDAB" in the last 72 hours. Anemia Panel: No results for input(s): "VITAMINB12", "FOLATE", "FERRITIN", "TIBC", "IRON", "RETICCTPCT" in the last 72 hours. Sepsis Labs: Recent Labs  Lab 07/29/22 0740 07/29/22 1204 07/29/22 1506 07/30/22 0849 07/30/22 1149  PROCALCITON <0.10  --   --   --   --   LATICACIDVEN  --  2.3* 3.2* 1.5 1.4   Recent Results (from the past 240 hour(s))  MRSA Next Gen by PCR, Nasal     Status: None   Collection Time: 07/25/22  5:46 AM   Specimen: Nasal Mucosa; Nasal Swab  Result Value Ref Range Status    MRSA by PCR Next Gen NOT DETECTED NOT DETECTED Final    Comment: (NOTE) The GeneXpert MRSA Assay (FDA approved for NASAL specimens only), is one component of a comprehensive MRSA colonization surveillance program. It is not intended to diagnose MRSA infection nor to guide or monitor treatment for MRSA infections. Test performance is not FDA approved in patients less than 59 years old. Performed at [facility], [address] [place], [place] [address]   Culture, blood (Routine X 2) w Reflex to ID Panel     Status: None   Collection Time: 07/29/22 12:04 PM   Specimen: BLOOD RIGHT HAND  Result Value Ref Range Status   Specimen Description BLOOD RIGHT HAND  Final   Special Requests   Final    BOTTLES DRAWN AEROBIC AND ANAEROBIC Blood Culture adequate volume   Culture   Final    NO GROWTH 5 DAYS Performed at [facility], [address], [place], [place] [address]    Report Status 08/03/2022 FINAL  Final  Culture, blood (Routine X 2) w Reflex to ID Panel     Status: None   Collection Time: 07/29/22 12:04 PM   Specimen: BLOOD RIGHT ARM  Result Value Ref Range Status   Specimen Description BLOOD RIGHT ARM  Final   Special Requests   Final    BOTTLES DRAWN AEROBIC AND ANAEROBIC Blood Culture results may not be optimal due to an inadequate volume of blood received in culture bottles   Culture   Final    NO GROWTH 5 DAYS Performed at [facility], [address], [place], [place] [address]    Report Status 08/03/2022 FINAL  Final  Urine Culture (for pregnant, neutropenic or urologic patients or patients with an indwelling  urinary catheter)     Status: Abnormal   Collection Time: 07/29/22  6:08 PM   Specimen: Urine, Clean Catch  Result Value Ref Range Status   Specimen Description URINE, CLEAN CATCH  Final   Special Requests   Final    NONE Performed at [facility], [address] [place], [place] [address]    Culture (A)  Final    >=100,000 COLONIES/mL  ESCHERICHIA COLI 20,000 COLONIES/mL KLEBSIELLA PNEUMONIAE    Report Status 08/02/2022 FINAL  Final   Organism ID, Bacteria ESCHERICHIA COLI (A)  Final   Organism ID, Bacteria KLEBSIELLA PNEUMONIAE (A)  Final      Susceptibility   Escherichia coli - MIC*    AMPICILLIN >=32 RESISTANT Resistant     CEFAZOLIN <=4 SENSITIVE Sensitive     CEFEPIME <=0.12 SENSITIVE Sensitive     CEFTRIAXONE <=0.25 SENSITIVE Sensitive     CIPROFLOXACIN >=4 RESISTANT Resistant     GENTAMICIN 4 SENSITIVE Sensitive     IMIPENEM <=0.25 SENSITIVE Sensitive     NITROFURANTOIN <=16 SENSITIVE Sensitive     TRIMETH/SULFA <=20 SENSITIVE Sensitive     AMPICILLIN/SULBACTAM >=32 RESISTANT Resistant     PIP/TAZO <=4 SENSITIVE Sensitive     * >=100,000 COLONIES/mL ESCHERICHIA COLI   Klebsiella pneumoniae - MIC*    AMPICILLIN >=32 RESISTANT Resistant     CEFAZOLIN <=4 SENSITIVE Sensitive     CEFEPIME <=0.12 SENSITIVE Sensitive     CEFTRIAXONE <=0.25 SENSITIVE Sensitive     CIPROFLOXACIN <=0.25 SENSITIVE Sensitive     GENTAMICIN <=1 SENSITIVE Sensitive     IMIPENEM <=0.25 SENSITIVE Sensitive     NITROFURANTOIN 64 INTERMEDIATE Intermediate     TRIMETH/SULFA <=20 SENSITIVE Sensitive     AMPICILLIN/SULBACTAM 4 SENSITIVE Sensitive     PIP/TAZO <=4 SENSITIVE Sensitive     * 20,000 COLONIES/mL KLEBSIELLA PNEUMONIAE  Gastrointestinal Panel by PCR , Stool     Status: None   Collection Time: 07/30/22  8:34 AM   Specimen: Stool  Result Value Ref Range Status   Campylobacter species NOT DETECTED NOT DETECTED Final   Plesimonas shigelloides NOT DETECTED NOT DETECTED Final   Salmonella species NOT DETECTED NOT DETECTED Final   Yersinia enterocolitica NOT DETECTED NOT DETECTED Final   Vibrio species NOT DETECTED NOT DETECTED Final   Vibrio cholerae NOT DETECTED NOT DETECTED Final   Enteroaggregative E coli (EAEC) NOT DETECTED NOT DETECTED Final   Enteropathogenic E coli (EPEC) NOT DETECTED NOT DETECTED Final   Enterotoxigenic  E coli (ETEC) NOT DETECTED NOT DETECTED Final   Shiga like toxin producing E coli (STEC) NOT DETECTED NOT DETECTED Final   Shigella/Enteroinvasive E coli (EIEC) NOT DETECTED NOT DETECTED Final   Cryptosporidium NOT DETECTED NOT DETECTED Final   Cyclospora cayetanensis NOT DETECTED NOT DETECTED Final   Entamoeba histolytica NOT DETECTED NOT DETECTED Final   Giardia lamblia NOT DETECTED NOT DETECTED Final   Adenovirus F40/41 NOT DETECTED NOT DETECTED Final   Astrovirus NOT DETECTED NOT DETECTED Final   Norovirus GI/GII NOT DETECTED NOT DETECTED Final   Rotavirus A NOT DETECTED NOT DETECTED Final   Sapovirus (I, II, IV, and V) NOT DETECTED NOT DETECTED Final    Comment: Performed at [facility], [address], [place], [place] [address]    Radiology Studies: DG Swallowing Func-Speech Pathology  Result Date: 08/03/2022 Table formatting from the original result was not included. Modified Barium Swallow Study Patient Details Name: Jenelly Hubbartt Baldwin MRN: [id number] Date of Birth: Oct 29, 1960  Today's Date: 08/03/2022 HPI/PMH: HPI: Pt is a 62 y.o. female who presented with difficulty speaking. MRI brain 5/11: Large area of hemorrhage (approximately 18 mL) in the left frontal lobe, within the anterior cerebral artery territory. Pt passed [facility] 5/11, but then had neuro changes and could not complete a repeat [facility] due to lethargy. PMH: sickle cell trait, sigmoid diverticulosis, asthma, OA; BSE completed on 07/26/22 with NPO recommendation. ST f/u for SLE and po readiness. Clinical Impression: Clinical Impression: Pt presents with oral phase dysphagia characterized by reduced labial seal, oral holding, impaired mastication, and reduced bolus cohesion. She demonstrated anterior spillage, prolonged and disorganized mastication, oral residue, and premature spillage to the valleculae. No instances of penetration/aspiration were noted and pharyngeal clearance was WNL. A dysphagia 1 diet with thin liquids is  recommended at this time. SLP will continue to follow for dysphagia treatment and prognosis for further diet advancement is judged to be good. Factors that may increase risk of adverse event in presence of aspiration Rubye Oaks & Clearance Coots 2021): Factors that may increase risk of adverse event in presence of aspiration Rubye Oaks & Clearance Coots 2021): Presence of tubes (ETT, trach, NG, etc.); Dependence for feeding and/or oral hygiene Recommendations/Plan: Swallowing Evaluation Recommendations Swallowing Evaluation Recommendations Recommendations: PO diet PO Diet Recommendation: Dysphagia 1 (Pureed); Thin liquids (Level 0) Liquid Administration via: Cup; Straw Medication Administration: Crushed with puree (or via Cortrak) Supervision: Full supervision/cueing for swallowing strategies Swallowing strategies  : Follow solids with liquids; Small bites/sips Postural changes: Position pt fully upright for meals Oral care recommendations: Oral care BID (2x/day) Treatment Plan Treatment Plan Treatment recommendations: Therapy as outlined in treatment plan below Follow-up recommendations: Acute inpatient rehab (3 hours/day) Functional status assessment: Patient has had a recent decline in their functional status and demonstrates the ability to make significant improvements in function in a reasonable and predictable amount of time. Treatment frequency: Min 2x/week Treatment duration: 2 weeks Interventions: Trials of upgraded texture/liquids; Diet toleration management by SLP Recommendations Recommendations for follow up therapy are one component of a multi-disciplinary discharge planning process, led by the attending physician.  Recommendations may be updated based on patient status, additional functional criteria and insurance authorization. Assessment: Orofacial Exam: Orofacial Exam Oral Cavity: Oral Hygiene: WFL Oral Cavity - Dentition: Adequate natural dentition Anatomy: Anatomy: WFL Boluses Administered: Boluses Administered Boluses  Administered: Thin liquids (Level 0); Mildly thick liquids (Level 2, nectar thick); Moderately thick liquids (Level 3, honey thick); Puree; Solid  Oral Impairment Domain: Oral Impairment Domain Lip Closure: Escape beyond mid-chin Tongue control during bolus hold: Escape to lateral buccal cavity/floor of mouth; Posterior escape of less than half of bolus Bolus preparation/mastication: Disorganized chewing/mashing with solid pieces of bolus unchewed  Pharyngeal Impairment Domain: Pharyngeal Impairment Domain Soft palate elevation: No bolus between soft palate (SP)/pharyngeal wall (PW) Laryngeal elevation: Complete superior movement of thyroid cartilage with complete approximation of arytenoids to epiglottic petiole Anterior hyoid excursion: Complete anterior movement Epiglottic movement: Complete inversion Laryngeal vestibule closure: Complete, no air/contrast in laryngeal vestibule Pharyngeal stripping wave : Present - complete Pharyngeal contraction (A/P view only): N/A Pharyngoesophageal segment opening: Complete distension and complete duration, no obstruction of flow Tongue base retraction: Trace column of contrast or air between tongue base and PPW Pharyngeal residue: Trace residue within or on pharyngeal structures Location of pharyngeal residue: Valleculae; Pyriform sinuses  Esophageal Impairment Domain: Esophageal Impairment Domain Esophageal clearance upright position: Complete clearance, esophageal coating Pill: Esophageal Impairment Domain Esophageal clearance upright position: Complete clearance, esophageal coating Penetration/Aspiration Scale Score:  Penetration/Aspiration Scale Score 1.  Material does not enter airway: Thin liquids (Level 0); Mildly thick liquids (Level 2, nectar thick); Moderately thick liquids (Level 3, honey thick); Puree; Solid; Pill Compensatory Strategies: Compensatory Strategies Compensatory strategies: No   General Information: Caregiver present: No  Diet Prior to this Study:  NPO; Cortrak/Small bore NG tube   Temperature : Normal   Respiratory Status: WFL   Supplemental O2: None (Room air)   History of Recent Intubation: No  Behavior/Cognition: Alert; Cooperative; Doesn't follow directions; Requires cueing Self-Feeding Abilities: Able to self-feed Baseline vocal quality/speech: Not observed Volitional Cough: Unable to elicit Volitional Swallow: Able to elicit Exam Limitations: -- (Difficulty following commands) Goal Planning: Prognosis for improved oropharyngeal function: Good Barriers to Reach Goals: Language deficits No data recorded Patient/Family Stated Goal: none stated No data recorded Pain: Pain Assessment Pain Assessment: No/denies pain Faces Pain Scale: 0 Breathing: 0 Negative Vocalization: 0 Facial Expression: 0 Body Language: 0 Consolability: 0 PAINAD Score: 0 Facial Expression: 0 Body Movements: 0 Muscle Tension: 0 Compliance with ventilator (intubated pts.): N/A Vocalization (extubated pts.): N/A CPOT Total: 0 Pain Location: grimacing with noxious stimuli/movement of cortrak, redness at L eye Pain Descriptors / Indicators: Discomfort; Grimacing; Crying Pain Intervention(s): Monitored during session End of Session: Start Time:SLP Start Time (ACUTE ONLY): 1340 Stop Time: SLP Stop Time (ACUTE ONLY): 1358 Time Calculation:SLP Time Calculation (min) (ACUTE ONLY): 18 min Charges: SLP Evaluations $ SLP Speech Visit: 1 Visit SLP Evaluations $BSS Swallow: 1 Procedure $MBS Swallow: 1 Procedure $ SLP EVAL LANGUAGE/SOUND PRODUCTION: 1 Procedure $Swallowing Treatment: 1 Procedure SLP visit diagnosis: SLP Visit Diagnosis: Dysphagia, oral phase (R13.11) Past Medical History: Past Medical History: Diagnosis Date  Allergy   Anemia   Arthritis   Asthma   allergies induced  Bilateral lower extremity edema   Depression   Fibroids 08/2002  Gross hematuria 05/2010  History of appendicitis   History of colon polyps 2017  Multiple renal cysts 05/24/2008  Two left renal parapelvic cysts.  OA  (osteoarthritis) of knee   Right  Obesity   Plantar fasciitis   Postmenopausal   Pre-diabetes   Renal papillary necrosis (HCC)   Seasonal allergies   Sickle cell anemia (HCC)   sickle cell trait  Sickle cell trait (HCC)   Sigmoid diverticulosis 09/26/2015  Noted on colonoscopy Past Surgical History: Past Surgical History: Procedure Laterality Date  ABDOMINAL HYSTERECTOMY  2004  APPENDECTOMY  2006  CESAREAN SECTION  1981, 1987  x2  COLONOSCOPY    COLONOSCOPY W/ POLYPECTOMY  09/26/2015  POLYPECTOMY    TOTAL KNEE ARTHROPLASTY Right 07/29/2018  Procedure: RIGHT TOTAL KNEE ARTHROPLASTY;  Surgeon: Tarry Kos, MD;  Location: [facility];  Service: Orthopedics;  Laterality: Right; Shanika I. Vear Clock, MS, CCC-SLP Neuro Diagnostic Specialist Acute Rehabilitation Services Office number: [phone] Scheryl Marten 08/03/2022, 3:16 PM   Scheduled Meds:  Chlorhexidine Gluconate Cloth  6 each Topical Q0600   feeding supplement (PROSource TF20)  60 mL Per Tube Daily   free water  200 mL Per Tube Q4H   heparin injection (subcutaneous)  5,000 Units Subcutaneous Q8H   insulin aspart  0-9 Units Subcutaneous Q4H   mouth rinse  15 mL Mouth Rinse 4 times per day   pantoprazole (PROTONIX) IV  40 mg Intravenous QHS   polyethylene glycol  17 g Per Tube BID   rosuvastatin  20 mg Per Tube Daily   senna-docusate  1 tablet Per Tube BID   sodium chloride flush  10-40 mL Intracatheter  Q12H   Continuous Infusions:  cefTRIAXone (ROCEPHIN)  IV 1 g (08/03/22 1615)   feeding supplement (JEVITY 1.5 CAL/FIBER) 1,000 mL (08/03/22 1212)    LOS: 9 days   Marguerita Merles, DO [facility] Available via Epic secure chat 7am-7pm After these hours, please refer to coverage provider listed on [email or web address] 08/03/2022, 5:00 PM

## 2022-08-04 ENCOUNTER — Inpatient Hospital Stay (HOSPITAL_COMMUNITY): Payer: BLUE CROSS/BLUE SHIELD

## 2022-08-04 DIAGNOSIS — N390 Urinary tract infection, site not specified: Secondary | ICD-10-CM | POA: Diagnosis not present

## 2022-08-04 DIAGNOSIS — R509 Fever, unspecified: Secondary | ICD-10-CM | POA: Diagnosis not present

## 2022-08-04 DIAGNOSIS — E870 Hyperosmolality and hypernatremia: Secondary | ICD-10-CM | POA: Diagnosis not present

## 2022-08-04 DIAGNOSIS — I611 Nontraumatic intracerebral hemorrhage in hemisphere, cortical: Secondary | ICD-10-CM | POA: Diagnosis not present

## 2022-08-04 LAB — CBC WITH DIFFERENTIAL/PLATELET
Abs Immature Granulocytes: 0.09 10*3/uL — ABNORMAL HIGH (ref 0.00–0.07)
Basophils Absolute: 0.1 10*3/uL (ref 0.0–0.1)
Basophils Relative: 1 %
Eosinophils Absolute: 0.4 10*3/uL (ref 0.0–0.5)
Eosinophils Relative: 3 %
HCT: 38.3 % (ref 36.0–46.0)
Hemoglobin: 12.5 g/dL (ref 12.0–15.0)
Immature Granulocytes: 1 %
Lymphocytes Relative: 23 %
Lymphs Abs: 2.6 10*3/uL (ref 0.7–4.0)
MCH: 27.8 pg (ref 26.0–34.0)
MCHC: 32.6 g/dL (ref 30.0–36.0)
MCV: 85.1 fL (ref 80.0–100.0)
Monocytes Absolute: 1 10*3/uL (ref 0.1–1.0)
Monocytes Relative: 9 %
Neutro Abs: 7.1 10*3/uL (ref 1.7–7.7)
Neutrophils Relative %: 63 %
Platelets: 231 10*3/uL (ref 150–400)
RBC: 4.5 MIL/uL (ref 3.87–5.11)
RDW: 14.7 % (ref 11.5–15.5)
WBC: 11.3 10*3/uL — ABNORMAL HIGH (ref 4.0–10.5)
nRBC: 0 % (ref 0.0–0.2)

## 2022-08-04 LAB — GLUCOSE, CAPILLARY
Glucose-Capillary: 116 mg/dL — ABNORMAL HIGH (ref 70–99)
Glucose-Capillary: 125 mg/dL — ABNORMAL HIGH (ref 70–99)
Glucose-Capillary: 125 mg/dL — ABNORMAL HIGH (ref 70–99)
Glucose-Capillary: 196 mg/dL — ABNORMAL HIGH (ref 70–99)
Glucose-Capillary: 196 mg/dL — ABNORMAL HIGH (ref 70–99)
Glucose-Capillary: 207 mg/dL — ABNORMAL HIGH (ref 70–99)

## 2022-08-04 LAB — COMPREHENSIVE METABOLIC PANEL
ALT: 111 U/L — ABNORMAL HIGH (ref 0–44)
AST: 62 U/L — ABNORMAL HIGH (ref 15–41)
Albumin: 2.9 g/dL — ABNORMAL LOW (ref 3.5–5.0)
Alkaline Phosphatase: 96 U/L (ref 38–126)
Anion gap: 7 (ref 5–15)
BUN: 19 mg/dL (ref 8–23)
CO2: 28 mmol/L (ref 22–32)
Calcium: 9 mg/dL (ref 8.9–10.3)
Chloride: 105 mmol/L (ref 98–111)
Creatinine, Ser: 0.98 mg/dL (ref 0.44–1.00)
GFR, Estimated: >60 mL/min (ref >60)
Glucose, Bld: 231 mg/dL — ABNORMAL HIGH (ref 70–99)
Potassium: 4.2 mmol/L (ref 3.5–5.1)
Sodium: 140 mmol/L (ref 135–145)
Total Bilirubin: 0.3 mg/dL (ref 0.3–1.2)
Total Protein: 6.7 g/dL (ref 6.5–8.1)

## 2022-08-04 LAB — MAGNESIUM: Magnesium: 2.2 mg/dL (ref 1.7–2.4)

## 2022-08-04 LAB — PHOSPHORUS: Phosphorus: 3.3 mg/dL (ref 2.5–4.6)

## 2022-08-04 MED ORDER — ENSURE ENLIVE PO LIQD
237.0000 mL | Freq: Two times a day (BID) | ORAL | Status: DC
  Administered 2022-08-04 – 2022-08-17 (×20): 237 mL via ORAL

## 2022-08-04 MED ORDER — JEVITY 1.5 CAL/FIBER PO LIQD: 1000.0000 mL | ORAL | Status: DC

## 2022-08-04 MED ORDER — GLUCERNA 1.5 CAL PO LIQD
1000.0000 mL | ORAL | Status: DC
  Administered 2022-08-04 – 2022-08-17 (×13): 1000 mL
  Filled 2022-08-04 (×15): qty 1000

## 2022-08-04 NOTE — TOC Progression Note (Signed)
Transition of Care [facility]) - Progression Note    Patient Details  Name: Anna Berry MRN: [id number] Date of Birth: 1961/01/02  Transition of [facility]) CM/SW Contact  Kermit Balo, RN Phone Number: 08/04/2022, 3:00 PM  Clinical Narrative:    CM met with the patient and her son. CM updated them that her information will be faxed to [facility] to start evaluating for a IR stay. They are in agreement.  Cm has faxed her information to: [phone]. Pt will need cortrak out before [facility] will accept and they will need to get insurance auth.  TOC following.   Expected Discharge Plan: IP Rehab Facility Barriers to Discharge: Continued Medical Work up  Expected Discharge Plan and Services   Discharge Planning Services: CM Consult Post Acute Care Choice: IP Rehab Living arrangements for the past 2 months: Single Family Home                                       Social Determinants of Health (SDOH) Interventions SDOH Screenings   Food Insecurity: Patient Unable To Answer (07/25/2022)  Housing: Low Risk  (07/25/2022)  Transportation Needs: Patient Unable To Answer (07/25/2022)  Utilities: Patient Unable To Answer (07/25/2022)  Depression (PHQ2-9): Low Risk  (03/27/2022)  Tobacco Use: Low Risk  (07/24/2022)    Readmission Risk Interventions     No data to display

## 2022-08-04 NOTE — Progress Notes (Addendum)
Nutrition Follow-up  DOCUMENTATION CODES:  Not applicable  INTERVENTION:  Continue PO diet per SLP 48-hour kcal count to determine if pt is meeting nutrition needs Ensure Enlive po BID, each supplement provides 350 kcal and 20 grams of protein. Adjust to nocturnal feeds of Glucerna 1.5 at 70 ml/hr x 12 hours (840 ml/day) - free water flush of q4h - Tube feeding regimen at goal rate provides 1260 kcal, 69 grams of protein, and 638 ml of H2O.(1819mL = TF+flush) Consider consult to IR for PEG placement if PO diet cannot meet needs  NUTRITION DIAGNOSIS:  Inadequate oral intake related to lethargy/confusion, inability to eat as evidenced by NPO status. - remains applicable   GOAL:  Patient will meet greater than or equal to 90% of their needs - progressing, diet advanced, TF changed to nocturnal  MONITOR:   Diet advancement, Labs, Weight trends, TF tolerance  REASON FOR ASSESSMENT:   Consult Enteral/tube feeding initiation and management  ASSESSMENT:  62 year old female who presented to the ED on 5/10 as a Code Stroke. PMH of sickle cell trait, sigmoid diverticulosis, asthma, OA. Pt admitted with L frontal ICH with L ACA distribution, concerning for hemorrhagic transformation.  5/11 - new L frontal ICH alteral to previous hematoma 5/13 - SLP recommending NPO, Cortrak placement (distal stomach) 5/14 - moved out of ICU to floor 5/15 - SLP evaluation, NPO continued. Pt did not demonstrate intentional bolus manipulation of ice chips or of puree 5/20 - MBS, DYS1, thin liquids  Pt able to have diet advanced from NPO after MBS 5/20. Pt not able to dc to outside inpatient rehab with cortrak tube in place. Will challenge pt's PO today. Discussed with RN and MD. Adjusted to PM feeds which meet ~ 70% of nutrition needs. Kcal count initiated this AM to determine the amount of PO pt takes in. Will also add nutrition supplements to augment intake  Nutritionally Relevant  Medications: Scheduled Meds:  feeding supplement (PROSource TF20)  60 mL Per Tube Daily   free water  200 mL Per Tube Q4H   insulin aspart  0-9 Units Subcutaneous Q4H   insulin aspart  2 Units Subcutaneous Q4H   pantoprazole IV  40 mg Intravenous QHS   polyethylene glycol  17 g Per Tube BID   rosuvastatin  20 mg Per Tube Daily   senna-docusate  1 tablet Per Tube BID   Continuous Infusions:  cefTRIAXone (ROCEPHIN)  IV 1 g (08/03/22 1615)   feeding supplement (JEVITY 1.5 CAL/FIBER) 1,000 mL (08/03/22 1212)   Labs Reviewed: CBG ranges from 170-205 mg/dL over the last 24 hours  NUTRITION - FOCUSED PHYSICAL EXAM: Flowsheet Row Most Recent Value  Orbital Region Moderate depletion  Upper Arm Region No depletion  Thoracic and Lumbar Region No depletion  Buccal Region Mild depletion  Temple Region No depletion  Clavicle Bone Region No depletion  Clavicle and Acromion Bone Region Mild depletion  Scapular Bone Region No depletion  Dorsal Hand No depletion  Patellar Region No depletion  Anterior Thigh Region No depletion  Posterior Calf Region No depletion  Edema (RD Assessment) Mild  [BLE]  Hair Reviewed  Eyes Reviewed  Mouth Reviewed  Skin Reviewed  Nails Reviewed   Diet Order:   Diet Order             DIET - DYS 1 Room service appropriate? No; Fluid consistency: Thin  Diet effective now  EDUCATION NEEDS:  Education needs have been addressed  Skin:  Skin Assessment: Reviewed RN Assessment  Last BM:  5/20 - x2  Height:  Ht Readings from Last 1 Encounters:  07/25/22 5\' 6"  (1.676 m)    Weight:  Wt Readings from Last 1 Encounters:  08/04/22 89.4 kg    Ideal Body Weight:  59.1 kg  BMI:  Body mass index is 31.81 kg/m.  Estimated Nutritional Needs:  Kcal:  1750-1950 Protein:  90-110 grams Fluid:  1.7-1.9 L    Greig Castilla, RD, LDN Clinical Dietitian RD pager # available in AMION  After hours/weekend pager # available in [facility]

## 2022-08-04 NOTE — Progress Notes (Signed)
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 Per report she drove from at 5 PM walked into her house and had multiple concerns. Family called EMS and she was brought in as a code stroke. She is able to tell admitting physician her name and age and current month her voice is very hypophonic. 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 She was transferred out of the intensive care unit to the tried hospitalist service on 07/29/2022.    The day that she was transferred to the [facility] service she spiked a temperature so further workup is being done.  Nursing states that she is starting have some diarrhea so we will check her for an infectious etiology.  Currently unclear etiology of her fevers.  Has a Cortrak in place and will continue for now and continue SLP doing well from that standpoint now and was placed on a diet.  Calorie count has been initiated and tube feedings have been changed to nocturnal feeds and tube feeds have been changed to Glucerna at 70 mL/hr.  The diabetes education coordinator recommends continue to watch the sugar trends on the Novolog Correction every 4 hours.   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 distribution with concern for hemorrhagic transformation and then had a new left frontal ICH lateral to the previous hematoma with unclear etiology -MRI and repeat CT done -CTV showed no venous  thrombosis -Echocardiogram done and showed a normal EF of 50 to 55% with no regional wall motion abnormalities and normal left atrial size -EEG done and showed no seizure -Hemoglobin A1c was 6.0 and LDL was 105 -Not on any antithrombotics due to ICH -He is on a statin now -PT OT recommending Inpatient Rehab but does not have insurance coverage for inpatient rehab here and cannot go to rehab with Cortrak so will need to either be able to tolerate a diet or will require a PEG; Will follow on SLP re-evalaution and will need PEG likely  -Neurology following recommending allowing for the blood to be better absorbed and planning a repeat head CT in about 4 weeks and if this is stable and improved consider antiplatelet therapy at that time -Following some commands on the Left and son says she chuckled yesterday -C/w Therapy.    Elevated blood pressure -Weaned off of Cleviprex -Given that her repeat CT scan on 530 was stable the neurologist physical extra blood pressure goal to 130s to 150s systolic and continues recommend long-term blood pressure goal being normotensive -Currently has labetalol 10 mg IV every 2 as needed as needed for SBP above 150 -Last BP was 142/87   Hyperlipidemia -Patient had a total cholesterol/HDL ratio of 3.3, cholesterol level 164, HDL of 49, LDL 105, triglycerides of 52, VLDL of 10 -Neurology is initiated the patient on a statin rosuvastatin 20 mg per tube daily   Dysphagia -Has a small bore cortrak TF currently -MBS done yesterday at 1330 and showed she is found to  have no instances of penetration or aspiration noted and prior to clearance was within normal limits so dysphagia 1 diet with thin liquids recommended this time -Have consulted dietitian for calorie count so we can try and remove the Cortrak -Calorie count has been initiated and currently the nutritionist has changed the patient's tube feedings to nocturnal feeding from 8 PM to 8 AM with Glucerna 70 MLS per  hour and allowing for the patient to eat during the day.  Patient is to continue get free water flushes of 200 mL every 4   Hypernatremia -Na+ Trend: Recent Labs  Lab 07/29/22 0740 07/30/22 0429 07/31/22 1411 08/01/22 0408 08/02/22 0440 08/03/22 0530 08/04/22 0505  NA 151* 146* 148* 150* 145 140 140  -IVF has now stopped -Continue Free Water Flushes at 200 mL q4h -Repeat CMP in the AM    Fever in the setting of UTI, improved  -Unclear Etiology -Spiked a Temperature of 100.4 the day she was transferred to [facility] and went up to 101.2; No further fevers now but WBC slowly trending up -Pan-cultured when she spiked a Temp -WBC and Lactic Acid Trend: Recent Labs  Lab 07/29/22 0740 07/29/22 1204 07/30/22 0429 07/30/22 0849 07/30/22 1149 07/31/22 1411 08/01/22 0408 08/02/22 0440 08/03/22 0530 08/04/22 0505  WBC 11.7*  --  13.0*  --   --  10.9* 10.3 9.5 10.1 11.3*  LATICACIDVEN  --    < >  --    < > 1.4  --   --   --   --   --    < > = values in this interval not displayed.  -PCT Negative at <0.10 -Checking blood cultures x 2 and showing NGTD < 24 hours -Urinalysis done and showed a hazy appearance with moderate hemoglobin, moderate leukocytes, negative nitrites, many bacteria, 11-20 RBCs per high-power field, limited 20 WBCs and will send for urine culture before initiating antibiotics; Urine Cx showing >100,000 of E Coli and 20,000 CFU of Klebsiella Pneumoniae; Sensitivities for E Coli and Klebsiella Sensitive to Ceftriaxone (Will treat for 7 days total)  -Chest x-ray done and showed "There is a left arm PICC line with tip at the superior cavoatrial junction. Feeding tube courses below the level of the GE junction. Heart size appears normal. No pleural fluid or airspace disease. The visualized osseous structures are unremarkable." -IVF had stopped but will resume again -GI Pathogen panel negative    Abnormal LFTS -In the setting of TF? And Statin Use -Mild and LFT Trend: Recent  Labs  Lab 07/29/22 0740 07/30/22 0429 07/31/22 1411 08/01/22 0408 08/02/22 0440 08/03/22 0530 08/04/22 0505  AST 35 26 45* 34 47* 55* 62*  ALT 24 31 63* 60* 74* 96* 111*  -Check RUQ U/S and Acute Hepatitis Panel -If continues to worsen or not improve will consider holding Statin  -Continue to Monitor and Trend and repeat CMP in the AM    Lactic Acidosis -Lactic acid level trend: Recent Labs  Lab 07/29/22 1204 07/29/22 1506 07/30/22 0849 07/30/22 1149  LATICACIDVEN 2.3* 3.2* 1.5 1.4  -IVF with Free Water Flushes now stopped. Continue on Free Water Flushes -Will not repeat now improved   GERD/GI prophylaxis -Continue with Pantoprazole 40 mg IV nightly   Hypoalbuminemia -Patient's Albumin Trend Recent Labs  Lab 07/29/22 0740 07/30/22 0429 07/31/22 1411 08/01/22 0408 08/02/22 0440 08/03/22 0530 08/04/22 0505  ALBUMIN 4.1 3.8 3.6 3.3* 3.0* 3.0* 2.9*  -Continue to Monitor and Trend and repeat CMP in the AM  Obesity -Complicates overall prognosis and care -Estimated body mass index is 31.81 kg/m as calculated from the following:   Height as of this encounter: 5\' 6"  (1.676 m).   Weight as of this encounter: 89.4 kg.  -Weight Loss and Dietary Counseling will be given once more awake  DVT prophylaxis: heparin injection 5,000 Units Start: 07/27/22 1300 SCD's Start: 07/25/22 0359    Code Status: Full Code Family Communication: Discussed with the patient's son at bedside  Disposition Plan:  Level of care: Telemetry Medical Status is: Inpatient Remains inpatient appropriate because:  Needs further clinical Improvement and will need to go to Inpatient Rehab (outside facility) but will need to see about Nutrition and remove Cortrak if able to tolerate diet or place PEG if unable to meet Caloric needs  Consultants:  Neurology  Procedures:  As delineated as above   Antimicrobials:  Anti-infectives (From admission, onward)    Start     Dose/Rate Route Frequency  Ordered Stop   07/31/22 1600  cefTRIAXone (ROCEPHIN) 1 g in sodium chloride 0.9 % 100 mL IVPB        1 g 200 mL/hr over 30 Minutes Intravenous Every 24 hours 07/31/22 1503 08/06/22 2359       Subjective: And examined at bedside and more awake today and follows commands on the left.  Son states that she has been trying to verbalize a little bit.  Continues to have global aphasia and son states that she tolerated her diet quite well this morning.  No other concerns or complaints at this time.  Objective: Vitals:   08/03/22 2311 08/04/22 0453 08/04/22 0600 08/04/22 0757  BP: 103/62 107/65  119/74  Pulse: 91 87  88  Resp: 18 16  18   Temp: 98.4 F (36.9 C) 98.8 F (37.1 C)  98.1 F (36.7 C)  TempSrc: Oral Oral  Oral  SpO2: 97% 95%  96%  Weight:   89.4 kg   Height:        Intake/Output Summary (Last 24 hours) at 08/04/2022 0840 Last data filed at 08/03/2022 2208 Gross per 24 hour  Intake 130 ml  Output 1100 ml  Net -970 ml   Filed Weights   08/02/22 0500 08/03/22 0500 08/04/22 0600  Weight: 90.7 kg 90 kg 89.4 kg   Examination: Physical Exam:  Constitutional: WN/WD obese African-American female who is awake and more alert and cannot really speak given her global aphasia and has dense right hemiplegia Respiratory: Diminished to auscultation bilaterally with some coarse breath sounds, no wheezing, rales, rhonchi or crackles. Normal respiratory effort and patient is not tachypenic. No accessory muscle use.  Unlabored breathing Cardiovascular: RRR, no murmurs / rubs / gallops. S1 and S2 auscultated. No extremity edema.  Abdomen: Soft, non-tender, distended secondary to body habitus. Bowel sounds positive.  GU: Deferred. Musculoskeletal: No clubbing / cyanosis of digits/nails. No joint deformity upper and lower extremities.  Skin: No rashes, lesions, ulcers on a limited skin evaluation. No induration; Warm and dry.  Neurologic: Has dense hemiplegia on the right and continues to be  aphasic but does follow commands in the left and able to squeeze fingers Psychiatric: Appears calm  Data Reviewed: I have personally reviewed following labs and imaging studies  CBC: Recent Labs  Lab 07/31/22 1411 08/01/22 0408 08/02/22 0440 08/03/22 0530 08/04/22 0505  WBC 10.9* 10.3 9.5 10.1 11.3*  NEUTROABS 7.6 6.7 5.9 6.2 7.1  HGB 14.0 14.0 12.8 12.6 12.5  HCT 44.0 43.8 40.6 39.4 38.3  MCV  88.2 88.0 89.2 87.8 85.1  PLT 248 254 245 242 231   Basic Metabolic Panel: Recent Labs  Lab 07/31/22 1411 08/01/22 0408 08/02/22 0440 08/03/22 0530 08/04/22 0505  NA 148* 150* 145 140 140  K 4.3 4.2 3.9 4.1 4.2  CL 111 109 107 103 105  CO2 26 30 30 29 28   GLUCOSE 178* 168* 214* 248* 231*  BUN 30* 28* 23 18 19   CREATININE 1.18* 1.18* 1.12* 0.98 0.98  CALCIUM 9.2 9.3 9.0 8.9 9.0  MG 2.8* 3.0* 2.7* 2.2 2.2  PHOS 4.3 4.5 4.0 3.6 3.3   GFR: Estimated Creatinine Clearance: 66.1 mL/min (by C-G formula based on SCr of 0.98 mg/dL). Liver Function Tests: Recent Labs  Lab 07/31/22 1411 08/01/22 0408 08/02/22 0440 08/03/22 0530 08/04/22 0505  AST 45* 34 47* 55* 62*  ALT 63* 60* 74* 96* 111*  ALKPHOS 76 82 83 82 96  BILITOT 0.4 0.5 0.7 0.4 0.3  PROT 7.3 7.0 6.5 6.6 6.7  ALBUMIN 3.6 3.3* 3.0* 3.0* 2.9*   No results for input(s): "LIPASE", "AMYLASE" in the last 168 hours. No results for input(s): "AMMONIA" in the last 168 hours. Coagulation Profile: No results for input(s): "INR", "PROTIME" in the last 168 hours. Cardiac Enzymes: No results for input(s): "CKTOTAL", "CKMB", "CKMBINDEX", "TROPONINI" in the last 168 hours. BNP (last 3 results) No results for input(s): "PROBNP" in the last 8760 hours. HbA1C: No results for input(s): "HGBA1C" in the last 72 hours. CBG: Recent Labs  Lab 08/03/22 1629 08/03/22 2013 08/03/22 2311 08/04/22 0454 08/04/22 0757  GLUCAP 205* 170* 189* 207* 196*   Lipid Profile: No results for input(s): "CHOL", "HDL", "LDLCALC", "TRIG",  "CHOLHDL", "LDLDIRECT" in the last 72 hours. Thyroid Function Tests: No results for input(s): "TSH", "T4TOTAL", "FREET4", "T3FREE", "THYROIDAB" in the last 72 hours. Anemia Panel: No results for input(s): "VITAMINB12", "FOLATE", "FERRITIN", "TIBC", "IRON", "RETICCTPCT" in the last 72 hours. Sepsis Labs: Recent Labs  Lab 07/29/22 0740 07/29/22 1204 07/29/22 1506 07/30/22 0849 07/30/22 1149  PROCALCITON <0.10  --   --   --   --   LATICACIDVEN  --  2.3* 3.2* 1.5 1.4    Recent Results (from the past 240 hour(s))  Culture, blood (Routine X 2) w Reflex to ID Panel     Status: None   Collection Time: 07/29/22 12:04 PM   Specimen: BLOOD RIGHT HAND  Result Value Ref Range Status   Specimen Description BLOOD RIGHT HAND  Final   Special Requests   Final    BOTTLES DRAWN AEROBIC AND ANAEROBIC Blood Culture adequate volume   Culture   Final    NO GROWTH 5 DAYS Performed at [facility], [address], [place], [place] [address]    Report Status 08/03/2022 FINAL  Final  Culture, blood (Routine X 2) w Reflex to ID Panel     Status: None   Collection Time: 07/29/22 12:04 PM   Specimen: BLOOD RIGHT ARM  Result Value Ref Range Status   Specimen Description BLOOD RIGHT ARM  Final   Special Requests   Final    BOTTLES DRAWN AEROBIC AND ANAEROBIC Blood Culture results may not be optimal due to an inadequate volume of blood received in culture bottles   Culture   Final    NO GROWTH 5 DAYS Performed at [facility], [address], [place], [place] [address]    Report Status 08/03/2022 FINAL  Final  Urine Culture (for pregnant, neutropenic or urologic patients  or patients with an indwelling urinary catheter)     Status: Abnormal   Collection Time: 07/29/22  6:08 PM   Specimen: Urine, Clean Catch  Result Value Ref Range Status   Specimen Description URINE, CLEAN CATCH  Final   Special Requests   Final    NONE Performed at [facility], [address] [place], [place]  [address]    Culture (A)  Final    >=100,000 COLONIES/mL ESCHERICHIA COLI 20,000 COLONIES/mL KLEBSIELLA PNEUMONIAE    Report Status 08/02/2022 FINAL  Final   Organism ID, Bacteria ESCHERICHIA COLI (A)  Final   Organism ID, Bacteria KLEBSIELLA PNEUMONIAE (A)  Final      Susceptibility   Escherichia coli - MIC*    AMPICILLIN >=32 RESISTANT Resistant     CEFAZOLIN <=4 SENSITIVE Sensitive     CEFEPIME <=0.12 SENSITIVE Sensitive     CEFTRIAXONE <=0.25 SENSITIVE Sensitive     CIPROFLOXACIN >=4 RESISTANT Resistant     GENTAMICIN 4 SENSITIVE Sensitive     IMIPENEM <=0.25 SENSITIVE Sensitive     NITROFURANTOIN <=16 SENSITIVE Sensitive     TRIMETH/SULFA <=20 SENSITIVE Sensitive     AMPICILLIN/SULBACTAM >=32 RESISTANT Resistant     PIP/TAZO <=4 SENSITIVE Sensitive     * >=100,000 COLONIES/mL ESCHERICHIA COLI   Klebsiella pneumoniae - MIC*    AMPICILLIN >=32 RESISTANT Resistant     CEFAZOLIN <=4 SENSITIVE Sensitive     CEFEPIME <=0.12 SENSITIVE Sensitive     CEFTRIAXONE <=0.25 SENSITIVE Sensitive     CIPROFLOXACIN <=0.25 SENSITIVE Sensitive     GENTAMICIN <=1 SENSITIVE Sensitive     IMIPENEM <=0.25 SENSITIVE Sensitive     NITROFURANTOIN 64 INTERMEDIATE Intermediate     TRIMETH/SULFA <=20 SENSITIVE Sensitive     AMPICILLIN/SULBACTAM 4 SENSITIVE Sensitive     PIP/TAZO <=4 SENSITIVE Sensitive     * 20,000 COLONIES/mL KLEBSIELLA PNEUMONIAE  Gastrointestinal Panel by PCR , Stool     Status: None   Collection Time: 07/30/22  8:34 AM   Specimen: Stool  Result Value Ref Range Status   Campylobacter species NOT DETECTED NOT DETECTED Final   Plesimonas shigelloides NOT DETECTED NOT DETECTED Final   Salmonella species NOT DETECTED NOT DETECTED Final   Yersinia enterocolitica NOT DETECTED NOT DETECTED Final   Vibrio species NOT DETECTED NOT DETECTED Final   Vibrio cholerae NOT DETECTED NOT DETECTED Final   Enteroaggregative E coli (EAEC) NOT DETECTED NOT DETECTED Final   Enteropathogenic E coli  (EPEC) NOT DETECTED NOT DETECTED Final   Enterotoxigenic E coli (ETEC) NOT DETECTED NOT DETECTED Final   Shiga like toxin producing E coli (STEC) NOT DETECTED NOT DETECTED Final   Shigella/Enteroinvasive E coli (EIEC) NOT DETECTED NOT DETECTED Final   Cryptosporidium NOT DETECTED NOT DETECTED Final   Cyclospora cayetanensis NOT DETECTED NOT DETECTED Final   Entamoeba histolytica NOT DETECTED NOT DETECTED Final   Giardia lamblia NOT DETECTED NOT DETECTED Final   Adenovirus F40/41 NOT DETECTED NOT DETECTED Final   Astrovirus NOT DETECTED NOT DETECTED Final   Norovirus GI/GII NOT DETECTED NOT DETECTED Final   Rotavirus A NOT DETECTED NOT DETECTED Final   Sapovirus (I, II, IV, and V) NOT DETECTED NOT DETECTED Final    Comment: Performed at [facility], [address], [place], [place] [address]    Radiology Studies: DG Swallowing Func-Speech Pathology  Result Date: 08/03/2022 Table formatting from the original result was not included. Modified Barium Swallow Study Patient Details Name: Adalynn Kelm Lovelady MRN:  [id number] Date of Birth: [birth year] Today's Date: 08/03/2022 HPI/PMH: HPI: Pt is a 62 y.o. female who presented with difficulty speaking. MRI brain 5/11: Large area of hemorrhage (approximately 18 mL) in the left frontal lobe, within the anterior cerebral artery territory. Pt passed [facility] 5/11, but then had neuro changes and could not complete a repeat [facility] due to lethargy. PMH: sickle cell trait, sigmoid diverticulosis, asthma, OA; BSE completed on 07/26/22 with NPO recommendation. ST f/u for SLE and po readiness. Clinical Impression: Clinical Impression: Pt presents with oral phase dysphagia characterized by reduced labial seal, oral holding, impaired mastication, and reduced bolus cohesion. She demonstrated anterior spillage, prolonged and disorganized mastication, oral residue, and premature spillage to the valleculae. No instances of penetration/aspiration were noted and pharyngeal  clearance was WNL. A dysphagia 1 diet with thin liquids is recommended at this time. SLP will continue to follow for dysphagia treatment and prognosis for further diet advancement is judged to be good. Factors that may increase risk of adverse event in presence of aspiration Rubye Oaks & Clearance Coots 2021): Factors that may increase risk of adverse event in presence of aspiration Rubye Oaks & Clearance Coots 2021): Presence of tubes (ETT, trach, NG, etc.); Dependence for feeding and/or oral hygiene Recommendations/Plan: Swallowing Evaluation Recommendations Swallowing Evaluation Recommendations Recommendations: PO diet PO Diet Recommendation: Dysphagia 1 (Pureed); Thin liquids (Level 0) Liquid Administration via: Cup; Straw Medication Administration: Crushed with puree (or via Cortrak) Supervision: Full supervision/cueing for swallowing strategies Swallowing strategies  : Follow solids with liquids; Small bites/sips Postural changes: Position pt fully upright for meals Oral care recommendations: Oral care BID (2x/day) Treatment Plan Treatment Plan Treatment recommendations: Therapy as outlined in treatment plan below Follow-up recommendations: Acute inpatient rehab (3 hours/day) Functional status assessment: Patient has had a recent decline in their functional status and demonstrates the ability to make significant improvements in function in a reasonable and predictable amount of time. Treatment frequency: Min 2x/week Treatment duration: 2 weeks Interventions: Trials of upgraded texture/liquids; Diet toleration management by SLP Recommendations Recommendations for follow up therapy are one component of a multi-disciplinary discharge planning process, led by the attending physician.  Recommendations may be updated based on patient status, additional functional criteria and insurance authorization. Assessment: Orofacial Exam: Orofacial Exam Oral Cavity: Oral Hygiene: WFL Oral Cavity - Dentition: Adequate natural dentition Anatomy:  Anatomy: WFL Boluses Administered: Boluses Administered Boluses Administered: Thin liquids (Level 0); Mildly thick liquids (Level 2, nectar thick); Moderately thick liquids (Level 3, honey thick); Puree; Solid  Oral Impairment Domain: Oral Impairment Domain Lip Closure: Escape beyond mid-chin Tongue control during bolus hold: Escape to lateral buccal cavity/floor of mouth; Posterior escape of less than half of bolus Bolus preparation/mastication: Disorganized chewing/mashing with solid pieces of bolus unchewed  Pharyngeal Impairment Domain: Pharyngeal Impairment Domain Soft palate elevation: No bolus between soft palate (SP)/pharyngeal wall (PW) Laryngeal elevation: Complete superior movement of thyroid cartilage with complete approximation of arytenoids to epiglottic petiole Anterior hyoid excursion: Complete anterior movement Epiglottic movement: Complete inversion Laryngeal vestibule closure: Complete, no air/contrast in laryngeal vestibule Pharyngeal stripping wave : Present - complete Pharyngeal contraction (A/P view only): N/A Pharyngoesophageal segment opening: Complete distension and complete duration, no obstruction of flow Tongue base retraction: Trace column of contrast or air between tongue base and PPW Pharyngeal residue: Trace residue within or on pharyngeal structures Location of pharyngeal residue: Valleculae; Pyriform sinuses  Esophageal Impairment Domain: Esophageal Impairment Domain Esophageal clearance upright position: Complete clearance, esophageal coating Pill: Esophageal Impairment Domain Esophageal clearance upright position: Complete clearance,  esophageal coating Penetration/Aspiration Scale Score: Penetration/Aspiration Scale Score 1.  Material does not enter airway: Thin liquids (Level 0); Mildly thick liquids (Level 2, nectar thick); Moderately thick liquids (Level 3, honey thick); Puree; Solid; Pill Compensatory Strategies: Compensatory Strategies Compensatory strategies: No   General  Information: Caregiver present: No  Diet Prior to this Study: NPO; Cortrak/Small bore NG tube   Temperature : Normal   Respiratory Status: WFL   Supplemental O2: None (Room air)   History of Recent Intubation: No  Behavior/Cognition: Alert; Cooperative; Doesn't follow directions; Requires cueing Self-Feeding Abilities: Able to self-feed Baseline vocal quality/speech: Not observed Volitional Cough: Unable to elicit Volitional Swallow: Able to elicit Exam Limitations: -- (Difficulty following commands) Goal Planning: Prognosis for improved oropharyngeal function: Good Barriers to Reach Goals: Language deficits No data recorded Patient/Family Stated Goal: none stated No data recorded Pain: Pain Assessment Pain Assessment: No/denies pain Faces Pain Scale: 0 Breathing: 0 Negative Vocalization: 0 Facial Expression: 0 Body Language: 0 Consolability: 0 PAINAD Score: 0 Facial Expression: 0 Body Movements: 0 Muscle Tension: 0 Compliance with ventilator (intubated pts.): N/A Vocalization (extubated pts.): N/A CPOT Total: 0 Pain Location: grimacing with noxious stimuli/movement of cortrak, redness at L eye Pain Descriptors / Indicators: Discomfort; Grimacing; Crying Pain Intervention(s): Monitored during session End of Session: Start Time:SLP Start Time (ACUTE ONLY): 1340 Stop Time: SLP Stop Time (ACUTE ONLY): 1358 Time Calculation:SLP Time Calculation (min) (ACUTE ONLY): 18 min Charges: SLP Evaluations $ SLP Speech Visit: 1 Visit SLP Evaluations $BSS Swallow: 1 Procedure $MBS Swallow: 1 Procedure $ SLP EVAL LANGUAGE/SOUND PRODUCTION: 1 Procedure $Swallowing Treatment: 1 Procedure SLP visit diagnosis: SLP Visit Diagnosis: Dysphagia, oral phase (R13.11) Past Medical History: Past Medical History: Diagnosis Date  Allergy   Anemia   Arthritis   Asthma   allergies induced  Bilateral lower extremity edema   Depression   Fibroids 08/2002  Gross hematuria 05/2010  History of appendicitis   History of colon polyps 2017  Multiple renal  cysts 05/24/2008  Two left renal parapelvic cysts.  OA (osteoarthritis) of knee   Right  Obesity   Plantar fasciitis   Postmenopausal   Pre-diabetes   Renal papillary necrosis (HCC)   Seasonal allergies   Sickle cell anemia (HCC)   sickle cell trait  Sickle cell trait (HCC)   Sigmoid diverticulosis 09/26/2015  Noted on colonoscopy Past Surgical History: Past Surgical History: Procedure Laterality Date  ABDOMINAL HYSTERECTOMY  2004  APPENDECTOMY  2006  CESAREAN SECTION  1981, 1987  x2  COLONOSCOPY    COLONOSCOPY W/ POLYPECTOMY  09/26/2015  POLYPECTOMY    TOTAL KNEE ARTHROPLASTY Right 07/29/2018  Procedure: RIGHT TOTAL KNEE ARTHROPLASTY;  Surgeon: Tarry Kos, MD;  Location: [facility];  Service: Orthopedics;  Laterality: Right; Shanika I. Vear Clock, MS, CCC-SLP Neuro Diagnostic Specialist Acute Rehabilitation Services Office number: [phone] Scheryl Marten 08/03/2022, 3:16 PM   Scheduled Meds:  Chlorhexidine Gluconate Cloth  6 each Topical Q0600   feeding supplement (GLUCERNA 1.5 CAL)  1,000 mL Per Tube Q24H   free water  200 mL Per Tube Q4H   heparin injection (subcutaneous)  5,000 Units Subcutaneous Q8H   insulin aspart  0-9 Units Subcutaneous Q4H   insulin aspart  2 Units Subcutaneous Q4H   mouth rinse  15 mL Mouth Rinse 4 times per day   pantoprazole (PROTONIX) IV  40 mg Intravenous QHS   polyethylene glycol  17 g Per Tube BID   rosuvastatin  20 mg Per Tube Daily   senna-docusate  1 tablet Per Tube BID   sodium chloride flush  10-40 mL Intracatheter Q12H   Continuous Infusions:  cefTRIAXone (ROCEPHIN)  IV 1 g (08/03/22 1615)    LOS: 10 days   Marguerita Merles, DO [facility] Available via Epic secure chat 7am-7pm After these hours, please refer to coverage provider listed on [email or web address] 08/04/2022, 8:40 AM

## 2022-08-04 NOTE — Inpatient Diabetes Management (Signed)
Inpatient Diabetes Program Recommendations  AACE/ADA: New Consensus Statement on Inpatient Glycemic Control (2015)  Target Ranges:  Prepandial:   less than 140 mg/dL      Peak postprandial:   less than 180 mg/dL (1-2 hours)      Critically ill patients:  140 - 180 mg/dL   Lab Results  Component Value Date   GLUCAP 196 (H) 08/04/2022   HGBA1C 6.0 (H) 07/25/2022    Review of Glycemic Control  Latest Reference Range & Units 08/03/22 09:03 08/03/22 13:09 08/03/22 16:29 08/03/22 20:13 08/03/22 23:11 08/04/22 04:54 08/04/22 07:57  Glucose-Capillary 70 - 99 mg/dL 161 (H) 096 (H) 045 (H) 170 (H) 189 (H) 207 (H) 196 (H)   Diabetes history: None Outpatient Diabetes medications: none Current orders for Inpatient glycemic control:  Novolog 0-9 units Q4 hours Novolog 2 units Q4 hours Tube Feed Coverage  A1c 6% on 5/11 Glucerna 1.5 70 ml/hour  Note: adjustments made by RD to Tube Feeds for nocturnal feeds 8p-8a and diet ordered. Tube Feeds also switched to Glucerna @ 70 ml/hour  Inpatient Diabetes Program Recommendations:    - watch trends on Novolog Q4 Correction and TF coverage for now.  Thanks,  Christena Deem RN, MSN, BC-ADM Inpatient Diabetes Coordinator Team Pager [phone] (8a-5p)

## 2022-08-04 NOTE — Progress Notes (Signed)
Physical Therapy Treatment Patient Details Name: Anna Berry MRN: [id number] DOB: 1960-05-17 Today's Date: 08/04/2022   History of Present Illness Pt is a 62 y.o. female who presented 07/25/22 with speech difficulties. MRI showed a L frontal ICH. Worsening neuro decline noted 5/11 after admitting pt. Repeat imaging showed new area of ICH lateral to current ICH with MLS 5 mm. PMH: Sickle cell trait, sigmoid diverticulosis, asthma, OA, obesity, pre-diabetes    PT Comments    Pt more alert today than yesterday, family present. Pt did not attempt to verbalize but she did laugh and make appropriate facial gestures. Follows 50% of 1 step, simple commands, sometimes needs tactile stimulation. Mod A needed to come to sitting EOB. In sitting and standing pt with strong R lean today and pushes with LUE when allowed to extend it. Worked on achieving midline in sitting and standing within stedy. Max A +2 needed to stand to stedy. Pt positioned in recliner with lift pad under for return to bed later. Patient will benefit from intensive inpatient follow up therapy, >3 hours/day. PT will continue to follow.    Recommendations for follow up therapy are one component of a multi-disciplinary discharge planning process, led by the attending physician.  Recommendations may be updated based on patient status, additional functional criteria and insurance authorization.  Follow Up Recommendations       Assistance Recommended at Discharge Frequent or constant Supervision/Assistance  Patient can return home with the following Two people to help with walking and/or transfers;Two people to help with bathing/dressing/bathroom;Assistance with feeding;Assistance with cooking/housework;Direct supervision/assist for medications management;Direct supervision/assist for financial management;Assist for transportation;Help with stairs or ramp for entrance   Equipment Recommendations  Hospital bed;Other (comment) (hoyer lift,  tilt in space wheelchair)    Recommendations for Other Services Rehab consult     Precautions / Restrictions Precautions Precautions: Fall Precaution Comments: L wrist restraint, cortrak, SBP <169 Restrictions Weight Bearing Restrictions: No     Mobility  Bed Mobility Overal bed mobility: Needs Assistance Bed Mobility: Supine to Sit     Supine to sit: HOB elevated, Mod assist     General bed mobility comments: pt moving LLE when cued and needed assist for RLE. Elevated trunk from elevated HOB without assist and needed mod A to scoot hips to EOB    Transfers Overall transfer level: Needs assistance Equipment used: Ambulation equipment used Transfers: Sit to/from Stand, Bed to chair/wheelchair/BSC Sit to Stand: +2 physical assistance, Max assist Stand pivot transfers: +2 physical assistance, Total assist         General transfer comment: pt pushes with L hand when allowed to extend that arm and shows gravitational insecurity when being brought to midline with in stedy. Worked on achieving midline in perched sitting Transfer via Lift Equipment: Stedy  Ambulation/Gait               General Gait Details: unable   Social research officer, government Rankin (Stroke Patients Only) Modified Rankin (Stroke Patients Only) Pre-Morbid Rankin Score: No symptoms Modified Rankin: Severe disability     Balance Overall balance assessment: Needs assistance Sitting-balance support: Feet supported, No upper extremity supported Sitting balance-Leahy Scale: Poor Sitting balance - Comments: R lean, does better when not allowed to extend LUE, worked on reaching activities with LUE in sitting to achieve midline before standing Postural control: Right lateral lean Standing balance support: During functional activity, Bilateral upper extremity  supported Standing balance-Leahy Scale: Zero Standing balance comment: max A +2 to maintain standing                             Cognition Arousal/Alertness: Awake/alert Behavior During Therapy: Flat affect Overall Cognitive Status: Difficult to assess Area of Impairment: Following commands                   Current Attention Level: Focused   Following Commands: Follows one step commands inconsistently   Awareness: Intellectual   General Comments: Pt follows one step, simple commands 50% of time, sometimes needs tactile stimulation with vc's.        Exercises Other Exercises Other Exercises: stretching to RUE and RLE, increased tone noted    General Comments General comments (skin integrity, edema, etc.): family present and instructed to continue to encourage her to attempt to verbalize. NG coming out soon which will make pt happy, when LUE not restrained she quickly gets to the NG tube with that hand      Pertinent Vitals/Pain Pain Assessment Pain Assessment: Faces Faces Pain Scale: No hurt    Home Living                          Prior Function            PT Goals (current goals can now be found in the care plan section) Acute Rehab PT Goals Patient Stated Goal: did not state; family hopes for pt to improve PT Goal Formulation: With family Time For Goal Achievement: 08/09/22 Potential to Achieve Goals: Good Progress towards PT goals: Progressing toward goals    Frequency    Min 4X/week      PT Plan Current plan remains appropriate    Co-evaluation              AM-PAC PT "6 Clicks" Mobility   Outcome Measure  Help needed turning from your back to your side while in a flat bed without using bedrails?: Total Help needed moving from lying on your back to sitting on the side of a flat bed without using bedrails?: A Lot Help needed moving to and from a bed to a chair (including a wheelchair)?: Total Help needed standing up from a chair using your arms (e.g., wheelchair or bedside chair)?: Total Help needed to walk in hospital room?:  Total Help needed climbing 3-5 steps with a railing? : Total 6 Click Score: 7    End of Session Equipment Utilized During Treatment: Gait belt Activity Tolerance: Patient tolerated treatment well Patient left: with call bell/phone within reach;with restraints reapplied;in chair;with chair alarm set;with family/visitor present (lift pad under pt) Nurse Communication: Mobility status;Need for lift equipment PT Visit Diagnosis: Unsteadiness on feet (R26.81);Muscle weakness (generalized) (M62.81);Difficulty in walking, not elsewhere classified (R26.2);Other symptoms and signs involving the nervous system (R29.898);Hemiplegia and hemiparesis Hemiplegia - Right/Left: Right Hemiplegia - dominant/non-dominant: Dominant Hemiplegia - caused by: Other Nontraumatic intracranial hemorrhage     Time: 1238-1310 PT Time Calculation (min) (ACUTE ONLY): 32 min  Charges:  $Therapeutic Activity: 8-22 mins $Neuromuscular Re-education: 8-22 mins                     Lyanne Co, PT  Acute Rehab Services Secure chat preferred Office [phone]    Lawana Chambers Alanta Scobey 08/04/2022, 1:44 PM

## 2022-08-05 DIAGNOSIS — I611 Nontraumatic intracerebral hemorrhage in hemisphere, cortical: Secondary | ICD-10-CM | POA: Diagnosis not present

## 2022-08-05 DIAGNOSIS — B9689 Other specified bacterial agents as the cause of diseases classified elsewhere: Secondary | ICD-10-CM

## 2022-08-05 DIAGNOSIS — B9620 Unspecified Escherichia coli [E. coli] as the cause of diseases classified elsewhere: Secondary | ICD-10-CM | POA: Diagnosis not present

## 2022-08-05 DIAGNOSIS — R1310 Dysphagia, unspecified: Secondary | ICD-10-CM

## 2022-08-05 DIAGNOSIS — N390 Urinary tract infection, site not specified: Secondary | ICD-10-CM | POA: Diagnosis not present

## 2022-08-05 LAB — COMPREHENSIVE METABOLIC PANEL
ALT: 156 U/L — ABNORMAL HIGH (ref 0–44)
AST: 91 U/L — ABNORMAL HIGH (ref 15–41)
Albumin: 3.1 g/dL — ABNORMAL LOW (ref 3.5–5.0)
Alkaline Phosphatase: 103 U/L (ref 38–126)
Anion gap: 9 (ref 5–15)
BUN: 20 mg/dL (ref 8–23)
CO2: 27 mmol/L (ref 22–32)
Calcium: 9 mg/dL (ref 8.9–10.3)
Chloride: 102 mmol/L (ref 98–111)
Creatinine, Ser: 1.06 mg/dL — ABNORMAL HIGH (ref 0.44–1.00)
GFR, Estimated: 59 mL/min — ABNORMAL LOW (ref >60)
Glucose, Bld: 184 mg/dL — ABNORMAL HIGH (ref 70–99)
Potassium: 4 mmol/L (ref 3.5–5.1)
Sodium: 138 mmol/L (ref 135–145)
Total Bilirubin: 0.3 mg/dL (ref 0.3–1.2)
Total Protein: 6.5 g/dL (ref 6.5–8.1)

## 2022-08-05 LAB — GLUCOSE, CAPILLARY
Glucose-Capillary: 102 mg/dL — ABNORMAL HIGH (ref 70–99)
Glucose-Capillary: 119 mg/dL — ABNORMAL HIGH (ref 70–99)
Glucose-Capillary: 135 mg/dL — ABNORMAL HIGH (ref 70–99)
Glucose-Capillary: 146 mg/dL — ABNORMAL HIGH (ref 70–99)
Glucose-Capillary: 207 mg/dL — ABNORMAL HIGH (ref 70–99)
Glucose-Capillary: 211 mg/dL — ABNORMAL HIGH (ref 70–99)

## 2022-08-05 LAB — CBC WITH DIFFERENTIAL/PLATELET
Abs Immature Granulocytes: 0.08 10*3/uL — ABNORMAL HIGH (ref 0.00–0.07)
Basophils Absolute: 0.1 10*3/uL (ref 0.0–0.1)
Basophils Relative: 1 %
Eosinophils Absolute: 0.4 10*3/uL (ref 0.0–0.5)
Eosinophils Relative: 3 %
HCT: 38.5 % (ref 36.0–46.0)
Hemoglobin: 12.6 g/dL (ref 12.0–15.0)
Immature Granulocytes: 1 %
Lymphocytes Relative: 24 %
Lymphs Abs: 2.9 10*3/uL (ref 0.7–4.0)
MCH: 28.3 pg (ref 26.0–34.0)
MCHC: 32.7 g/dL (ref 30.0–36.0)
MCV: 86.5 fL (ref 80.0–100.0)
Monocytes Absolute: 1 10*3/uL (ref 0.1–1.0)
Monocytes Relative: 8 %
Neutro Abs: 7.5 10*3/uL (ref 1.7–7.7)
Neutrophils Relative %: 63 %
Platelets: 269 10*3/uL (ref 150–400)
RBC: 4.45 MIL/uL (ref 3.87–5.11)
RDW: 14.9 % (ref 11.5–15.5)
WBC: 11.9 10*3/uL — ABNORMAL HIGH (ref 4.0–10.5)
nRBC: 0 % (ref 0.0–0.2)

## 2022-08-05 LAB — HEPATITIS PANEL, ACUTE
HCV Ab: NONREACTIVE
Hep A IgM: NONREACTIVE
Hep B C IgM: NONREACTIVE
Hepatitis B Surface Ag: NONREACTIVE

## 2022-08-05 LAB — PHOSPHORUS: Phosphorus: 3.7 mg/dL (ref 2.5–4.6)

## 2022-08-05 LAB — MAGNESIUM: Magnesium: 2.3 mg/dL (ref 1.7–2.4)

## 2022-08-05 NOTE — TOC Progression Note (Signed)
Transition of Care [facility]) - Progression Note    Patient Details  Name: Anna Berry MRN: [id number] Date of Birth: [birth year]  [facility]) CM/SW Contact  Kermit Balo, RN Phone Number: 08/05/2022, 2:14 PM  Clinical Narrative:    CM spoke with Velna Hatchet at [facility] IR. They have received pts information and are following. They need to see that she is able to eat enough calories to get cortrak out. If not she will need a PEG.  CM will follow up with Velna Hatchet on Monday.  TOC following.  Expected Discharge Plan: IP Rehab Facility Barriers to Discharge: Continued Medical Work up  Expected Discharge Plan and Services   Discharge Planning Services: CM Consult Post Acute Care Choice: IP Rehab Living arrangements for the past 2 months: Single Family Home                                       Social Determinants of Health (SDOH) Interventions SDOH Screenings   Food Insecurity: Patient Unable To Answer (07/25/2022)  Housing: Low Risk  (07/25/2022)  Transportation Needs: Patient Unable To Answer (07/25/2022)  Utilities: Patient Unable To Answer (07/25/2022)  Depression (PHQ2-9): Low Risk  (03/27/2022)  Tobacco Use: Low Risk  (07/24/2022)    Readmission Risk Interventions     No data to display

## 2022-08-05 NOTE — Progress Notes (Signed)
Occupational Therapy Treatment Patient Details Name: Anna Berry MRN: [id number] DOB: 04-28-1960 Today's Date: 08/05/2022   History of present illness Pt is a 62 y.o. female who presented 07/25/22 with speech difficulties. MRI showed a L frontal ICH. Worsening neuro decline noted 5/11 after admitting pt. Repeat imaging showed new area of ICH lateral to current ICH with MLS 5 mm. PMH: Sickle cell trait, sigmoid diverticulosis, asthma, OA, obesity, pre-diabetes   OT comments  Patient seen with PT to address bed mobility, sitting/standing, and transfers. Patient demonstrating gains with bed mobility and mod assist to get to EOB with patient following directions 50% of time. Patient demonstrating right lateral leaning when sitting on EOB and improved following WB through LUE elbow. Patient was max assist +2 to stand into stedy from EOB. Patient taken to mirror at sink and continued to demonstrate right lateral leaning and mod assist +2 to stand from [place] pads. Patient will benefit from intensive inpatient follow up therapy, >3 hours/day to continue to address bed mobility, transfers, and self care. Acute OT to continue to follow.    Recommendations for follow up therapy are one component of a multi-disciplinary discharge planning process, led by the attending physician.  Recommendations may be updated based on patient status, additional functional criteria and insurance authorization.    Assistance Recommended at Discharge Frequent or constant Supervision/Assistance  Patient can return home with the following  Two people to help with walking and/or transfers;Two people to help with bathing/dressing/bathroom;Assistance with cooking/housework;Direct supervision/assist for medications management;Assistance with feeding;Assist for transportation;Direct supervision/assist for financial management;Help with stairs or ramp for entrance   Equipment Recommendations  Wheelchair (measurements OT);Wheelchair  cushion (measurements OT);Hospital bed;Other (comment)    Recommendations for Other Services Rehab consult    Precautions / Restrictions Precautions Precautions: Fall Precaution Comments: L wrist restraint, cortrak, SBP <169 Restrictions Weight Bearing Restrictions: No       Mobility Bed Mobility Overal bed mobility: Needs Assistance Bed Mobility: Supine to Sit     Supine to sit: HOB elevated, Mod assist     General bed mobility comments: frequent cues, used rails to assist with LUE    Transfers Overall transfer level: Needs assistance Equipment used: Ambulation equipment used Transfers: Sit to/from Stand, Bed to chair/wheelchair/BSC Sit to Stand: +2 physical assistance, Max assist, Mod assist           General transfer comment: Max assist +2 to stand from EOB and mod assist +2 to stand from [employer] via Lift Equipment: Stedy   Balance Overall balance assessment: Needs assistance Sitting-balance support: Feet supported, No upper extremity supported Sitting balance-Leahy Scale: Poor Sitting balance - Comments: right lateral lean, Improved following WB through LUE while on EOB Postural control: Right lateral lean Standing balance support: During functional activity, Bilateral upper extremity supported Standing balance-Leahy Scale: Poor Standing balance comment: mod assist +2 to stand in [place] with assistance to maintain up right posture due to right lateral  leaning                           ADL either performed or assessed with clinical judgement   ADL Overall ADL's : Needs assistance/impaired     Grooming: Moderate assistance;Sitting Grooming Details (indicate cue type and reason): HOH assist to initiate                               General ADL  Comments: Patient seated on Stedy at sink to perform grooming    Extremity/Trunk Assessment              Vision       Perception     Praxis      Cognition  Arousal/Alertness: Awake/alert Behavior During Therapy: Flat affect Overall Cognitive Status: Difficult to assess Area of Impairment: Following commands                   Current Attention Level: Focused   Following Commands: Follows one step commands inconsistently   Awareness: Intellectual   General Comments: following one step commands 50% of time        Exercises      Shoulder Instructions       General Comments      Pertinent Vitals/ Pain       Pain Assessment Pain Assessment: Faces Faces Pain Scale: Hurts a little bit Pain Location: rubbing low back/buttocks Pain Descriptors / Indicators: Discomfort, Grimacing Pain Intervention(s): Monitored during session, Repositioned  Home Living                                          Prior Functioning/Environment              Frequency  Min 2X/week        Progress Toward Goals  OT Goals(current goals can now be found in the care plan section)  Progress towards OT goals: Progressing toward goals  Acute Rehab OT Goals OT Goal Formulation: Patient unable to participate in goal setting Time For Goal Achievement: 08/10/22 Potential to Achieve Goals: Good ADL Goals Pt Will Perform Grooming: with mod assist;sitting Pt Will Perform Upper Body Dressing: with mod assist;sitting Pt Will Transfer to Toilet: with max assist;stand pivot transfer;bedside commode Additional ADL Goal #1: Pt will complete bed mobility with mod A  as a precursor to ADLs Additional ADL Goal #2: Pt will follow simple 1 step commands 50% of attempts to sequence through ADL task  Plan Discharge plan remains appropriate    Co-evaluation    PT/OT/SLP Co-Evaluation/Treatment: Yes Reason for Co-Treatment: Complexity of the patient's impairments (multi-system involvement);Necessary to address cognition/behavior during functional activity;For patient/therapist safety;To address functional/ADL transfers   OT goals  addressed during session: ADL's and self-care;Strengthening/ROM      AM-PAC OT "6 Clicks" Daily Activity     Outcome Measure   Help from another [place] eating meals?: Total Help from another [place] taking care of personal grooming?: A Lot Help from another [place] toileting, which includes using toliet, bedpan, or urinal?: Total Help from another [place] bathing (including washing, rinsing, drying)?: Total Help from another [place] to put on and taking off regular upper body clothing?: Total Help from another [place] to put on and taking off regular lower body clothing?: Total 6 Click Score: 7    End of Session Equipment Utilized During Treatment: Gait belt;Other (comment) Antony Salmon)  OT Visit Diagnosis: Unsteadiness on feet (R26.81);Other abnormalities of gait and mobility (R26.89);Muscle weakness (generalized) (M62.81);Hemiplegia and hemiparesis Hemiplegia - Right/Left: Right Hemiplegia - dominant/non-dominant: Dominant Hemiplegia - caused by: Other Nontraumatic intracranial hemorrhage   Activity Tolerance Patient tolerated treatment well   Patient Left in chair;with call bell/phone within reach;with chair alarm set;with restraints reapplied   Nurse Communication Mobility status;Precautions;Need for lift equipment        Time: 1610-9604 OT Time Calculation (min): 30  min  Charges: OT General Charges $OT Visit: 1 Visit OT Treatments $Self Care/Home Management : 8-22 mins  Alfonse Flavors, OTA Acute Rehabilitation Services  Office [phone]   Dewain Penning 08/05/2022, 11:14 AM

## 2022-08-05 NOTE — Progress Notes (Signed)
Calorie Count Note - Day 1 Results   48 hour calorie count ordered.  Diet: Dysphagia 2, thin liquids  Supplements: Ensure Enlive - BID  Estimated Nutritional Needs:  Kcal:  1750-1950 Protein:  90-110 grams Fluid:  1.7-1.9 L  5/20 Dinner: 216 kcal, 7 gm protein 5/21 Breakfast: 123 kcal, 1.5 gm protein 5/21 Lunch: 187 kcal, 12 gm protein Supplements: 1 Ensure given, unsure completion  Total intake: 526 kcal (30% of minimum estimated needs)  20.5 protein (23% of minimum estimated needs)  Nutrition Diagnosis: Inadequate oral intake related to lethargy/confusion, inability to eat as evidenced by NPO status.   Goal: Patient will meet greater than or equal to 90% of their needs   Intervention:  Continue PO diet per SLP 48-hour kcal count to determine if pt is meeting nutrition needs Ensure Enlive po BID, each supplement provides 350 kcal and 20 grams of protein. Nocturnal feeds of Glucerna 1.5 at 70 ml/hr x 12 hours (840 ml/day) - free water flush of q4h - Tube feeding regimen at goal rate provides 1260 kcal, 69 grams of protein, and 638 ml of H2O.(1811mL = TF+flush)   Kirby Crigler RD, LDN Clinical Dietitian See [facility] for contact information.

## 2022-08-05 NOTE — Plan of Care (Signed)
  Problem: Nutrition: Goal: Risk of aspiration will decrease Outcome: Not Progressing   Problem: Clinical Measurements: Goal: Will remain free from infection Outcome: Not Progressing   Problem: Elimination: Goal: Will not experience complications related to urinary retention Outcome: Not Progressing   Problem: Skin Integrity: Goal: Risk for impaired skin integrity will decrease Outcome: Not Progressing

## 2022-08-05 NOTE — Progress Notes (Signed)
Speech Language Pathology Treatment: Dysphagia, Aphasia  Patient Details Name: Anna Berry MRN: [id number] DOB: 18-Feb-1961 Today's Date: 08/05/2022 Time: 7829-5621 SLP Time Calculation (min) (ACUTE ONLY): 25 min  Assessment / Plan / Recommendation Clinical Impression  Pt was seen for treatment and she was alert throughout the session. Pt followed 1-step commands with 25% accuracy given repetition and tactile cues. Vocalization was noted once on command, but no linguistic output was elicited during automatic sequences/confrontational naming despite verbal prompts and orthographic cues. A communication board was introduced, but pt was unable to identify photos/words. The functional utility of this is therefore in augmenting pt's communication, but staff may be able to use this to augmentatively to enhance pt's comprehension of their instructions/questions. Pt tolerated puree solids, dysphagia 3 solids, and thin liquids via straw without overt s/s of aspiration. Mastication was prolonged and mild-moderate pocketing was noted in the right lateral sulcus. Residue was reduced, but not eliminated with a liquid wash. [name] diet will be advanced to dysphagia 2 solids and thin liquids. SLP will continue to follow pt.     HPI HPI: Pt is a 62 y.o. female who presented with difficulty speaking. MRI brain 5/11: Large area of hemorrhage (approximately 18 mL) in the left frontal lobe, within the anterior cerebral artery territory. Pt passed [facility] 5/11, but then had neuro changes and could not complete a repeat [facility] due to lethargy. PMH: sickle cell trait, sigmoid diverticulosis, asthma, OA; BSE completed on 07/26/22 with NPO recommendation. ST f/u for SLE and po readiness.      SLP Plan  Continue with current plan of care      Recommendations for follow up therapy are one component of a multi-disciplinary discharge planning process, led by the attending physician.  Recommendations may be updated based on  patient status, additional functional criteria and insurance authorization.    Recommendations  Diet recommendations: Dysphagia 2 (fine chop);Thin liquid Liquids provided via: Cup;Straw Medication Administration: Crushed with puree Supervision: Staff to assist with self feeding Compensations: Slow rate;Small sips/bites;Lingual sweep for clearance of pocketing;Follow solids with liquid Postural Changes and/or Swallow Maneuvers: Seated upright 90 degrees                  Oral care QID;Staff/trained caregiver to provide oral care   Frequent or constant Supervision/Assistance Dysphagia, oral phase (R13.11)     Continue with current plan of care    Anna Berry Clock, MS, CCC-SLP Neuro Diagnostic Specialist  Acute Rehabilitation Services Office number: [phone]  Scheryl Marten  08/05/2022, 10:21 AM

## 2022-08-05 NOTE — Progress Notes (Signed)
PROGRESS NOTE  Anna Berry ZOX:[id number] DOB: 06-18-1960   PCP: Maury Dus, MD  Patient is from:   DOA: 07/24/2022 LOS: 11  Chief complaints Chief Complaint  Patient presents with   Altered Mental Status     Brief Narrative / Interim history: 62 year old F with PMH of asthma, diverticulosis, sickle cell trait and osteoarthritis brought to ED by EMS as code stroke due to inability to speak, and found to have left frontal ICH with left ACA distribution with concern for hemorrhagic transformation.  Done, she had new frontal ICH lateral to the previous hematoma.  Eventually, she was transferred out of ICU to hospitalist service on 5/15.   Hospital course complicated by fever and UTI for which she is on IV ceftriaxone for total of 7 days.  Patient remains confused and aphasic.  She has had Cortrak for tube feed.  She has been upgraded to dysphagia 2 diet on 5/22.  Monitoring p.o. intake.  Therapy recommended CIR.  She has no medical insurance.  Subjective: Seen and examined earlier this morning.  No major events overnight or this morning.  Sitting on bedside chair.  She does not talk or interact.  Barely follows command by squeezing with left hand.  Objective: Vitals:   08/05/22 0337 08/05/22 0700 08/05/22 0744 08/05/22 1125  BP:  130/72 106/61 109/76  Pulse:  83 87 96  Resp:  18 18 18   Temp:  98.6 F (37 C) 98.4 F (36.9 C) 98.2 F (36.8 C)  TempSrc:  Axillary Oral Oral  SpO2:  100% 96% 98%  Weight: 87.9 kg     Height:        Examination:  GENERAL: No apparent distress.  Nontoxic. HEENT: MMM.  Cortrack in place. NECK: Supple.  No apparent JVD.  RESP:  No IWOB.  Fair aeration bilaterally. CVS:  RRR. Heart sounds normal.  ABD/GI/GU: BS+. Abd soft, NTND.  MSK/EXT:  Moves extremities. No apparent deformity. No edema.  SKIN: no apparent skin lesion or wound NEURO: Awake and alert.  Barely follows command.  Able to squeeze my finger with left hand.  No facial  asymmetry. PSYCH: Calm. Normal affect.   Procedures:  None  Microbiology summarized: 5/11-MRSA PCR screen nonreactive 5/16 GIP nonreactive 5/15-urine culture with E. coli and Klebsiella pneumonia 5/15-blood cultures NGTD  Assessment and plan: Principal Problem:   ICH (intracerebral hemorrhage) (HCC)  Intracranial hemorrhage: Initial imaging showed left frontal ICH with left ACA distribution with concern for hemorrhagic transformation.  Repeat CT head 2 days later with a stable left frontal hemorrhage and edema with mild subarachnoid extension, and midline shift about 5 mm.  CTV did not show venous thrombosis.  EEG negative for seizure.  TTE without significant finding.  LDL 105.  A1c 6.0%. -PT/OT recommended CIR but patient does not have medical insurance. -Tube feed via Cortrak.  Also upgraded to dysphagia 2 diet on 5/22.  -SLP following. -Hold Crestor due to rising LFT   Elevated blood pressure: Required Cleviprex in ICU.  Normotensive now. -Continue current regimen   Hyperlipidemia -Hold Crestor due to rising LFT   Dysphagia: On cortrak TF and dysphagia 2 diet. -Monitor oral intake  Hypernatremia: Resolved.  Fever/E. coli and Klebsiella pneumonia UTI -Continue ceftriaxone until 5/23.  Abnormal LFTS -Hold Crestor -Recheck in the morning  Lactic Acidosis: Resolved.   GERD/GI prophylaxis -Continue PPI.   Obesity/inadequate oral intake Body mass index is 31.28 kg/m. Nutrition Problem: Inadequate oral intake Etiology: lethargy/confusion, inability to eat Signs/Symptoms: NPO status  Interventions: Tube feeding, Refer to RD note for recommendations   DVT prophylaxis:  heparin injection 5,000 Units Start: 07/27/22 1300 SCD's Start: 07/25/22 0359  Code Status: Full code Family Communication: None at bedside Level of care: Telemetry Medical Status is: Inpatient Remains inpatient appropriate because: Intracranial hemorrhage, dysphagia, UTI   Final disposition:  CIR? Consultants:  Neurology  35 minutes with more than 50% spent in reviewing records, counseling patient/family and coordinating care.   Sch Meds:  Scheduled Meds:  Chlorhexidine Gluconate Cloth  6 each Topical Q0600   feeding supplement  237 mL Oral BID BM   feeding supplement (GLUCERNA 1.5 CAL)  1,000 mL Per Tube Q24H   free water  200 mL Per Tube Q4H   heparin injection (subcutaneous)  5,000 Units Subcutaneous Q8H   insulin aspart  0-9 Units Subcutaneous Q4H   insulin aspart  2 Units Subcutaneous Q4H   mouth rinse  15 mL Mouth Rinse 4 times per day   pantoprazole (PROTONIX) IV  40 mg Intravenous QHS   polyethylene glycol  17 g Per Tube BID   rosuvastatin  20 mg Per Tube Daily   senna-docusate  1 tablet Per Tube BID   sodium chloride flush  10-40 mL Intracatheter Q12H   Continuous Infusions:  cefTRIAXone (ROCEPHIN)  IV 200 mL/hr at 08/05/22 0014   PRN Meds:.albuterol, labetalol, mouth rinse, sodium chloride flush, white petrolatum  Antimicrobials: Anti-infectives (From admission, onward)    Start     Dose/Rate Route Frequency Ordered Stop   07/31/22 1600  cefTRIAXone (ROCEPHIN) 1 g in sodium chloride 0.9 % 100 mL IVPB        1 g 200 mL/hr over 30 Minutes Intravenous Every 24 hours 07/31/22 1503 08/06/22 2359        I have personally reviewed the following labs and images: CBC: Recent Labs  Lab 08/01/22 0408 08/02/22 0440 08/03/22 0530 08/04/22 0505 08/05/22 0500  WBC 10.3 9.5 10.1 11.3* 11.9*  NEUTROABS 6.7 5.9 6.2 7.1 7.5  HGB 14.0 12.8 12.6 12.5 12.6  HCT 43.8 40.6 39.4 38.3 38.5  MCV 88.0 89.2 87.8 85.1 86.5  PLT 254 245 242 231 269   BMP &GFR Recent Labs  Lab 08/01/22 0408 08/02/22 0440 08/03/22 0530 08/04/22 0505 08/05/22 0500  NA 150* 145 140 140 138  K 4.2 3.9 4.1 4.2 4.0  CL 109 107 103 105 102  CO2 30 30 29 28 27   GLUCOSE 168* 214* 248* 231* 184*  BUN 28* 23 18 19 20   CREATININE 1.18* 1.12* 0.98 0.98 1.06*  CALCIUM 9.3 9.0 8.9 9.0  9.0  MG 3.0* 2.7* 2.2 2.2 2.3  PHOS 4.5 4.0 3.6 3.3 3.7   Estimated Creatinine Clearance: 60.6 mL/min (A) (by C-G formula based on SCr of 1.06 mg/dL (H)). Liver & Pancreas: Recent Labs  Lab 08/01/22 0408 08/02/22 0440 08/03/22 0530 08/04/22 0505 08/05/22 0500  AST 34 47* 55* 62* 91*  ALT 60* 74* 96* 111* 156*  ALKPHOS 82 83 82 96 103  BILITOT 0.5 0.7 0.4 0.3 0.3  PROT 7.0 6.5 6.6 6.7 6.5  ALBUMIN 3.3* 3.0* 3.0* 2.9* 3.1*   No results for input(s): "LIPASE", "AMYLASE" in the last 168 hours. No results for input(s): "AMMONIA" in the last 168 hours. Diabetic: No results for input(s): "HGBA1C" in the last 72 hours. Recent Labs  Lab 08/04/22 1952 08/04/22 2331 08/05/22 0328 08/05/22 0744 08/05/22 1124  GLUCAP 125* 116* 211* 207* 146*   Cardiac Enzymes: No results for input(s): "  CKTOTAL", "CKMB", "CKMBINDEX", "TROPONINI" in the last 168 hours. No results for input(s): "PROBNP" in the last 8760 hours. Coagulation Profile: No results for input(s): "INR", "PROTIME" in the last 168 hours. Thyroid Function Tests: No results for input(s): "TSH", "T4TOTAL", "FREET4", "T3FREE", "THYROIDAB" in the last 72 hours. Lipid Profile: No results for input(s): "CHOL", "HDL", "LDLCALC", "TRIG", "CHOLHDL", "LDLDIRECT" in the last 72 hours. Anemia Panel: No results for input(s): "VITAMINB12", "FOLATE", "FERRITIN", "TIBC", "IRON", "RETICCTPCT" in the last 72 hours. Urine analysis:    Component Value Date/Time   COLORURINE YELLOW 07/29/2022 1110   APPEARANCEUR HAZY (A) 07/29/2022 1110   LABSPEC 1.013 07/29/2022 1110   PHURINE 6.0 07/29/2022 1110   GLUCOSEU NEGATIVE 07/29/2022 1110   GLUCOSEU NEG mg/dL 29/56/2130 8657   HGBUR MODERATE (A) 07/29/2022 1110   HGBUR moderate 03/31/2010 0935   BILIRUBINUR NEGATIVE 07/29/2022 1110   BILIRUBINUR negative 03/27/2022 1404   BILIRUBINUR NEG 07/19/2015 0916   KETONESUR NEGATIVE 07/29/2022 1110   PROTEINUR 30 (A) 07/29/2022 1110   UROBILINOGEN 0.2  03/27/2022 1404   UROBILINOGEN 0.2 05/26/2010 1856   NITRITE NEGATIVE 07/29/2022 1110   LEUKOCYTESUR MODERATE (A) 07/29/2022 1110   Sepsis Labs: Invalid input(s): "PROCALCITONIN", "LACTICIDVEN"  Microbiology: Recent Results (from the past 240 hour(s))  Culture, blood (Routine X 2) w Reflex to ID Panel     Status: None   Collection Time: 07/29/22 12:04 PM   Specimen: BLOOD RIGHT HAND  Result Value Ref Range Status   Specimen Description BLOOD RIGHT HAND  Final   Special Requests   Final    BOTTLES DRAWN AEROBIC AND ANAEROBIC Blood Culture adequate volume   Culture   Final    NO GROWTH 5 DAYS Performed at [facility], [address], [place], [place] [address]    Report Status 08/03/2022 FINAL  Final  Culture, blood (Routine X 2) w Reflex to ID Panel     Status: None   Collection Time: 07/29/22 12:04 PM   Specimen: BLOOD RIGHT ARM  Result Value Ref Range Status   Specimen Description BLOOD RIGHT ARM  Final   Special Requests   Final    BOTTLES DRAWN AEROBIC AND ANAEROBIC Blood Culture results may not be optimal due to an inadequate volume of blood received in culture bottles   Culture   Final    NO GROWTH 5 DAYS Performed at [facility], [address] [place], [place] [address]    Report Status 08/03/2022 FINAL  Final  Urine Culture (for pregnant, neutropenic or urologic patients or patients with an indwelling urinary catheter)     Status: Abnormal   Collection Time: 07/29/22  6:08 PM   Specimen: Urine, Clean Catch  Result Value Ref Range Status   Specimen Description URINE, CLEAN CATCH  Final   Special Requests   Final    NONE Performed at [facility], [address] [place], [place] [address]    Culture (A)  Final    >=100,000 COLONIES/mL ESCHERICHIA COLI 20,000 COLONIES/mL KLEBSIELLA PNEUMONIAE    Report Status 08/02/2022 FINAL  Final   Organism ID, Bacteria ESCHERICHIA COLI (A)  Final   Organism ID, Bacteria KLEBSIELLA PNEUMONIAE (A)  Final       Susceptibility   Escherichia coli - MIC*    AMPICILLIN >=32 RESISTANT Resistant     CEFAZOLIN <=4 SENSITIVE Sensitive     CEFEPIME <=0.12 SENSITIVE Sensitive     CEFTRIAXONE <=0.25 SENSITIVE Sensitive     CIPROFLOXACIN >=4  RESISTANT Resistant     GENTAMICIN 4 SENSITIVE Sensitive     IMIPENEM <=0.25 SENSITIVE Sensitive     NITROFURANTOIN <=16 SENSITIVE Sensitive     TRIMETH/SULFA <=20 SENSITIVE Sensitive     AMPICILLIN/SULBACTAM >=32 RESISTANT Resistant     PIP/TAZO <=4 SENSITIVE Sensitive     * >=100,000 COLONIES/mL ESCHERICHIA COLI   Klebsiella pneumoniae - MIC*    AMPICILLIN >=32 RESISTANT Resistant     CEFAZOLIN <=4 SENSITIVE Sensitive     CEFEPIME <=0.12 SENSITIVE Sensitive     CEFTRIAXONE <=0.25 SENSITIVE Sensitive     CIPROFLOXACIN <=0.25 SENSITIVE Sensitive     GENTAMICIN <=1 SENSITIVE Sensitive     IMIPENEM <=0.25 SENSITIVE Sensitive     NITROFURANTOIN 64 INTERMEDIATE Intermediate     TRIMETH/SULFA <=20 SENSITIVE Sensitive     AMPICILLIN/SULBACTAM 4 SENSITIVE Sensitive     PIP/TAZO <=4 SENSITIVE Sensitive     * 20,000 COLONIES/mL KLEBSIELLA PNEUMONIAE  Gastrointestinal Panel by PCR , Stool     Status: None   Collection Time: 07/30/22  8:34 AM   Specimen: Stool  Result Value Ref Range Status   Campylobacter species NOT DETECTED NOT DETECTED Final   Plesimonas shigelloides NOT DETECTED NOT DETECTED Final   Salmonella species NOT DETECTED NOT DETECTED Final   Yersinia enterocolitica NOT DETECTED NOT DETECTED Final   Vibrio species NOT DETECTED NOT DETECTED Final   Vibrio cholerae NOT DETECTED NOT DETECTED Final   Enteroaggregative E coli (EAEC) NOT DETECTED NOT DETECTED Final   Enteropathogenic E coli (EPEC) NOT DETECTED NOT DETECTED Final   Enterotoxigenic E coli (ETEC) NOT DETECTED NOT DETECTED Final   Shiga like toxin producing E coli (STEC) NOT DETECTED NOT DETECTED Final   Shigella/Enteroinvasive E coli (EIEC) NOT DETECTED NOT DETECTED Final   Cryptosporidium  NOT DETECTED NOT DETECTED Final   Cyclospora cayetanensis NOT DETECTED NOT DETECTED Final   Entamoeba histolytica NOT DETECTED NOT DETECTED Final   Giardia lamblia NOT DETECTED NOT DETECTED Final   Adenovirus F40/41 NOT DETECTED NOT DETECTED Final   Astrovirus NOT DETECTED NOT DETECTED Final   Norovirus GI/GII NOT DETECTED NOT DETECTED Final   Rotavirus A NOT DETECTED NOT DETECTED Final   Sapovirus (I, II, IV, and V) NOT DETECTED NOT DETECTED Final    Comment: Performed at [facility], [address] [place], [place] [address]    Radiology Studies: US Abdomen Limited RUQ (LIVER/GB)  Result Date: 08/04/2022 CLINICAL DATA:  Abnormal LFTs. EXAM: ULTRASOUND ABDOMEN LIMITED RIGHT UPPER QUADRANT COMPARISON:  Ultrasound dated 05/24/2008. FINDINGS: Gallbladder: No gallstones or wall thickening visualized. No sonographic Murphy sign noted by sonographer. Common bile duct: Diameter: 3 mm Liver: There is diffuse increased liver echogenicity most commonly seen in the setting of fatty infiltration. Superimposed inflammation or fibrosis is not excluded. Clinical correlation is recommended. Portal vein is patent on color Doppler imaging with normal direction of blood flow towards the liver. Other: None. IMPRESSION: Fatty liver, otherwise unremarkable right upper quadrant ultrasound. Electronically Signed   By: Elgie Collard M.D.   On: 08/04/2022 23:41      Sahith Nurse T. Burnadette Baskett [facility]  If 7PM-7AM, please contact night-coverage [email or web address] 08/05/2022, 2:47 PM

## 2022-08-05 NOTE — Progress Notes (Signed)
Physical Therapy Treatment Patient Details Name: Anna Berry MRN: [id number] DOB: [birth year] Today's Date: 08/05/2022   History of Present Illness Pt is a 62 y.o. female who presented 07/25/22 with speech difficulties. MRI showed a L frontal ICH. Worsening neuro decline noted 5/11 after admitting pt. Repeat imaging showed new area of ICH lateral to current ICH with MLS 5 mm. PMH: Sickle cell trait, sigmoid diverticulosis, asthma, OA, obesity, pre-diabetes    PT Comments    Pt treated in conjunction with OT to safely progress OOB mobility. Pt continues to not attempt to vocalize, but was following ~50% of cues today and was more alert/awake. She required only modA to transition supine to sit EOB but mod-maxAx2 to transfer to stand using the stedy, continuing to display a R lateral lean. Attempted to provide visual feedback using the mirror at the sink to facilitate increased awareness of her lean and correction to midline, but pt did not seem to comprehend or was unable to problem-solve how to fix it. She did look at herself in the mirror though, and further training with the mirror may be beneficial in assisting her in finding and maintaining midline in upright positions. Will continue to follow acutely.     Recommendations for follow up therapy are one component of a multi-disciplinary discharge planning process, led by the attending physician.  Recommendations may be updated based on patient status, additional functional criteria and insurance authorization.  Follow Up Recommendations       Assistance Recommended at Discharge Frequent or constant Supervision/Assistance  Patient can return home with the following Two people to help with walking and/or transfers;Two people to help with bathing/dressing/bathroom;Assistance with feeding;Assistance with cooking/housework;Direct supervision/assist for medications management;Direct supervision/assist for financial management;Assist for  transportation;Help with stairs or ramp for entrance   Equipment Recommendations  Hospital bed;Other (comment) (hoyer lift; tilt-in-space w/c)    Recommendations for Other Services       Precautions / Restrictions Precautions Precautions: Fall Precaution Comments: L wrist restraint, cortrak, SBP <169 Restrictions Weight Bearing Restrictions: No     Mobility  Bed Mobility Overal bed mobility: Needs Assistance Bed Mobility: Supine to Sit     Supine to sit: HOB elevated, Mod assist     General bed mobility comments: Tactile and verbal cues along with gestures utilized to prompt pt to bring legs off EOB and sit up L EOB, modA to manage R leg, ascend trunk, and scoot to EOB.    Transfers Overall transfer level: Needs assistance Equipment used: Ambulation equipment used Transfers: Sit to/from Stand, Bed to chair/wheelchair/BSC Sit to Stand: +2 physical assistance, Max assist, Mod assist           General transfer comment: Max assist +2 to stand from EOB and mod assist +2 to stand from [place] pads. Needs tactile cues to flex L knee to sit on stedy flaps. Transfer via Lift Equipment: Stedy  Ambulation/Gait               General Gait Details: unable   Social research officer, government Rankin (Stroke Patients Only) Modified Rankin (Stroke Patients Only) Pre-Morbid Rankin Score: No symptoms Modified Rankin: Severe disability     Balance Overall balance assessment: Needs assistance Sitting-balance support: Feet supported, No upper extremity supported Sitting balance-Leahy Scale: Poor Sitting balance - Comments: right lateral lean needing modA initially, Improved following WB through LUE while on EOB needing min guard with intermittent  minA Postural control: Right lateral lean Standing balance support: During functional activity, Bilateral upper extremity supported Standing balance-Leahy Scale: Poor Standing balance comment: mod assist  +2 to stand in [place] with assistance to maintain up right posture due to right lateral  leaning                            Cognition Arousal/Alertness: Awake/alert Behavior During Therapy: Flat affect Overall Cognitive Status: Difficult to assess Area of Impairment: Following commands                   Current Attention Level: Focused   Following Commands: Follows one step commands inconsistently   Awareness: Intellectual   General Comments: following one step commands 50% of time        Exercises Other Exercises Other Exercises: sat on stedy flaps in front of mirror at sink to try to provide visual feedback to reduce R lean, but poor success noted    General Comments        Pertinent Vitals/Pain Pain Assessment Pain Assessment: Faces Faces Pain Scale: Hurts a little bit Pain Location: rubbing low back/buttocks Pain Descriptors / Indicators: Discomfort, Grimacing Pain Intervention(s): Limited activity within patient's tolerance, Monitored during session, Repositioned    Home Living                          Prior Function            PT Goals (current goals can now be found in the care plan section) Acute Rehab PT Goals Patient Stated Goal: did not state; family hopes for pt to improve PT Goal Formulation: Patient unable to participate in goal setting Time For Goal Achievement: 08/09/22 Potential to Achieve Goals: Good Progress towards PT goals: Progressing toward goals    Frequency    Min 4X/week      PT Plan Current plan remains appropriate    Co-evaluation PT/OT/SLP Co-Evaluation/Treatment: Yes Reason for Co-Treatment: Complexity of the patient's impairments (multi-system involvement);Necessary to address cognition/behavior during functional activity;For patient/therapist safety;To address functional/ADL transfers PT goals addressed during session: Mobility/safety with mobility;Balance;Proper use of DME OT goals addressed  during session: ADL's and self-care;Strengthening/ROM      AM-PAC PT "6 Clicks" Mobility   Outcome Measure  Help needed turning from your back to your side while in a flat bed without using bedrails?: Total Help needed moving from lying on your back to sitting on the side of a flat bed without using bedrails?: A Lot Help needed moving to and from a bed to a chair (including a wheelchair)?: Total Help needed standing up from a chair using your arms (e.g., wheelchair or bedside chair)?: Total Help needed to walk in hospital room?: Total Help needed climbing 3-5 steps with a railing? : Total 6 Click Score: 7    End of Session Equipment Utilized During Treatment: Gait belt Activity Tolerance: Patient tolerated treatment well Patient left: in chair;with call bell/phone within reach;with chair alarm set Nurse Communication: Mobility status;Need for lift equipment PT Visit Diagnosis: Unsteadiness on feet (R26.81);Muscle weakness (generalized) (M62.81);Difficulty in walking, not elsewhere classified (R26.2);Other symptoms and signs involving the nervous system (R29.898);Hemiplegia and hemiparesis Hemiplegia - Right/Left: Right Hemiplegia - dominant/non-dominant: Dominant Hemiplegia - caused by: Other Nontraumatic intracranial hemorrhage     Time: 0815-0848 PT Time Calculation (min) (ACUTE ONLY): 33 min  Charges:  $Therapeutic Activity: 8-22 mins  Raymond Gurney, PT, DPT Acute Rehabilitation Services  Office: [phone]    Anna Berry 08/05/2022, 1:39 PM

## 2022-08-06 DIAGNOSIS — R1310 Dysphagia, unspecified: Secondary | ICD-10-CM | POA: Diagnosis not present

## 2022-08-06 DIAGNOSIS — B9620 Unspecified Escherichia coli [E. coli] as the cause of diseases classified elsewhere: Secondary | ICD-10-CM | POA: Diagnosis not present

## 2022-08-06 DIAGNOSIS — I611 Nontraumatic intracerebral hemorrhage in hemisphere, cortical: Secondary | ICD-10-CM | POA: Diagnosis not present

## 2022-08-06 DIAGNOSIS — N390 Urinary tract infection, site not specified: Secondary | ICD-10-CM | POA: Diagnosis not present

## 2022-08-06 LAB — GLUCOSE, CAPILLARY
Glucose-Capillary: 131 mg/dL — ABNORMAL HIGH (ref 70–99)
Glucose-Capillary: 128 mg/dL — ABNORMAL HIGH (ref 70–99)
Glucose-Capillary: 114 mg/dL — ABNORMAL HIGH (ref 70–99)
Glucose-Capillary: 96 mg/dL (ref 70–99)
Glucose-Capillary: 163 mg/dL — ABNORMAL HIGH (ref 70–99)
Glucose-Capillary: 109 mg/dL — ABNORMAL HIGH (ref 70–99)

## 2022-08-06 LAB — CK: Total CK: 102 U/L (ref 38–234)

## 2022-08-06 LAB — COMPREHENSIVE METABOLIC PANEL
ALT: 194 U/L — ABNORMAL HIGH (ref 0–44)
AST: 110 U/L — ABNORMAL HIGH (ref 15–41)
Albumin: 3.2 g/dL — ABNORMAL LOW (ref 3.5–5.0)
Alkaline Phosphatase: 108 U/L (ref 38–126)
Anion gap: 9 (ref 5–15)
BUN: 22 mg/dL (ref 8–23)
CO2: 28 mmol/L (ref 22–32)
Calcium: 9.3 mg/dL (ref 8.9–10.3)
Chloride: 105 mmol/L (ref 98–111)
Creatinine, Ser: 1.06 mg/dL — ABNORMAL HIGH (ref 0.44–1.00)
GFR, Estimated: 59 mL/min — ABNORMAL LOW (ref >60)
Glucose, Bld: 138 mg/dL — ABNORMAL HIGH (ref 70–99)
Potassium: 4.2 mmol/L (ref 3.5–5.1)
Sodium: 142 mmol/L (ref 135–145)
Total Bilirubin: 0.5 mg/dL (ref 0.3–1.2)
Total Protein: 6.9 g/dL (ref 6.5–8.1)

## 2022-08-06 LAB — PHOSPHORUS: Phosphorus: 4.5 mg/dL (ref 2.5–4.6)

## 2022-08-06 LAB — AMMONIA: Ammonia: 32 umol/L (ref 9–35)

## 2022-08-06 LAB — MAGNESIUM: Magnesium: 2.5 mg/dL — ABNORMAL HIGH (ref 1.7–2.4)

## 2022-08-06 MED ORDER — DOCUSATE SODIUM 50 MG/5ML PO LIQD
50.0000 mg | Freq: Two times a day (BID) | ORAL | Status: DC
  Administered 2022-08-06 – 2022-08-18 (×17): 50 mg
  Filled 2022-08-06 (×20): qty 10

## 2022-08-06 MED ORDER — SENNOSIDES 8.8 MG/5ML PO SYRP
5.0000 mL | ORAL_SOLUTION | Freq: Two times a day (BID) | ORAL | Status: DC
  Administered 2022-08-06 – 2022-08-18 (×17): 5 mL
  Filled 2022-08-06 (×20): qty 5

## 2022-08-06 NOTE — [place] FL2 (Signed)
[place] MEDICAID FL2 LEVEL OF CARE FORM     IDENTIFICATION  Patient Name: Anna Berry Birthdate: 12/11/1960 Sex: female Admission Date (Current Location): 07/24/2022  [facility] and [place] Number:  Producer, television/film/video and Address:  [facility], [address], [place], [place] [address]      Provider Number: [id number]  Attending Physician Name and Address:  Almon Hercules, MD  Relative Name and Phone Number:       Current Level of Care: Hospital Recommended Level of Care: Skilled Nursing Facility Prior Approval Number:    Date Approved/Denied:   PASRR Number: [id number]  Discharge Plan: SNF    Current Diagnoses: Patient Active Problem List   Diagnosis Date Noted   ICH (intracerebral hemorrhage) (HCC) 07/25/2022   Shoulder pain, right 09/15/2021   H/O: hysterectomy 09/12/2021   Chest pain due to GERD 09/08/2021   Acute cystitis with hematuria 06/30/2019   History of total knee replacement, 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   Obesity (BMI 30-39.9) 05/11/2012   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   Asthma 12/29/2009   Osteoarthritis of right knee 08/26/2009   ALLERGIC RHINITIS 05/29/2007   SICKLE CELL TRAIT 05/13/2006    Orientation RESPIRATION BLADDER Height & Weight      (disoriented)  Normal Incontinent Weight: 193 lb 5.5 oz (87.7 kg) Height:  5\' 6"  (167.6 cm)  BEHAVIORAL SYMPTOMS/MOOD NEUROLOGICAL BOWEL NUTRITION STATUS      Incontinent Feeding tube (Glucerna 1.5)  AMBULATORY STATUS COMMUNICATION OF NEEDS Skin   Extensive Assist Non-Verbally Normal                       Personal Care Assistance Level of Assistance  Bathing, Feeding, Dressing Bathing Assistance: Maximum assistance Feeding assistance: Maximum assistance Dressing Assistance: Maximum assistance     Functional Limitations Info  Speech Sight Info: Adequate Hearing Info: Adequate Speech Info: Impaired     SPECIAL CARE FACTORS FREQUENCY  PT (By licensed PT), OT (By licensed OT), Speech therapy     PT Frequency: 5x/wk OT Frequency: 5x/wk     Speech Therapy Frequency: 5x/wk      Contractures Contractures Info: Not present    Additional Factors Info  Code Status, Allergies, Insulin Sliding Scale Code Status Info: Full Allergies Info: Acetaminophen, Morphine And Codeine, Nsaids, Other (Tree nuts)   Insulin Sliding Scale Info: see DC summary       Current Medications (08/06/2022):  This is the current hospital active medication list Current Facility-Administered Medications  Medication Dose Route Frequency Provider Last Rate Last Admin   albuterol (PROVENTIL) (2.5 MG/3ML) 0.083% nebulizer solution 2.5 mg  2.5 mg Nebulization Q6H PRN Reome, Earle J, RPH       cefTRIAXone (ROCEPHIN) 1 g in sodium chloride 0.9 % 100 mL IVPB  1 g Intravenous Q24H Sheikh, Kateri Mc Latif, DO 200 mL/hr at 08/05/22 1738 1 g at 08/05/22 1738   Chlorhexidine Gluconate Cloth 2 % PADS 6 each  6 each Topical Q0600 Erick Blinks, MD   6 each at 08/06/22 0540   sennosides (SENOKOT) 8.8 MG/5ML syrup 5 mL  5 mL Per Tube BID Candelaria Stagers T, MD       And   docusate (COLACE) 50 MG/5ML liquid 50 mg  50 mg Per Tube BID Gonfa, Taye T, MD       feeding supplement (ENSURE ENLIVE / ENSURE PLUS) liquid 237 mL  237 mL Oral BID BM Sheikh,  Omair Latif, DO   237 mL at 08/06/22 1100   feeding supplement (GLUCERNA 1.5 CAL) liquid 1,000 mL  1,000 mL Per Tube Q24H Marguerita Merles [place], DO   1,000 mL at 08/05/22 2119   free water 200 mL  200 mL Per Tube Q4H Sheikh, Kateri Mc Christine, DO   200 mL at 08/06/22 1154   heparin injection 5,000 Units  5,000 Units Subcutaneous Buena Irish, MD   5,000 Units at 08/06/22 0538   insulin aspart (novoLOG) injection 0-9 Units  0-9 Units Subcutaneous Q4H Marguerita Merles [place], [place]   1 Units at 08/06/22 4259   insulin aspart (novoLOG) injection 2 Units  2 Units Subcutaneous Q4H Howerter, Justin B, DO   2  Units at 08/06/22 1153   labetalol (NORMODYNE) injection 10 mg  10 mg Intravenous Q2H PRN Erick Blinks, MD   10 mg at 07/29/22 0756   Oral care mouth rinse  15 mL Mouth Rinse 4 times per day Erick Blinks, MD   15 mL at 08/06/22 0836   Oral care mouth rinse  15 mL Mouth Rinse PRN Erick Blinks, MD       pantoprazole (PROTONIX) injection 40 mg  40 mg Intravenous QHS Erick Blinks, MD   40 mg at 08/05/22 2119   polyethylene glycol (MIRALAX / GLYCOLAX) packet 17 g  17 g Per Tube BID Marvel Plan, MD   17 g at 08/06/22 1133   sodium chloride flush (NS) 0.9 % injection 10-40 mL  10-40 mL Intracatheter Q12H Marvel Plan, MD   10 mL at 08/06/22 1136   sodium chloride flush (NS) 0.9 % injection 10-40 mL  10-40 mL Intracatheter PRN Marvel Plan, MD       white petrolatum (VASELINE) gel   Topical PRN Marvel Plan, MD         Discharge Medications: Please see discharge summary for a list of discharge medications.  Relevant Imaging Results:  Relevant Lab Results:   Additional Information SS#: [id number]  Baldemar Lenis, LCSW

## 2022-08-06 NOTE — Plan of Care (Signed)
  Problem: Activity: Goal: Risk for activity intolerance will decrease 08/06/2022 1812 by Wylie Hail, RN Outcome: Progressing 08/06/2022 1812 by Wylie Hail, RN Outcome: Progressing   Problem: Coping: Goal: Level of anxiety will decrease 08/06/2022 1812 by Wylie Hail, RN Outcome: Progressing 08/06/2022 1812 by Wylie Hail, RN Outcome: Progressing   Problem: Elimination: Goal: Will not experience complications related to bowel motility 08/06/2022 1812 by Wylie Hail, RN Outcome: Progressing 08/06/2022 1812 by Wylie Hail, RN Outcome: Progressing Goal: Will not experience complications related to urinary retention 08/06/2022 1812 by Wylie Hail, RN Outcome: Progressing 08/06/2022 1812 by Wylie Hail, RN Outcome: Progressing   Problem: Pain Managment: Goal: General experience of comfort will improve 08/06/2022 1812 by Wylie Hail, RN Outcome: Progressing 08/06/2022 1812 by Wylie Hail, RN Outcome: Progressing   Problem: Safety: Goal: Ability to remain free from injury will improve 08/06/2022 1812 by Wylie Hail, RN Outcome: Progressing 08/06/2022 1812 by Wylie Hail, RN Outcome: Progressing   Problem: Skin Integrity: Goal: Risk for impaired skin integrity will decrease 08/06/2022 1812 by Wylie Hail, RN Outcome: Progressing 08/06/2022 1812 by Wylie Hail, RN Outcome: Progressing   Problem: Skin Integrity: Goal: Risk for impaired skin integrity will decrease Outcome: Progressing   Problem: Tissue Perfusion: Goal: Adequacy of tissue perfusion will improve Outcome: Progressing

## 2022-08-06 NOTE — TOC Progression Note (Signed)
Transition of Care [facility]) - Progression Note    Patient Details  Name: Anna Berry MRN: [id number] Date of Birth: 1960-04-17  Transition of [facility]) CM/SW Contact  Baldemar Lenis, [place] Phone Number: 08/06/2022, 12:03 PM  Clinical Narrative:   CSW spoke with patient's spouse, Anna Berry, to discuss AIR vs SNF. Per Anna Berry, he is concerned about the quick turn around with AIR because he is the only one at home able to help care for the patient. Patient's mother was previously at [facility] for about three months and was able to leave there walking, he would prefer to try that if possible. CSW sent referral to [facility], updated [place] [facility] that family would like to pursue SNF instead. CSW to follow.    Expected Discharge Plan: Skilled Nursing Facility Barriers to Discharge: Continued Medical Work up, English as a second language teacher  Expected Discharge Plan and Services   Discharge Planning Services: CM Consult Post Acute Care Choice: IP Rehab Living arrangements for the past 2 months: Single Family Home                                       Social Determinants of Health (SDOH) Interventions SDOH Screenings   Food Insecurity: Patient Unable To Answer (07/25/2022)  Housing: Low Risk  (07/25/2022)  Transportation Needs: Patient Unable To Answer (07/25/2022)  Utilities: Patient Unable To Answer (07/25/2022)  Depression (PHQ2-9): Low Risk  (03/27/2022)  Tobacco Use: Low Risk  (07/24/2022)    Readmission Risk Interventions     No data to display

## 2022-08-06 NOTE — Progress Notes (Signed)
Physical Therapy Treatment Patient Details Name: Anna Berry MRN: [id number] DOB: 1960/10/27 Today's Date: 08/06/2022   History of Present Illness Pt is a 62 y.o. female who presented 07/25/22 with speech difficulties. MRI showed a L frontal ICH. Worsening neuro decline noted 5/11 after admitting pt. Repeat imaging showed new area of ICH lateral to current ICH with MLS 5 mm. PMH: Sickle cell trait, sigmoid diverticulosis, asthma, OA, obesity, pre-diabetes    PT Comments    Pt continues to display good, gradual progress towards her PT goals. She was awake, attending to her friend on her L and smiling upon arrival. When the brighter lights were turned on she began to get red and tearful at the eyes and would rub them. This ceased when they were turned off and a soft light was turned on instead, thus pt may be potentially sensitive to light at this time. She continues to not attempt to vocalize anything and is inconsistent in following commands, but benefits from gestures or displays of the cues for her to follow. Focused session on improving OOB mobility and on improving her balance in sitting and standing, providing the mirror for visual feedback to facilitate increased midline orientation. This seemed to improve her midline alignment in sitting but instead she pushed posteriorly. However, in standing she would lean to the R again and push posteriorly. She is continuing to display R lower extremity hypertonicity and require mod-maxA for bed mobility, transfers, and standing balance at this time. Per chart, family has decided to pursue less intensive inpatient rehab options, <3 hours/day. Will continue to follow acutely.     Recommendations for follow up therapy are one component of a multi-disciplinary discharge planning process, led by the attending physician.  Recommendations may be updated based on patient status, additional functional criteria and insurance authorization.  Follow Up  Recommendations  Can patient physically be transported by private vehicle: No    Assistance Recommended at Discharge Frequent or constant Supervision/Assistance  Patient can return home with the following Two people to help with walking and/or transfers;Two people to help with bathing/dressing/bathroom;Assistance with feeding;Assistance with cooking/housework;Direct supervision/assist for medications management;Direct supervision/assist for financial management;Assist for transportation;Help with stairs or ramp for entrance   Equipment Recommendations  Hospital bed;Other (comment) (hoyer lift; tilt-in-space w/c)    Recommendations for Other Services       Precautions / Restrictions Precautions Precautions: Fall Precaution Comments: L wrist restraint, cortrak, SBP <169 Restrictions Weight Bearing Restrictions: No     Mobility  Bed Mobility Overal bed mobility: Needs Assistance Bed Mobility: Supine to Sit, Sit to Supine, Rolling Rolling: Max assist   Supine to sit: HOB elevated, Mod assist Sit to supine: Max assist, HOB elevated   General bed mobility comments: Tactile and verbal cues along with gestures utilized to prompt pt to bring legs off EOB and sit up L EOB, modA to manage R leg, ascend trunk, and scoot to EOB. MaxA to lift legs and prop pt on L elbow to descend trunk back to supine. MaxA flexing leg and guiding arms to roll either direction for bedding change.    Transfers Overall transfer level: Needs assistance Equipment used: Ambulation equipment used Transfers: Sit to/from Stand Sit to Stand: Max assist, From elevated surface           General transfer comment: MaxA to initiate and power up to stand from elevated EOB to stedy 2x, displaying a R lateral lean and flexed posture while pushing self posteriorly with L extremities. Transfer  via Lift Equipment: Stedy  Ambulation/Gait               General Gait Details: unable   Metallurgist Rankin (Stroke Patients Only) Modified Rankin (Stroke Patients Only) Pre-Morbid Rankin Score: No symptoms Modified Rankin: Severe disability     Balance Overall balance assessment: Needs assistance Sitting-balance support: Feet supported, No upper extremity supported, Single extremity supported Sitting balance-Leahy Scale: Fair Sitting balance - Comments: Pt pushing self posteriorly today, not so much to R. Mirror utilized anterior to her to provide visual feedback which seemed to assist with midline orientation but tactile cues and guidance of L UE towards her knees needed to facilitate flexion of her trunk and reduction of her posterior lean. Initially required modA but progressed to min guard-minA. Postural control: Right lateral lean, Posterior lean Standing balance support: During functional activity, Bilateral upper extremity supported Standing balance-Leahy Scale: Poor Standing balance comment: MaxA initially to stand in stedy with flexed posture, R lean, and pushing self posteriorly with L extremities. Progressed to modA for balance on second rep, standing for up to ~1.5 min duration.                            Cognition Arousal/Alertness: Awake/alert Behavior During Therapy: Flat affect (smiled 2x at start of session) Overall Cognitive Status: Difficult to assess Area of Impairment: Following commands, Attention, Awareness, Safety/judgement, Problem solving                   Current Attention Level: Sustained   Following Commands: Follows one step commands inconsistently, Follows one step commands with increased time Safety/Judgement: Decreased awareness of safety, Decreased awareness of deficits Awareness: Intellectual Problem Solving: Slow processing, Difficulty sequencing, Requires verbal cues, Requires tactile cues, Decreased initiation General Comments: Pt benefits from gestures or visual display of what PT is  asking pt to do for her to follow cues more consistently. Poor awareness of her tendency to push herself posteriorly.        Exercises Other Exercises Other Exercises: visual scanning to R to find friend in room, needing light tactile cues at head to turn    General Comments General comments (skin integrity, edema, etc.): friend present and supportive      Pertinent Vitals/Pain Pain Assessment Pain Assessment: Faces Faces Pain Scale: Hurts little more Pain Location: cortrak; potentially eyes with lights? Pain Descriptors / Indicators: Discomfort, Grimacing Pain Intervention(s): Limited activity within patient's tolerance, Monitored during session, Repositioned, Other (comment) (turned down lights)    Home Living                          Prior Function            PT Goals (current goals can now be found in the care plan section) Acute Rehab PT Goals Patient Stated Goal: did not state; family hopes for pt to improve PT Goal Formulation: Patient unable to participate in goal setting Time For Goal Achievement: 08/09/22 Potential to Achieve Goals: Good Progress towards PT goals: Progressing toward goals    Frequency    Min 3X/week      PT Plan Discharge plan needs to be updated;Frequency needs to be updated    Co-evaluation              AM-PAC PT "6 Clicks" Mobility  Outcome Measure  Help needed turning from your back to your side while in a flat bed without using bedrails?: A Lot Help needed moving from lying on your back to sitting on the side of a flat bed without using bedrails?: A Lot Help needed moving to and from a bed to a chair (including a wheelchair)?: Total Help needed standing up from a chair using your arms (e.g., wheelchair or bedside chair)?: A Lot Help needed to walk in hospital room?: Total Help needed climbing 3-5 steps with a railing? : Total 6 Click Score: 9    End of Session Equipment Utilized During Treatment: Gait  belt Activity Tolerance: Patient tolerated treatment well Patient left: with call bell/phone within reach;in bed;with bed alarm set;with family/visitor present;with nursing/sitter in room Nurse Communication: Mobility status PT Visit Diagnosis: Unsteadiness on feet (R26.81);Muscle weakness (generalized) (M62.81);Difficulty in walking, not elsewhere classified (R26.2);Other symptoms and signs involving the nervous system (R29.898);Hemiplegia and hemiparesis Hemiplegia - Right/Left: Right Hemiplegia - dominant/non-dominant: Dominant Hemiplegia - caused by: Other Nontraumatic intracranial hemorrhage     Time: 1610-9604 PT Time Calculation (min) (ACUTE ONLY): 38 min  Charges:  $Therapeutic Activity: 23-37 mins $Neuromuscular Re-education: 8-22 mins                     Raymond Gurney, PT, DPT Acute Rehabilitation Services  Office: [phone]    Jewel Baize 08/06/2022, 5:24 PM

## 2022-08-06 NOTE — Progress Notes (Signed)
PROGRESS NOTE  Anna Berry ZOX:[id number] DOB: 09-15-1960   PCP: Maury Dus, MD  Patient is from:   DOA: 07/24/2022 LOS: 12  Chief complaints Chief Complaint  Patient presents with   Altered Mental Status     Brief Narrative / Interim history: 62 year old F with PMH of asthma, diverticulosis, sickle cell trait and osteoarthritis brought to ED by EMS as code stroke due to inability to speak, and found to have left frontal ICH with left ACA distribution with concern for hemorrhagic transformation.  Done, she had new frontal ICH lateral to the previous hematoma.  Eventually, she was transferred out of ICU to hospitalist service on 5/15.   Hospital course complicated by fever and UTI for which she is on IV ceftriaxone for total of 7 days.  Patient remains confused and aphasic.  She has had Cortrak for tube feed.  She has been upgraded to dysphagia 2 diet on 5/22.  Monitoring p.o. intake.  Therapy recommended CIR.  She has no medical insurance.  Subjective: Seen and examined earlier this morning.  No major events overnight of this morning.  Daughter, husband and granddaughter at bedside.  Per family, she raised her right arm earlier this morning.  Patient does not talk but follows some commands.   Objective: Vitals:   08/06/22 0406 08/06/22 0500 08/06/22 0801 08/06/22 1125  BP: 104/74  106/74 110/71  Pulse: 95  89 88  Resp: 18  18 17   Temp: 98.3 F (36.8 C)  98.7 F (37.1 C) 98.4 F (36.9 C)  TempSrc: Oral  Oral Oral  SpO2: 96%  98% 99%  Weight:  87.7 kg    Height:        Examination:  GENERAL: No apparent distress.  Nontoxic. HEENT: MMM.  Vision and hearing grossly intact.  Cortrak in place. NECK: Supple.  No apparent JVD.  RESP:  No IWOB.  Fair aeration bilaterally. CVS:  RRR. Heart sounds normal.  ABD/GI/GU: BS+. Abd soft, NTND.  MSK/EXT:   Moves left arm.  Right hemiparesis.  Slightly moves right leg. SKIN: no apparent skin lesion or wound NEURO: Awake and  alert.  Not able to assess orientation.  Follows commands.  Right facial droop and right hemiparesis. PSYCH: Calm. Normal affect.   Procedures:  None  Microbiology summarized: 5/11-MRSA PCR screen nonreactive 5/16 GIP nonreactive 5/15-urine culture with E. coli and Klebsiella pneumonia 5/15-blood cultures NGTD  Assessment and plan: Principal Problem:   ICH (intracerebral hemorrhage) (HCC)  Intracranial hemorrhage: Initial imaging showed left frontal ICH with left ACA distribution with concern for hemorrhagic transformation.  Repeat CT head 2 days later with a stable left frontal hemorrhage and edema with mild subarachnoid extension, and midline shift about 5 mm.  CTV did not show venous thrombosis.  EEG negative for seizure.  TTE without significant finding.  LDL 105.  A1c 6.0%. -PT/OT recommended CIR but patient does not have medical insurance. -Tube feed via Cortrak.  Also upgraded to dysphagia-2 diet on 5/22.  -SLP following. -Hold Crestor due to rising LFT   Elevated blood pressure: Required Cleviprex in ICU.  Normotensive now. -Continue current regimen   Hyperlipidemia -Hold Crestor due to rising LFT   Dysphagia: On cortrak TF and dysphagia 2 diet. -Monitor oral intake  Hypernatremia: Resolved.  Fever/E. coli and Klebsiella pneumonia UTI: Last fever on 5/15. -Continue ceftriaxone until 5/23.  Abnormal LFTS -Hold Crestor -Recheck in the morning  Lactic Acidosis: Resolved.   GERD/GI prophylaxis -Continue PPI.   Obesity/inadequate oral intake  Body mass index is 31.21 kg/m. Nutrition Problem: Inadequate oral intake Etiology: lethargy/confusion, inability to eat Signs/Symptoms: NPO status Interventions: Tube feeding, Refer to RD note for recommendations   DVT prophylaxis:  heparin injection 5,000 Units Start: 07/27/22 1300 SCD's Start: 07/25/22 0359  Code Status: Full code Family Communication: Updated patient's husband and daughter at bedside. Level of  care: Telemetry Medical Status is: Inpatient Remains inpatient appropriate because: Intracranial hemorrhage, dysphagia, UTI   Final disposition: CIR? Consultants:  Neurology  35 minutes with more than 50% spent in reviewing records, counseling patient/family and coordinating care.   Sch Meds:  Scheduled Meds:  Chlorhexidine Gluconate Cloth  6 each Topical Q0600   sennosides  5 mL Per Tube BID   And   docusate  50 mg Per Tube BID   feeding supplement  237 mL Oral BID BM   feeding supplement (GLUCERNA 1.5 CAL)  1,000 mL Per Tube Q24H   free water  200 mL Per Tube Q4H   heparin injection (subcutaneous)  5,000 Units Subcutaneous Q8H   insulin aspart  0-9 Units Subcutaneous Q4H   insulin aspart  2 Units Subcutaneous Q4H   mouth rinse  15 mL Mouth Rinse 4 times per day   pantoprazole (PROTONIX) IV  40 mg Intravenous QHS   polyethylene glycol  17 g Per Tube BID   sodium chloride flush  10-40 mL Intracatheter Q12H   Continuous Infusions:  cefTRIAXone (ROCEPHIN)  IV 1 g (08/05/22 1738)   PRN Meds:.albuterol, labetalol, mouth rinse, sodium chloride flush, white petrolatum  Antimicrobials: Anti-infectives (From admission, onward)    Start     Dose/Rate Route Frequency Ordered Stop   07/31/22 1600  cefTRIAXone (ROCEPHIN) 1 g in sodium chloride 0.9 % 100 mL IVPB        1 g 200 mL/hr over 30 Minutes Intravenous Every 24 hours 07/31/22 1503 08/06/22 2359        I have personally reviewed the following labs and images: CBC: Recent Labs  Lab 08/01/22 0408 08/02/22 0440 08/03/22 0530 08/04/22 0505 08/05/22 0500  WBC 10.3 9.5 10.1 11.3* 11.9*  NEUTROABS 6.7 5.9 6.2 7.1 7.5  HGB 14.0 12.8 12.6 12.5 12.6  HCT 43.8 40.6 39.4 38.3 38.5  MCV 88.0 89.2 87.8 85.1 86.5  PLT 254 245 242 231 269   BMP &GFR Recent Labs  Lab 08/02/22 0440 08/03/22 0530 08/04/22 0505 08/05/22 0500 08/06/22 0500  NA 145 140 140 138 142  K 3.9 4.1 4.2 4.0 4.2  CL 107 103 105 102 105  CO2 30 29  28 27 28   GLUCOSE 214* 248* 231* 184* 138*  BUN 23 18 19 20 22   CREATININE 1.12* 0.98 0.98 1.06* 1.06*  CALCIUM 9.0 8.9 9.0 9.0 9.3  MG 2.7* 2.2 2.2 2.3 2.5*  PHOS 4.0 3.6 3.3 3.7 4.5   Estimated Creatinine Clearance: 60.6 mL/min (A) (by C-G formula based on SCr of 1.06 mg/dL (H)). Liver & Pancreas: Recent Labs  Lab 08/02/22 0440 08/03/22 0530 08/04/22 0505 08/05/22 0500 08/06/22 0500  AST 47* 55* 62* 91* 110*  ALT 74* 96* 111* 156* 194*  ALKPHOS 83 82 96 103 108  BILITOT 0.7 0.4 0.3 0.3 0.5  PROT 6.5 6.6 6.7 6.5 6.9  ALBUMIN 3.0* 3.0* 2.9* 3.1* 3.2*   No results for input(s): "LIPASE", "AMYLASE" in the last 168 hours. Recent Labs  Lab 08/06/22 0500  AMMONIA 32   Diabetic: No results for input(s): "HGBA1C" in the last 72 hours. Recent Labs  Lab 08/05/22 2034 08/05/22 2340 08/06/22 0407 08/06/22 0802 08/06/22 1124  GLUCAP 102* 119* 131* 128* 114*   Cardiac Enzymes: Recent Labs  Lab 08/06/22 0500  CKTOTAL 102   No results for input(s): "PROBNP" in the last 8760 hours. Coagulation Profile: No results for input(s): "INR", "PROTIME" in the last 168 hours. Thyroid Function Tests: No results for input(s): "TSH", "T4TOTAL", "FREET4", "T3FREE", "THYROIDAB" in the last 72 hours. Lipid Profile: No results for input(s): "CHOL", "HDL", "LDLCALC", "TRIG", "CHOLHDL", "LDLDIRECT" in the last 72 hours. Anemia Panel: No results for input(s): "VITAMINB12", "FOLATE", "FERRITIN", "TIBC", "IRON", "RETICCTPCT" in the last 72 hours. Urine analysis:    Component Value Date/Time   COLORURINE YELLOW 07/29/2022 1110   APPEARANCEUR HAZY (A) 07/29/2022 1110   LABSPEC 1.013 07/29/2022 1110   PHURINE 6.0 07/29/2022 1110   GLUCOSEU NEGATIVE 07/29/2022 1110   GLUCOSEU NEG mg/dL 28/41/3244 0102   HGBUR MODERATE (A) 07/29/2022 1110   HGBUR moderate 03/31/2010 0935   BILIRUBINUR NEGATIVE 07/29/2022 1110   BILIRUBINUR negative 03/27/2022 1404   BILIRUBINUR NEG 07/19/2015 0916    KETONESUR NEGATIVE 07/29/2022 1110   PROTEINUR 30 (A) 07/29/2022 1110   UROBILINOGEN 0.2 03/27/2022 1404   UROBILINOGEN 0.2 05/26/2010 1856   NITRITE NEGATIVE 07/29/2022 1110   LEUKOCYTESUR MODERATE (A) 07/29/2022 1110   Sepsis Labs: Invalid input(s): "PROCALCITONIN", "LACTICIDVEN"  Microbiology: Recent Results (from the past 240 hour(s))  Culture, blood (Routine X 2) w Reflex to ID Panel     Status: None   Collection Time: 07/29/22 12:04 PM   Specimen: BLOOD RIGHT HAND  Result Value Ref Range Status   Specimen Description BLOOD RIGHT HAND  Final   Special Requests   Final    BOTTLES DRAWN AEROBIC AND ANAEROBIC Blood Culture adequate volume   Culture   Final    NO GROWTH 5 DAYS Performed at [facility], [address], [place], [place] [address]    Report Status 08/03/2022 FINAL  Final  Culture, blood (Routine X 2) w Reflex to ID Panel     Status: None   Collection Time: 07/29/22 12:04 PM   Specimen: BLOOD RIGHT ARM  Result Value Ref Range Status   Specimen Description BLOOD RIGHT ARM  Final   Special Requests   Final    BOTTLES DRAWN AEROBIC AND ANAEROBIC Blood Culture results may not be optimal due to an inadequate volume of blood received in culture bottles   Culture   Final    NO GROWTH 5 DAYS Performed at [facility], [address], [place], [place] [address]    Report Status 08/03/2022 FINAL  Final  Urine Culture (for pregnant, neutropenic or urologic patients or patients with an indwelling urinary catheter)     Status: Abnormal   Collection Time: 07/29/22  6:08 PM   Specimen: Urine, Clean Catch  Result Value Ref Range Status   Specimen Description URINE, CLEAN CATCH  Final   Special Requests   Final    NONE Performed at [facility], [address], [place], [place] [address]    Culture (A)  Final    >=100,000 COLONIES/mL ESCHERICHIA COLI 20,000 COLONIES/mL KLEBSIELLA PNEUMONIAE    Report Status 08/02/2022 FINAL  Final   Organism ID,  Bacteria ESCHERICHIA COLI (A)  Final   Organism ID, Bacteria KLEBSIELLA PNEUMONIAE (A)  Final      Susceptibility   Escherichia coli - MIC*    AMPICILLIN >=32 RESISTANT Resistant     CEFAZOLIN <=4  SENSITIVE Sensitive     CEFEPIME <=0.12 SENSITIVE Sensitive     CEFTRIAXONE <=0.25 SENSITIVE Sensitive     CIPROFLOXACIN >=4 RESISTANT Resistant     GENTAMICIN 4 SENSITIVE Sensitive     IMIPENEM <=0.25 SENSITIVE Sensitive     NITROFURANTOIN <=16 SENSITIVE Sensitive     TRIMETH/SULFA <=20 SENSITIVE Sensitive     AMPICILLIN/SULBACTAM >=32 RESISTANT Resistant     PIP/TAZO <=4 SENSITIVE Sensitive     * >=100,000 COLONIES/mL ESCHERICHIA COLI   Klebsiella pneumoniae - MIC*    AMPICILLIN >=32 RESISTANT Resistant     CEFAZOLIN <=4 SENSITIVE Sensitive     CEFEPIME <=0.12 SENSITIVE Sensitive     CEFTRIAXONE <=0.25 SENSITIVE Sensitive     CIPROFLOXACIN <=0.25 SENSITIVE Sensitive     GENTAMICIN <=1 SENSITIVE Sensitive     IMIPENEM <=0.25 SENSITIVE Sensitive     NITROFURANTOIN 64 INTERMEDIATE Intermediate     TRIMETH/SULFA <=20 SENSITIVE Sensitive     AMPICILLIN/SULBACTAM 4 SENSITIVE Sensitive     PIP/TAZO <=4 SENSITIVE Sensitive     * 20,000 COLONIES/mL KLEBSIELLA PNEUMONIAE  Gastrointestinal Panel by PCR , Stool     Status: None   Collection Time: 07/30/22  8:34 AM   Specimen: Stool  Result Value Ref Range Status   Campylobacter species NOT DETECTED NOT DETECTED Final   Plesimonas shigelloides NOT DETECTED NOT DETECTED Final   Salmonella species NOT DETECTED NOT DETECTED Final   Yersinia enterocolitica NOT DETECTED NOT DETECTED Final   Vibrio species NOT DETECTED NOT DETECTED Final   Vibrio cholerae NOT DETECTED NOT DETECTED Final   Enteroaggregative E coli (EAEC) NOT DETECTED NOT DETECTED Final   Enteropathogenic E coli (EPEC) NOT DETECTED NOT DETECTED Final   Enterotoxigenic E coli (ETEC) NOT DETECTED NOT DETECTED Final   Shiga like toxin producing E coli (STEC) NOT DETECTED NOT DETECTED  Final   Shigella/Enteroinvasive E coli (EIEC) NOT DETECTED NOT DETECTED Final   Cryptosporidium NOT DETECTED NOT DETECTED Final   Cyclospora cayetanensis NOT DETECTED NOT DETECTED Final   Entamoeba histolytica NOT DETECTED NOT DETECTED Final   Giardia lamblia NOT DETECTED NOT DETECTED Final   Adenovirus F40/41 NOT DETECTED NOT DETECTED Final   Astrovirus NOT DETECTED NOT DETECTED Final   Norovirus GI/GII NOT DETECTED NOT DETECTED Final   Rotavirus A NOT DETECTED NOT DETECTED Final   Sapovirus (I, II, IV, and V) NOT DETECTED NOT DETECTED Final    Comment: Performed at [facility], [address], [place], [place] [address]    Radiology Studies: No results found.    Reno Clasby T. Caitland Porchia [facility]  If 7PM-7AM, please contact night-coverage [email or web address] 08/06/2022, 12:31 PM

## 2022-08-06 NOTE — Progress Notes (Signed)
Calorie Count Note - Day 2 Results   48-hour calorie count ordered.  Pt continues to have inadequate intake of meals/supplements and is not meeting nutrition needs without the use of nocturnal TF. Discussed with family friend that in order to discharge to inpatient rehab at outside facility, pt will need to be able to sustain herself orally or move to a more permanent tube placement. Will monitor for one more day.  Diet: Dysphagia 2, thin liquids  Supplements: Ensure Enlive - BID  Estimated Nutritional Needs:  Kcal:  1750-1950 Protein:  90-110 grams Fluid:  1.7-1.9 L  5/21 Dinner: 297 kcal, 13 gm protein 5/22 Lunch: No documentation 5/23 Breakfast: 138 kcal, 8 gm protein Supplements: 1 Ensure given, unsure completion  Total intake: 435 kcal (25% of minimum estimated needs)  21 protein (23% of minimum estimated needs)  Nutrition Diagnosis: Inadequate oral intake related to lethargy/confusion, inability to eat as evidenced by NPO status.   Goal: Patient will meet greater than or equal to 90% of their needs   Intervention:  Continue PO diet per SLP 48-hour kcal count to determine if pt is meeting nutrition needs Ensure Enlive po BID, each supplement provides 350 kcal and 20 grams of protein. Nocturnal feeds of Glucerna 1.5 at 70 ml/hr x 12 hours (840 ml/day) - free water flush of q4h - Tube feeding regimen at goal rate provides 1260 kcal, 69 grams of protein, and 638 ml of H2O.(1843mL = TF+flush)    Greig Castilla, RD, LDN Clinical Dietitian RD pager # available in AMION  After hours/weekend pager # available in [facility]

## 2022-08-07 DIAGNOSIS — I611 Nontraumatic intracerebral hemorrhage in hemisphere, cortical: Secondary | ICD-10-CM | POA: Diagnosis not present

## 2022-08-07 DIAGNOSIS — N390 Urinary tract infection, site not specified: Secondary | ICD-10-CM | POA: Diagnosis not present

## 2022-08-07 DIAGNOSIS — B9620 Unspecified Escherichia coli [E. coli] as the cause of diseases classified elsewhere: Secondary | ICD-10-CM | POA: Diagnosis not present

## 2022-08-07 DIAGNOSIS — R1310 Dysphagia, unspecified: Secondary | ICD-10-CM | POA: Diagnosis not present

## 2022-08-07 LAB — PHOSPHORUS: Phosphorus: 4.3 mg/dL (ref 2.5–4.6)

## 2022-08-07 LAB — COMPREHENSIVE METABOLIC PANEL
ALT: 221 U/L — ABNORMAL HIGH (ref 0–44)
AST: 94 U/L — ABNORMAL HIGH (ref 15–41)
Albumin: 3.3 g/dL — ABNORMAL LOW (ref 3.5–5.0)
Alkaline Phosphatase: 110 U/L (ref 38–126)
Anion gap: 12 (ref 5–15)
BUN: 22 mg/dL (ref 8–23)
CO2: 29 mmol/L (ref 22–32)
Calcium: 9.9 mg/dL (ref 8.9–10.3)
Chloride: 102 mmol/L (ref 98–111)
Creatinine, Ser: 1 mg/dL (ref 0.44–1.00)
GFR, Estimated: >60 mL/min (ref >60)
Glucose, Bld: 111 mg/dL — ABNORMAL HIGH (ref 70–99)
Potassium: 4.2 mmol/L (ref 3.5–5.1)
Sodium: 143 mmol/L (ref 135–145)
Total Bilirubin: 0.3 mg/dL (ref 0.3–1.2)
Total Protein: 7.1 g/dL (ref 6.5–8.1)

## 2022-08-07 LAB — GLUCOSE, CAPILLARY
Glucose-Capillary: 139 mg/dL — ABNORMAL HIGH (ref 70–99)
Glucose-Capillary: 140 mg/dL — ABNORMAL HIGH (ref 70–99)
Glucose-Capillary: 142 mg/dL — ABNORMAL HIGH (ref 70–99)
Glucose-Capillary: 147 mg/dL — ABNORMAL HIGH (ref 70–99)
Glucose-Capillary: 151 mg/dL — ABNORMAL HIGH (ref 70–99)
Glucose-Capillary: 159 mg/dL — ABNORMAL HIGH (ref 70–99)
Glucose-Capillary: 177 mg/dL — ABNORMAL HIGH (ref 70–99)

## 2022-08-07 LAB — CBC WITH DIFFERENTIAL/PLATELET
Abs Immature Granulocytes: 0.06 10*3/uL (ref 0.00–0.07)
Basophils Absolute: 0.1 10*3/uL (ref 0.0–0.1)
Basophils Relative: 1 %
Eosinophils Absolute: 0.4 10*3/uL (ref 0.0–0.5)
Eosinophils Relative: 4 %
HCT: 40.8 % (ref 36.0–46.0)
Hemoglobin: 13.4 g/dL (ref 12.0–15.0)
Immature Granulocytes: 1 %
Lymphocytes Relative: 22 %
Lymphs Abs: 2 10*3/uL (ref 0.7–4.0)
MCH: 28.2 pg (ref 26.0–34.0)
MCHC: 32.8 g/dL (ref 30.0–36.0)
MCV: 85.7 fL (ref 80.0–100.0)
Monocytes Absolute: 0.9 10*3/uL (ref 0.1–1.0)
Monocytes Relative: 10 %
Neutro Abs: 5.6 10*3/uL (ref 1.7–7.7)
Neutrophils Relative %: 62 %
Platelets: 308 10*3/uL (ref 150–400)
RBC: 4.76 MIL/uL (ref 3.87–5.11)
RDW: 15.1 % (ref 11.5–15.5)
WBC: 9 10*3/uL (ref 4.0–10.5)
nRBC: 0 % (ref 0.0–0.2)

## 2022-08-07 LAB — MAGNESIUM: Magnesium: 2.6 mg/dL — ABNORMAL HIGH (ref 1.7–2.4)

## 2022-08-07 NOTE — Progress Notes (Signed)
PROGRESS NOTE  Anna Berry ZOX:[id number] DOB: 1960/05/27   PCP: Maury Dus, MD  Patient is from:   DOA: 07/24/2022 LOS: 13  Chief complaints Chief Complaint  Patient presents with   Altered Mental Status     Brief Narrative / Interim history: 62 year old F with PMH of asthma, diverticulosis, sickle cell trait and osteoarthritis brought to ED by EMS as code stroke due to inability to speak, and found to have left frontal ICH with left ACA distribution with concern for hemorrhagic transformation.  Done, she had new frontal ICH lateral to the previous hematoma.  Eventually, she was transferred out of ICU to hospitalist service on 5/15.   Hospital course complicated by fever and UTI for which she is on IV ceftriaxone for total of 7 days.  Patient remains confused and aphasic.  She has had Cortrak for tube feed.  She has been upgraded to dysphagia 2 diet on 5/22.  Tube feed decreased to nocturnal but p.o. intake remains poor after 48-hour calorie count.  Ongoing discussion about G-tube with family.  Subjective: Seen and examined earlier this morning.  No major events overnight of this morning.  Awake and alert but nonverbal.  She barely follows commands.  No apparent distress.  Objective: Vitals:   08/07/22 0337 08/07/22 0426 08/07/22 0853 08/07/22 1117  BP: 103/72  113/70 108/74  Pulse: 90  91 90  Resp: 16  18 18   Temp: 98 F (36.7 C)  98.9 F (37.2 C) 98.7 F (37.1 C)  TempSrc:   Oral Oral  SpO2: 96%  96% 99%  Weight:  87.7 kg    Height:        Examination:  GENERAL: No apparent distress.  Nontoxic. HEENT: MMM.  Vision and hearing grossly intact.  Cortrak in place. NECK: Supple.  No apparent JVD.  RESP:  No IWOB.  Fair aeration bilaterally. CVS:  RRR. Heart sounds normal.  ABD/GI/GU: BS+. Abd soft, NTND.  MSK/EXT:   Moves left arm.  Right hemiparesis.  Slightly moves right leg. SKIN: no apparent skin lesion or wound NEURO: Awake and alert.  Nonverbal.   Barely follows commands.  Moves LUE.  Wiggles her toes. PSYCH: Calm. Normal affect.   Procedures:  None  Microbiology summarized: 5/11-MRSA PCR screen nonreactive 5/16 GIP nonreactive 5/15-urine culture with E. coli and Klebsiella pneumonia 5/15-blood cultures NGTD  Assessment and plan: Principal Problem:   ICH (intracerebral hemorrhage) (HCC)  Intracranial hemorrhage: Initial imaging showed left frontal ICH with left ACA distribution with concern for hemorrhagic transformation.  Repeat CT head 2 days later with a stable left frontal hemorrhage and edema with mild subarachnoid extension, and midline shift about 5 mm.  CTV did not show venous thrombosis.  EEG negative for seizure.  TTE without significant finding.  LDL 105.  A1c 6.0%. -Continue PT/OT  Dysphagia: On cortrak TF and dysphagia 2 diet.  P.o. intake remains poor after 48-hour calorie count. -Have discussed G-tube with patient's husband who is going to discuss with his children before making decision -Monitor oral intake   Elevated blood pressure: Required Cleviprex in ICU.  Normotensive now. -Continue current regimen   Hyperlipidemia -Hold Crestor due to rising LFT   Hypernatremia: Resolved.  Fever/E. coli and Klebsiella pneumonia UTI: Last fever on 5/15. -Completed antibiotic course with IV ceftriaxone on 5/23.   Abnormal LFTS -Continue holding Crestor. -Recheck in the morning  Lactic Acidosis: Resolved.   GERD/GI prophylaxis -Continue PPI.   Obesity/inadequate oral intake Body mass index is 31.21  kg/m. Nutrition Problem: Inadequate oral intake Etiology: lethargy/confusion, inability to eat Signs/Symptoms: NPO status Interventions: Tube feeding, Refer to RD note for recommendations   DVT prophylaxis:  heparin injection 5,000 Units Start: 07/27/22 1300 SCD's Start: 07/25/22 0359  Code Status: Full code Family Communication: Updated patient's husband over the phone. Level of care: Telemetry  Medical Status is: Inpatient Remains inpatient appropriate because: Intracranial hemorrhage and dysphagia   Final disposition: SNF Consultants:  Neurology  35 minutes with more than 50% spent in reviewing records, counseling patient/family and coordinating care.   Sch Meds:  Scheduled Meds:  Chlorhexidine Gluconate Cloth  6 each Topical Q0600   sennosides  5 mL Per Tube BID   And   docusate  50 mg Per Tube BID   feeding supplement  237 mL Oral BID BM   feeding supplement (GLUCERNA 1.5 CAL)  1,000 mL Per Tube Q24H   free water  200 mL Per Tube Q4H   heparin injection (subcutaneous)  5,000 Units Subcutaneous Q8H   insulin aspart  0-9 Units Subcutaneous Q4H   insulin aspart  2 Units Subcutaneous Q4H   mouth rinse  15 mL Mouth Rinse 4 times per day   pantoprazole (PROTONIX) IV  40 mg Intravenous QHS   polyethylene glycol  17 g Per Tube BID   sodium chloride flush  10-40 mL Intracatheter Q12H   Continuous Infusions:   PRN Meds:.albuterol, labetalol, mouth rinse, sodium chloride flush, white petrolatum  Antimicrobials: Anti-infectives (From admission, onward)    Start     Dose/Rate Route Frequency Ordered Stop   07/31/22 1600  cefTRIAXone (ROCEPHIN) 1 g in sodium chloride 0.9 % 100 mL IVPB        1 g 200 mL/hr over 30 Minutes Intravenous Every 24 hours 07/31/22 1503 08/06/22 1737        I have personally reviewed the following labs and images: CBC: Recent Labs  Lab 08/01/22 0408 08/02/22 0440 08/03/22 0530 08/04/22 0505 08/05/22 0500  WBC 10.3 9.5 10.1 11.3* 11.9*  NEUTROABS 6.7 5.9 6.2 7.1 7.5  HGB 14.0 12.8 12.6 12.5 12.6  HCT 43.8 40.6 39.4 38.3 38.5  MCV 88.0 89.2 87.8 85.1 86.5  PLT 254 245 242 231 269   BMP &GFR Recent Labs  Lab 08/02/22 0440 08/03/22 0530 08/04/22 0505 08/05/22 0500 08/06/22 0500  NA 145 140 140 138 142  K 3.9 4.1 4.2 4.0 4.2  CL 107 103 105 102 105  CO2 30 29 28 27 28   GLUCOSE 214* 248* 231* 184* 138*  BUN 23 18 19 20 22    CREATININE 1.12* 0.98 0.98 1.06* 1.06*  CALCIUM 9.0 8.9 9.0 9.0 9.3  MG 2.7* 2.2 2.2 2.3 2.5*  PHOS 4.0 3.6 3.3 3.7 4.5   Estimated Creatinine Clearance: 60.6 mL/min (A) (by C-G formula based on SCr of 1.06 mg/dL (H)). Liver & Pancreas: Recent Labs  Lab 08/02/22 0440 08/03/22 0530 08/04/22 0505 08/05/22 0500 08/06/22 0500  AST 47* 55* 62* 91* 110*  ALT 74* 96* 111* 156* 194*  ALKPHOS 83 82 96 103 108  BILITOT 0.7 0.4 0.3 0.3 0.5  PROT 6.5 6.6 6.7 6.5 6.9  ALBUMIN 3.0* 3.0* 2.9* 3.1* 3.2*   No results for input(s): "LIPASE", "AMYLASE" in the last 168 hours. Recent Labs  Lab 08/06/22 0500  AMMONIA 32   Diabetic: No results for input(s): "HGBA1C" in the last 72 hours. Recent Labs  Lab 08/06/22 2011 08/06/22 2320 08/07/22 0336 08/07/22 0829 08/07/22 1119  GLUCAP 163* 109*  140* 151* 159*   Cardiac Enzymes: Recent Labs  Lab 08/06/22 0500  CKTOTAL 102   No results for input(s): "PROBNP" in the last 8760 hours. Coagulation Profile: No results for input(s): "INR", "PROTIME" in the last 168 hours. Thyroid Function Tests: No results for input(s): "TSH", "T4TOTAL", "FREET4", "T3FREE", "THYROIDAB" in the last 72 hours. Lipid Profile: No results for input(s): "CHOL", "HDL", "LDLCALC", "TRIG", "CHOLHDL", "LDLDIRECT" in the last 72 hours. Anemia Panel: No results for input(s): "VITAMINB12", "FOLATE", "FERRITIN", "TIBC", "IRON", "RETICCTPCT" in the last 72 hours. Urine analysis:    Component Value Date/Time   COLORURINE YELLOW 07/29/2022 1110   APPEARANCEUR HAZY (A) 07/29/2022 1110   LABSPEC 1.013 07/29/2022 1110   PHURINE 6.0 07/29/2022 1110   GLUCOSEU NEGATIVE 07/29/2022 1110   GLUCOSEU NEG mg/dL 40/98/1191 4782   HGBUR MODERATE (A) 07/29/2022 1110   HGBUR moderate 03/31/2010 0935   BILIRUBINUR NEGATIVE 07/29/2022 1110   BILIRUBINUR negative 03/27/2022 1404   BILIRUBINUR NEG 07/19/2015 0916   KETONESUR NEGATIVE 07/29/2022 1110   PROTEINUR 30 (A) 07/29/2022 1110    UROBILINOGEN 0.2 03/27/2022 1404   UROBILINOGEN 0.2 05/26/2010 1856   NITRITE NEGATIVE 07/29/2022 1110   LEUKOCYTESUR MODERATE (A) 07/29/2022 1110   Sepsis Labs: Invalid input(s): "PROCALCITONIN", "LACTICIDVEN"  Microbiology: Recent Results (from the past 240 hour(s))  Culture, blood (Routine X 2) w Reflex to ID Panel     Status: None   Collection Time: 07/29/22 12:04 PM   Specimen: BLOOD RIGHT HAND  Result Value Ref Range Status   Specimen Description BLOOD RIGHT HAND  Final   Special Requests   Final    BOTTLES DRAWN AEROBIC AND ANAEROBIC Blood Culture adequate volume   Culture   Final    NO GROWTH 5 DAYS Performed at [facility], [address], [place], [place] [address]    Report Status 08/03/2022 FINAL  Final  Culture, blood (Routine X 2) w Reflex to ID Panel     Status: None   Collection Time: 07/29/22 12:04 PM   Specimen: BLOOD RIGHT ARM  Result Value Ref Range Status   Specimen Description BLOOD RIGHT ARM  Final   Special Requests   Final    BOTTLES DRAWN AEROBIC AND ANAEROBIC Blood Culture results may not be optimal due to an inadequate volume of blood received in culture bottles   Culture   Final    NO GROWTH 5 DAYS Performed at [facility], [address] [place], [place] [address]    Report Status 08/03/2022 FINAL  Final  Urine Culture (for pregnant, neutropenic or urologic patients or patients with an indwelling urinary catheter)     Status: Abnormal   Collection Time: 07/29/22  6:08 PM   Specimen: Urine, Clean Catch  Result Value Ref Range Status   Specimen Description URINE, CLEAN CATCH  Final   Special Requests   Final    NONE Performed at [facility], [address], [place], [place] [address]    Culture (A)  Final    >=100,000 COLONIES/mL ESCHERICHIA COLI 20,000 COLONIES/mL KLEBSIELLA PNEUMONIAE    Report Status 08/02/2022 FINAL  Final   Organism ID, Bacteria ESCHERICHIA COLI (A)  Final   Organism ID, Bacteria KLEBSIELLA  PNEUMONIAE (A)  Final      Susceptibility   Escherichia coli - MIC*    AMPICILLIN >=32 RESISTANT Resistant     CEFAZOLIN <=4 SENSITIVE Sensitive     CEFEPIME <=0.12 SENSITIVE Sensitive     CEFTRIAXONE <=  0.25 SENSITIVE Sensitive     CIPROFLOXACIN >=4 RESISTANT Resistant     GENTAMICIN 4 SENSITIVE Sensitive     IMIPENEM <=0.25 SENSITIVE Sensitive     NITROFURANTOIN <=16 SENSITIVE Sensitive     TRIMETH/SULFA <=20 SENSITIVE Sensitive     AMPICILLIN/SULBACTAM >=32 RESISTANT Resistant     PIP/TAZO <=4 SENSITIVE Sensitive     * >=100,000 COLONIES/mL ESCHERICHIA COLI   Klebsiella pneumoniae - MIC*    AMPICILLIN >=32 RESISTANT Resistant     CEFAZOLIN <=4 SENSITIVE Sensitive     CEFEPIME <=0.12 SENSITIVE Sensitive     CEFTRIAXONE <=0.25 SENSITIVE Sensitive     CIPROFLOXACIN <=0.25 SENSITIVE Sensitive     GENTAMICIN <=1 SENSITIVE Sensitive     IMIPENEM <=0.25 SENSITIVE Sensitive     NITROFURANTOIN 64 INTERMEDIATE Intermediate     TRIMETH/SULFA <=20 SENSITIVE Sensitive     AMPICILLIN/SULBACTAM 4 SENSITIVE Sensitive     PIP/TAZO <=4 SENSITIVE Sensitive     * 20,000 COLONIES/mL KLEBSIELLA PNEUMONIAE  Gastrointestinal Panel by PCR , Stool     Status: None   Collection Time: 07/30/22  8:34 AM   Specimen: Stool  Result Value Ref Range Status   Campylobacter species NOT DETECTED NOT DETECTED Final   Plesimonas shigelloides NOT DETECTED NOT DETECTED Final   Salmonella species NOT DETECTED NOT DETECTED Final   Yersinia enterocolitica NOT DETECTED NOT DETECTED Final   Vibrio species NOT DETECTED NOT DETECTED Final   Vibrio cholerae NOT DETECTED NOT DETECTED Final   Enteroaggregative E coli (EAEC) NOT DETECTED NOT DETECTED Final   Enteropathogenic E coli (EPEC) NOT DETECTED NOT DETECTED Final   Enterotoxigenic E coli (ETEC) NOT DETECTED NOT DETECTED Final   Shiga like toxin producing E coli (STEC) NOT DETECTED NOT DETECTED Final   Shigella/Enteroinvasive E coli (EIEC) NOT DETECTED NOT DETECTED  Final   Cryptosporidium NOT DETECTED NOT DETECTED Final   Cyclospora cayetanensis NOT DETECTED NOT DETECTED Final   Entamoeba histolytica NOT DETECTED NOT DETECTED Final   Giardia lamblia NOT DETECTED NOT DETECTED Final   Adenovirus F40/41 NOT DETECTED NOT DETECTED Final   Astrovirus NOT DETECTED NOT DETECTED Final   Norovirus GI/GII NOT DETECTED NOT DETECTED Final   Rotavirus A NOT DETECTED NOT DETECTED Final   Sapovirus (I, II, IV, and V) NOT DETECTED NOT DETECTED Final    Comment: Performed at [facility], [address], [place], [place] [address]    Radiology Studies: No results found.    Sobia Karger T. Mickael Mcnutt [facility]  If 7PM-7AM, please contact night-coverage [email or web address] 08/07/2022, 11:51 AM

## 2022-08-07 NOTE — Progress Notes (Signed)
Calorie Count Note - Day 3 Results   Envelope left to collect tickets on pt's door to determine if any progress is being made towards meeting nutrition goals orally. Pt continues to meet <50% of her needs orally. However, discussed with SLP and pt is will and eager to eat, but takes a very long time with meals and requires full assist. Depending on the next venue of care, this may be a very difficult to achieve. SLP reports that pt doing well with liquids. Added additional liquid supplements for pt.  Diet: Dysphagia 2, thin liquids  Supplements: Ensure Enlive - BID  Estimated Nutritional Needs:  Kcal:  1750-1950 Protein:  90-110 grams Fluid:  1.7-1.9 L  5/23 Dinner: no documentation 5/24 Breakfast: No ticket, 60% documented in flowsheet (likely ~350kcal and 14g protein based on estimation) 5/24 Lunch: 490kcal, 25g of protein  Total intake: 840 kcal (48% of minimum estimated needs)  39 protein (43% of minimum estimated needs)  Nutrition Diagnosis: Inadequate oral intake related to lethargy/confusion, inability to eat as evidenced by NPO status.   Goal: Patient will meet greater than or equal to 90% of their needs   Intervention:  Continue PO diet per SLP Add Mighty shakes TID to provide 330kcal and 9g protein Ensure Enlive po BID, each supplement provides 350 kcal and 20 grams of protein. Nocturnal feeds of Glucerna 1.5 at 70 ml/hr x 12 hours (840 ml/day) - free water flush of q4h - Tube feeding regimen at goal rate provides 1260 kcal, 69 grams of protein, and 638 ml of H2O.(1864mL = TF+flush)   Greig Castilla, RD, LDN Clinical Dietitian RD pager # available in AMION  After hours/weekend pager # available in [facility]

## 2022-08-07 NOTE — TOC Progression Note (Signed)
Transition of Care [facility]) - Progression Note    Patient Details  Name: Kingston Mccrite Souder MRN: [id number] Date of Birth: 27-Nov-1960  Transition of Care [facility]) CM/SW Contact  Baldemar Len[place], [place] Phone Number: 08/07/2022, 10:52 AM  Clinical Narrative:   Patient has received insurance approval to admit to [facility] when stable. CSW to follow.    Expected Discharge Plan: Skilled Nursing Facility Barriers to Discharge: Continued Medical Work up  Expected Discharge Plan and Services   Discharge Planning Services: CM Consult Post Acute Care Choice: IP Rehab Living arrangements for the past 2 months: Single Family Home                                       Social Determinants of Health (SDOH) Interventions SDOH Screenings   Food Insecurity: Patient Unable To Answer (07/25/2022)  Housing: Low Risk  (07/25/2022)  Transportation Needs: Patient Unable To Answer (07/25/2022)  Utilities: Patient Unable To Answer (07/25/2022)  Depression (PHQ2-9): Low Risk  (03/27/2022)  Tobacco Use: Low Risk  (07/24/2022)    Readmission Risk Interventions     No data to display

## 2022-08-07 NOTE — Progress Notes (Signed)
Speech Language Pathology Treatment: Dysphagia;Cognitive-Linquistic (Aphasia)  Patient Details Name: Anna Berry MRN: [id number] DOB: 16-Jan-1961 Today's Date: 08/07/2022 Time: 4098-1191 SLP Time Calculation (min) (ACUTE ONLY): 47 min  Assessment / Plan / Recommendation Clinical Impression  Pt was seen during lunch for treatment. She was alert and cooperative during the session. Pt followed 1-step commands with 50% accuracy and produced "I don't know" and "sweet" in response to two of the SLP's questions; however, additional language was not be elicited during structured tasks or conversation during the meal. Pt consumed a meal of mashed potatoes, finely chopped crispy baked tilapia, finely chopped broccoli, a magic cup, pureed berries, and thin liquids via straw. Pt tolerated all solids and liquids without overt s/s of aspiration. Labial stripping was reduced and mild right-sided anterior spillage was inconsistently noted. Timing of oral clearance was similar with dysphagia 2 and puree boluses with some oral holding being noted with purees and mildly prolonged mastication with dysphagia 2 boluses. Despite the length of time that it took her to eat, pt did not refuse any p.o. intake and consistently readily accepted solids and liquids. Case discussed with RD regarding the potential for incorporating liquid supplements into the pt's diet since her timeliness of swallowing is WNL for liquids, but impaired with solids (e.g., magic cup); RD advised that Might Shakes will be added to the pt's diet. [name] current diet of dysphagia 2 and thin liquids will be continued and SLP will continue to follow pt.     HPI HPI: Pt is a 62 y.o. female who presented with difficulty speaking. MRI brain 5/11: Large area of hemorrhage (approximately 18 mL) in the left frontal lobe, within the anterior cerebral artery territory. Pt passed [facility] 5/11, but then had neuro changes and could not complete a repeat [facility] due to  lethargy. PMH: sickle cell trait, sigmoid diverticulosis, asthma, OA; BSE completed on 07/26/22 with NPO recommendation. ST f/u for SLE and po readiness.      SLP Plan  Continue with current plan of care      Recommendations for follow up therapy are one component of a multi-disciplinary discharge planning process, led by the attending physician.  Recommendations may be updated based on patient status, additional functional criteria and insurance authorization.    Recommendations  Diet recommendations: Dysphagia 2 (fine chop);Thin liquid Liquids provided via: Cup;Straw Medication Administration: Crushed with puree Supervision: Staff to assist with self feeding Compensations: Slow rate;Small sips/bites;Lingual sweep for clearance of pocketing;Follow solids with liquid Postural Changes and/or Swallow Maneuvers: Seated upright 90 degrees                  Oral care QID;Staff/trained caregiver to provide oral care   Frequent or constant Supervision/Assistance Dysphagia, oral phase (R13.11)     Continue with current plan of care   Anna Berry I. Vear Clock, MS, CCC-SLP Neuro Diagnostic Specialist  Acute Rehabilitation Services Office number: [phone]  Scheryl Marten  08/07/2022, 2:48 PM

## 2022-08-07 NOTE — Progress Notes (Addendum)
RN reached out to lab regarding 5am labs and was told they would be on the floor at 5am. Labs are still pending.   Pt had uneventful night. NAD. Pt did not attempt to pull on any lines or NG tube.

## 2022-08-08 DIAGNOSIS — B9620 Unspecified Escherichia coli [E. coli] as the cause of diseases classified elsewhere: Secondary | ICD-10-CM | POA: Diagnosis not present

## 2022-08-08 DIAGNOSIS — R1310 Dysphagia, unspecified: Secondary | ICD-10-CM | POA: Diagnosis not present

## 2022-08-08 DIAGNOSIS — N390 Urinary tract infection, site not specified: Secondary | ICD-10-CM | POA: Diagnosis not present

## 2022-08-08 DIAGNOSIS — I611 Nontraumatic intracerebral hemorrhage in hemisphere, cortical: Secondary | ICD-10-CM | POA: Diagnosis not present

## 2022-08-08 LAB — CBC
HCT: 40 % (ref 36.0–46.0)
Hemoglobin: 12.9 g/dL (ref 12.0–15.0)
MCH: 27.9 pg (ref 26.0–34.0)
MCHC: 32.3 g/dL (ref 30.0–36.0)
MCV: 86.4 fL (ref 80.0–100.0)
Platelets: 295 10*3/uL (ref 150–400)
RBC: 4.63 MIL/uL (ref 3.87–5.11)
RDW: 15.3 % (ref 11.5–15.5)
WBC: 9.2 10*3/uL (ref 4.0–10.5)
nRBC: 0 % (ref 0.0–0.2)

## 2022-08-08 LAB — GLUCOSE, CAPILLARY
Glucose-Capillary: 112 mg/dL — ABNORMAL HIGH (ref 70–99)
Glucose-Capillary: 122 mg/dL — ABNORMAL HIGH (ref 70–99)
Glucose-Capillary: 127 mg/dL — ABNORMAL HIGH (ref 70–99)
Glucose-Capillary: 138 mg/dL — ABNORMAL HIGH (ref 70–99)
Glucose-Capillary: 141 mg/dL — ABNORMAL HIGH (ref 70–99)
Glucose-Capillary: 152 mg/dL — ABNORMAL HIGH (ref 70–99)
Glucose-Capillary: 95 mg/dL (ref 70–99)

## 2022-08-08 LAB — COMPREHENSIVE METABOLIC PANEL
ALT: 200 U/L — ABNORMAL HIGH (ref 0–44)
AST: 85 U/L — ABNORMAL HIGH (ref 15–41)
Albumin: 3.3 g/dL — ABNORMAL LOW (ref 3.5–5.0)
Alkaline Phosphatase: 99 U/L (ref 38–126)
Anion gap: 11 (ref 5–15)
BUN: 25 mg/dL — ABNORMAL HIGH (ref 8–23)
CO2: 26 mmol/L (ref 22–32)
Calcium: 9.4 mg/dL (ref 8.9–10.3)
Chloride: 105 mmol/L (ref 98–111)
Creatinine, Ser: 1.13 mg/dL — ABNORMAL HIGH (ref 0.44–1.00)
GFR, Estimated: 55 mL/min — ABNORMAL LOW (ref >60)
Glucose, Bld: 144 mg/dL — ABNORMAL HIGH (ref 70–99)
Potassium: 4.2 mmol/L (ref 3.5–5.1)
Sodium: 142 mmol/L (ref 135–145)
Total Bilirubin: 0.5 mg/dL (ref 0.3–1.2)
Total Protein: 6.9 g/dL (ref 6.5–8.1)

## 2022-08-08 LAB — MAGNESIUM: Magnesium: 2.5 mg/dL — ABNORMAL HIGH (ref 1.7–2.4)

## 2022-08-08 LAB — PHOSPHORUS: Phosphorus: 4.5 mg/dL (ref 2.5–4.6)

## 2022-08-08 MED ORDER — LACTATED RINGERS IV BOLUS
1000.0000 mL | Freq: Once | INTRAVENOUS | Status: AC
  Administered 2022-08-08: 1000 mL via INTRAVENOUS

## 2022-08-08 NOTE — Plan of Care (Signed)
  Problem: Education: Goal: Knowledge of disease or condition will improve Outcome: Progressing Goal: Knowledge of secondary prevention will improve (MUST DOCUMENT ALL) Outcome: Progressing Goal: Knowledge of patient specific risk factors will improve Loraine Leriche N/A or DELETE if not current risk factor) Outcome: Progressing   Problem: Intracerebral Hemorrhage Tissue Perfusion: Goal: Complications of Intracerebral Hemorrhage will be minimized Outcome: Progressing   Problem: Coping: Goal: Will verbalize positive feelings about self Outcome: Progressing Goal: Will identify appropriate support needs Outcome: Progressing   Problem: Health Behavior/Discharge Planning: Goal: Ability to manage health-related needs will improve Outcome: Progressing Goal: Goals will be collaboratively established with patient/family Outcome: Progressing   Problem: Self-Care: Goal: Ability to participate in self-care as condition permits will improve Outcome: Progressing Goal: Verbalization of feelings and concerns over difficulty with self-care will improve Outcome: Progressing Goal: Ability to communicate needs accurately will improve Outcome: Progressing   Problem: Nutrition: Goal: Risk of aspiration will decrease Outcome: Progressing Goal: Dietary intake will improve Outcome: Progressing   Problem: Education: Goal: Knowledge of General Education information will improve Description: Including pain rating scale, medication(s)/side effects and non-pharmacologic comfort measures Outcome: Progressing   Problem: Health Behavior/Discharge Planning: Goal: Ability to manage health-related needs will improve Outcome: Progressing   Problem: Clinical Measurements: Goal: Ability to maintain clinical measurements within normal limits will improve Outcome: Progressing Goal: Will remain free from infection Outcome: Progressing Goal: Diagnostic test results will improve Outcome: Progressing Goal:  Cardiovascular complication will be avoided Outcome: Progressing   Problem: Activity: Goal: Risk for activity intolerance will decrease Outcome: Progressing   Problem: Nutrition: Goal: Adequate nutrition will be maintained Outcome: Progressing   Problem: Coping: Goal: Level of anxiety will decrease Outcome: Progressing   Problem: Elimination: Goal: Will not experience complications related to bowel motility Outcome: Progressing Goal: Will not experience complications related to urinary retention Outcome: Progressing   Problem: Pain Managment: Goal: General experience of comfort will improve Outcome: Progressing   Problem: Safety: Goal: Ability to remain free from injury will improve Outcome: Progressing   Problem: Skin Integrity: Goal: Risk for impaired skin integrity will decrease Outcome: Progressing   Problem: Safety: Goal: Non-violent Restraint(s) Outcome: Progressing   Problem: Education: Goal: Ability to describe self-care measures that may prevent or decrease complications (Diabetes Survival Skills Education) will improve Outcome: Progressing Goal: Individualized Educational Video(s) Outcome: Progressing   Problem: Coping: Goal: Ability to adjust to condition or change in health will improve Outcome: Progressing   Problem: Fluid Volume: Goal: Ability to maintain a balanced intake and output will improve Outcome: Progressing   Problem: Health Behavior/Discharge Planning: Goal: Ability to identify and utilize available resources and services will improve Outcome: Progressing Goal: Ability to manage health-related needs will improve Outcome: Progressing   Problem: Metabolic: Goal: Ability to maintain appropriate glucose levels will improve Outcome: Progressing   Problem: Nutritional: Goal: Maintenance of adequate nutrition will improve Outcome: Progressing Goal: Progress toward achieving an optimal weight will improve Outcome: Progressing    Problem: Skin Integrity: Goal: Risk for impaired skin integrity will decrease Outcome: Progressing   Problem: Tissue Perfusion: Goal: Adequacy of tissue perfusion will improve Outcome: Progressing

## 2022-08-08 NOTE — Progress Notes (Signed)
PROGRESS NOTE  Anna Berry ZOX:[id number] DOB: 1960-05-26   PCP: Maury Dus, MD  Patient is from:   DOA: 07/24/2022 LOS: 14  Chief complaints Chief Complaint  Patient presents with   Altered Mental Status     Brief Narrative / Interim history: 62 year old F with PMH of asthma, diverticulosis, sickle cell trait and osteoarthritis brought to ED by EMS as code stroke due to inability to speak, and found to have left frontal ICH with left ACA distribution with concern for hemorrhagic transformation.  Done, she had new frontal ICH lateral to the previous hematoma.  Eventually, she was transferred out of ICU to hospitalist service on 5/15.   Hospital course complicated by fever and UTI for which she is on IV ceftriaxone for total of 7 days.  Patient remains confused and aphasic.  She has had Cortrak for tube feed.  She has been upgraded to dysphagia 2 diet on 5/22.  Tube feed decreased to nocturnal but p.o. intake remains poor but slowly improving..  Ongoing discussion about G-tube with family.  Subjective: Seen and examined earlier this morning.  No major events overnight of this morning.  Able to mumble some words for the first time.  Smiles through greeting.  Follows some commands.  Objective: Vitals:   08/08/22 0412 08/08/22 0500 08/08/22 0756 08/08/22 1110  BP: 103/70  117/77 113/69  Pulse: 93  98 97  Resp: 16  16 18   Temp: 98.3 F (36.8 C)  97.9 F (36.6 C) 98.5 F (36.9 C)  TempSrc: Oral  Oral Oral  SpO2: 94%  96% 98%  Weight:  87.3 kg    Height:        Examination:  GENERAL: No apparent distress.  Nontoxic. HEENT: MMM.  Vision and hearing grossly intact.  Cortrak in place. NECK: Supple.  No apparent JVD.  RESP:  No IWOB.  Fair aeration bilaterally. CVS:  RRR. Heart sounds normal.  ABD/GI/GU: BS+. Abd soft, NTND.  MSK/EXT:   Moves left arm.  Right hemiparesis.  Slightly moves right leg. SKIN: no apparent skin lesion or wound NEURO: Awake and alert.   Mumbles some words.  Follows some commands.  Right hemiparesis and right facial droop. PSYCH: Calm. Normal affect.   Procedures:  None  Microbiology summarized: 5/11-MRSA PCR screen nonreactive 5/16 GIP nonreactive 5/15-urine culture with E. coli and Klebsiella pneumonia 5/15-blood cultures NGTD  Assessment and plan: Principal Problem:   ICH (intracerebral hemorrhage) (HCC)  Intracranial hemorrhage: Initial imaging showed left frontal ICH with left ACA distribution with concern for hemorrhagic transformation.  Repeat CT head 2 days later with a stable left frontal hemorrhage and edema with mild subarachnoid extension, and midline shift about 5 mm.  CTV did not show venous thrombosis.  EEG negative for seizure.  TTE without significant finding.  LDL 105.  A1c 6.0%. -Continue PT/OT  Dysphagia: On cortrak TF and dysphagia 2 diet.  P.o. intake remains poor but slowly improving. -Have discussed G-tube with patient's husband who is going to discuss with his children before making decision -Monitor oral intake  Elevated creatinine.  He does not meet criteria for AKI.  At risk for dehydration. -IV LR bolus 1 L x 1 -Recheck renal function in the morning   Elevated blood pressure: Required Cleviprex in ICU.  Normotensive now. -Continue current regimen   Hyperlipidemia -Hold Crestor due to rising LFT   Hypernatremia: Resolved.  Fever/E. coli and Klebsiella pneumonia UTI: Last fever on 5/15. -Completed antibiotic course with IV ceftriaxone on  5/23.   Abnormal LFTS -Continue holding Crestor. -Recheck in the morning  Lactic Acidosis: Resolved.   GERD/GI prophylaxis -Continue PPI.   Obesity/inadequate oral intake Body mass index is 31.06 kg/m. Nutrition Problem: Inadequate oral intake Etiology: lethargy/confusion, inability to eat Signs/Symptoms: NPO status Interventions: Tube feeding, Refer to RD note for recommendations   DVT prophylaxis:  heparin injection 5,000 Units  Start: 07/27/22 1300 SCD's Start: 07/25/22 0359  Code Status: Full code Family Communication: Updated patient's husband over the phone on 5/25.  None at bedside today. Level of care: Telemetry Medical Status is: Inpatient Remains inpatient appropriate because: Intracranial hemorrhage and dysphagia   Final disposition: SNF Consultants:  Neurology  35 minutes with more than 50% spent in reviewing records, counseling patient/family and coordinating care.   Sch Meds:  Scheduled Meds:  Chlorhexidine Gluconate Cloth  6 each Topical Q0600   sennosides  5 mL Per Tube BID   And   docusate  50 mg Per Tube BID   feeding supplement  237 mL Oral BID BM   feeding supplement (GLUCERNA 1.5 CAL)  1,000 mL Per Tube Q24H   free water  200 mL Per Tube Q4H   heparin injection (subcutaneous)  5,000 Units Subcutaneous Q8H   insulin aspart  0-9 Units Subcutaneous Q4H   insulin aspart  2 Units Subcutaneous Q4H   mouth rinse  15 mL Mouth Rinse 4 times per day   pantoprazole (PROTONIX) IV  40 mg Intravenous QHS   polyethylene glycol  17 g Per Tube BID   sodium chloride flush  10-40 mL Intracatheter Q12H   Continuous Infusions:   PRN Meds:.albuterol, labetalol, mouth rinse, sodium chloride flush, white petrolatum  Antimicrobials: Anti-infectives (From admission, onward)    Start     Dose/Rate Route Frequency Ordered Stop   07/31/22 1600  cefTRIAXone (ROCEPHIN) 1 g in sodium chloride 0.9 % 100 mL IVPB        1 g 200 mL/hr over 30 Minutes Intravenous Every 24 hours 07/31/22 1503 08/06/22 1737        I have personally reviewed the following labs and images: CBC: Recent Labs  Lab 08/02/22 0440 08/03/22 0530 08/04/22 0505 08/05/22 0500 08/07/22 1400 08/08/22 0344  WBC 9.5 10.1 11.3* 11.9* 9.0 9.2  NEUTROABS 5.9 6.2 7.1 7.5 5.6  --   HGB 12.8 12.6 12.5 12.6 13.4 12.9  HCT 40.6 39.4 38.3 38.5 40.8 40.0  MCV 89.2 87.8 85.1 86.5 85.7 86.4  PLT 245 242 231 269 308 295   BMP &GFR Recent  Labs  Lab 08/04/22 0505 08/05/22 0500 08/06/22 0500 08/07/22 1400 08/08/22 0344  NA 140 138 142 143 142  K 4.2 4.0 4.2 4.2 4.2  CL 105 102 105 102 105  CO2 28 27 28 29 26   GLUCOSE 231* 184* 138* 111* 144*  BUN 19 20 22 22  25*  CREATININE 0.98 1.06* 1.06* 1.00 1.13*  CALCIUM 9.0 9.0 9.3 9.9 9.4  MG 2.2 2.3 2.5* 2.6* 2.5*  PHOS 3.3 3.7 4.5 4.3 4.5   Estimated Creatinine Clearance: 56.7 mL/min (A) (by C-G formula based on SCr of 1.13 mg/dL (H)). Liver & Pancreas: Recent Labs  Lab 08/04/22 0505 08/05/22 0500 08/06/22 0500 08/07/22 1400 08/08/22 0344  AST 62* 91* 110* 94* 85*  ALT 111* 156* 194* 221* 200*  ALKPHOS 96 103 108 110 99  BILITOT 0.3 0.3 0.5 0.3 0.5  PROT 6.7 6.5 6.9 7.1 6.9  ALBUMIN 2.9* 3.1* 3.2* 3.3* 3.3*   No results  for input(s): "LIPASE", "AMYLASE" in the last 168 hours. Recent Labs  Lab 08/06/22 0500  AMMONIA 32   Diabetic: No results for input(s): "HGBA1C" in the last 72 hours. Recent Labs  Lab 08/07/22 2037 08/07/22 2346 08/08/22 0412 08/08/22 0810 08/08/22 1110  GLUCAP 139* 142* 141* 138* 152*   Cardiac Enzymes: Recent Labs  Lab 08/06/22 0500  CKTOTAL 102   No results for input(s): "PROBNP" in the last 8760 hours. Coagulation Profile: No results for input(s): "INR", "PROTIME" in the last 168 hours. Thyroid Function Tests: No results for input(s): "TSH", "T4TOTAL", "FREET4", "T3FREE", "THYROIDAB" in the last 72 hours. Lipid Profile: No results for input(s): "CHOL", "HDL", "LDLCALC", "TRIG", "CHOLHDL", "LDLDIRECT" in the last 72 hours. Anemia Panel: No results for input(s): "VITAMINB12", "FOLATE", "FERRITIN", "TIBC", "IRON", "RETICCTPCT" in the last 72 hours. Urine analysis:    Component Value Date/Time   COLORURINE YELLOW 07/29/2022 1110   APPEARANCEUR HAZY (A) 07/29/2022 1110   LABSPEC 1.013 07/29/2022 1110   PHURINE 6.0 07/29/2022 1110   GLUCOSEU NEGATIVE 07/29/2022 1110   GLUCOSEU NEG mg/dL 91/47/8295 6213   HGBUR MODERATE  (A) 07/29/2022 1110   HGBUR moderate 03/31/2010 0935   BILIRUBINUR NEGATIVE 07/29/2022 1110   BILIRUBINUR negative 03/27/2022 1404   BILIRUBINUR NEG 07/19/2015 0916   KETONESUR NEGATIVE 07/29/2022 1110   PROTEINUR 30 (A) 07/29/2022 1110   UROBILINOGEN 0.2 03/27/2022 1404   UROBILINOGEN 0.2 05/26/2010 1856   NITRITE NEGATIVE 07/29/2022 1110   LEUKOCYTESUR MODERATE (A) 07/29/2022 1110   Sepsis Labs: Invalid input(s): "PROCALCITONIN", "LACTICIDVEN"  Microbiology: Recent Results (from the past 240 hour(s))  Urine Culture (for pregnant, neutropenic or urologic patients or patients with an indwelling urinary catheter)     Status: Abnormal   Collection Time: 07/29/22  6:08 PM   Specimen: Urine, Clean Catch  Result Value Ref Range Status   Specimen Description URINE, CLEAN CATCH  Final   Special Requests   Final    NONE Performed at [facility], [address], [place], [place] [address]    Culture (A)  Final    >=100,000 COLONIES/mL ESCHERICHIA COLI 20,000 COLONIES/mL KLEBSIELLA PNEUMONIAE    Report Status 08/02/2022 FINAL  Final   Organism ID, Bacteria ESCHERICHIA COLI (A)  Final   Organism ID, Bacteria KLEBSIELLA PNEUMONIAE (A)  Final      Susceptibility   Escherichia coli - MIC*    AMPICILLIN >=32 RESISTANT Resistant     CEFAZOLIN <=4 SENSITIVE Sensitive     CEFEPIME <=0.12 SENSITIVE Sensitive     CEFTRIAXONE <=0.25 SENSITIVE Sensitive     CIPROFLOXACIN >=4 RESISTANT Resistant     GENTAMICIN 4 SENSITIVE Sensitive     IMIPENEM <=0.25 SENSITIVE Sensitive     NITROFURANTOIN <=16 SENSITIVE Sensitive     TRIMETH/SULFA <=20 SENSITIVE Sensitive     AMPICILLIN/SULBACTAM >=32 RESISTANT Resistant     PIP/TAZO <=4 SENSITIVE Sensitive     * >=100,000 COLONIES/mL ESCHERICHIA COLI   Klebsiella pneumoniae - MIC*    AMPICILLIN >=32 RESISTANT Resistant     CEFAZOLIN <=4 SENSITIVE Sensitive     CEFEPIME <=0.12 SENSITIVE Sensitive     CEFTRIAXONE <=0.25 SENSITIVE Sensitive      CIPROFLOXACIN <=0.25 SENSITIVE Sensitive     GENTAMICIN <=1 SENSITIVE Sensitive     IMIPENEM <=0.25 SENSITIVE Sensitive     NITROFURANTOIN 64 INTERMEDIATE Intermediate     TRIMETH/SULFA <=20 SENSITIVE Sensitive     AMPICILLIN/SULBACTAM 4 SENSITIVE Sensitive     PIP/TAZO <=4 SENSITIVE Sensitive     *  20,000 COLONIES/mL KLEBSIELLA PNEUMONIAE  Gastrointestinal Panel by PCR , Stool     Status: None   Collection Time: 07/30/22  8:34 AM   Specimen: Stool  Result Value Ref Range Status   Campylobacter species NOT DETECTED NOT DETECTED Final   Plesimonas shigelloides NOT DETECTED NOT DETECTED Final   Salmonella species NOT DETECTED NOT DETECTED Final   Yersinia enterocolitica NOT DETECTED NOT DETECTED Final   Vibrio species NOT DETECTED NOT DETECTED Final   Vibrio cholerae NOT DETECTED NOT DETECTED Final   Enteroaggregative E coli (EAEC) NOT DETECTED NOT DETECTED Final   Enteropathogenic E coli (EPEC) NOT DETECTED NOT DETECTED Final   Enterotoxigenic E coli (ETEC) NOT DETECTED NOT DETECTED Final   Shiga like toxin producing E coli (STEC) NOT DETECTED NOT DETECTED Final   Shigella/Enteroinvasive E coli (EIEC) NOT DETECTED NOT DETECTED Final   Cryptosporidium NOT DETECTED NOT DETECTED Final   Cyclospora cayetanensis NOT DETECTED NOT DETECTED Final   Entamoeba histolytica NOT DETECTED NOT DETECTED Final   Giardia lamblia NOT DETECTED NOT DETECTED Final   Adenovirus F40/41 NOT DETECTED NOT DETECTED Final   Astrovirus NOT DETECTED NOT DETECTED Final   Norovirus GI/GII NOT DETECTED NOT DETECTED Final   Rotavirus A NOT DETECTED NOT DETECTED Final   Sapovirus (I, II, IV, and V) NOT DETECTED NOT DETECTED Final    Comment: Performed at [facility], [address], [place], [place] [address]    Radiology Studies: No results found.    Winola Drum T. Tyisha Cressy [facility]  If 7PM-7AM, please contact night-coverage [email or web address] 08/08/2022, 12:38 PM

## 2022-08-09 ENCOUNTER — Inpatient Hospital Stay (HOSPITAL_COMMUNITY): Payer: BLUE CROSS/BLUE SHIELD

## 2022-08-09 DIAGNOSIS — B9620 Unspecified Escherichia coli [E. coli] as the cause of diseases classified elsewhere: Secondary | ICD-10-CM | POA: Diagnosis not present

## 2022-08-09 DIAGNOSIS — I611 Nontraumatic intracerebral hemorrhage in hemisphere, cortical: Secondary | ICD-10-CM | POA: Diagnosis not present

## 2022-08-09 DIAGNOSIS — R1310 Dysphagia, unspecified: Secondary | ICD-10-CM | POA: Diagnosis not present

## 2022-08-09 DIAGNOSIS — N390 Urinary tract infection, site not specified: Secondary | ICD-10-CM | POA: Diagnosis not present

## 2022-08-09 LAB — COMPREHENSIVE METABOLIC PANEL
ALT: 176 U/L — ABNORMAL HIGH (ref 0–44)
AST: 70 U/L — ABNORMAL HIGH (ref 15–41)
Albumin: 3.2 g/dL — ABNORMAL LOW (ref 3.5–5.0)
Alkaline Phosphatase: 94 U/L (ref 38–126)
Anion gap: 9 (ref 5–15)
BUN: 20 mg/dL (ref 8–23)
CO2: 28 mmol/L (ref 22–32)
Calcium: 9.1 mg/dL (ref 8.9–10.3)
Chloride: 102 mmol/L (ref 98–111)
Creatinine, Ser: 0.92 mg/dL (ref 0.44–1.00)
GFR, Estimated: >60 mL/min (ref >60)
Glucose, Bld: 135 mg/dL — ABNORMAL HIGH (ref 70–99)
Potassium: 4.2 mmol/L (ref 3.5–5.1)
Sodium: 139 mmol/L (ref 135–145)
Total Bilirubin: 0.4 mg/dL (ref 0.3–1.2)
Total Protein: 6.7 g/dL (ref 6.5–8.1)

## 2022-08-09 LAB — GLUCOSE, CAPILLARY
Glucose-Capillary: 115 mg/dL — ABNORMAL HIGH (ref 70–99)
Glucose-Capillary: 134 mg/dL — ABNORMAL HIGH (ref 70–99)
Glucose-Capillary: 117 mg/dL — ABNORMAL HIGH (ref 70–99)
Glucose-Capillary: 115 mg/dL — ABNORMAL HIGH (ref 70–99)
Glucose-Capillary: 134 mg/dL — ABNORMAL HIGH (ref 70–99)

## 2022-08-09 LAB — MAGNESIUM: Magnesium: 2 mg/dL (ref 1.7–2.4)

## 2022-08-09 LAB — PHOSPHORUS: Phosphorus: 4.2 mg/dL (ref 2.5–4.6)

## 2022-08-09 NOTE — Progress Notes (Signed)
PROGRESS NOTE  Anna Berry ZOX:[id number] DOB: Oct 10, 1960   PCP: Maury Dus, MD  Patient is from:   DOA: 07/24/2022 LOS: 15  Chief complaints Chief Complaint  Patient presents with   Altered Mental Status     Brief Narrative / Interim history: 62 year old F with PMH of asthma, diverticulosis, sickle cell trait and osteoarthritis brought to ED by EMS as code stroke due to inability to speak, and found to have left frontal ICH with left ACA distribution with concern for hemorrhagic transformation.  Done, she had new frontal ICH lateral to the previous hematoma.  Eventually, she was transferred out of ICU to hospitalist service on 5/15.   Hospital course complicated by fever and UTI for which she is on IV ceftriaxone for total of 7 days.  Patient remains confused and aphasic.  She has had Cortrak for tube feed.  She has been upgraded to dysphagia 2 diet on 5/22.  Tube feed decreased to nocturnal but p.o. intake remains poor.  IR consulted for G-tube placement after discussion with patient's husband.  Subjective: Seen and examined earlier this morning.  No major events overnight of this morning.  Able to mumble some words and follow command.  Does not seem to be in distress.  Objective: Vitals:   08/09/22 0010 08/09/22 0340 08/09/22 0409 08/09/22 0906  BP: 125/78 133/83  132/83  Pulse: 93 93  99  Resp: 18 17  20   Temp: 98.7 F (37.1 C) 98.8 F (37.1 C)  98.1 F (36.7 C)  TempSrc: Oral Oral  Oral  SpO2: 96%   97%  Weight:   87 kg   Height:        Examination:  GENERAL: No apparent distress.  Nontoxic. HEENT: MMM.  Vision and hearing grossly intact.  Cortrak in place. NECK: Supple.  No apparent JVD.  RESP:  No IWOB.  Fair aeration bilaterally. CVS:  RRR. Heart sounds normal.  ABD/GI/GU: BS+. Abd soft, NTND.  MSK/EXT:   Moves left arm.  Right hemiparesis.  Slightly moves right leg. SKIN: no apparent skin lesion or wound NEURO: Awake and alert.  Mumbles some  words.  Follows some commands.  Right hemiparesis and right facial droop. PSYCH: Calm. Normal affect.   Procedures:  None  Microbiology summarized: 5/11-MRSA PCR screen nonreactive 5/16 GIP nonreactive 5/15-urine culture with E. coli and Klebsiella pneumonia 5/15-blood cultures NGTD  Assessment and plan: Principal Problem:   ICH (intracerebral hemorrhage) (HCC)  Intracranial hemorrhage: Initial imaging showed left frontal ICH with left ACA distribution with concern for hemorrhagic transformation.  Repeat CT head 2 days later with a stable left frontal hemorrhage and edema with mild subarachnoid extension, and midline shift about 5 mm.  CTV did not show venous thrombosis.  EEG negative for seizure.  TTE without significant finding.  LDL 105.  A1c 6.0%. -Continue PT/OT  Dysphagia: On cortrak TF and dysphagia 2 diet.  P.o. intake remains poor.  Discussed G-tube with patient's husband. -IR consult for G-tube placement  Elevated creatinine.  He does not meet criteria for AKI.  At risk for dehydration.  Improved after IV LR bolus.   Elevated blood pressure: Required Cleviprex in ICU.  Normotensive now. -Continue current regimen   Hyperlipidemia -Hold Crestor due to rising LFT   Hypernatremia: Resolved.  Fever/E. coli and Klebsiella pneumonia UTI: Last fever on 5/15. -Completed antibiotic course with IV ceftriaxone on 5/23.   Abnormal LFTS: Improved. -Continue holding Crestor. -Check intermittently.  Lactic Acidosis: Resolved.   GERD/GI prophylaxis -  Continue PPI.   Obesity/inadequate oral intake Body mass index is 30.96 kg/m. Nutrition Problem: Inadequate oral intake Etiology: lethargy/confusion, inability to eat Signs/Symptoms: NPO status Interventions: Tube feeding, Refer to RD note for recommendations   DVT prophylaxis:  heparin injection 5,000 Units Start: 07/27/22 1300 SCD's Start: 07/25/22 0359  Code Status: Full code Family Communication: None at bedside.   Attempted to call patient's husband but no answer. Level of care: Med-Surg Status is: Inpatient Remains inpatient appropriate because: Intracranial hemorrhage and dysphagia   Final disposition: SNF Consultants:  Neurology  35 minutes with more than 50% spent in reviewing records, counseling patient/family and coordinating care.   Sch Meds:  Scheduled Meds:  Chlorhexidine Gluconate Cloth  6 each Topical Q0600   sennosides  5 mL Per Tube BID   And   docusate  50 mg Per Tube BID   feeding supplement  237 mL Oral BID BM   feeding supplement (GLUCERNA 1.5 CAL)  1,000 mL Per Tube Q24H   free water  200 mL Per Tube Q4H   heparin injection (subcutaneous)  5,000 Units Subcutaneous Q8H   insulin aspart  0-9 Units Subcutaneous Q4H   insulin aspart  2 Units Subcutaneous Q4H   mouth rinse  15 mL Mouth Rinse 4 times per day   pantoprazole (PROTONIX) IV  40 mg Intravenous QHS   polyethylene glycol  17 g Per Tube BID   sodium chloride flush  10-40 mL Intracatheter Q12H   Continuous Infusions:   PRN Meds:.albuterol, labetalol, mouth rinse, sodium chloride flush, white petrolatum  Antimicrobials: Anti-infectives (From admission, onward)    Start     Dose/Rate Route Frequency Ordered Stop   07/31/22 1600  cefTRIAXone (ROCEPHIN) 1 g in sodium chloride 0.9 % 100 mL IVPB        1 g 200 mL/hr over 30 Minutes Intravenous Every 24 hours 07/31/22 1503 08/06/22 1737        I have personally reviewed the following labs and images: CBC: Recent Labs  Lab 08/03/22 0530 08/04/22 0505 08/05/22 0500 08/07/22 1400 08/08/22 0344  WBC 10.1 11.3* 11.9* 9.0 9.2  NEUTROABS 6.2 7.1 7.5 5.6  --   HGB 12.6 12.5 12.6 13.4 12.9  HCT 39.4 38.3 38.5 40.8 40.0  MCV 87.8 85.1 86.5 85.7 86.4  PLT 242 231 269 308 295   BMP &GFR Recent Labs  Lab 08/05/22 0500 08/06/22 0500 08/07/22 1400 08/08/22 0344 08/09/22 0704  NA 138 142 143 142 139  K 4.0 4.2 4.2 4.2 4.2  CL 102 105 102 105 102  CO2 27 28  29 26 28   GLUCOSE 184* 138* 111* 144* 135*  BUN 20 22 22  25* 20  CREATININE 1.06* 1.06* 1.00 1.13* 0.92  CALCIUM 9.0 9.3 9.9 9.4 9.1  MG 2.3 2.5* 2.6* 2.5* 2.0  PHOS 3.7 4.5 4.3 4.5 4.2   Estimated Creatinine Clearance: 69.6 mL/min (by C-G formula based on SCr of 0.92 mg/dL). Liver & Pancreas: Recent Labs  Lab 08/05/22 0500 08/06/22 0500 08/07/22 1400 08/08/22 0344 08/09/22 0704  AST 91* 110* 94* 85* 70*  ALT 156* 194* 221* 200* 176*  ALKPHOS 103 108 110 99 94  BILITOT 0.3 0.5 0.3 0.5 0.4  PROT 6.5 6.9 7.1 6.9 6.7  ALBUMIN 3.1* 3.2* 3.3* 3.3* 3.2*   No results for input(s): "LIPASE", "AMYLASE" in the last 168 hours. Recent Labs  Lab 08/06/22 0500  AMMONIA 32   Diabetic: No results for input(s): "HGBA1C" in the last 72 hours. Recent  Labs  Lab 08/08/22 2030 08/08/22 2042 08/08/22 2331 08/09/22 0320 08/09/22 0909  GLUCAP 112* 127* 122* 115* 134*   Cardiac Enzymes: Recent Labs  Lab 08/06/22 0500  CKTOTAL 102   No results for input(s): "PROBNP" in the last 8760 hours. Coagulation Profile: No results for input(s): "INR", "PROTIME" in the last 168 hours. Thyroid Function Tests: No results for input(s): "TSH", "T4TOTAL", "FREET4", "T3FREE", "THYROIDAB" in the last 72 hours. Lipid Profile: No results for input(s): "CHOL", "HDL", "LDLCALC", "TRIG", "CHOLHDL", "LDLDIRECT" in the last 72 hours. Anemia Panel: No results for input(s): "VITAMINB12", "FOLATE", "FERRITIN", "TIBC", "IRON", "RETICCTPCT" in the last 72 hours. Urine analysis:    Component Value Date/Time   COLORURINE YELLOW 07/29/2022 1110   APPEARANCEUR HAZY (A) 07/29/2022 1110   LABSPEC 1.013 07/29/2022 1110   PHURINE 6.0 07/29/2022 1110   GLUCOSEU NEGATIVE 07/29/2022 1110   GLUCOSEU NEG mg/dL 47/82/9562 1308   HGBUR MODERATE (A) 07/29/2022 1110   HGBUR moderate 03/31/2010 0935   BILIRUBINUR NEGATIVE 07/29/2022 1110   BILIRUBINUR negative 03/27/2022 1404   BILIRUBINUR NEG 07/19/2015 0916   KETONESUR  NEGATIVE 07/29/2022 1110   PROTEINUR 30 (A) 07/29/2022 1110   UROBILINOGEN 0.2 03/27/2022 1404   UROBILINOGEN 0.2 05/26/2010 1856   NITRITE NEGATIVE 07/29/2022 1110   LEUKOCYTESUR MODERATE (A) 07/29/2022 1110   Sepsis Labs: Invalid input(s): "PROCALCITONIN", "LACTICIDVEN"  Microbiology: No results found for this or any previous visit (from the past 240 hour(s)).   Radiology Studies: No results found.    Lindalou Soltis T. Lydell Moga [facility]  If 7PM-7AM, please contact night-coverage [email or web address] 08/09/2022, 11:35 AM

## 2022-08-09 NOTE — Progress Notes (Signed)
No overnight issues. Tube feeding tolerated well. Safety maintained. Pt didn't attempt to pull at any lines or ng tube. Safety maintained.

## 2022-08-10 DIAGNOSIS — R1310 Dysphagia, unspecified: Secondary | ICD-10-CM | POA: Diagnosis not present

## 2022-08-10 DIAGNOSIS — B9620 Unspecified Escherichia coli [E. coli] as the cause of diseases classified elsewhere: Secondary | ICD-10-CM | POA: Diagnosis not present

## 2022-08-10 DIAGNOSIS — I611 Nontraumatic intracerebral hemorrhage in hemisphere, cortical: Secondary | ICD-10-CM | POA: Diagnosis not present

## 2022-08-10 DIAGNOSIS — N390 Urinary tract infection, site not specified: Secondary | ICD-10-CM | POA: Diagnosis not present

## 2022-08-10 LAB — GLUCOSE, CAPILLARY
Glucose-Capillary: 130 mg/dL — ABNORMAL HIGH (ref 70–99)
Glucose-Capillary: 140 mg/dL — ABNORMAL HIGH (ref 70–99)
Glucose-Capillary: 148 mg/dL — ABNORMAL HIGH (ref 70–99)
Glucose-Capillary: 164 mg/dL — ABNORMAL HIGH (ref 70–99)
Glucose-Capillary: 173 mg/dL — ABNORMAL HIGH (ref 70–99)

## 2022-08-10 MED ORDER — ORAL CARE MOUTH RINSE: 15.0000 mL | OROMUCOSAL | Status: DC | PRN

## 2022-08-10 MED ORDER — HEPARIN SODIUM (PORCINE) 5000 UNIT/ML IJ SOLN: 5000.0000 [IU] | Freq: Three times a day (TID) | INTRAMUSCULAR | Status: DC

## 2022-08-10 MED ORDER — ORAL CARE MOUTH RINSE
15.0000 mL | OROMUCOSAL | Status: DC
  Administered 2022-08-10 – 2022-08-18 (×31): 15 mL via OROMUCOSAL

## 2022-08-10 MED ORDER — DEXTROSE IN LACTATED RINGERS 5 % IV SOLN: INTRAVENOUS | Status: DC

## 2022-08-10 NOTE — Progress Notes (Signed)
Physical Therapy Treatment Patient Details Name: Anna Berry MRN: [id number] DOB: May 21, 1960 Today's Date: 08/10/2022   History of Present Illness Pt is a 62 y.o. female who presented 07/25/22 with speech difficulties. MRI showed a L frontal ICH. Worsening neuro decline noted 5/11 after admitting pt. Repeat imaging showed new area of ICH lateral to current ICH with MLS 5 mm. PMH: Sickle cell trait, sigmoid diverticulosis, asthma, OA, obesity, pre-diabetes    PT Comments    Pt awake upon PT entry, increased engagement in session today and increased following commands with LUE and LLE. Pt aggravated by NG tube, holding it in nose to avoid the fell of downward pull and tube causing increased sneezing, >5x in a row with copious nasal drainage. Pt came to EOB with mod A. Able to scoot L hip back in bed when cued. Initial posterior lean but after reaching activities with LUE and L hand to L knee pt able to maintain upright sitting 2 mins with min guard and no LOB. Max A +2 needed for sit>stand to stedy. Maintained R lean in stedy, worked on attempted wt shifting to L but pt avoidant. Pt in recliner end of session. PT will continue to follow.    Recommendations for follow up therapy are one component of a multi-disciplinary discharge planning process, led by the attending physician.  Recommendations may be updated based on patient status, additional functional criteria and insurance authorization.  Follow Up Recommendations  Can patient physically be transported by private vehicle: No    Assistance Recommended at Discharge Frequent or constant Supervision/Assistance  Patient can return home with the following Two people to help with walking and/or transfers;Two people to help with bathing/dressing/bathroom;Assistance with feeding;Assistance with cooking/housework;Direct supervision/assist for medications management;Direct supervision/assist for financial management;Assist for transportation;Help  with stairs or ramp for entrance   Equipment Recommendations  Hospital bed;Other (comment) (hoyer lift; tilt-in-space w/c)    Recommendations for Other Services       Precautions / Restrictions Precautions Precautions: Fall Precaution Comments: L wrist restraint, cortrak, SBP <169 Restrictions Weight Bearing Restrictions: No     Mobility  Bed Mobility Overal bed mobility: Needs Assistance Bed Mobility: Supine to Sit     Supine to sit: HOB elevated, Mod assist     General bed mobility comments: RLE moved to EOB and pt sat up into long sitting with trunk. Mod A to pivot to EOB. Pt able to scoot L hip back onto bed when cued to do so    Transfers Overall transfer level: Needs assistance Equipment used: Ambulation equipment used Transfers: Sit to/from Stand Sit to Stand: Max assist, From elevated surface, +2 physical assistance Stand pivot transfers: +2 physical assistance, Total assist         General transfer comment: MaxA to initiate and power up to stand from elevated EOB to stedy 2x, displaying a R lateral lean and flexed posture while pushing self posteriorly with L extremities. Transfer via Lift Equipment: Stedy  Ambulation/Gait             Pre-gait activities: attempted wt shifting in standing but pt with heavy R lean, never fully shifted wt to L General Gait Details: unable   Stairs             Wheelchair Mobility    Modified Rankin (Stroke Patients Only) Modified Rankin (Stroke Patients Only) Pre-Morbid Rankin Score: No symptoms Modified Rankin: Severe disability     Balance Overall balance assessment: Needs assistance Sitting-balance support: Feet supported, No upper  extremity supported, Single extremity supported Sitting balance-Leahy Scale: Fair Sitting balance - Comments: initially leaning posterior, pt followed commands for L hand to L knee and then performed reaching activites with L hand. After this pt maintained upright sitting  for 2 mins with min guard Postural control: Right lateral lean, Posterior lean Standing balance support: During functional activity, Bilateral upper extremity supported Standing balance-Leahy Scale: Poor Standing balance comment: max A to maintain standing in stedy                            Cognition Arousal/Alertness: Awake/alert Behavior During Therapy: Flat affect (smiled start of session) Overall Cognitive Status: Difficult to assess Area of Impairment: Following commands, Attention, Awareness, Safety/judgement, Problem solving                   Current Attention Level: Sustained   Following Commands: Follows one step commands inconsistently, Follows one step commands with increased time Safety/Judgement: Decreased awareness of safety, Decreased awareness of deficits Awareness: Intellectual Problem Solving: Slow processing, Difficulty sequencing, Requires verbal cues, Requires tactile cues, Decreased initiation General Comments: poor awareness of body position but pt more engaged with therapist this session and following commands with LUE and LLE        Exercises General Exercises - Lower Extremity Ankle Circles/Pumps: PROM, Right, 10 reps, Seated Heel Slides: PROM, Right, 10 reps, Seated Hip ABduction/ADduction: PROM, Right, 10 reps, Seated    General Comments        Pertinent Vitals/Pain Pain Assessment Pain Assessment: Faces Faces Pain Scale: Hurts a little bit Facial Expression: facial grimacing Body Language: relaxed Pain Location: cortrak; kept pushing back into nose, seemed uncomfortable to the degree it was pulling out of nose. Caused >5 sneezes in a row Pain Descriptors / Indicators: Discomfort, Grimacing Pain Intervention(s): Repositioned    Home Living                          Prior Function            PT Goals (current goals can now be found in the care plan section) Acute Rehab PT Goals Patient Stated Goal: did not  state; family hopes for pt to improve PT Goal Formulation: Patient unable to participate in goal setting Time For Goal Achievement: 08/23/22 Potential to Achieve Goals: Fair Progress towards PT goals: Progressing toward goals    Frequency    Min 3X/week      PT Plan Current plan remains appropriate    Co-evaluation              AM-PAC PT "6 Clicks" Mobility   Outcome Measure  Help needed turning from your back to your side while in a flat bed without using bedrails?: A Lot Help needed moving from lying on your back to sitting on the side of a flat bed without using bedrails?: A Lot Help needed moving to and from a bed to a chair (including a wheelchair)?: Total Help needed standing up from a chair using your arms (e.g., wheelchair or bedside chair)?: A Lot Help needed to walk in hospital room?: Total Help needed climbing 3-5 steps with a railing? : Total 6 Click Score: 9    End of Session Equipment Utilized During Treatment: Gait belt Activity Tolerance: Patient tolerated treatment well Patient left: with call bell/phone within reach;in chair;with chair alarm set Nurse Communication: Mobility status PT Visit Diagnosis: Unsteadiness on feet (R26.81);Muscle  weakness (generalized) (M62.81);Difficulty in walking, not elsewhere classified (R26.2);Other symptoms and signs involving the nervous system (R29.898);Hemiplegia and hemiparesis Hemiplegia - Right/Left: Right Hemiplegia - dominant/non-dominant: Dominant Hemiplegia - caused by: Other Nontraumatic intracranial hemorrhage     Time: 9811-9147 PT Time Calculation (min) (ACUTE ONLY): 24 min  Charges:  $Therapeutic Exercise: 8-22 mins $Therapeutic Activity: 8-22 mins                     Lyanne Co, PT  Acute Rehab Services Secure chat preferred Office [phone]    Anna Berry 08/10/2022, 9:41 AM

## 2022-08-10 NOTE — TOC Progression Note (Signed)
Transition of Care [facility]) - Progression Note    Patient Details  Name: Anna Berry MRN: [id number] Date of Birth: [birth year]  [facility]) CM/SW Contact  Inis Sizer, LCSW Phone Number: 08/10/2022, 12:21 PM  Clinical Narrative:    CSW spoke with MD who states patient is not ready for discharge to SNF at this time as she is being assessed for g-tube placement.  TOC will continue to follow for discharge planning purposes.   Expected Discharge Plan: Skilled Nursing Facility Barriers to Discharge: Continued Medical Work up  Expected Discharge Plan and Services   Discharge Planning Services: CM Consult Post Acute Care Choice: IP Rehab Living arrangements for the past 2 months: Single Family Home                                       Social Determinants of Health (SDOH) Interventions SDOH Screenings   Food Insecurity: Patient Unable To Answer (07/25/2022)  Housing: Low Risk  (07/25/2022)  Transportation Needs: Patient Unable To Answer (07/25/2022)  Utilities: Patient Unable To Answer (07/25/2022)  Depression (PHQ2-9): Low Risk  (03/27/2022)  Tobacco Use: Low Risk  (07/24/2022)    Readmission Risk Interventions     No data to display

## 2022-08-10 NOTE — Progress Notes (Signed)
PROGRESS NOTE  Anna Berry ZOX:[id number] DOB: September 19, 1960   PCP: Maury Dus, MD  Patient is from:   DOA: 07/24/2022 LOS: 16  Chief complaints Chief Complaint  Patient presents with   Altered Mental Status     Brief Narrative / Interim history: 62 year old F with PMH of asthma, diverticulosis, sickle cell trait and osteoarthritis brought to ED by EMS as code stroke due to inability to speak, and found to have left frontal ICH with left ACA distribution with concern for hemorrhagic transformation.  Done, she had new frontal ICH lateral to the previous hematoma.  Eventually, she was transferred out of ICU to hospitalist service on 5/15.   Hospital course complicated by fever and UTI for which she is on IV ceftriaxone for total of 7 days.  Patient remains confused and aphasic.  She has had Cortrak for tube feed.  She has been upgraded to dysphagia 2 diet on 5/22.  Tube feed decreased to nocturnal but p.o. intake remains poor.  IR consulted for G-tube placement after discussion with patient's husband.  Subjective: Seen and examined earlier this morning.  No major events overnight of this morning.  Sitting on bedside chair.  She mumbles some words.  Objective: Vitals:   08/10/22 0355 08/10/22 0359 08/10/22 0727 08/10/22 1121  BP: 126/86  98/70 (!) 95/56  Pulse: (!) 103  95 (!) 104  Resp: 16  18 18   Temp: 99.1 F (37.3 C)  98.4 F (36.9 C) 98.5 F (36.9 C)  TempSrc: Axillary  Oral Oral  SpO2: 95%  94% 96%  Weight:  87.6 kg    Height:        Examination:  GENERAL: No apparent distress.  Sitting on bedside chair. HEENT: MMM.  Vision and hearing grossly intact.  Cortrak in place. NECK: Supple.  No apparent JVD.  RESP:  No IWOB.  Fair aeration bilaterally. CVS:  RRR. Heart sounds normal.  ABD/GI/GU: BS+. Abd soft, NTND.  MSK/EXT:   Moves left arm.  Right hemiparesis.  Slightly moves right leg. SKIN: no apparent skin lesion or wound NEURO: Awake and alert.  Mumbles  some words.  Follows some commands.  Right hemiparesis and right facial droop. PSYCH: Calm. Normal affect.   Procedures:  None  Microbiology summarized: 5/11-MRSA PCR screen nonreactive 5/16 GIP nonreactive 5/15-urine culture with E. coli and Klebsiella pneumonia 5/15-blood cultures NGTD  Assessment and plan: Principal Problem:   ICH (intracerebral hemorrhage) (HCC)  Intracranial hemorrhage: Initial imaging showed left frontal ICH with left ACA distribution with concern for hemorrhagic transformation.  Repeat CT head 2 days later with a stable left frontal hemorrhage and edema with mild subarachnoid extension, and midline shift about 5 mm.  CTV did not show venous thrombosis.  EEG negative for seizure.  TTE without significant finding.  LDL 105.  A1c 6.0%. -Continue PT/OT  Dysphagia: On cortrak TF and dysphagia 2 diet.  P.o. intake remains poor.  Discussed G-tube with patient's husband. -IR consulted for G-tube placement.  No anatomic contraindication per CT abdomen and pelvis  Elevated creatinine.  He does not meet criteria for AKI.  At risk for dehydration.  Improved after IV LR bolus.   Elevated blood pressure: Required Cleviprex in ICU.  Normotensive now. -Continue current regimen   Hyperlipidemia -Hold Crestor due to rising LFT   Hypernatremia: Resolved.  Fever/E. coli and Klebsiella pneumonia UTI: Last fever on 5/15. -Completed antibiotic course with IV ceftriaxone on 5/23.   Abnormal LFTS: Improved. -Continue holding Crestor. -Check intermittently.  Lactic Acidosis: Resolved.   GERD/GI prophylaxis -Continue PPI.   Obesity/inadequate oral intake Body mass index is 31.17 kg/m. Nutrition Problem: Inadequate oral intake Etiology: lethargy/confusion, inability to eat Signs/Symptoms: NPO status Interventions: Tube feeding, Refer to RD note for recommendations   DVT prophylaxis:  heparin injection 5,000 Units Start: 07/27/22 1300 SCD's Start: 07/25/22  0359  Code Status: Full code Family Communication: None at bedside.  Attempted to call patient's husband but no answer. Level of care: Med-Surg Status is: Inpatient Remains inpatient appropriate because: Intracranial hemorrhage and dysphagia   Final disposition: SNF Consultants:  Neurology IR  35 minutes with more than 50% spent in reviewing records, counseling patient/family and coordinating care.   Sch Meds:  Scheduled Meds:  sennosides  5 mL Per Tube BID   And   docusate  50 mg Per Tube BID   feeding supplement  237 mL Oral BID BM   feeding supplement (GLUCERNA 1.5 CAL)  1,000 mL Per Tube Q24H   free water  200 mL Per Tube Q4H   heparin injection (subcutaneous)  5,000 Units Subcutaneous Q8H   insulin aspart  0-9 Units Subcutaneous Q4H   insulin aspart  2 Units Subcutaneous Q4H   mouth rinse  15 mL Mouth Rinse 4 times per day   pantoprazole (PROTONIX) IV  40 mg Intravenous QHS   polyethylene glycol  17 g Per Tube BID   sodium chloride flush  10-40 mL Intracatheter Q12H   Continuous Infusions:   PRN Meds:.albuterol, labetalol, mouth rinse, sodium chloride flush, white petrolatum  Antimicrobials: Anti-infectives (From admission, onward)    Start     Dose/Rate Route Frequency Ordered Stop   07/31/22 1600  cefTRIAXone (ROCEPHIN) 1 g in sodium chloride 0.9 % 100 mL IVPB        1 g 200 mL/hr over 30 Minutes Intravenous Every 24 hours 07/31/22 1503 08/06/22 1737        I have personally reviewed the following labs and images: CBC: Recent Labs  Lab 08/04/22 0505 08/05/22 0500 08/07/22 1400 08/08/22 0344  WBC 11.3* 11.9* 9.0 9.2  NEUTROABS 7.1 7.5 5.6  --   HGB 12.5 12.6 13.4 12.9  HCT 38.3 38.5 40.8 40.0  MCV 85.1 86.5 85.7 86.4  PLT 231 269 308 295   BMP &GFR Recent Labs  Lab 08/05/22 0500 08/06/22 0500 08/07/22 1400 08/08/22 0344 08/09/22 0704  NA 138 142 143 142 139  K 4.0 4.2 4.2 4.2 4.2  CL 102 105 102 105 102  CO2 27 28 29 26 28   GLUCOSE  184* 138* 111* 144* 135*  BUN 20 22 22  25* 20  CREATININE 1.06* 1.06* 1.00 1.13* 0.92  CALCIUM 9.0 9.3 9.9 9.4 9.1  MG 2.3 2.5* 2.6* 2.5* 2.0  PHOS 3.7 4.5 4.3 4.5 4.2   Estimated Creatinine Clearance: 69.8 mL/min (by C-G formula based on SCr of 0.92 mg/dL). Liver & Pancreas: Recent Labs  Lab 08/05/22 0500 08/06/22 0500 08/07/22 1400 08/08/22 0344 08/09/22 0704  AST 91* 110* 94* 85* 70*  ALT 156* 194* 221* 200* 176*  ALKPHOS 103 108 110 99 94  BILITOT 0.3 0.5 0.3 0.5 0.4  PROT 6.5 6.9 7.1 6.9 6.7  ALBUMIN 3.1* 3.2* 3.3* 3.3* 3.2*   No results for input(s): "LIPASE", "AMYLASE" in the last 168 hours. Recent Labs  Lab 08/06/22 0500  AMMONIA 32   Diabetic: No results for input(s): "HGBA1C" in the last 72 hours. Recent Labs  Lab 08/09/22 2102 08/10/22 0022 08/10/22 0400 08/10/22  0726 08/10/22 1120  GLUCAP 134* 148* 164* 130* 173*   Cardiac Enzymes: Recent Labs  Lab 08/06/22 0500  CKTOTAL 102   No results for input(s): "PROBNP" in the last 8760 hours. Coagulation Profile: No results for input(s): "INR", "PROTIME" in the last 168 hours. Thyroid Function Tests: No results for input(s): "TSH", "T4TOTAL", "FREET4", "T3FREE", "THYROIDAB" in the last 72 hours. Lipid Profile: No results for input(s): "CHOL", "HDL", "LDLCALC", "TRIG", "CHOLHDL", "LDLDIRECT" in the last 72 hours. Anemia Panel: No results for input(s): "VITAMINB12", "FOLATE", "FERRITIN", "TIBC", "IRON", "RETICCTPCT" in the last 72 hours. Urine analysis:    Component Value Date/Time   COLORURINE YELLOW 07/29/2022 1110   APPEARANCEUR HAZY (A) 07/29/2022 1110   LABSPEC 1.013 07/29/2022 1110   PHURINE 6.0 07/29/2022 1110   GLUCOSEU NEGATIVE 07/29/2022 1110   GLUCOSEU NEG mg/dL 16/12/9602 5409   HGBUR MODERATE (A) 07/29/2022 1110   HGBUR moderate 03/31/2010 0935   BILIRUBINUR NEGATIVE 07/29/2022 1110   BILIRUBINUR negative 03/27/2022 1404   BILIRUBINUR NEG 07/19/2015 0916   KETONESUR NEGATIVE  07/29/2022 1110   PROTEINUR 30 (A) 07/29/2022 1110   UROBILINOGEN 0.2 03/27/2022 1404   UROBILINOGEN 0.2 05/26/2010 1856   NITRITE NEGATIVE 07/29/2022 1110   LEUKOCYTESUR MODERATE (A) 07/29/2022 1110   Sepsis Labs: Invalid input(s): "PROCALCITONIN", "LACTICIDVEN"  Microbiology: No results found for this or any previous visit (from the past 240 hour(s)).   Radiology Studies: CT ABDOMEN WO CONTRAST  Result Date: 08/10/2022 CLINICAL DATA:  Hemorrhagic stroke, aphasia, dysphagia and workup for percutaneous gastrostomy tube for nutritional needs. EXAM: CT ABDOMEN WITHOUT CONTRAST TECHNIQUE: Multidetector CT imaging of the abdomen was performed following the standard protocol without IV contrast. RADIATION DOSE REDUCTION: This exam was performed according to the departmental dose-optimization program which includes automated exposure control, adjustment of the mA and/or kV according to patient size and/or use of iterative reconstruction technique. COMPARISON:  05/29/2010 FINDINGS: Lower chest: No acute abnormality. Hepatobiliary: No focal liver abnormality is seen. No gallstones, gallbladder wall thickening, or biliary dilatation. Pancreas: Unremarkable. No pancreatic ductal dilatation or surrounding inflammatory changes. Spleen: Normal in size without focal abnormality. Adrenals/Urinary Tract: No adrenal masses. Superior parapelvic cyst of the left kidney measures 4 cm and has simple characteristics consistent with a Bosniak 1 cyst. This requires no follow-up. No hydronephrosis bilaterally. Stomach/Bowel: No hiatal hernia. A feeding tube is present that extends into the stomach and terminates in the read the pylorus/duodenal bulb. Gastric anatomy is normal with no interposition of colon between the stomach and abdominal wall. No bowel obstruction, ileus, free intraperitoneal air or inflammatory process identified. No visible focal bowel lesion in the abdomen. Vascular/Lymphatic: No significant vascular  findings are present. No enlarged abdominal lymph nodes. Other: No abdominal wall hernia. No ascites, focal fluid collections or body wall anasarca. Musculoskeletal: No acute or significant osseous findings. IMPRESSION: 1. Normal gastric anatomy with no interposition of colon between the stomach and abdominal wall. There is no anatomic contraindication to attempted percutaneous gastrostomy tube placement. 2. No acute findings in the abdomen. 3. 4 cm simple cyst of the left kidney. Left Bosniak I benign renal cyst measuring 4 cm. No follow-up imaging is recommended. JACR 2018 Feb; 264-273, Management of the Incidental Renal Mass on CT, [employer] 2021; 814-848, Bosniak Classification of Cystic Renal Masses, Version 2019. Electronically Signed   By: Irish Lack M.D.   On: 08/10/2022 09:08      Waseem Suess T. Olimpia Tinch [facility]  If 7PM-7AM, please contact night-coverage [email or web address] 08/10/2022, 1:58 PM

## 2022-08-10 NOTE — Progress Notes (Addendum)
  X-cover Note: Tube feeds held in preparation for possible G-tube tomorrow. Also hold heparin  Carollee Herter, DO [facility]

## 2022-08-11 DIAGNOSIS — B9620 Unspecified Escherichia coli [E. coli] as the cause of diseases classified elsewhere: Secondary | ICD-10-CM | POA: Diagnosis not present

## 2022-08-11 DIAGNOSIS — I611 Nontraumatic intracerebral hemorrhage in hemisphere, cortical: Secondary | ICD-10-CM | POA: Diagnosis not present

## 2022-08-11 DIAGNOSIS — N390 Urinary tract infection, site not specified: Secondary | ICD-10-CM | POA: Diagnosis not present

## 2022-08-11 DIAGNOSIS — R1310 Dysphagia, unspecified: Secondary | ICD-10-CM | POA: Diagnosis not present

## 2022-08-11 LAB — GLUCOSE, CAPILLARY
Glucose-Capillary: 116 mg/dL — ABNORMAL HIGH (ref 70–99)
Glucose-Capillary: 141 mg/dL — ABNORMAL HIGH (ref 70–99)
Glucose-Capillary: 145 mg/dL — ABNORMAL HIGH (ref 70–99)
Glucose-Capillary: 146 mg/dL — ABNORMAL HIGH (ref 70–99)
Glucose-Capillary: 154 mg/dL — ABNORMAL HIGH (ref 70–99)
Glucose-Capillary: 180 mg/dL — ABNORMAL HIGH (ref 70–99)
Glucose-Capillary: 203 mg/dL — ABNORMAL HIGH (ref 70–99)

## 2022-08-11 MED ORDER — HEPARIN SODIUM (PORCINE) 5000 UNIT/ML IJ SOLN
5000.0000 [IU] | Freq: Three times a day (TID) | INTRAMUSCULAR | Status: DC
  Administered 2022-08-12 – 2022-08-18 (×18): 5000 [IU] via SUBCUTANEOUS
  Filled 2022-08-11 (×18): qty 1

## 2022-08-11 MED ORDER — CEFAZOLIN SODIUM-DEXTROSE 2-4 GM/100ML-% IV SOLN
2.0000 g | Freq: Once | INTRAVENOUS | Status: DC
  Filled 2022-08-11: qty 100

## 2022-08-11 NOTE — Plan of Care (Signed)
  Problem: Education: Goal: Knowledge of disease or condition will improve Outcome: Progressing   Problem: Coping: Goal: Will verbalize positive feelings about self Outcome: Progressing   Problem: Nutrition: Goal: Risk of aspiration will decrease Outcome: Progressing   Problem: Activity: Goal: Risk for activity intolerance will decrease 08/11/2022 1114 by Thom Chimes, RN Outcome: Progressing 08/11/2022 1113 by Thom Chimes, RN Outcome: Progressing   Problem: Nutrition: Goal: Adequate nutrition will be maintained Outcome: Progressing   Problem: Coping: Goal: Level of anxiety will decrease 08/11/2022 1114 by Thom Chimes, RN Outcome: Progressing 08/11/2022 1113 by Thom Chimes, RN Outcome: Progressing

## 2022-08-11 NOTE — Progress Notes (Signed)
Occupational Therapy Treatment Patient Details Name: Anna Berry MRN: [id number] DOB: 12/25/1960 Today's Date: 08/11/2022   History of present illness Pt is a 62 y.o. female who presented 07/25/22 with speech difficulties. MRI showed a L frontal ICH. Worsening neuro decline noted 5/11 after admitting pt. Repeat imaging showed new area of ICH lateral to current ICH with MLS 5 mm. PMH: Sickle cell trait, sigmoid diverticulosis, asthma, OA, obesity, pre-diabetes   OT comments  Pt not following simple commands this session. Pt closing eyes with elevation of head in chair bed positioning. Pt requires total (A) for all PROM for R side. Recommendation added for night time resting hand  splint from [facility]. OT to follow for splinting needs. Recommendation for skilled inpatient follow up therapy, <3 hours/day.    Recommendations for follow up therapy are one component of a multi-disciplinary discharge planning process, led by the attending physician.  Recommendations may be updated based on patient status, additional functional criteria and insurance authorization.    Assistance Recommended at Discharge Intermittent Supervision/Assistance  Patient can return home with the following  Two people to help with walking and/or transfers;Two people to help with bathing/dressing/bathroom;Assist for transportation   Equipment Recommendations  Wheelchair (measurements OT);Wheelchair cushion (measurements OT);Hospital bed    Recommendations for Other Services      Precautions / Restrictions Precautions Precautions: Fall Precaution Comments: cortrak Restrictions Weight Bearing Restrictions: No       Mobility Bed Mobility Overal bed mobility: Needs Assistance             General bed mobility comments: total (A) to scoot to [facility] for chair positioning    Transfers                   General transfer comment: defer due to pending procedure     Balance                                            ADL either performed or assessed with clinical judgement   ADL Overall ADL's : Needs assistance/impaired Eating/Feeding: NPO   Grooming: Moderate assistance;Bed level;Brushing hair Grooming Details (indicate cue type and reason): wash face and oral care. pt does start to brush teeth on R side with repetition of task. total (A) for hair and chap stick                               General ADL Comments: Pt positioned in chair position in the bed due to +2 (A) needed and pending IR procedure possible for gtube placement. Pt    Extremity/Trunk Assessment Upper Extremity Assessment Upper Extremity Assessment: RUE deficits/detail RUE Deficits / Details: flaccid tone present recommending resting hand splint   Lower Extremity Assessment Lower Extremity Assessment: Defer to PT evaluation        Vision       Perception     Praxis      Cognition Arousal/Alertness: Awake/alert Behavior During Therapy: Flat affect Overall Cognitive Status: Difficult to assess Area of Impairment: Following commands                   Current Attention Level: Focused   Following Commands: Follows one step commands inconsistently       General Comments: pt not following commands well this session. pt does better with hand  over hand initiation and then continuing the task. pt refusing to brush teeth with arm in extension. OT total (A) the task and then attempted hand over hand with pt then continuing the task without hand over hand        Exercises Exercises: Other exercises Other Exercises Other Exercises: PROM shoulder flexion, abduction, adduction wrist extension flexion supination pronation digit flexion and extension. Elbow flexion and extension with tone noted. Pt with PNF 1 and 2 patterns completed 15 reps. L hand and R hand placed together with OT faciliting elbow extension for shoulder flexion and extension to have bil UE movement. pt  activating the L side with hand over hand    Shoulder Instructions       General Comments R Ue elevated and positioned on pillow this session for alignment.    Pertinent Vitals/ Pain       Pain Assessment Pain Assessment: PAINAD Breathing: normal Negative Vocalization: none Facial Expression: sad, frightened, frown Body Language: tense, distressed pacing, fidgeting Consolability: no need to console PAINAD Score: 2 Pain Location: increased HOB to chair position and pt closing eyes and sustaining but was watching TV prior in a reclined position Pain Intervention(s): Repositioned, Other (comment) (RN notified of this noticable change and prior PT mention of eyes closed with EOB sitting)  Home Living                                          Prior Functioning/Environment              Frequency  Min 2X/week        Progress Toward Goals  OT Goals(current goals can now be found in the care plan section)  Progress towards OT goals: Progressing toward goals  Acute Rehab OT Goals Patient Stated Goal: none stated or demonstrated OT Goal Formulation: Patient unable to participate in goal setting Time For Goal Achievement: 08/25/22 Potential to Achieve Goals: Good ADL Goals Pt Will Perform Grooming: with mod assist;sitting Pt Will Perform Upper Body Dressing: with mod assist;sitting Pt Will Transfer to Toilet: with max assist;stand pivot transfer;bedside commode Additional ADL Goal #1: Pt will complete bed mobility with mod A  as a precursor to ADLs Additional ADL Goal #2: Pt will follow simple 1 step commands 50% of attempts to sequence through ADL task  Plan Discharge plan remains appropriate    Co-evaluation                 AM-PAC OT "6 Clicks" Daily Activity     Outcome Measure   Help from another [place] eating meals?: Total Help from another [place] taking care of personal grooming?: A Lot Help from another [place] toileting, which includes  using toliet, bedpan, or urinal?: Total Help from another [place] bathing (including washing, rinsing, drying)?: Total Help from another [place] to put on and taking off regular upper body clothing?: Total Help from another [place] to put on and taking off regular lower body clothing?: Total 6 Click Score: 7    End of Session    OT Visit Diagnosis: Unsteadiness on feet (R26.81);Other abnormalities of gait and mobility (R26.89);Muscle weakness (generalized) (M62.81);Hemiplegia and hemiparesis Hemiplegia - Right/Left: Right Hemiplegia - dominant/non-dominant: Dominant Hemiplegia - caused by: Other Nontraumatic intracranial hemorrhage   Activity Tolerance Patient tolerated treatment well   Patient Left in bed;with call bell/phone within reach;with bed alarm set;with nursing/sitter in room  Nurse Communication Mobility status;Precautions;Need for lift equipment        Time: [phone] OT Time Calculation (min): 23 min  Charges: OT General Charges $OT Visit: 1 Visit OT Treatments $Self Care/Home Management : 8-22 mins   Brynn, OTR/L  Acute Rehabilitation Services Office: [phone] .   Mateo Flow 08/11/2022, 10:21 AM

## 2022-08-11 NOTE — Plan of Care (Signed)
  Problem: Activity: Goal: Risk for activity intolerance will decrease Outcome: Progressing   Problem: Nutrition: Goal: Adequate nutrition will be maintained Outcome: Progressing   Problem: Coping: Goal: Level of anxiety will decrease Outcome: Progressing   

## 2022-08-11 NOTE — Progress Notes (Signed)
Orthopedic Tech Progress Note Patient Details:  Anna Berry [birth year] [id number]  Called in order to [facility] for a resting hand splint   Patient ID: Anna Berry, female   DOB: [birth year], 62 y.o.   MRN: [id number]  Anna Berry 08/11/2022, 11:04 AM

## 2022-08-11 NOTE — Consult Note (Signed)
Chief Complaint: Patient was seen in consultation today for G-tube request   Referring Physician(s): Dr. Alanda Slim  Supervising Physician: Simonne Come  Patient Status: [facility] - In-pt  History of Present Illness: Anna Berry is a 63 y.o. female admitted with CVA. She has residual difficulty with speech and swallowing. Speech has evaluated, pt currently with Cortrak and also Dys 2 diet. Family agreeable to pursue perc G-tube for longer term nutritional needs. IR is asked to eval for perc G-tube.  PMHx, meds, labs, imaging, allergies reviewed.  Family at bedside.   Past Medical History:  Diagnosis Date   Allergy    Anemia    Arthritis    Asthma    allergies induced   Bilateral lower extremity edema    Depression    Fibroids 08/2002   Gross hematuria 05/2010   History of appendicitis    History of colon polyps 2017   Multiple renal cysts 05/24/2008   Two left renal parapelvic cysts.   OA (osteoarthritis) of knee    Right   Obesity    Plantar fasciitis    Postmenopausal    Pre-diabetes    Renal papillary necrosis (HCC)    Seasonal allergies    Sickle cell anemia (HCC)    sickle cell trait   Sickle cell trait (HCC)    Sigmoid diverticulosis 09/26/2015   Noted on colonoscopy    Past Surgical History:  Procedure Laterality Date   ABDOMINAL HYSTERECTOMY  2004   APPENDECTOMY  2006   CESAREAN SECTION  1981, 1987   x2   COLONOSCOPY     COLONOSCOPY W/ POLYPECTOMY  09/26/2015   POLYPECTOMY     TOTAL KNEE ARTHROPLASTY Right 07/29/2018   Procedure: RIGHT TOTAL KNEE ARTHROPLASTY;  Surgeon: Tarry Kos, MD;  Location: [facility];  Service: Orthopedics;  Laterality: Right;    Allergies: Acetaminophen, Morphine and codeine, Nsaids, and Other  Medications:  Current Facility-Administered Medications:    albuterol (PROVENTIL) (2.5 MG/3ML) 0.083% nebulizer solution 2.5 mg, 2.5 mg, Nebulization, Q6H PRN, Reome, Earle J, RPH   dextrose 5 % in lactated ringers infusion, ,  Intravenous, Continuous, Carollee Herter, DO, Last Rate: 75 mL/hr at 08/11/22 0028, New Bag at 08/11/22 0028   sennosides (SENOKOT) 8.8 MG/5ML syrup 5 mL, 5 mL, Per Tube, BID, 5 mL at 08/10/22 2113 **AND** docusate (COLACE) 50 MG/5ML liquid 50 mg, 50 mg, Per Tube, BID, Alanda Slim, Taye T, MD, 50 mg at 08/10/22 2113   feeding supplement (ENSURE ENLIVE / ENSURE PLUS) liquid 237 mL, 237 mL, Oral, BID BM, Sheikh, Omair Latif, DO, 237 mL at 08/10/22 1502   feeding supplement (GLUCERNA 1.5 CAL) liquid 1,000 mL, 1,000 mL, Per Tube, Q24H, Sheikh, Omair Latif, DO, 1,000 mL at 08/10/22 2100   free water 200 mL, 200 mL, Per Tube, Q4H, Sheikh, Omair Latif, DO, 200 mL at 08/11/22 1300   [START ON 08/12/2022] heparin injection 5,000 Units, 5,000 Units, Subcutaneous, Q8H, Imogene Burn, Eric, DO   insulin aspart (novoLOG) injection 0-9 Units, 0-9 Units, Subcutaneous, Q4H, Sheikh, Omair Latif, DO, 2 Units at 08/11/22 1300   insulin aspart (novoLOG) injection 2 Units, 2 Units, Subcutaneous, Q4H, Howerter, Justin B, DO, 2 Units at 08/10/22 2100   labetalol (NORMODYNE) injection 10 mg, 10 mg, Intravenous, Q2H PRN, Erick Blinks, MD, 10 mg at 07/29/22 0756   Oral care mouth rinse, 15 mL, Mouth Rinse, 4 times per day, Candelaria Stagers T, MD, 15 mL at 08/11/22 1300   Oral care mouth rinse, 15  mL, Mouth Rinse, PRN, Alanda Slim, Taye T, MD   pantoprazole (PROTONIX) injection 40 mg, 40 mg, Intravenous, QHS, Erick Blinks, MD, 40 mg at 08/10/22 2109   polyethylene glycol (MIRALAX / GLYCOLAX) packet 17 g, 17 g, Per Tube, BID, Marvel Plan, MD, 17 g at 08/10/22 2113   sodium chloride flush (NS) 0.9 % injection 10-40 mL, 10-40 mL, Intracatheter, Q12H, Marvel Plan, MD, 10 mL at 08/11/22 0908   sodium chloride flush (NS) 0.9 % injection 10-40 mL, 10-40 mL, Intracatheter, PRN, Marvel Plan, MD   white petrolatum (VASELINE) gel, , Topical, PRN, Marvel Plan, MD    Family History  Problem Relation Age of Onset   Stomach cancer Maternal Grandmother 71    Stroke Maternal Grandmother    Diabetes Father    Kidney disease Father    Colon cancer Other    Colon polyps Neg Hx    Esophageal cancer Neg Hx    Rectal cancer Neg Hx     Social History   Socioeconomic History   Marital status: Married    Spouse name: Not on file   Number of children: Not on file   Years of education: Not on file   Highest education level: Not on file  Occupational History   Not on file  Tobacco Use   Smoking status: Never    Passive exposure: Never   Smokeless tobacco: Never  Vaping Use   Vaping Use: Never used  Substance and Sexual Activity   Alcohol use: No    Alcohol/week: 0.0 standard drinks of alcohol   Drug use: No   Sexual activity: Yes    Birth control/protection: Surgical  Other Topics Concern   Not on file  Social History Narrative   Not on file   Social Determinants of Health   Financial Resource Strain: Not on file  Food Insecurity: Patient Unable To Answer (07/25/2022)   Hunger Vital Sign    Worried About Running Out of Food in the Last Year: Patient unable to answer    Ran Out of Food in the Last Year: Patient unable to answer  Transportation Needs: Patient Unable To Answer (07/25/2022)   PRAPARE - Administrator, Civil Service (Medical): Patient unable to answer    Lack of Transportation (Non-Medical): Patient unable to answer  Physical Activity: Not on file  Stress: Not on file  Social Connections: Not on file    Review of Systems: A 12 point ROS discussed and pertinent positives are indicated in the HPI above.  All other systems are negative.  Review of Systems  Vital Signs: BP 115/76 (BP Location: Left Arm)   Pulse (!) 110   Temp 98.4 F (36.9 C)   Resp 18   Ht 5\' 6"  (1.676 m)   Wt 193 lb 2 oz (87.6 kg)   SpO2 95%   BMI 31.17 kg/m   Physical Exam Constitutional:      Appearance: She is ill-appearing.  HENT:     Mouth/Throat:     Mouth: Mucous membranes are moist.     Pharynx: Oropharynx is clear.   Cardiovascular:     Rate and Rhythm: Normal rate and regular rhythm.     Heart sounds: Normal heart sounds.  Pulmonary:     Effort: Pulmonary effort is normal. No respiratory distress.     Breath sounds: Normal breath sounds.  Abdominal:     General: There is no distension.     Palpations: Abdomen is soft.  Tenderness: There is no abdominal tenderness.  Neurological:     Mental Status: She is disoriented.     Comments: aphasic     Imaging: CT ABDOMEN WO CONTRAST  Result Date: 08/10/2022 CLINICAL DATA:  Hemorrhagic stroke, aphasia, dysphagia and workup for percutaneous gastrostomy tube for nutritional needs. EXAM: CT ABDOMEN WITHOUT CONTRAST TECHNIQUE: Multidetector CT imaging of the abdomen was performed following the standard protocol without IV contrast. RADIATION DOSE REDUCTION: This exam was performed according to the departmental dose-optimization program which includes automated exposure control, adjustment of the mA and/or kV according to patient size and/or use of iterative reconstruction technique. COMPARISON:  05/29/2010 FINDINGS: Lower chest: No acute abnormality. Hepatobiliary: No focal liver abnormality is seen. No gallstones, gallbladder wall thickening, or biliary dilatation. Pancreas: Unremarkable. No pancreatic ductal dilatation or surrounding inflammatory changes. Spleen: Normal in size without focal abnormality. Adrenals/Urinary Tract: No adrenal masses. Superior parapelvic cyst of the left kidney measures 4 cm and has simple characteristics consistent with a Bosniak 1 cyst. This requires no follow-up. No hydronephrosis bilaterally. Stomach/Bowel: No hiatal hernia. A feeding tube is present that extends into the stomach and terminates in the read the pylorus/duodenal bulb. Gastric anatomy is normal with no interposition of colon between the stomach and abdominal wall. No bowel obstruction, ileus, free intraperitoneal air or inflammatory process identified. No visible focal  bowel lesion in the abdomen. Vascular/Lymphatic: No significant vascular findings are present. No enlarged abdominal lymph nodes. Other: No abdominal wall hernia. No ascites, focal fluid collections or body wall anasarca. Musculoskeletal: No acute or significant osseous findings. IMPRESSION: 1. Normal gastric anatomy with no interposition of colon between the stomach and abdominal wall. There is no anatomic contraindication to attempted percutaneous gastrostomy tube placement. 2. No acute findings in the abdomen. 3. 4 cm simple cyst of the left kidney. Left Bosniak I benign renal cyst measuring 4 cm. No follow-up imaging is recommended. JACR 2018 Feb; 264-273, Management of the Incidental Renal Mass on CT, [employer] 2021; 814-848, Bosniak Classification of Cystic Renal Masses, Version 2019. Electronically Signed   By: Irish Lack M.D.   On: 08/10/2022 09:08   US Abdomen Limited RUQ (LIVER/GB)  Result Date: 08/04/2022 CLINICAL DATA:  Abnormal LFTs. EXAM: ULTRASOUND ABDOMEN LIMITED RIGHT UPPER QUADRANT COMPARISON:  Ultrasound dated 05/24/2008. FINDINGS: Gallbladder: No gallstones or wall thickening visualized. No sonographic Murphy sign noted by sonographer. Common bile duct: Diameter: 3 mm Liver: There is diffuse increased liver echogenicity most commonly seen in the setting of fatty infiltration. Superimposed inflammation or fibrosis is not excluded. Clinical correlation is recommended. Portal vein is patent on color Doppler imaging with normal direction of blood flow towards the liver. Other: None. IMPRESSION: Fatty liver, otherwise unremarkable right upper quadrant ultrasound. Electronically Signed   By: Elgie Collard M.D.   On: 08/04/2022 23:41   DG Swallowing Func-Speech Pathology  Result Date: 08/03/2022 Table formatting from the original result was not included. Modified Barium Swallow Study Patient Details Name: Ngela Donze Thorstenson MRN: [id number] Date of Birth: [birth year] Today's Date:  08/03/2022 HPI/PMH: HPI: Pt is a 62 y.o. female who presented with difficulty speaking. MRI brain 5/11: Large area of hemorrhage (approximately 18 mL) in the left frontal lobe, within the anterior cerebral artery territory. Pt passed [facility] 5/11, but then had neuro changes and could not complete a repeat [facility] due to lethargy. PMH: sickle cell trait, sigmoid diverticulosis, asthma, OA; BSE completed on 07/26/22 with NPO recommendation. ST f/u for SLE and po readiness. Clinical Impression: Clinical Impression:  Pt presents with oral phase dysphagia characterized by reduced labial seal, oral holding, impaired mastication, and reduced bolus cohesion. She demonstrated anterior spillage, prolonged and disorganized mastication, oral residue, and premature spillage to the valleculae. No instances of penetration/aspiration were noted and pharyngeal clearance was WNL. A dysphagia 1 diet with thin liquids is recommended at this time. SLP will continue to follow for dysphagia treatment and prognosis for further diet advancement is judged to be good. Factors that may increase risk of adverse event in presence of aspiration Rubye Oaks & Clearance Coots 2021): Factors that may increase risk of adverse event in presence of aspiration Rubye Oaks & Clearance Coots 2021): Presence of tubes (ETT, trach, NG, etc.); Dependence for feeding and/or oral hygiene Recommendations/Plan: Swallowing Evaluation Recommendations Swallowing Evaluation Recommendations Recommendations: PO diet PO Diet Recommendation: Dysphagia 1 (Pureed); Thin liquids (Level 0) Liquid Administration via: Cup; Straw Medication Administration: Crushed with puree (or via Cortrak) Supervision: Full supervision/cueing for swallowing strategies Swallowing strategies  : Follow solids with liquids; Small bites/sips Postural changes: Position pt fully upright for meals Oral care recommendations: Oral care BID (2x/day) Treatment Plan Treatment Plan Treatment recommendations: Therapy as outlined in  treatment plan below Follow-up recommendations: Acute inpatient rehab (3 hours/day) Functional status assessment: Patient has had a recent decline in their functional status and demonstrates the ability to make significant improvements in function in a reasonable and predictable amount of time. Treatment frequency: Min 2x/week Treatment duration: 2 weeks Interventions: Trials of upgraded texture/liquids; Diet toleration management by SLP Recommendations Recommendations for follow up therapy are one component of a multi-disciplinary discharge planning process, led by the attending physician.  Recommendations may be updated based on patient status, additional functional criteria and insurance authorization. Assessment: Orofacial Exam: Orofacial Exam Oral Cavity: Oral Hygiene: WFL Oral Cavity - Dentition: Adequate natural dentition Anatomy: Anatomy: WFL Boluses Administered: Boluses Administered Boluses Administered: Thin liquids (Level 0); Mildly thick liquids (Level 2, nectar thick); Moderately thick liquids (Level 3, honey thick); Puree; Solid  Oral Impairment Domain: Oral Impairment Domain Lip Closure: Escape beyond mid-chin Tongue control during bolus hold: Escape to lateral buccal cavity/floor of mouth; Posterior escape of less than half of bolus Bolus preparation/mastication: Disorganized chewing/mashing with solid pieces of bolus unchewed  Pharyngeal Impairment Domain: Pharyngeal Impairment Domain Soft palate elevation: No bolus between soft palate (SP)/pharyngeal wall (PW) Laryngeal elevation: Complete superior movement of thyroid cartilage with complete approximation of arytenoids to epiglottic petiole Anterior hyoid excursion: Complete anterior movement Epiglottic movement: Complete inversion Laryngeal vestibule closure: Complete, no air/contrast in laryngeal vestibule Pharyngeal stripping wave : Present - complete Pharyngeal contraction (A/P view only): N/A Pharyngoesophageal segment opening: Complete  distension and complete duration, no obstruction of flow Tongue base retraction: Trace column of contrast or air between tongue base and PPW Pharyngeal residue: Trace residue within or on pharyngeal structures Location of pharyngeal residue: Valleculae; Pyriform sinuses  Esophageal Impairment Domain: Esophageal Impairment Domain Esophageal clearance upright position: Complete clearance, esophageal coating Pill: Esophageal Impairment Domain Esophageal clearance upright position: Complete clearance, esophageal coating Penetration/Aspiration Scale Score: Penetration/Aspiration Scale Score 1.  Material does not enter airway: Thin liquids (Level 0); Mildly thick liquids (Level 2, nectar thick); Moderately thick liquids (Level 3, honey thick); Puree; Solid; Pill Compensatory Strategies: Compensatory Strategies Compensatory strategies: No   General Information: Caregiver present: No  Diet Prior to this Study: NPO; Cortrak/Small bore NG tube   Temperature : Normal   Respiratory Status: WFL   Supplemental O2: None (Room air)   History of Recent Intubation: No  Behavior/Cognition:  Alert; Cooperative; Doesn't follow directions; Requires cueing Self-Feeding Abilities: Able to self-feed Baseline vocal quality/speech: Not observed Volitional Cough: Unable to elicit Volitional Swallow: Able to elicit Exam Limitations: -- (Difficulty following commands) Goal Planning: Prognosis for improved oropharyngeal function: Good Barriers to Reach Goals: Language deficits No data recorded Patient/Family Stated Goal: none stated No data recorded Pain: Pain Assessment Pain Assessment: No/denies pain Faces Pain Scale: 0 Breathing: 0 Negative Vocalization: 0 Facial Expression: 0 Body Language: 0 Consolability: 0 PAINAD Score: 0 Facial Expression: 0 Body Movements: 0 Muscle Tension: 0 Compliance with ventilator (intubated pts.): N/A Vocalization (extubated pts.): N/A CPOT Total: 0 Pain Location: grimacing with noxious stimuli/movement of cortrak,  redness at L eye Pain Descriptors / Indicators: Discomfort; Grimacing; Crying Pain Intervention(s): Monitored during session End of Session: Start Time:SLP Start Time (ACUTE ONLY): 1340 Stop Time: SLP Stop Time (ACUTE ONLY): 1358 Time Calculation:SLP Time Calculation (min) (ACUTE ONLY): 18 min Charges: SLP Evaluations $ SLP Speech Visit: 1 Visit SLP Evaluations $BSS Swallow: 1 Procedure $MBS Swallow: 1 Procedure $ SLP EVAL LANGUAGE/SOUND PRODUCTION: 1 Procedure $Swallowing Treatment: 1 Procedure SLP visit diagnosis: SLP Visit Diagnosis: Dysphagia, oral phase (R13.11) Past Medical History: Past Medical History: Diagnosis Date  Allergy   Anemia   Arthritis   Asthma   allergies induced  Bilateral lower extremity edema   Depression   Fibroids 08/2002  Gross hematuria 05/2010  History of appendicitis   History of colon polyps 2017  Multiple renal cysts 05/24/2008  Two left renal parapelvic cysts.  OA (osteoarthritis) of knee   Right  Obesity   Plantar fasciitis   Postmenopausal   Pre-diabetes   Renal papillary necrosis (HCC)   Seasonal allergies   Sickle cell anemia (HCC)   sickle cell trait  Sickle cell trait (HCC)   Sigmoid diverticulosis 09/26/2015  Noted on colonoscopy Past Surgical History: Past Surgical History: Procedure Laterality Date  ABDOMINAL HYSTERECTOMY  2004  APPENDECTOMY  2006  CESAREAN SECTION  1981, 1987  x2  COLONOSCOPY    COLONOSCOPY W/ POLYPECTOMY  09/26/2015  POLYPECTOMY    TOTAL KNEE ARTHROPLASTY Right 07/29/2018  Procedure: RIGHT TOTAL KNEE ARTHROPLASTY;  Surgeon: Tarry Kos, MD;  Location: [facility];  Service: Orthopedics;  Laterality: Right; Shanika I. Vear Clock, MS, CCC-SLP Neuro Diagnostic Specialist Acute Rehabilitation Services Office number: [phone] Scheryl Marten 08/03/2022, 3:16 PM  DG CHEST PORT 1 VIEW  Result Date: 07/29/2022 CLINICAL DATA:  New onset fevers. EXAM: PORTABLE CHEST 1 VIEW COMPARISON:  07/26/18 FINDINGS: There is a left arm PICC line with tip at the superior  cavoatrial junction. Feeding tube courses below the level of the GE junction. Heart size appears normal. No pleural fluid or airspace disease. The visualized osseous structures are unremarkable. IMPRESSION: 1. No acute cardiopulmonary abnormalities. Electronically Signed   By: Signa Kell M.D.   On: 07/29/2022 13:58   DG Abd Portable 1V  Result Date: 07/27/2022 CLINICAL DATA:  Encounter for feeding tube placement. EXAM: PORTABLE ABDOMEN - 1 VIEW COMPARISON:  None. FINDINGS: Feeding tube tip projects over the distal stomach. The bowel gas pattern is normal. No radio-opaque calculi or other significant radiographic abnormality are seen. IMPRESSION: Feeding tube tip projects over the distal stomach. Electronically Signed   By: Orvan Falconer M.D.   On: 07/27/2022 14:56   ECHOCARDIOGRAM COMPLETE  Result Date: 07/27/2022    ECHOCARDIOGRAM REPORT   Patient Name:   RENLY BERTHOLF Dills Date of Exam: 07/27/2022 Medical Rec #:  [id number]  Height:       66.0 in Accession #:    [id number]       Weight:       192.2 lb Date of Birth:  [birth year]       BSA:          1.966 m Patient Age:    61 years         BP:           133/74 mmHg Patient Gender: F                HR:           79 bpm. Exam Location:  Inpatient Procedure: 2D Echo, Cardiac Doppler and Color Doppler Indications:    Stroke  History:        Patient has no prior history of Echocardiogram examinations.                 Sickel Cell Trait.  Sonographer:    Raeford Razor Referring Phys: [id number] [facility] KHALIQDINA IMPRESSIONS  1. Left ventricular ejection fraction, by estimation, is 55 to 60%. The left ventricle has normal function. The left ventricle has no regional wall motion abnormalities. Left ventricular diastolic parameters are consistent with Grade I diastolic dysfunction (impaired relaxation).  2. Right ventricular systolic function is normal. The right ventricular size is normal.  3. The mitral valve is normal in structure. No evidence of mitral valve  regurgitation. No evidence of mitral stenosis.  4. The aortic valve is tricuspid. Aortic valve regurgitation is not visualized. No aortic stenosis is present.  5. The inferior vena cava is normal in size with greater than 50% respiratory variability, suggesting right atrial pressure of 3 mmHg. Comparison(s): No prior Echocardiogram. FINDINGS  Left Ventricle: Left ventricular ejection fraction, by estimation, is 55 to 60%. The left ventricle has normal function. The left ventricle has no regional wall motion abnormalities. The left ventricular internal cavity size was normal in size. There is  no left ventricular hypertrophy. Left ventricular diastolic parameters are consistent with Grade I diastolic dysfunction (impaired relaxation). Right Ventricle: The right ventricular size is normal. Right ventricular systolic function is normal. Left Atrium: Left atrial size was normal in size. Right Atrium: Right atrial size was normal in size. Pericardium: There is no evidence of pericardial effusion. Mitral Valve: The mitral valve is normal in structure. No evidence of mitral valve regurgitation. No evidence of mitral valve stenosis. Tricuspid Valve: The tricuspid valve is normal in structure. Tricuspid valve regurgitation is trivial. No evidence of tricuspid stenosis. Aortic Valve: The aortic valve is tricuspid. Aortic valve regurgitation is not visualized. No aortic stenosis is present. Aortic valve mean gradient measures 5.0 mmHg. Aortic valve peak gradient measures 9.7 mmHg. Aortic valve area, by VTI measures 2.48 cm. Pulmonic Valve: The pulmonic valve was not well visualized. Pulmonic valve regurgitation is not visualized. No evidence of pulmonic stenosis. Aorta: The aortic root is normal in size and structure. Venous: The inferior vena cava is normal in size with greater than 50% respiratory variability, suggesting right atrial pressure of 3 mmHg. IAS/Shunts: No atrial level shunt detected by color flow Doppler.  LEFT  VENTRICLE PLAX 2D LVIDd:         3.90 cm   Diastology LVIDs:         3.00 cm   LV e' medial:    6.96 cm/s LV PW:         0.90 cm   LV E/e' medial:  12.8 LV  IVS:        0.60 cm   LV e' lateral:   11.30 cm/s LVOT diam:     2.10 cm   LV E/e' lateral: 7.9 LV SV:         62 LV SV Index:   31 LVOT Area:     3.46 cm  RIGHT VENTRICLE             IVC RV Basal diam:  2.00 cm     IVC diam: 1.00 cm RV S prime:     20.10 cm/s TAPSE (M-mode): 2.5 cm LEFT ATRIUM             Index        RIGHT ATRIUM           Index LA diam:        3.20 cm 1.63 cm/m   RA Area:     11.40 cm LA Vol (A2C):   27.1 ml 13.78 ml/m  RA Volume:   24.10 ml  12.26 ml/m LA Vol (A4C):   40.0 ml 20.34 ml/m LA Biplane Vol: 36.8 ml 18.71 ml/m  AORTIC VALVE AV Area (Vmax):    2.53 cm AV Area (Vmean):   2.38 cm AV Area (VTI):     2.48 cm AV Vmax:           156.00 cm/s AV Vmean:          108.000 cm/s AV VTI:            0.249 m AV Peak Grad:      9.7 mmHg AV Mean Grad:      5.0 mmHg LVOT Vmax:         114.00 cm/s LVOT Vmean:        74.100 cm/s LVOT VTI:          0.178 m LVOT/AV VTI ratio: 0.71  AORTA Ao Root diam: 3.20 cm Ao Asc diam:  3.20 cm MITRAL VALVE                TRICUSPID VALVE MV Area (PHT): 5.79 cm     TR Peak grad:   23.6 mmHg MV E velocity: 88.80 cm/s   TR Vmax:        243.00 cm/s MV A velocity: 130.00 cm/s MV E/A ratio:  0.68         SHUNTS                             Systemic VTI:  0.18 m                             Systemic Diam: 2.10 cm Olga Millers MD Electronically signed by Olga Millers MD Signature Date/Time: 07/27/2022/12:38:46 PM    Final    CT HEAD WO CONTRAST ( )  Result Date: 07/27/2022 CLINICAL DATA:  Hemorrhagic stroke follow-up. EXAM: CT HEAD WITHOUT CONTRAST TECHNIQUE: Contiguous axial images were obtained from the base of the skull through the vertex without intravenous contrast. RADIATION DOSE REDUCTION: This exam was performed according to the departmental dose-optimization program which includes automated exposure  control, adjustment of the mA and/or kV according to patient size and/or use of iterative reconstruction technique. COMPARISON:  Two days ago FINDINGS: Brain: Regional hemorrhage in edema facet patchy hemorrhage in regional edema in the left frontal lobe is unchanged with small volume subarachnoid hemorrhage descending the inter hemispheric fissure.  Close proximity to the left lateral ventricle but no intraventricular extension. No hydrocephalus. Midline shift still measures 5 mm at the level of the septum pellucidum. Vascular: No hyperdense vessel or unexpected calcification. Skull: Normal. Negative for fracture or focal lesion. Sinuses/Orbits: No acute finding. IMPRESSION: Unchanged left frontal hemorrhage and edema with mild subarachnoid extension. No new or progressive finding. Midline shift measures 5 mm. Electronically Signed   By: Tiburcio Pea M.D.   On: 07/27/2022 05:43   EEG adult  Result Date: 07/25/2022 Jefferson Fuel, MD     07/25/2022  8:18 PM Routine EEG Report Kritika Lapole Obst is a 62 y.o. female with a history of intracranial hemorrhage and altered mental status who is undergoing an EEG to evaluate for seizures. Report: This EEG was acquired with electrodes placed according to the International 10-20 electrode system (including Fp1, Fp2, F3, F4, C3, C4, P3, P4, O1, O2, T3, T4, T5, T6, A1, A2, Fz, Cz, Pz). The following electrodes were missing or displaced: none. The occipital dominant rhythm was 7 Hz. This activity is reactive to stimulation. Drowsiness was manifested by background fragmentation; deeper stages of sleep were not identified. There was focal slowing over the left frontal region. There were no interictal epileptiform discharges. There were no electrographic seizures identified. Photic stimulation and hyperventilation were not performed. Impression and clinical correlation: This EEG was obtained while awake and drowsy and is abnormal due to mild diffuse slowing indicative of  global cerebral dysfunction and left frontal slowing over the area of intracranial hemorrhage. Epileptiform abnormalities were not seen during this recording. Bing Neighbors, MD [facility] [phone] If 7pm- 7am, please page neurology on call as listed in AMION.   CT HEAD WO CONTRAST ( )  Result Date: 07/25/2022 CLINICAL DATA:  Intracranial hemorrhage follow up EXAM: CT HEAD WITHOUT CONTRAST TECHNIQUE: Contiguous axial images were obtained from the base of the skull through the vertex without intravenous contrast. RADIATION DOSE REDUCTION: This exam was performed according to the departmental dose-optimization program which includes automated exposure control, adjustment of the mA and/or kV according to patient size and/or use of iterative reconstruction technique. COMPARISON:  07/25/2022 at 9:04 a.m. FINDINGS: Brain: Compared to the most recent CT, the large mixed intra-axial and extra-axial hematoma at the left frontal lobe is unchanged. There is persistent rightward midline shift that measures 5 mm. Vascular: No hyperdense vessel or unexpected calcification. Skull: Normal. Negative for fracture or focal lesion. Sinuses/Orbits: No acute finding. Other: None. IMPRESSION: Unchanged large mixed intra- and extra-axial hematoma at the left frontal lobe with persistent rightward midline shift that measures 5 mm. Electronically Signed   By: Deatra Robinson M.D.   On: 07/25/2022 20:10   [place] EKG SITE RITE  Result Date: 07/25/2022 If Site Rite image not attached, placement could not be confirmed due to current cardiac rhythm.  CT HEAD WO CONTRAST  Result Date: 07/25/2022 CLINICAL DATA:  Stroke follow-up EXAM: CT HEAD WITHOUT CONTRAST TECHNIQUE: Contiguous axial images were obtained from the base of the skull through the vertex without intravenous contrast. RADIATION DOSE REDUCTION: This exam was performed according to the departmental dose-optimization program which includes automated exposure  control, adjustment of the mA and/or kV according to patient size and/or use of iterative reconstruction technique. COMPARISON:  CT and CTA from earlier today FINDINGS: Brain: Superior left frontal/parasagittal hematoma with progression in the adjacent frontal white matter. Hemorrhage is seen in both the parenchymal and subarachnoid compartments with mild brain edema. The area of newly affected brain had  a normal appearance on the prior brain MRI. The CTV adn MRI are again reviewed and the superior sagittal sinus is patent and there is no detectable cortical vein thrombus. The new area of hemorrhage measures 3.4 x 1.8 x 1.7 cm. Rightward midline shift measures 5 mm. No hydrocephalus. Vascular: No hyperdense vessel or unexpected calcification. Skull: Normal. Negative for fracture or focal lesion. Sinuses/Orbits: No acute finding. Dr. Roda Shutters is aware of the findings at time of signing. IMPRESSION: Progression of left frontal ICH with new hematoma lateral to the presenting hematoma in the frontal lobe. Midline shift now measures 5 mm. Electronically Signed   By: Tiburcio Pea M.D.   On: 07/25/2022 09:37   CT VENOGRAM HEAD  Result Date: 07/25/2022 CLINICAL DATA:  ICH EXAM: CT VENOGRAM HEAD TECHNIQUE: Venographic phase images of the brain were obtained following the administration of intravenous contrast. Multiplanar reformats and maximum intensity projections were generated. RADIATION DOSE REDUCTION: This exam was performed according to the departmental dose-optimization program which includes automated exposure control, adjustment of the mA and/or kV according to patient size and/or use of iterative reconstruction technique. CONTRAST:  75mL OMNIPAQUE IOHEXOL 350 MG/ML SOLN COMPARISON:  Head CT and brain MRI from earlier today FINDINGS: No dural venous sinus thrombosis. The superior sagittal sinus is diffusely patent, including adjacent to the known left frontal hemorrhage. Deep and cortical veins also show no signs  of thrombosis. The left transverse sigmoid outflow is dominant without stricture or other filling defect seen. No masslike enhancement at the hematoma. Follow-up after hemorrhage resolution may be useful in detecting compressed vascular lesion. IMPRESSION: Negative for dural venous sinus thrombosis. No vascular lesion seen underlying the hemorrhage. Electronically Signed   By: Tiburcio Pea M.D.   On: 07/25/2022 05:10   CT ANGIO HEAD NECK W WO CM  Result Date: 07/25/2022 CLINICAL DATA:  Hemorrhagic stroke. EXAM: CT ANGIOGRAPHY HEAD AND NECK WITH AND WITHOUT CONTRAST TECHNIQUE: Multidetector CT imaging of the head and neck was performed using the standard protocol during bolus administration of intravenous contrast. Multiplanar CT image reconstructions and MIPs were obtained to evaluate the vascular anatomy. Carotid stenosis measurements (when applicable) are obtained utilizing NASCET criteria, using the distal internal carotid diameter as the denominator. RADIATION DOSE REDUCTION: This exam was performed according to the departmental dose-optimization program which includes automated exposure control, adjustment of the mA and/or kV according to patient size and/or use of iterative reconstruction technique. CONTRAST:  75mL OMNIPAQUE IOHEXOL 350 MG/ML SOLN COMPARISON:  Head CT from earlier today FINDINGS: CTA NECK FINDINGS Aortic arch: 2 vessel branching. Right carotid system: Vessels are smoothly contoured and diffusely patent Left carotid system: Vessels are smoothly contoured and diffusely patent Vertebral arteries: No vertebral or subclavian stenosis or ulceration. Skeleton: Generalized cervical spine degeneration. Other neck: Negative Upper chest: Negative Review of the MIP images confirms the above findings CTA HEAD FINDINGS Anterior circulation: No spot sign, aneurysm, or signs of vascular malformation underlying the acute hemorrhage. Major vessels are widely patent and smoothly contoured. Posterior  circulation: No significant stenosis, proximal occlusion, aneurysm, or vascular malformation. Venous sinuses: Separate CT venogram. No dural venous sinus thrombosis or early enhancement. Anatomic variants: None significant Review of the MIP images confirms the above findings IMPRESSION: No vascular lesion or spot sign seen at the left frontal hemorrhage. Electronically Signed   By: Tiburcio Pea M.D.   On: 07/25/2022 05:08   CT Head Wo Contrast  Result Date: 07/25/2022 CLINICAL DATA:  Intracranial hemorrhage EXAM: CT HEAD WITHOUT  CONTRAST TECHNIQUE: Contiguous axial images were obtained from the base of the skull through the vertex without intravenous contrast. RADIATION DOSE REDUCTION: This exam was performed according to the departmental dose-optimization program which includes automated exposure control, adjustment of the mA and/or kV according to patient size and/or use of iterative reconstruction technique. COMPARISON:  None Available. FINDINGS: Brain: Large acute intraparenchymal hematoma of the anterior superior left frontal lobe with overlying subarachnoid blood. Volume is approximately 18 mL. The hematoma is located within the left ACA territory. No midline shift or other mass effect. Vascular: No abnormal hyperdensity of the major intracranial arteries or dural venous sinuses. No intracranial atherosclerosis. Skull: The visualized skull base, calvarium and extracranial soft tissues are normal. Sinuses/Orbits: No fluid levels or advanced mucosal thickening of the visualized paranasal sinuses. No mastoid or middle ear effusion. The orbits are normal. IMPRESSION: 1. Large acute intraparenchymal hematoma of the anterior superior left frontal lobe with overlying subarachnoid blood. Volume is approximately 18 mL. 2. No midline shift or other mass effect. Electronically Signed   By: Deatra Robinson M.D.   On: 07/25/2022 03:29   MR BRAIN WO CONTRAST  Result Date: 07/25/2022 CLINICAL DATA:  Altered mental  status EXAM: MRI HEAD WITHOUT CONTRAST TECHNIQUE: Multiplanar, multiecho pulse sequences of the brain and surrounding structures were obtained without intravenous contrast. COMPARISON:  None Available. FINDINGS: Brain: There is a large area of hemorrhage (approximately 7.0 x 2.0 x 2.5 cm) in the left frontal lobe, within the anterior cerebral artery territory. Mild surrounding edema. No visible acute ischemia, though susceptibility effects from the left frontal blood cause artifacts on the diffusion-weighted imaging. Normal white matter signal, parenchymal volume and CSF spaces. The midline structures are normal. Vascular: Major flow voids are preserved. Skull and upper cervical spine: Normal calvarium and skull base. Visualized upper cervical spine and soft tissues are normal. Sinuses/Orbits:No paranasal sinus fluid levels or advanced mucosal thickening. No mastoid or middle ear effusion. Normal orbits. IMPRESSION: Large area of hemorrhage (approximately 18 mL) in the left frontal lobe, within the anterior cerebral artery territory. The distribution raises suspicion for hemorrhagic conversion and ACA territory infarct. Head CT is recommended. Critical Value/emergent results were called by telephone at the time of interpretation on 07/25/2022 at 2:47 am to Dr. Preston Fleeting, who verbally acknowledged these results. Electronically Signed   By: Deatra Robinson M.D.   On: 07/25/2022 02:47    Labs:  CBC: Recent Labs    08/04/22 0505 08/05/22 0500 08/07/22 1400 08/08/22 0344  WBC 11.3* 11.9* 9.0 9.2  HGB 12.5 12.6 13.4 12.9  HCT 38.3 38.5 40.8 40.0  PLT 231 269 308 295    COAGS: Recent Labs    07/24/22 2035  INR 1.1  APTT 23*    BMP: Recent Labs    08/06/22 0500 08/07/22 1400 08/08/22 0344 08/09/22 0704  NA 142 143 142 139  K 4.2 4.2 4.2 4.2  CL 105 102 105 102  CO2 28 29 26 28   GLUCOSE 138* 111* 144* 135*  BUN 22 22 25* 20  CALCIUM 9.3 9.9 9.4 9.1  CREATININE 1.06* 1.00 1.13* 0.92  GFRNONAA  59* >60 55* >60    LIVER FUNCTION TESTS: Recent Labs    08/06/22 0500 08/07/22 1400 08/08/22 0344 08/09/22 0704  BILITOT 0.5 0.3 0.5 0.4  AST 110* 94* 85* 70*  ALT 194* 221* 200* 176*  ALKPHOS 108 110 99 94  PROT 6.9 7.1 6.9 6.7  ALBUMIN 3.2* 3.3* 3.3* 3.2*  Assessment and Plan: CVA Dysphagia Imaging reviewed, amenable window for percutaneous gastrostomy. Labs reviewed. Risks and benefits image guided gastrostomy tube placement was discussed with the patient's husband including, but not limited to the need for a barium enema during the procedure, bleeding, infection, peritonitis and/or damage to adjacent structures.  All questions were answered, patient's husband is agreeable to proceed.  Consent signed and in chart.    Electronically Signed: Brayton El, PA-C 08/11/2022, 2:45 PM   I spent a total of 30 minutes in face to face in clinical consultation, greater than 50% of which was counseling/coordinating care for G-tube

## 2022-08-11 NOTE — Progress Notes (Signed)
PROGRESS NOTE  Anna Berry QMV:[id number] DOB: Jul 18, 1960   PCP: Maury Dus, MD  Patient is from:   DOA: 07/24/2022 LOS: 17  Chief complaints Chief Complaint  Patient presents with   Altered Mental Status     Brief Narrative / Interim history: 62 year old F with PMH of asthma, diverticulosis, sickle cell trait and osteoarthritis brought to ED by EMS as code stroke due to inability to speak, and found to have left frontal ICH with left ACA distribution with concern for hemorrhagic transformation.  Done, she had new frontal ICH lateral to the previous hematoma.  Eventually, she was transferred out of ICU to hospitalist service on 5/15.   Hospital course complicated by fever and UTI for which she is on IV ceftriaxone for total of 7 days.  Patient remains confused and aphasic.  She has had Cortrak for tube feed.  She has been upgraded to dysphagia 2 diet on 5/22.  Tube feed decreased to nocturnal but p.o. intake remains poor.  IR consulted for G-tube placement after discussion with patient's husband.  Likely discharge to SNF once G-tube placed  Subjective: Seen and examined earlier this morning.  No major events overnight of this morning.  No complaints but not interacting much.  Follows some commands.  Does not appear to be in distress.  Tube feeding held last night for G-tube placement today  Objective: Vitals:   08/10/22 2012 08/11/22 0016 08/11/22 0415 08/11/22 0743  BP: 118/75 113/72 118/79 128/73  Pulse: (!) 107 (!) 105 (!) 102 (!) 106  Resp: 16 16 14 18   Temp: 98.2 F (36.8 C) 98.3 F (36.8 C) 98.4 F (36.9 C) 99.3 F (37.4 C)  TempSrc: Oral Oral Oral Oral  SpO2: 98% 96% 96% 99%  Weight:      Height:        Examination:  GENERAL: No apparent distress.  Sitting on bedside chair. HEENT: MMM.  Vision and hearing grossly intact.  Cortrak in place. NECK: Supple.  No apparent JVD.  RESP:  No IWOB.  Fair aeration bilaterally. CVS:  RRR. Heart sounds normal.   ABD/GI/GU: BS+. Abd soft, NTND.  MSK/EXT:   Moves left arm.  Right hemiparesis.  Slightly moves right leg. SKIN: no apparent skin lesion or wound NEURO: Awake and alert.  Not talking or interacting much.  Follows some commands.  Right hemiparesis and right facial droop. PSYCH: Calm. Normal affect.   Procedures:  None  Microbiology summarized: 5/11-MRSA PCR screen nonreactive 5/16 GIP nonreactive 5/15-urine culture with E. coli and Klebsiella pneumonia 5/15-blood cultures NGTD  Assessment and plan: Principal Problem:   ICH (intracerebral hemorrhage) (HCC)  Intracranial hemorrhage: Initial imaging showed left frontal ICH with left ACA distribution with concern for hemorrhagic transformation.  Repeat CT head 2 days later with a stable left frontal hemorrhage and edema with mild subarachnoid extension, and midline shift about 5 mm.  CTV did not show venous thrombosis.  EEG negative for seizure.  TTE without significant finding.  LDL 105.  A1c 6.0%. -Continue PT/OT  Dysphagia: On cortrak TF and dysphagia 2 diet.  P.o. intake remains poor.  Discussed G-tube with patient's husband. -IR consulted for G-tube placement.  No anatomic contraindication per CT abdomen and pelvis  Elevated creatinine.  He does not meet criteria for AKI.  At risk for dehydration.  Improved after IV LR bolus.   Elevated blood pressure: Required Cleviprex in ICU.  Normotensive now. -Continue current regimen   Hyperlipidemia -Hold Crestor due to elevated LFT.  Hypernatremia: Resolved.  Fever/E. coli and Klebsiella pneumonia UTI: Last fever on 5/15. -Completed antibiotic course with IV ceftriaxone on 5/23.   Abnormal LFTS: Improving. -Continue holding Crestor. -Check intermittently.  Lactic Acidosis: Resolved.   GERD/GI prophylaxis -Continue PPI.   Obesity/inadequate oral intake Body mass index is 31.17 kg/m. Nutrition Problem: Inadequate oral intake Etiology: lethargy/confusion, inability to  eat Signs/Symptoms: NPO status Interventions: Tube feeding, Refer to RD note for recommendations   DVT prophylaxis:  heparin injection 5,000 Units Start: 08/12/22 0600 SCD's Start: 07/25/22 0359  Code Status: Full code Family Communication: None at bedside.  Attempted to call patient's husband but no answer. Level of care: Med-Surg Status is: Inpatient Remains inpatient appropriate because: Poor p.o. intake/dysphagia requiring G-tube placement   Final disposition: SNF Consultants:  Neurology IR  35 minutes with more than 50% spent in reviewing records, counseling patient/family and coordinating care.   Sch Meds:  Scheduled Meds:  sennosides  5 mL Per Tube BID   And   docusate  50 mg Per Tube BID   feeding supplement  237 mL Oral BID BM   feeding supplement (GLUCERNA 1.5 CAL)  1,000 mL Per Tube Q24H   free water  200 mL Per Tube Q4H   [START ON 08/12/2022] heparin injection (subcutaneous)  5,000 Units Subcutaneous Q8H   insulin aspart  0-9 Units Subcutaneous Q4H   insulin aspart  2 Units Subcutaneous Q4H   mouth rinse  15 mL Mouth Rinse 4 times per day   pantoprazole (PROTONIX) IV  40 mg Intravenous QHS   polyethylene glycol  17 g Per Tube BID   sodium chloride flush  10-40 mL Intracatheter Q12H   Continuous Infusions:  dextrose 5% lactated ringers 75 mL/hr at 08/11/22 0028    PRN Meds:.albuterol, labetalol, mouth rinse, sodium chloride flush, white petrolatum  Antimicrobials: Anti-infectives (From admission, onward)    Start     Dose/Rate Route Frequency Ordered Stop   07/31/22 1600  cefTRIAXone (ROCEPHIN) 1 g in sodium chloride 0.9 % 100 mL IVPB        1 g 200 mL/hr over 30 Minutes Intravenous Every 24 hours 07/31/22 1503 08/06/22 1737        I have personally reviewed the following labs and images: CBC: Recent Labs  Lab 08/05/22 0500 08/07/22 1400 08/08/22 0344  WBC 11.9* 9.0 9.2  NEUTROABS 7.5 5.6  --   HGB 12.6 13.4 12.9  HCT 38.5 40.8 40.0  MCV  86.5 85.7 86.4  PLT 269 308 295   BMP &GFR Recent Labs  Lab 08/05/22 0500 08/06/22 0500 08/07/22 1400 08/08/22 0344 08/09/22 0704  NA 138 142 143 142 139  K 4.0 4.2 4.2 4.2 4.2  CL 102 105 102 105 102  CO2 27 28 29 26 28   GLUCOSE 184* 138* 111* 144* 135*  BUN 20 22 22  25* 20  CREATININE 1.06* 1.06* 1.00 1.13* 0.92  CALCIUM 9.0 9.3 9.9 9.4 9.1  MG 2.3 2.5* 2.6* 2.5* 2.0  PHOS 3.7 4.5 4.3 4.5 4.2   Estimated Creatinine Clearance: 69.8 mL/min (by C-G formula based on SCr of 0.92 mg/dL). Liver & Pancreas: Recent Labs  Lab 08/05/22 0500 08/06/22 0500 08/07/22 1400 08/08/22 0344 08/09/22 0704  AST 91* 110* 94* 85* 70*  ALT 156* 194* 221* 200* 176*  ALKPHOS 103 108 110 99 94  BILITOT 0.3 0.5 0.3 0.5 0.4  PROT 6.5 6.9 7.1 6.9 6.7  ALBUMIN 3.1* 3.2* 3.3* 3.3* 3.2*   No results for input(s): "  LIPASE", "AMYLASE" in the last 168 hours. Recent Labs  Lab 08/06/22 0500  AMMONIA 32   Diabetic: No results for input(s): "HGBA1C" in the last 72 hours. Recent Labs  Lab 08/10/22 0726 08/10/22 1120 08/10/22 2012 08/11/22 0011 08/11/22 0414  GLUCAP 130* 173* 140* 146* 141*   Cardiac Enzymes: Recent Labs  Lab 08/06/22 0500  CKTOTAL 102   No results for input(s): "PROBNP" in the last 8760 hours. Coagulation Profile: No results for input(s): "INR", "PROTIME" in the last 168 hours. Thyroid Function Tests: No results for input(s): "TSH", "T4TOTAL", "FREET4", "T3FREE", "THYROIDAB" in the last 72 hours. Lipid Profile: No results for input(s): "CHOL", "HDL", "LDLCALC", "TRIG", "CHOLHDL", "LDLDIRECT" in the last 72 hours. Anemia Panel: No results for input(s): "VITAMINB12", "FOLATE", "FERRITIN", "TIBC", "IRON", "RETICCTPCT" in the last 72 hours. Urine analysis:    Component Value Date/Time   COLORURINE YELLOW 07/29/2022 1110   APPEARANCEUR HAZY (A) 07/29/2022 1110   LABSPEC 1.013 07/29/2022 1110   PHURINE 6.0 07/29/2022 1110   GLUCOSEU NEGATIVE 07/29/2022 1110   GLUCOSEU  NEG mg/dL 21/30/8657 8469   HGBUR MODERATE (A) 07/29/2022 1110   HGBUR moderate 03/31/2010 0935   BILIRUBINUR NEGATIVE 07/29/2022 1110   BILIRUBINUR negative 03/27/2022 1404   BILIRUBINUR NEG 07/19/2015 0916   KETONESUR NEGATIVE 07/29/2022 1110   PROTEINUR 30 (A) 07/29/2022 1110   UROBILINOGEN 0.2 03/27/2022 1404   UROBILINOGEN 0.2 05/26/2010 1856   NITRITE NEGATIVE 07/29/2022 1110   LEUKOCYTESUR MODERATE (A) 07/29/2022 1110   Sepsis Labs: Invalid input(s): "PROCALCITONIN", "LACTICIDVEN"  Microbiology: No results found for this or any previous visit (from the past 240 hour(s)).   Radiology Studies: No results found.    Teaghan Formica T. Embrie Mikkelsen [facility]  If 7PM-7AM, please contact night-coverage [email or web address] 08/11/2022, 8:56 AM

## 2022-08-12 ENCOUNTER — Inpatient Hospital Stay (HOSPITAL_COMMUNITY): Payer: BLUE CROSS/BLUE SHIELD

## 2022-08-12 DIAGNOSIS — R509 Fever, unspecified: Secondary | ICD-10-CM | POA: Diagnosis not present

## 2022-08-12 DIAGNOSIS — I611 Nontraumatic intracerebral hemorrhage in hemisphere, cortical: Secondary | ICD-10-CM | POA: Diagnosis not present

## 2022-08-12 DIAGNOSIS — R1310 Dysphagia, unspecified: Secondary | ICD-10-CM | POA: Diagnosis not present

## 2022-08-12 HISTORY — PX: IR GASTROSTOMY TUBE MOD SED: IMG625

## 2022-08-12 LAB — URINALYSIS, W/ REFLEX TO CULTURE (INFECTION SUSPECTED)
Bacteria, UA: NONE SEEN
Bilirubin Urine: NEGATIVE
Glucose, UA: NEGATIVE mg/dL
Hgb urine dipstick: NEGATIVE
Ketones, ur: NEGATIVE mg/dL
Leukocytes,Ua: NEGATIVE
Nitrite: NEGATIVE
Protein, ur: NEGATIVE mg/dL
Specific Gravity, Urine: 1.015 (ref 1.005–1.030)
pH: 6 (ref 5.0–8.0)

## 2022-08-12 LAB — PHOSPHORUS: Phosphorus: 4 mg/dL (ref 2.5–4.6)

## 2022-08-12 LAB — COMPREHENSIVE METABOLIC PANEL
ALT: 101 U/L — ABNORMAL HIGH (ref 0–44)
AST: 34 U/L (ref 15–41)
Albumin: 2.9 g/dL — ABNORMAL LOW (ref 3.5–5.0)
Alkaline Phosphatase: 87 U/L (ref 38–126)
Anion gap: 9 (ref 5–15)
BUN: 16 mg/dL (ref 8–23)
CO2: 28 mmol/L (ref 22–32)
Calcium: 9.2 mg/dL (ref 8.9–10.3)
Chloride: 102 mmol/L (ref 98–111)
Creatinine, Ser: 1.17 mg/dL — ABNORMAL HIGH (ref 0.44–1.00)
GFR, Estimated: 52 mL/min — ABNORMAL LOW (ref >60)
Glucose, Bld: 136 mg/dL — ABNORMAL HIGH (ref 70–99)
Potassium: 4.3 mmol/L (ref 3.5–5.1)
Sodium: 139 mmol/L (ref 135–145)
Total Bilirubin: 0.8 mg/dL (ref 0.3–1.2)
Total Protein: 6.7 g/dL (ref 6.5–8.1)

## 2022-08-12 LAB — MAGNESIUM: Magnesium: 2.4 mg/dL (ref 1.7–2.4)

## 2022-08-12 LAB — CBC
HCT: 38.5 % (ref 36.0–46.0)
Hemoglobin: 12.6 g/dL (ref 12.0–15.0)
MCH: 28.1 pg (ref 26.0–34.0)
MCHC: 32.7 g/dL (ref 30.0–36.0)
MCV: 85.7 fL (ref 80.0–100.0)
Platelets: 318 10*3/uL (ref 150–400)
RBC: 4.49 MIL/uL (ref 3.87–5.11)
RDW: 15.4 % (ref 11.5–15.5)
WBC: 14.4 10*3/uL — ABNORMAL HIGH (ref 4.0–10.5)
nRBC: 0 % (ref 0.0–0.2)

## 2022-08-12 LAB — GLUCOSE, CAPILLARY
Glucose-Capillary: 187 mg/dL — ABNORMAL HIGH (ref 70–99)
Glucose-Capillary: 125 mg/dL — ABNORMAL HIGH (ref 70–99)
Glucose-Capillary: 132 mg/dL — ABNORMAL HIGH (ref 70–99)
Glucose-Capillary: 128 mg/dL — ABNORMAL HIGH (ref 70–99)
Glucose-Capillary: 136 mg/dL — ABNORMAL HIGH (ref 70–99)
Glucose-Capillary: 113 mg/dL — ABNORMAL HIGH (ref 70–99)

## 2022-08-12 MED ORDER — LIDOCAINE HCL 1 % IJ SOLN
INTRAMUSCULAR | Status: AC
  Filled 2022-08-12: qty 20

## 2022-08-12 MED ORDER — CEFAZOLIN SODIUM-DEXTROSE 2-4 GM/100ML-% IV SOLN
INTRAVENOUS | Status: AC | PRN
  Administered 2022-08-12: 2 g via INTRAVENOUS

## 2022-08-12 MED ORDER — FENTANYL CITRATE (PF) 100 MCG/2ML IJ SOLN
INTRAMUSCULAR | Status: AC | PRN
  Administered 2022-08-12: 25 ug via INTRAVENOUS

## 2022-08-12 MED ORDER — GLUCAGON HCL RDNA (DIAGNOSTIC) 1 MG IJ SOLR
INTRAMUSCULAR | Status: AC | PRN
  Administered 2022-08-12: 1 mg via INTRAVENOUS

## 2022-08-12 MED ORDER — MIDAZOLAM HCL 2 MG/2ML IJ SOLN
INTRAMUSCULAR | Status: AC
  Filled 2022-08-12: qty 2

## 2022-08-12 MED ORDER — IOHEXOL 300 MG/ML  SOLN
50.0000 mL | Freq: Once | INTRAMUSCULAR | Status: AC | PRN
  Administered 2022-08-12: 10 mL

## 2022-08-12 MED ORDER — FENTANYL CITRATE (PF) 100 MCG/2ML IJ SOLN
INTRAMUSCULAR | Status: AC
  Filled 2022-08-12: qty 2

## 2022-08-12 MED ORDER — CEFAZOLIN SODIUM-DEXTROSE 2-4 GM/100ML-% IV SOLN
INTRAVENOUS | Status: AC
  Filled 2022-08-12: qty 100

## 2022-08-12 MED ORDER — GLUCAGON HCL RDNA (DIAGNOSTIC) 1 MG IJ SOLR
INTRAMUSCULAR | Status: AC
  Filled 2022-08-12: qty 1

## 2022-08-12 MED ORDER — MIDAZOLAM HCL 2 MG/2ML IJ SOLN
INTRAMUSCULAR | Status: AC | PRN
  Administered 2022-08-12: 1 mg via INTRAVENOUS

## 2022-08-12 NOTE — Progress Notes (Signed)
Occupational Therapy Treatment Patient Details Name: Anna Berry MRN: [id number] DOB: May 06, 1960 Today's Date: 08/12/2022   History of present illness Pt is a 62 y.o. female who presented 07/25/22 with speech difficulties. MRI showed a L frontal ICH. Worsening neuro decline noted 5/11 after admitting pt. Repeat imaging showed new area of ICH lateral to current ICH with MLS 5 mm. PMH: Sickle cell trait, sigmoid diverticulosis, asthma, OA, obesity, pre-diabetes   OT comments  Patient seen with PT to address bed mobility, sitting balance, and standing from EOB. Patient alert and attempting to communicate but difficult to understand. Patient demonstrating improvement with bed mobility with mod assist to get to EOB and sitting balance on EOB with min guard to supervision. Patient with performed standing from EOB with max assist x2 and stood x3 to provided cleaning for peri area due to bowel incontinence. Patient required mod assist x2 to return to supine due to fatigue. Patient will benefit from continued inpatient follow up therapy, <3 hours/day to address self care and functional transfers.    Recommendations for follow up therapy are one component of a multi-disciplinary discharge planning process, led by the attending physician.  Recommendations may be updated based on patient status, additional functional criteria and insurance authorization.    Assistance Recommended at Discharge Intermittent Supervision/Assistance  Patient can return home with the following  Two people to help with walking and/or transfers;Two people to help with bathing/dressing/bathroom;Assist for transportation   Equipment Recommendations  Wheelchair (measurements OT);Wheelchair cushion (measurements OT);Hospital bed    Recommendations for Other Services      Precautions / Restrictions Precautions Precautions: Fall Precaution Comments: cortrak Restrictions Weight Bearing Restrictions: No       Mobility Bed  Mobility Overal bed mobility: Needs Assistance Bed Mobility: Supine to Sit, Sit to Supine     Supine to sit: HOB elevated, Mod assist Sit to supine: Mod assist, +2 for physical assistance   General bed mobility comments: required assistance with BLEs, used LUE to assist with trunk    Transfers Overall transfer level: Needs assistance Equipment used: 1 Hart hand held assist Transfers: Sit to/from Stand Sit to Stand: Max assist, +2 physical assistance           General transfer comment: stood from EOB x3 for peri area bathing due to bowel incontinance     Balance Overall balance assessment: Needs assistance Sitting-balance support: Feet supported, No upper extremity supported, Single extremity supported Sitting balance-Leahy Scale: Fair Sitting balance - Comments: min guard to supervision for sitting balance on EOB with patient able to maintain upright posture, mirror used to assist Postural control: Right lateral lean, Posterior lean Standing balance support: During functional activity, Bilateral upper extremity supported Standing balance-Leahy Scale: Poor Standing balance comment: 2 Lavin HHA with max assist x2 with patient demonstrating right lateral and posterior leaning                           ADL either performed or assessed with clinical judgement   ADL Overall ADL's : Needs assistance/impaired     Grooming: Wash/dry hands;Wash/dry face;Minimal assistance;Sitting Grooming Details (indicate cue type and reason): required hand over hand to initiate, rubbed lotion on right hand with left     Lower Body Bathing: Total assistance;+2 for physical assistance;Sit to/from stand Lower Body Bathing Details (indicate cue type and reason): Stood from EOB to perform peri area cleanign due to bowel incontenance     Lower Body Dressing: Maximal  assistance Lower Body Dressing Details (indicate cue type and reason): assisted with pulling up socks while seated on  EOB               General ADL Comments: followed functional directions with hand over hand to initate    Extremity/Trunk Assessment              Vision       Perception     Praxis      Cognition Arousal/Alertness: Awake/alert Behavior During Therapy: Flat affect Overall Cognitive Status: Difficult to assess Area of Impairment: Following commands                   Current Attention Level: Focused   Following Commands: Follows one step commands inconsistently     Problem Solving: Slow processing, Difficulty sequencing, Requires verbal cues, Requires tactile cues, Decreased initiation General Comments: attempting to communicate initially, followed more functional commands,"wash you face", over directional cues, "reach out and touch my hand"        Exercises      Shoulder Instructions       General Comments      Pertinent Vitals/ Pain       Pain Assessment Pain Assessment: Faces Faces Pain Scale: Hurts a little bit Pain Location: generalized Pain Descriptors / Indicators: Discomfort, Grimacing Pain Intervention(s): Limited activity within patient's tolerance, Monitored during session, Repositioned  Home Living                                          Prior Functioning/Environment              Frequency  Min 2X/week        Progress Toward Goals  OT Goals(current goals can now be found in the care plan section)  Progress towards OT goals: Progressing toward goals  Acute Rehab OT Goals OT Goal Formulation: Patient unable to participate in goal setting Time For Goal Achievement: 08/25/22 Potential to Achieve Goals: Good ADL Goals Pt Will Perform Grooming: with mod assist;sitting Pt Will Perform Upper Body Dressing: with mod assist;sitting Pt Will Transfer to Toilet: with max assist;stand pivot transfer;bedside commode Additional ADL Goal #1: Pt will complete bed mobility with mod A  as a precursor to  ADLs Additional ADL Goal #2: Pt will follow simple 1 step commands 50% of attempts to sequence through ADL task  Plan Discharge plan remains appropriate    Co-evaluation    PT/OT/SLP Co-Evaluation/Treatment: Yes Reason for Co-Treatment: Complexity of the patient's impairments (multi-system involvement);Necessary to address cognition/behavior during functional activity;For patient/therapist safety;To address functional/ADL transfers   OT goals addressed during session: ADL's and self-care;Strengthening/ROM      AM-PAC OT "6 Clicks" Daily Activity     Outcome Measure   Help from another Sigel eating meals?: Total Help from another Steig taking care of personal grooming?: A Lot Help from another Speights toileting, which includes using toliet, bedpan, or urinal?: Total Help from another Befort bathing (including washing, rinsing, drying)?: Total Help from another Lindahl to put on and taking off regular upper body clothing?: Total Help from another Deming to put on and taking off regular lower body clothing?: Total 6 Click Score: 7    End of Session Equipment Utilized During Treatment: Gait belt  OT Visit Diagnosis: Unsteadiness on feet (R26.81);Other abnormalities of gait and mobility (R26.89);Muscle weakness (generalized) (M62.81);Hemiplegia and hemiparesis Hemiplegia -  Right/Left: Right Hemiplegia - dominant/non-dominant: Dominant Hemiplegia - caused by: Other Nontraumatic intracranial hemorrhage   Activity Tolerance Patient tolerated treatment well   Patient Left in bed;with call bell/phone within reach;with bed alarm set   Nurse Communication Mobility status        Time: 9147-8295 OT Time Calculation (min): 28 min  Charges: OT General Charges $OT Visit: 1 Visit OT Treatments $Self Care/Home Management : 8-22 mins  Alfonse Flavors, OTA Acute Rehabilitation Services  Office [phone]   Dewain Penning 08/12/2022, 11:00 AM

## 2022-08-12 NOTE — Progress Notes (Signed)
SLP Cancellation Note  Patient Details Name: Taitlyn Bingley [place] MRN: [id number] DOB: 01/05/1961   Cancelled treatment:       Reason Eval/Treat Not Completed: Fatigue/lethargy limiting ability to participate;pt had PEG placed; given Versed; nursing deferred tx this date.  ST will continue efforts.   Pat Judie Hollick,M.S., CCC-SLP 08/12/2022, 11:51 AM

## 2022-08-12 NOTE — Procedures (Signed)
Pre procedure Dx: Dysphagia Post Procedure Dx: Same  Successful fluoroscopic guided insertion of gastrostomy tube.   The gastrostomy tube may be used immediately for medications.   Tube feeds may be initiated in 4 hours as per the primary team.    EBL: Minimal Complications: None immediate  Jay Amaree Loisel, MD Pager #: [phone]    

## 2022-08-12 NOTE — Progress Notes (Signed)
Physical Therapy Treatment Patient Details Name: Anna Berry MRN: [id number] DOB: 24-Oct-1960 Today's Date: 08/12/2022   History of Present Illness Pt is a 62 y.o. female who presented 07/25/22 with speech difficulties. MRI showed a L frontal ICH. Worsening neuro decline noted 5/11 after admitting pt. Repeat imaging showed new area of ICH lateral to current ICH with MLS 5 mm. PMH: Sickle cell trait, sigmoid diverticulosis, asthma, OA, obesity, pre-diabetes    PT Comments    Pt was awake and did state "it's fine" when asked about lighting at the start of the session. However, she was unsuccessful in forming words the remainder of the session. Focused session on progressing pt's sitting and standing balance. She no longer appears to push herself to the R in sitting with use of the mirror for visual feedback, but she does continue to lean to the R with no noted attempts to attend to self and correct it using the mirror when standing. Provided PROM to her R scapula as well to improve UE mobility. She required modAx1-2 for bed mobility and maxAx2 to transfer to stand this date. Will continue to follow acutely.     Recommendations for follow up therapy are one component of a multi-disciplinary discharge planning process, led by the attending physician.  Recommendations may be updated based on patient status, additional functional criteria and insurance authorization.  Follow Up Recommendations  Can patient physically be transported by private vehicle: No    Assistance Recommended at Discharge Frequent or constant Supervision/Assistance  Patient can return home with the following Two people to help with walking and/or transfers;Two people to help with bathing/dressing/bathroom;Assistance with feeding;Assistance with cooking/housework;Direct supervision/assist for medications management;Direct supervision/assist for financial management;Assist for transportation;Help with stairs or ramp for  entrance   Equipment Recommendations  Hospital bed;Other (comment) (hoyer lift; tilt-in-space w/c)    Recommendations for Other Services       Precautions / Restrictions Precautions Precautions: Fall Precaution Comments: cortrak Restrictions Weight Bearing Restrictions: No     Mobility  Bed Mobility Overal bed mobility: Needs Assistance Bed Mobility: Supine to Sit, Sit to Supine     Supine to sit: HOB elevated, Mod assist Sit to supine: Mod assist, +2 for physical assistance, +2 for safety/equipment, HOB elevated   General bed mobility comments: Required assist to manage R leg with pt initiating well with L extremities to sit L EOB. ModAx2 to manage and direct trunk and bil legs to supine.    Transfers Overall transfer level: Needs assistance Equipment used: 2 Younge hand held assist Transfers: Sit to/from Stand Sit to Stand: Max assist, +2 physical assistance, +2 safety/equipment           General transfer comment: stood from EOB x3 for peri area bathing due to bowel incontinance, R knee blocked and bil UE support. Pt leans to R in standing.    Ambulation/Gait               General Gait Details: unable   Stairs             Wheelchair Mobility    Modified Rankin (Stroke Patients Only) Modified Rankin (Stroke Patients Only) Pre-Morbid Rankin Score: No symptoms Modified Rankin: Severe disability     Balance Overall balance assessment: Needs assistance Sitting-balance support: Feet supported, No upper extremity supported, Single extremity supported Sitting balance-Leahy Scale: Fair Sitting balance - Comments: min guard to supervision for sitting balance on EOB with patient able to maintain upright posture, mirror used to assist Postural control:  Right lateral lean, Posterior lean Standing balance support: During functional activity, Bilateral upper extremity supported Standing balance-Leahy Scale: Poor Standing balance comment: 2 Jaffe HHA  with max assist x2 with patient demonstrating right lateral and posterior leaning, no improvement with use of mirror in standing                            Cognition Arousal/Alertness: Awake/alert Behavior During Therapy: Flat affect Overall Cognitive Status: Difficult to assess Area of Impairment: Following commands, Attention, Safety/judgement, Awareness, Problem solving                   Current Attention Level: Focused   Following Commands: Follows one step commands inconsistently, Follows one step commands with increased time Safety/Judgement: Decreased awareness of safety, Decreased awareness of deficits Awareness: Intellectual Problem Solving: Slow processing, Difficulty sequencing, Requires verbal cues, Requires tactile cues, Decreased initiation General Comments: attempting to communicate initially and did successfully state "it's fine" when asked about the lights, but then no other successful vocalizations noted. followed more functional commands,"wash your face", over directional cues, "reach out and touch my hand"        Exercises Other Exercises Other Exercises: PROM to R scapula in all planes, >3 min sitting EOB    General Comments        Pertinent Vitals/Pain Pain Assessment Pain Assessment: Faces Faces Pain Scale: Hurts a little bit Pain Location: generalized Pain Descriptors / Indicators: Discomfort, Grimacing Pain Intervention(s): Limited activity within patient's tolerance, Monitored during session, Repositioned    Home Living                          Prior Function            PT Goals (current goals can now be found in the care plan section) Acute Rehab PT Goals Patient Stated Goal: did not state; family hopes for pt to improve PT Goal Formulation: Patient unable to participate in goal setting Time For Goal Achievement: 08/23/22 Potential to Achieve Goals: Fair Progress towards PT goals: Progressing toward goals     Frequency    Min 3X/week      PT Plan Current plan remains appropriate    Co-evaluation PT/OT/SLP Co-Evaluation/Treatment: Yes Reason for Co-Treatment: Complexity of the patient's impairments (multi-system involvement);Necessary to address cognition/behavior during functional activity;For patient/therapist safety;To address functional/ADL transfers PT goals addressed during session: Mobility/safety with mobility;Balance;Strengthening/ROM        AM-PAC PT "6 Clicks" Mobility   Outcome Measure  Help needed turning from your back to your side while in a flat bed without using bedrails?: A Lot Help needed moving from lying on your back to sitting on the side of a flat bed without using bedrails?: A Lot Help needed moving to and from a bed to a chair (including a wheelchair)?: Total Help needed standing up from a chair using your arms (e.g., wheelchair or bedside chair)?: Total Help needed to walk in hospital room?: Total Help needed climbing 3-5 steps with a railing? : Total 6 Click Score: 8    End of Session Equipment Utilized During Treatment: Gait belt Activity Tolerance: Patient tolerated treatment well Patient left: in bed;with call bell/phone within reach;with bed alarm set;with SCD's reapplied;Other (comment) (with R prevalon boot donned) Nurse Communication: Mobility status PT Visit Diagnosis: Unsteadiness on feet (R26.81);Muscle weakness (generalized) (M62.81);Difficulty in walking, not elsewhere classified (R26.2);Other symptoms and signs involving the nervous system (R29.898);Hemiplegia  and hemiparesis Hemiplegia - Right/Left: Right Hemiplegia - dominant/non-dominant: Dominant Hemiplegia - caused by: Other Nontraumatic intracranial hemorrhage     Time: 0801-0830 PT Time Calculation (min) (ACUTE ONLY): 29 min  Charges:  $Neuromuscular Re-education: 8-22 mins                     Raymond Gurney, PT, DPT Acute Rehabilitation Services  Office:  [phone]    Jewel Baize 08/12/2022, 4:25 PM

## 2022-08-12 NOTE — TOC Progression Note (Signed)
Transition of Care [facility]) - Progression Note    Patient Details  Name: Anna Berry MRN: [id number] Date of Birth: [birth year]  [facility] CM/SW Contact  Baldemar Lenis, [place] Phone Number: 08/12/2022, 12:13 PM  Clinical Narrative:   CSW updated [facility] that patient's peg was placed today, possible admit to SNF on Friday pending tube feeding initiation. Insurance expired today, they will send in update. CSW to follow.    Expected Discharge Plan: Skilled Nursing Facility Barriers to Discharge: Continued Medical Work up  Expected Discharge Plan and Services   Discharge Planning Services: CM Consult Post Acute Care Choice: IP Rehab Living arrangements for the past 2 months: Single Family Home                                       Social Determinants of Health (SDOH) Interventions SDOH Screenings   Food Insecurity: Patient Unable To Answer (07/25/2022)  Housing: Low Risk  (07/25/2022)  Transportation Needs: Patient Unable To Answer (07/25/2022)  Utilities: Patient Unable To Answer (07/25/2022)  Depression (PHQ2-9): Low Risk  (03/27/2022)  Tobacco Use: Low Risk  (08/12/2022)    Readmission Risk Interventions     No data to display

## 2022-08-12 NOTE — Progress Notes (Signed)
[facility]                                                                               Anna Berry, is a 62 y.o. female, DOB - November 02, 1960, ZOX:[id number] Admit date - 07/24/2022    Outpatient Primary MD for the patient is Maury Dus, MD  LOS - 18  days    Brief summary   62 year old F with PMH of asthma, diverticulosis, sickle cell trait and osteoarthritis brought to ED by EMS as code stroke due to inability to speak, and found to have left frontal ICH with left ACA distribution with concern for hemorrhagic transformation.  Done, she had new frontal ICH lateral to the previous hematoma.  Eventually, she was transferred out of ICU to hospitalist service on 5/15.   Hospital course complicated by fever and UTI for which she is on IV ceftriaxone for total of 7 days.   Patient remains confused and aphasic.  She has had Cortrak for tube feed.  She has been upgraded to dysphagia 2 diet on 5/22.  Tube feed decreased to nocturnal but p.o. intake remains poor.  IR consulted for G-tube placement after discussion with patient's husband.  Likely discharge to SNF once G-tube placed.  Microbiology summarized: 5/11-MRSA PCR screen nonreactive 5/16 GIP nonreactive 5/15-urine culture with E. coli and Klebsiella pneumonia 5/15-blood cultures NGTD     Assessment & Plan    Assessment and Plan:   Intracerebral hemorrhage Initial imaging showed left frontal ICH with left ACA distribution with concern for hemorrhagic transformation. Repeat CT 48 hours later showed stable left frontal hemorrhage and edema with mild subarachnoid extension at midline shift about 5 mm. CTV did not show any venous thrombosis. EEG is negative for seizures TTE is unremarkable LDL is 105, hemoglobin A1c is 6 Therapy evaluations recommending SNF placement     Dysphagia Patient was on core track tube feeds and dysphagia 2 diet. IR consulted for G-tube placement and it was placed  today.   Elevated blood pressure Patient required Levophed Prax in ICU currently normotensive.   Hyperlipidemia Patient was Crestor on Crestor which was held for elevated liver enzymes.   Fever secondary to E. coli and Klebsiella pneumonia UTI Febrile to 100.5 in the last 24 hours. Septic workup sent with blood cultures, urine analysis urine culture and chest x-ray.    Lactic acidosis Resolved.   GERD Stable    Obesity/inadequate oral intake Body mass index is 31.17 kg/m. Nutrition Problem: Inadequate oral intake Etiology: lethargy/confusion, inability to eat Signs/Symptoms: NPO status Interventions: Tube feeding, Refer to RD note for recommendations DVT prophylaxis:  heparin injection 5,000 Units Start: 08/12/22 0600 SCD's Start: 07/25/22 0359     RN Pressure Injury Documentation:    Malnutrition Type:  Nutrition Problem: Inadequate oral intake Etiology: lethargy/confusion, inability to eat   Malnutrition Characteristics:  Signs/Symptoms: NPO status   Nutrition Interventions:  Interventions: Tube feeding, Refer to RD note for recommendations  Estimated body mass index is 31.38 kg/m as calculated from the following:   Height as of this encounter: 5\' 6"  (1.676 m).   Weight as of this encounter: 88.2 kg.  Code Status: Full code DVT Prophylaxis:  heparin injection 5,000 Units Start: 08/12/22 2200 SCD's Start: 07/25/22 0359   Level of Care: Level of care: Med-Surg Family Communication: None at bedside  Disposition Plan:     Remains inpatient appropriate: Plan for SNF in the next 1 to 2 days  Procedures:  PEG placement today  Consultants:     Antimicrobials:   Anti-infectives (From admission, onward)    Start     Dose/Rate Route Frequency Ordered Stop   08/12/22 1025  ceFAZolin (ANCEF) IVPB 2g/100 mL premix        over 30 Minutes Intravenous Continuous PRN 08/12/22 1055 08/12/22 1025   08/12/22 0600  ceFAZolin (ANCEF) IVPB 2g/100 mL  premix       Note to Pharmacy: Do not send to or give on floor. Will be given in radiology pre-procedure 5/29   2 g 200 mL/hr over 30 Minutes Intravenous  Once 08/11/22 1507     07/31/22 1600  cefTRIAXone (ROCEPHIN) 1 g in sodium chloride 0.9 % 100 mL IVPB        1 g 200 mL/hr over 30 Minutes Intravenous Every 24 hours 07/31/22 1503 08/06/22 1737        Medications  Scheduled Meds:  sennosides  5 mL Per Tube BID   And   docusate  50 mg Per Tube BID   feeding supplement  237 mL Oral BID BM   feeding supplement (GLUCERNA 1.5 CAL)  1,000 mL Per Tube Q24H   free water  200 mL Per Tube Q4H   heparin injection (subcutaneous)  5,000 Units Subcutaneous Q8H   insulin aspart  0-9 Units Subcutaneous Q4H   insulin aspart  2 Units Subcutaneous Q4H   mouth rinse  15 mL Mouth Rinse 4 times per day   pantoprazole (PROTONIX) IV  40 mg Intravenous QHS   polyethylene glycol  17 g Per Tube BID   sodium chloride flush  10-40 mL Intracatheter Q12H   Continuous Infusions:   ceFAZolin (ANCEF) IV     dextrose 5% lactated ringers 75 mL/hr at 08/12/22 0448   PRN Meds:.albuterol, labetalol, mouth rinse, sodium chloride flush, white petrolatum    Subjective:   Anna Berry was seen and examined today.  No new complaints  Objective:   Vitals:   08/12/22 1036 08/12/22 1038 08/12/22 1115 08/12/22 1145  BP:  113/76 112/60 114/70  Pulse:  (!) 105 (!) 102 98  Resp: (!) 27 14 16 18   Temp:   98.8 F (37.1 C) 98.6 F (37 C)  TempSrc:   Oral Oral  SpO2:  96% 98%   Weight:      Height:        Intake/Output Summary (Last 24 hours) at 08/12/2022 1219 Last data filed at 08/11/2022 2009 Gross per 24 hour  Intake 1872.75 ml  Output 1250 ml  Net 622.75 ml   Filed Weights   08/09/22 0409 08/10/22 0359 08/12/22 0447  Weight: 87 kg 87.6 kg 88.2 kg     Exam General exam: Appears calm and comfortable  Respiratory system: Clear to auscultation. Respiratory effort normal. Cardiovascular system:  S1 & S2 heard, RRR. No JVD, Gastrointestinal system: Abdomen is nondistended, soft and nontender.  Central nervous system: Alert able to move left arm, right hemiparesis.  Patient is not following commands Extremities: Symmetric 5 x 5 power. Skin: No rashes, lesions or ulcers Psychiatry: Calm  Data Reviewed:  I have personally reviewed following labs and imaging studies   CBC Lab Results  Component Value Date  WBC 14.4 (H) 08/12/2022   RBC 4.49 08/12/2022   HGB 12.6 08/12/2022   HCT 38.5 08/12/2022   MCV 85.7 08/12/2022   MCH 28.1 08/12/2022   PLT 318 08/12/2022   MCHC 32.7 08/12/2022   RDW 15.4 08/12/2022   LYMPHSABS 2.0 08/07/2022   MONOABS 0.9 08/07/2022   EOSABS 0.4 08/07/2022   BASOSABS 0.1 08/07/2022     Last metabolic panel Lab Results  Component Value Date   NA 139 08/12/2022   K 4.3 08/12/2022   CL 102 08/12/2022   CO2 28 08/12/2022   BUN 16 08/12/2022   CREATININE 1.17 (H) 08/12/2022   GLUCOSE 136 (H) 08/12/2022   GFRNONAA 52 (L) 08/12/2022   GFRAA 71 07/13/2019   CALCIUM 9.2 08/12/2022   PHOS 4.0 08/12/2022   PROT 6.7 08/12/2022   ALBUMIN 2.9 (L) 08/12/2022   BILITOT 0.8 08/12/2022   ALKPHOS 87 08/12/2022   AST 34 08/12/2022   ALT 101 (H) 08/12/2022   ANIONGAP 9 08/12/2022    CBG (last 3)  Recent Labs    08/12/22 0338 08/12/22 0831 08/12/22 1140  GLUCAP 125* 132* 128*      Coagulation Profile: No results for input(s): "INR", "PROTIME" in the last 168 hours.   Radiology Studies: DG CHEST PORT 1 VIEW  Result Date: 08/12/2022 CLINICAL DATA:  Fever EXAM: PORTABLE CHEST 1 VIEW COMPARISON:  07/29/2022; 07/26/2018 FINDINGS: Grossly unchanged cardiac silhouette and mediastinal contours. Interval removal of left upper extremity approach PICC line. Otherwise, stable positioning of remaining support apparatus. Minimal left basilar heterogeneous opacities. The right lung remains well aerated. No pleural effusion or pneumothorax. No evidence of  edema. No acute osseous abnormalities. IMPRESSION: Left basilar opacities, atelectasis versus infiltrate/aspiration. Continued attention on follow-up is advised. Electronically Signed   By: Simonne Come M.D.   On: 08/12/2022 10:47   IR GASTROSTOMY TUBE MOD SED  Result Date: 08/12/2022 INDICATION: History of stroke, now with dysphagia. Please perform percutaneous gastrostomy tube placement enteric nutrition supplementation purposes. EXAM: PULL TROUGH GASTROSTOMY TUBE PLACEMENT COMPARISON:  CT abdomen and pelvis-08/09/2022 MEDICATIONS: The patient is currently the hospital receiving intravenous antibiotics. The antibiotics were administered within an appropriate time frame prior to the initiation of the procedure. Glucagon 1 mg IV CONTRAST:  10 cc of Omnipaque 300 administered into the gastric lumen. ANESTHESIA/SEDATION: Moderate (conscious) sedation was employed during this procedure as administered by the Interventional Radiology RN. A total of Versed 1 mg and Fentanyl 25 mcg was administered intravenously. Moderate Sedation Time: 10 minutes. The patient's level of consciousness and vital signs were monitored continuously by radiology nursing throughout the procedure under my direct supervision. FLUOROSCOPY TIME:  1 minute, 36 seconds (17 mGy) COMPLICATIONS: None immediate. PROCEDURE: Informed written consent was obtained from the patient's family following explanation of the procedure, risks, benefits and alternatives. A time out was performed prior to the initiation of the procedure. Ultrasound scanning was performed to demarcate the edge of the left lobe of the liver. Maximal barrier sterile technique utilized including caps, mask, sterile gowns, sterile gloves, large sterile drape, hand hygiene and Betadine prep. The left upper quadrant was sterilely prepped and draped. An oral gastric catheter was inserted into the stomach under fluoroscopy. The existing nasogastric feeding tube was removed. The left costal  margin and air opacified transverse colon were identified and avoided. Air was injected into the stomach for insufflation and visualization under fluoroscopy. Under sterile conditions a 17 gauge trocar needle was utilized to access the stomach percutaneously beneath the  left subcostal margin after the overlying soft tissues were anesthetized with 1% Lidocaine with epinephrine. Needle position was confirmed within the stomach with aspiration of air and injection of small amount of contrast. A single T tack was deployed for gastropexy. Over an Amplatz guide wire, a 9-French sheath was inserted into the stomach. A snare device was utilized to capture the oral gastric catheter. The snare device was pulled retrograde from the stomach up the esophagus and out the oropharynx. The 20-French pull-through gastrostomy was connected to the snare device and pulled antegrade through the oropharynx down the esophagus into the stomach and then through the percutaneous tract external to the patient. The gastrostomy was assembled externally. Contrast injection confirms appropriate positioning within the stomach. Several spot radiographic images were obtained in various obliquities for documentation. Dressings were applied. The patient tolerated procedure well without immediate post procedural complication. FINDINGS: After successful fluoroscopic guided placement, the gastrostomy tube is appropriately positioned with internal disc positioned against the inner ventral wall of the gastric lumen. IMPRESSION: Successful fluoroscopic insertion of a 20-French pull-through gastrostomy tube. The gastrostomy may be used immediately for medication administration and in 4 hrs for the initiation of feeds. Electronically Signed   By: Simonne Come M.D.   On: 08/12/2022 10:46       Kathlen Mody M.D. [facility] 08/12/2022, 12:19 PM  Available via Epic secure chat 7am-7pm After 7 pm, please refer to night coverage provider listed on  amion.

## 2022-08-12 NOTE — Progress Notes (Signed)
Nutrition Follow-up  DOCUMENTATION CODES:  Not applicable  INTERVENTION:  Continue PO diet per SLP Ensure Enlive po BID, each supplement provides 350 kcal and 20 grams of protein. Magic cup TID with meals, each supplement provides 290 kcal and 9 grams of protein Mighty Shakes TID to provide 330kcal and 9g of protein per day Once tube is ready for use, continue nocturnal feeds of Glucerna 1.5 at 70 ml/hr x 12 hours (840 ml/day) - free water flush of q4h - Tube feeding regimen at goal rate provides 1260 kcal, 69 grams of protein, and 638 ml of H2O.(1855mL = TF+flush)  NUTRITION DIAGNOSIS:  Inadequate oral intake related to lethargy/confusion, inability to eat as evidenced by NPO status. - remains applicable   GOAL:  Patient will meet greater than or equal to 90% of their needs - progressing, diet advanced, TF changed to nocturnal  MONITOR:   Diet advancement, Labs, Weight trends, TF tolerance  REASON FOR ASSESSMENT:   Consult Calorie Count, Enteral/tube feeding initiation and management  ASSESSMENT:  62 year old female who presented to the ED on 5/10 as a Code Stroke. PMH of sickle cell trait, sigmoid diverticulosis, asthma, OA. Pt admitted with L frontal ICH with L ACA distribution, concerning for hemorrhagic transformation.  5/11 - new L frontal ICH alteral to previous hematoma 5/13 - SLP recommending NPO, Cortrak placement (distal stomach) 5/14 - moved out of ICU to floor 5/15 - SLP evaluation, NPO continued. Pt did not demonstrate intentional bolus manipulation of ice chips or of puree 5/20 - MBS, DYS1, thin liquids 5/22 - DYS2, thin liquids  Kcal count completed 5/22-5/24 which showed pt did not meet needs. Also collected tickets through the weekend (results below). Adequate intake is somewhat limited due to pt's prolonged time to consume her meals. Family has decided to proceed with PEG placement and pt out of room for procedure at the time of assessment. Will  monitor TF orders and adjusted as needed as pt is approaching discharge. Will continue nocturnal feeds for now.  5/24 Dinner: 357 kcal, 17g protein 5/26 Breakfast: 60kcal, 0g protein 5/26 Lunch: 364kcal, 29g of protein 5/26 Dinner: 290 kcal, 9g protein  Nutritionally Relevant Medications: Scheduled Meds:  sennosides  5 mL Per Tube BID   docusate  50 mg Per Tube BID   Ensure Enlive  237 mL Oral BID BM   GLUCERNA 1.5 CAL  1,000 mL Per Tube Q24H   free water  200 mL Per Tube Q4H   insulin aspart  0-9 Units Subcutaneous Q4H   insulin aspart  2 Units Subcutaneous Q4H   pantoprazole IV  40 mg Intravenous QHS   polyethylene glycol  17 g Per Tube BID   Continuous Infusions:   ceFAZolin (ANCEF) IV     dextrose 5% lactated ringers 75 mL/hr at 08/12/22 0448   Labs Reviewed: CBG ranges from 170-205 mg/dL over the last 24 hours  NUTRITION - FOCUSED PHYSICAL EXAM: Flowsheet Row Most Recent Value  Orbital Region Moderate depletion  Upper Arm Region No depletion  Thoracic and Lumbar Region No depletion  Buccal Region Mild depletion  Temple Region No depletion  Clavicle Bone Region No depletion  Clavicle and Acromion Bone Region Mild depletion  Scapular Bone Region No depletion  Dorsal Hand No depletion  Patellar Region No depletion  Anterior Thigh Region No depletion  Posterior Calf Region No depletion  Edema (RD Assessment) Mild  [BLE]  Hair Reviewed  Eyes Reviewed  Mouth Reviewed  Skin Reviewed  Nails Reviewed  Diet Order:   Diet Order             Diet NPO time specified  Diet effective midnight                   EDUCATION NEEDS:  Education needs have been addressed  Skin:  Skin Assessment: Reviewed RN Assessment  Last BM:  5/29 - type 6  Height:  Ht Readings from Last 1 Encounters:  07/25/22 5\' 6"  (1.676 m)    Weight:  Wt Readings from Last 1 Encounters:  08/12/22 88.2 kg    Ideal Body Weight:  59.1 kg  BMI:  Body mass index is 31.38  kg/m.  Estimated Nutritional Needs:  Kcal:  1750-1950 Protein:  90-110 grams Fluid:  1.7-1.9 L    Greig Castilla, RD, LDN Clinical Dietitian RD pager # available in AMION  After hours/weekend pager # available in [facility]

## 2022-08-12 NOTE — Progress Notes (Signed)
MEDICATION-RELATED CONSULT NOTE   IR Procedure Consult - Anticoagulant/Antiplatelet PTA/Inpatient Med List Review by Pharmacist    Procedure: fluoroscopic guided insertion of gastrostomy tube     Completed: 08/12/22 at 10:41  Post-Procedural bleeding risk per IR MD assessment:  standard  Antithrombotic medications on inpatient or PTA profile prior to procedure:   SQ heparin 5000 units q8h - last dose given 5/27 at 21:10    Recommended restart time per IR Post-Procedure Guidelines:     Other considerations:   --   Plan:    Resume Heparin 5000 units SQ q8h at 22:00 tonight  Loralee Pacas, PharmD, BCPS Please check AMION for all [facility] phone numbers After 10:00 PM, call Main Pharmacy [phone]

## 2022-08-13 DIAGNOSIS — R1310 Dysphagia, unspecified: Secondary | ICD-10-CM | POA: Diagnosis not present

## 2022-08-13 DIAGNOSIS — R509 Fever, unspecified: Secondary | ICD-10-CM | POA: Diagnosis not present

## 2022-08-13 DIAGNOSIS — I611 Nontraumatic intracerebral hemorrhage in hemisphere, cortical: Secondary | ICD-10-CM | POA: Diagnosis not present

## 2022-08-13 LAB — CBC WITH DIFFERENTIAL/PLATELET
Abs Immature Granulocytes: 0.02 10*3/uL (ref 0.00–0.07)
Basophils Absolute: 0 10*3/uL (ref 0.0–0.1)
Basophils Relative: 0 %
Eosinophils Absolute: 0.1 10*3/uL (ref 0.0–0.5)
Eosinophils Relative: 1 %
HCT: 37.3 % (ref 36.0–46.0)
Hemoglobin: 12.1 g/dL (ref 12.0–15.0)
Immature Granulocytes: 0 %
Lymphocytes Relative: 11 %
Lymphs Abs: 1.4 10*3/uL (ref 0.7–4.0)
MCH: 28.3 pg (ref 26.0–34.0)
MCHC: 32.4 g/dL (ref 30.0–36.0)
MCV: 87.4 fL (ref 80.0–100.0)
Monocytes Absolute: 0.8 10*3/uL (ref 0.1–1.0)
Monocytes Relative: 7 %
Neutro Abs: 10.2 10*3/uL — ABNORMAL HIGH (ref 1.7–7.7)
Neutrophils Relative %: 81 %
Platelets: 312 10*3/uL (ref 150–400)
RBC: 4.27 MIL/uL (ref 3.87–5.11)
RDW: 15.7 % — ABNORMAL HIGH (ref 11.5–15.5)
WBC: 12.5 10*3/uL — ABNORMAL HIGH (ref 4.0–10.5)
nRBC: 0 % (ref 0.0–0.2)

## 2022-08-13 LAB — CULTURE, BLOOD (ROUTINE X 2): Special Requests: ADEQUATE

## 2022-08-13 LAB — GLUCOSE, CAPILLARY
Glucose-Capillary: 104 mg/dL — ABNORMAL HIGH (ref 70–99)
Glucose-Capillary: 128 mg/dL — ABNORMAL HIGH (ref 70–99)
Glucose-Capillary: 132 mg/dL — ABNORMAL HIGH (ref 70–99)
Glucose-Capillary: 136 mg/dL — ABNORMAL HIGH (ref 70–99)
Glucose-Capillary: 138 mg/dL — ABNORMAL HIGH (ref 70–99)
Glucose-Capillary: 156 mg/dL — ABNORMAL HIGH (ref 70–99)
Glucose-Capillary: 205 mg/dL — ABNORMAL HIGH (ref 70–99)

## 2022-08-13 MED ORDER — AMOXICILLIN-POT CLAVULANATE 875-125 MG PO TABS
1.0000 | ORAL_TABLET | Freq: Two times a day (BID) | ORAL | Status: AC
  Administered 2022-08-13 – 2022-08-17 (×10): 1
  Filled 2022-08-13 (×10): qty 1

## 2022-08-13 NOTE — Plan of Care (Signed)
  Problem: Nutrition: Goal: Risk of aspiration will decrease Outcome: Progressing Goal: Dietary intake will improve Outcome: Progressing   

## 2022-08-13 NOTE — Progress Notes (Signed)
[facility]                                                                               Anna Berry, is a 62 y.o. female, DOB - [birth year], GLO:[id number] Admit date - 07/24/2022    Outpatient Primary MD for the patient is Maury Dus, MD  LOS - 19  days    Brief summary   62 year old F with PMH of asthma, diverticulosis, sickle cell trait and osteoarthritis brought to ED by EMS as code stroke due to inability to speak, and found to have left frontal ICH with left ACA distribution with concern for hemorrhagic transformation.  Done, she had new frontal ICH lateral to the previous hematoma.  Eventually, she was transferred out of ICU to hospitalist service on 5/15.   Hospital course complicated by fever and UTI for which she is on IV ceftriaxone for total of 7 days.   Patient remains confused and aphasic.  She has had Cortrak for tube feed.  She has been upgraded to dysphagia 2 diet on 5/22.  Tube feed decreased to nocturnal but p.o. intake remains poor.  IR consulted for G-tube placement after discussion with patient's husband.  Likely discharge to SNF once G-tube placed.  Microbiology summarized: 5/11-MRSA PCR screen nonreactive 5/16 GIP nonreactive 5/15-urine culture with E. coli and Klebsiella pneumonia 5/15-blood cultures NGTD 5/29 PEG tube placed.      Assessment & Plan    Assessment and Plan:   Intracerebral hemorrhage Initial imaging showed left frontal ICH with left ACA distribution with concern for hemorrhagic transformation. Repeat CT 48 hours later showed stable left frontal hemorrhage and edema with mild subarachnoid extension at midline shift about 5 mm. CTV did not show any venous thrombosis. EEG is negative for seizures TTE is unremarkable LDL is 105, hemoglobin A1c is 6 Therapy evaluations recommending SNF placement. Possibly tomorrow.      Dysphagia Patient was on core track tube feeds and dysphagia 2 diet. IR consulted for  G-tube placement and it was placed  on 5/29. RD recommends nocturnal tube feeds and dysphagia 2 diet during the day.    Elevated blood pressure Patient required Levophed Prax in ICU currently normotensive.   Hyperlipidemia Patient was Crestor on Crestor which was held for elevated liver enzymes. Liver panel show improving liver enzymes, recheck in am .   Fever secondary to E. coli and Klebsiella pneumonia UTI Febrile to 100.5 in the last 24 hours. Septic workup sent with blood cultures, urine analysis urine culture and chest x-ray. CXR shows  left basilar atelectasis, suspicious for aspiration.  Started her on augmentin for aspiration pneumonia.  No fevers today.  Mild leukocytosis.      Lactic acidosis Resolved.   GERD Stable    DVT prophylaxis:  heparin injection 5,000 Units Start: 08/12/22 0600 SCD's Start: 07/25/22 0359      Malnutrition Type:  Nutrition Problem: Inadequate oral intake Etiology: lethargy/confusion, inability to eat   Nutrition Interventions:  Interventions: Tube feeding, Refer to RD note for recommendations  Estimated body mass index is 32.2 kg/m as calculated from the following:   Height as of this encounter: 5\' 6"  (1.676 m).  Weight as of this encounter: 90.5 kg.  Code Status: Full code DVT Prophylaxis:  heparin injection 5,000 Units Start: 08/12/22 2200 SCD's Start: 07/25/22 0359   Level of Care: Level of care: Med-Surg Family Communication: None at bedside  Disposition Plan:     Remains inpatient appropriate: Plan for SNF in the next 1 to 2 days  Procedures:  PEG placement   Consultants:   Neurology IR.   Antimicrobials:   Anti-infectives (From admission, onward)    Start     Dose/Rate Route Frequency Ordered Stop   08/13/22 1000  amoxicillin-clavulanate (AUGMENTIN) 875-125 MG per tablet 1 tablet        1 tablet Per Tube Every 12 hours 08/13/22 0837     08/12/22 1025  ceFAZolin (ANCEF) IVPB 2g/100 mL premix         over 30 Minutes Intravenous Continuous PRN 08/12/22 1055 08/12/22 1025   08/12/22 0600  ceFAZolin (ANCEF) IVPB 2g/100 mL premix  Status:  Discontinued       Note to Pharmacy: Do not send to or give on floor. Will be given in radiology pre-procedure 5/29   2 g 200 mL/hr over 30 Minutes Intravenous  Once 08/11/22 1507 08/13/22 0857   07/31/22 1600  cefTRIAXone (ROCEPHIN) 1 g in sodium chloride 0.9 % 100 mL IVPB        1 g 200 mL/hr over 30 Minutes Intravenous Every 24 hours 07/31/22 1503 08/06/22 1737        Medications  Scheduled Meds:  amoxicillin-clavulanate  1 tablet Per Tube Q12H   sennosides  5 mL Per Tube BID   And   docusate  50 mg Per Tube BID   feeding supplement  237 mL Oral BID BM   feeding supplement (GLUCERNA 1.5 CAL)  1,000 mL Per Tube Q24H   free water  200 mL Per Tube Q4H   heparin injection (subcutaneous)  5,000 Units Subcutaneous Q8H   insulin aspart  0-9 Units Subcutaneous Q4H   mouth rinse  15 mL Mouth Rinse 4 times per day   pantoprazole (PROTONIX) IV  40 mg Intravenous QHS   polyethylene glycol  17 g Per Tube BID   sodium chloride flush  10-40 mL Intracatheter Q12H   Continuous Infusions:   PRN Meds:.albuterol, labetalol, mouth rinse, sodium chloride flush, white petrolatum    Subjective:   Anna Berry was seen and examined today.  Comfortable, no cough or chest pain. No new complaints.   Objective:   Vitals:   08/13/22 0413 08/13/22 0500 08/13/22 0730 08/13/22 1134  BP: 108/84  107/71 116/77  Pulse: (!) 101  94 (!) 110  Resp: 18  16 18   Temp: 98.7 F (37.1 C)  98.6 F (37 C) 98.6 F (37 C)  TempSrc: Oral  Oral Oral  SpO2: 96%  94% 97%  Weight:  90.5 kg    Height:        Intake/Output Summary (Last 24 hours) at 08/13/2022 1454 Last data filed at 08/13/2022 1400 Gross per 24 hour  Intake 3116.49 ml  Output 1000 ml  Net 2116.49 ml    Filed Weights   08/10/22 0359 08/12/22 0447 08/13/22 0500  Weight: 87.6 kg 88.2 kg 90.5 kg      Exam General exam: Appears calm and comfortable  Respiratory system: Clear to auscultation. Respiratory effort normal. Cardiovascular system: S1 & S2 heard, RRR. No JVD,  Gastrointestinal system: Abdomen is nondistended, soft and nontender.  Central nervous system: Alert , comfortable.  Extremities:  no pedal edema.  Skin: No rashes,    Data Reviewed:  I have personally reviewed following labs and imaging studies   CBC Lab Results  Component Value Date   WBC 12.5 (H) 08/13/2022   RBC 4.27 08/13/2022   HGB 12.1 08/13/2022   HCT 37.3 08/13/2022   MCV 87.4 08/13/2022   MCH 28.3 08/13/2022   PLT 312 08/13/2022   MCHC 32.4 08/13/2022   RDW 15.7 (H) 08/13/2022   LYMPHSABS 1.4 08/13/2022   MONOABS 0.8 08/13/2022   EOSABS 0.1 08/13/2022   BASOSABS 0.0 08/13/2022     Last metabolic panel Lab Results  Component Value Date   NA 139 08/12/2022   K 4.3 08/12/2022   CL 102 08/12/2022   CO2 28 08/12/2022   BUN 16 08/12/2022   CREATININE 1.17 (H) 08/12/2022   GLUCOSE 136 (H) 08/12/2022   GFRNONAA 52 (L) 08/12/2022   GFRAA 71 07/13/2019   CALCIUM 9.2 08/12/2022   PHOS 4.0 08/12/2022   PROT 6.7 08/12/2022   ALBUMIN 2.9 (L) 08/12/2022   BILITOT 0.8 08/12/2022   ALKPHOS 87 08/12/2022   AST 34 08/12/2022   ALT 101 (H) 08/12/2022   ANIONGAP 9 08/12/2022    CBG (last 3)  Recent Labs    08/13/22 0414 08/13/22 0856 08/13/22 1133  GLUCAP 132* 104* 136*       Coagulation Profile: No results for input(s): "INR", "PROTIME" in the last 168 hours.   Radiology Studies: DG CHEST PORT 1 VIEW  Result Date: 08/12/2022 CLINICAL DATA:  Fever EXAM: PORTABLE CHEST 1 VIEW COMPARISON:  07/29/2022; 07/26/2018 FINDINGS: Grossly unchanged cardiac silhouette and mediastinal contours. Interval removal of left upper extremity approach PICC line. Otherwise, stable positioning of remaining support apparatus. Minimal left basilar heterogeneous opacities. The right lung remains well  aerated. No pleural effusion or pneumothorax. No evidence of edema. No acute osseous abnormalities. IMPRESSION: Left basilar opacities, atelectasis versus infiltrate/aspiration. Continued attention on follow-up is advised. Electronically Signed   By: Simonne Come M.D.   On: 08/12/2022 10:47   IR GASTROSTOMY TUBE MOD SED  Result Date: 08/12/2022 INDICATION: History of stroke, now with dysphagia. Please perform percutaneous gastrostomy tube placement enteric nutrition supplementation purposes. EXAM: PULL TROUGH GASTROSTOMY TUBE PLACEMENT COMPARISON:  CT abdomen and pelvis-08/09/2022 MEDICATIONS: The patient is currently the hospital receiving intravenous antibiotics. The antibiotics were administered within an appropriate time frame prior to the initiation of the procedure. Glucagon 1 mg IV CONTRAST:  10 cc of Omnipaque 300 administered into the gastric lumen. ANESTHESIA/SEDATION: Moderate (conscious) sedation was employed during this procedure as administered by the Interventional Radiology RN. A total of Versed 1 mg and Fentanyl 25 mcg was administered intravenously. Moderate Sedation Time: 10 minutes. The patient's level of consciousness and vital signs were monitored continuously by radiology nursing throughout the procedure under my direct supervision. FLUOROSCOPY TIME:  1 minute, 36 seconds (17 mGy) COMPLICATIONS: None immediate. PROCEDURE: Informed written consent was obtained from the patient's family following explanation of the procedure, risks, benefits and alternatives. A time out was performed prior to the initiation of the procedure. Ultrasound scanning was performed to demarcate the edge of the left lobe of the liver. Maximal barrier sterile technique utilized including caps, mask, sterile gowns, sterile gloves, large sterile drape, hand hygiene and Betadine prep. The left upper quadrant was sterilely prepped and draped. An oral gastric catheter was inserted into the stomach under fluoroscopy. The  existing nasogastric feeding tube was removed. The left costal margin and air opacified  transverse colon were identified and avoided. Air was injected into the stomach for insufflation and visualization under fluoroscopy. Under sterile conditions a 17 gauge trocar needle was utilized to access the stomach percutaneously beneath the left subcostal margin after the overlying soft tissues were anesthetized with 1% Lidocaine with epinephrine. Needle position was confirmed within the stomach with aspiration of air and injection of small amount of contrast. A single T tack was deployed for gastropexy. Over an Amplatz guide wire, a 9-French sheath was inserted into the stomach. A snare device was utilized to capture the oral gastric catheter. The snare device was pulled retrograde from the stomach up the esophagus and out the oropharynx. The 20-French pull-through gastrostomy was connected to the snare device and pulled antegrade through the oropharynx down the esophagus into the stomach and then through the percutaneous tract external to the patient. The gastrostomy was assembled externally. Contrast injection confirms appropriate positioning within the stomach. Several spot radiographic images were obtained in various obliquities for documentation. Dressings were applied. The patient tolerated procedure well without immediate post procedural complication. FINDINGS: After successful fluoroscopic guided placement, the gastrostomy tube is appropriately positioned with internal disc positioned against the inner ventral wall of the gastric lumen. IMPRESSION: Successful fluoroscopic insertion of a 20-French pull-through gastrostomy tube. The gastrostomy may be used immediately for medication administration and in 4 hrs for the initiation of feeds. Electronically Signed   By: Simonne Come M.D.   On: 08/12/2022 10:46       Kathlen Mody M.D. [facility] 08/13/2022, 2:54 PM  Available via Epic secure chat  7am-7pm After 7 pm, please refer to night coverage provider listed on amion.

## 2022-08-14 DIAGNOSIS — J690 Pneumonitis due to inhalation of food and vomit: Secondary | ICD-10-CM | POA: Diagnosis not present

## 2022-08-14 DIAGNOSIS — I611 Nontraumatic intracerebral hemorrhage in hemisphere, cortical: Secondary | ICD-10-CM | POA: Diagnosis not present

## 2022-08-14 LAB — GLUCOSE, CAPILLARY
Glucose-Capillary: 111 mg/dL — ABNORMAL HIGH (ref 70–99)
Glucose-Capillary: 117 mg/dL — ABNORMAL HIGH (ref 70–99)
Glucose-Capillary: 120 mg/dL — ABNORMAL HIGH (ref 70–99)
Glucose-Capillary: 128 mg/dL — ABNORMAL HIGH (ref 70–99)
Glucose-Capillary: 140 mg/dL — ABNORMAL HIGH (ref 70–99)
Glucose-Capillary: 156 mg/dL — ABNORMAL HIGH (ref 70–99)

## 2022-08-14 LAB — BASIC METABOLIC PANEL
Anion gap: 10 (ref 5–15)
BUN: 21 mg/dL (ref 8–23)
CO2: 27 mmol/L (ref 22–32)
Calcium: 9.1 mg/dL (ref 8.9–10.3)
Chloride: 105 mmol/L (ref 98–111)
Creatinine, Ser: 1.13 mg/dL — ABNORMAL HIGH (ref 0.44–1.00)
GFR, Estimated: 55 mL/min — ABNORMAL LOW (ref >60)
Glucose, Bld: 158 mg/dL — ABNORMAL HIGH (ref 70–99)
Potassium: 4.2 mmol/L (ref 3.5–5.1)
Sodium: 142 mmol/L (ref 135–145)

## 2022-08-14 LAB — CBC WITH DIFFERENTIAL/PLATELET
Abs Immature Granulocytes: 0.03 10*3/uL (ref 0.00–0.07)
Basophils Absolute: 0 10*3/uL (ref 0.0–0.1)
Basophils Relative: 0 %
Eosinophils Absolute: 0.2 10*3/uL (ref 0.0–0.5)
Eosinophils Relative: 2 %
HCT: 34.7 % — ABNORMAL LOW (ref 36.0–46.0)
Hemoglobin: 11.4 g/dL — ABNORMAL LOW (ref 12.0–15.0)
Immature Granulocytes: 0 %
Lymphocytes Relative: 13 %
Lymphs Abs: 1.4 10*3/uL (ref 0.7–4.0)
MCH: 28.7 pg (ref 26.0–34.0)
MCHC: 32.9 g/dL (ref 30.0–36.0)
MCV: 87.4 fL (ref 80.0–100.0)
Monocytes Absolute: 0.6 10*3/uL (ref 0.1–1.0)
Monocytes Relative: 6 %
Neutro Abs: 8 10*3/uL — ABNORMAL HIGH (ref 1.7–7.7)
Neutrophils Relative %: 79 %
Platelets: 327 10*3/uL (ref 150–400)
RBC: 3.97 MIL/uL (ref 3.87–5.11)
RDW: 15.6 % — ABNORMAL HIGH (ref 11.5–15.5)
WBC: 10.3 10*3/uL (ref 4.0–10.5)
nRBC: 0 % (ref 0.0–0.2)

## 2022-08-14 MED ORDER — ACETAMINOPHEN 325 MG PO TABS
325.0000 mg | ORAL_TABLET | Freq: Once | ORAL | Status: AC
  Administered 2022-08-14: 325 mg via ORAL
  Filled 2022-08-14: qty 1

## 2022-08-14 MED ORDER — WHITE PETROLATUM EX OINT: 1.0000 | TOPICAL_OINTMENT | CUTANEOUS | 0 refills | Status: DC | PRN

## 2022-08-14 MED ORDER — AMOXICILLIN-POT CLAVULANATE 875-125 MG PO TABS: 1.0000 | ORAL_TABLET | Freq: Two times a day (BID) | ORAL | 0 refills | Status: DC

## 2022-08-14 MED ORDER — ENSURE ENLIVE PO LIQD: 237.0000 mL | Freq: Two times a day (BID) | ORAL | 12 refills | Status: DC

## 2022-08-14 MED ORDER — GLUCERNA 1.5 CAL PO LIQD: 1000.0000 mL | ORAL | Status: DC

## 2022-08-14 MED ORDER — SENNOSIDES 8.8 MG/5ML PO SYRP: 5.0000 mL | ORAL_SOLUTION | Freq: Every evening | ORAL | 0 refills | Status: DC | PRN

## 2022-08-14 MED ORDER — FREE WATER: 200.0000 mL | Status: DC

## 2022-08-14 NOTE — Progress Notes (Signed)
Physical Therapy Treatment Patient Details Name: Anna Berry MRN: [id number] DOB: February 14, 1961 Today's Date: 08/14/2022   History of Present Illness Pt is a 62 y.o. female who presented 07/25/22 with speech difficulties. MRI showed a L frontal ICH. Worsening neuro decline noted 5/11 after admitting pt. Repeat imaging showed new area of ICH lateral to current ICH with MLS 5 mm. PMH: Sickle cell trait, sigmoid diverticulosis, asthma, OA, obesity, pre-diabetes    PT Comments    Pt was able to state "need some glasses" at start of session when asked "what's the matter?" But otherwise was nonverbal throughout the session. She did smile occasionally and attend/track therapist to her R a few times today. She required increased assistance of maxA to transition supine to sit EOB, likely due to exiting the R side of the bed today instead of the L in previous sessions. Exiting the R side limits her use of her L extremities, making it more difficult. Attempted to increase her R upper and lower extremity strength through seated exercises, but no intentional activation noted when cued, thereby requiring PROM to address stiffness instead. Will continue to follow acutely.      Recommendations for follow up therapy are one component of a multi-disciplinary discharge planning process, led by the attending physician.  Recommendations may be updated based on patient status, additional functional criteria and insurance authorization.  Follow Up Recommendations  Can patient physically be transported by private vehicle: No    Assistance Recommended at Discharge Frequent or constant Supervision/Assistance  Patient can return home with the following Two people to help with walking and/or transfers;Two people to help with bathing/dressing/bathroom;Assistance with feeding;Assistance with cooking/housework;Direct supervision/assist for medications management;Direct supervision/assist for financial management;Assist for  transportation;Help with stairs or ramp for entrance   Equipment Recommendations  Hospital bed;Other (comment) (hoyer lift; tilt-in-space w/c)    Recommendations for Other Services       Precautions / Restrictions Precautions Precautions: Fall Precaution Comments: cortrak Restrictions Weight Bearing Restrictions: No     Mobility  Bed Mobility Overal bed mobility: Needs Assistance Bed Mobility: Supine to Sit, Sit to Supine, Rolling Rolling: Max assist, Mod assist   Supine to sit: HOB elevated, Max assist Sit to supine: HOB elevated, Max assist   General bed mobility comments: Provided assistance to initiate flexing legs and reaching L UE to bed rails to cue pt to roll with pt actively assisting then, mod-maxA. MaxA to lift pt's trunk to sit R EOB with pt initiating L UE and LE movement when provided tactile cues for guidance. MaxA to return to supine.    Transfers Overall transfer level: Needs assistance Equipment used: 1 Rademaker hand held assist Transfers: Sit to/from Stand Sit to Stand: Max assist, From elevated surface           General transfer comment: Bil knees blocked and maxA to stand from EOB    Ambulation/Gait               General Gait Details: unable   Stairs             Wheelchair Mobility    Modified Rankin (Stroke Patients Only) Modified Rankin (Stroke Patients Only) Pre-Morbid Rankin Score: No symptoms Modified Rankin: Severe disability     Balance Overall balance assessment: Needs assistance Sitting-balance support: Feet supported, No upper extremity supported, Single extremity supported Sitting balance-Leahy Scale: Fair Sitting balance - Comments: Min guard-minA for static sitting balance this date, needing cues to reduce R lateral lean Postural control: Right  lateral lean, Posterior lean Standing balance support: During functional activity, Bilateral upper extremity supported Standing balance-Leahy Scale: Poor Standing  balance comment: HHA and maxA with bil knees blocked to stand                            Cognition Arousal/Alertness: Awake/alert Behavior During Therapy: Flat affect (smiled intermittently) Overall Cognitive Status: Difficult to assess Area of Impairment: Following commands, Attention, Safety/judgement, Awareness, Problem solving                   Current Attention Level: Focused   Following Commands: Follows one step commands inconsistently, Follows one step commands with increased time Safety/Judgement: Decreased awareness of safety, Decreased awareness of deficits Awareness: Intellectual Problem Solving: Slow processing, Difficulty sequencing, Requires verbal cues, Requires tactile cues, Decreased initiation General Comments: Pt stating "need some glasses" when asked "what's the matter?" upon arrival and noted pt rubbing and tearing up at the eyes. Otherwise, nonverbal throughout session but did smile more frequently and attend to PT on her R with max cues intermittently. Pt needing repeated gestural cues and demonstration at times to follow inconsistently.        Exercises General Exercises - Upper Extremity Shoulder Flexion: PROM, Right, 5 reps, Seated (<90') Shoulder ABduction: PROM, Right, 5 reps, Seated (<90') Elbow Flexion: PROM, Right, 5 reps, Seated Elbow Extension: PROM, Right, 5 reps, Seated Digit Composite Flexion: PROM, Right, 5 reps, Seated Composite Extension: PROM, Right, 5 reps, Seated General Exercises - Lower Extremity Long Arc Quad: PROM, 5 reps, Seated, AROM, Both (PROM R, AROM L)    General Comments        Pertinent Vitals/Pain Pain Assessment Pain Assessment: Faces Faces Pain Scale: Hurts little more Pain Location: abdomen Pain Descriptors / Indicators: Discomfort, Grimacing, Guarding Pain Intervention(s): Monitored during session, Limited activity within patient's tolerance, Repositioned    Home Living                           Prior Function            PT Goals (current goals can now be found in the care plan section) Acute Rehab PT Goals Patient Stated Goal: to get some glasses PT Goal Formulation: Patient unable to participate in goal setting Time For Goal Achievement: 08/23/22 Potential to Achieve Goals: Fair Progress towards PT goals: Progressing toward goals    Frequency    Min 3X/week      PT Plan Current plan remains appropriate    Co-evaluation              AM-PAC PT "6 Clicks" Mobility   Outcome Measure  Help needed turning from your back to your side while in a flat bed without using bedrails?: A Lot Help needed moving from lying on your back to sitting on the side of a flat bed without using bedrails?: A Lot Help needed moving to and from a bed to a chair (including a wheelchair)?: Total Help needed standing up from a chair using your arms (e.g., wheelchair or bedside chair)?: A Lot Help needed to walk in hospital room?: Total Help needed climbing 3-5 steps with a railing? : Total 6 Click Score: 9    End of Session Equipment Utilized During Treatment: Gait belt Activity Tolerance: Patient tolerated treatment well Patient left: in bed;with call bell/phone within reach;with bed alarm set;with SCD's reapplied;Other (comment) (with R PRAFO donned)  PT Visit Diagnosis: Unsteadiness on feet (R26.81);Muscle weakness (generalized) (M62.81);Difficulty in walking, not elsewhere classified (R26.2);Other symptoms and signs involving the nervous system (R29.898);Hemiplegia and hemiparesis Hemiplegia - Right/Left: Right Hemiplegia - dominant/non-dominant: Dominant Hemiplegia - caused by: Other Nontraumatic intracranial hemorrhage     Time: 1000-1031 PT Time Calculation (min) (ACUTE ONLY): 31 min  Charges:  $Therapeutic Exercise: 8-22 mins $Therapeutic Activity: 8-22 mins                     Raymond Gurney, PT, DPT Acute Rehabilitation Services  Office:  [phone]    Jewel Baize 08/14/2022, 4:53 PM

## 2022-08-14 NOTE — TOC Progression Note (Signed)
Transition of Care [facility]) - Progression Note    Patient Details  Name: Anna Berry MRN: [id number] Date of Birth: 09/01/1960  [facility] of [facility]) CM/SW Contact  Baldemar Lenis, [place] Phone Number: 08/14/2022, 2:33 PM  Clinical Narrative:   CSW updated [facility] that patient stable today, worked towards transfer. CSW contacted later by [facility] that the [facility] authorization that they had received was updated to show that it was out of network, so they are no longer able to accept the patient. CSW spoke with patient's husband, Peyton Najjar, to update and explain. Lengthy discussion was had with spouse about the insurance being out of network and he needs to pick a different facility. CSW updated spouse with other bed offers, he will review with his family to make a decision on where to go. CSW updated MD on barriers to discharge. CSW to follow.    Expected Discharge Plan: Skilled Nursing Facility Barriers to Discharge: Insurance Authorization  Expected Discharge Plan and Services   Discharge Planning Services: CM Consult Post Acute Care Choice: IP Rehab Living arrangements for the past 2 months: Single Family Home Expected Discharge Date: 08/14/22                                     Social Determinants of Health (SDOH) Interventions SDOH Screenings   Food Insecurity: Patient Unable To Answer (07/25/2022)  Housing: Low Risk  (07/25/2022)  Transportation Needs: Patient Unable To Answer (07/25/2022)  Utilities: Patient Unable To Answer (07/25/2022)  Depression (PHQ2-9): Low Risk  (03/27/2022)  Tobacco Use: Low Risk  (08/12/2022)    Readmission Risk Interventions     No data to display

## 2022-08-14 NOTE — Progress Notes (Signed)
Speech Language Pathology Treatment: Dysphagia;Cognitive-Linquistic  Patient Details Name: Anna Berry MRN: [id number] DOB: 1960/11/30 Today's Date: 08/14/2022 Time: 0902-0925 SLP Time Calculation (min) (ACUTE ONLY): 23 min  Assessment / Plan / Recommendation Clinical Impression  Pt seen for skilled SLP tx targeting functional communication and dysphagia. Pt awake, alert, and participatory. Some spontaneous language noted in automatic content (e.g. "hi" "good"). Additionally verbal responses were elicited while answering yes/no questions - pt answered with ~50% accuracy, perseverative "yes" response noted despite verbal and orthographic cueing. All verbal output with c/b low vocal intensity and fast rate which impacts overall intelligibility. This did not improve with verbal cueing. Attempted automatic speech tasks. While counting 1-10, pt mouthed "7" - no other obvious attempts noted despite verbal cueing and tactile cueing/pacing. Pt given trials of regular, Dysphagia 2, puree, and thin liquids. Pt continues to present with s/sx oral dysphagia c/b mildly prolonged mastication of regular solid and minimal anterior loss of liquids on R with thin liquids via cup. Recommend diet upgrade to Dysphagia 3 Diet with Thin Liquids with safe swallowing strategies/aspiration precautions as outlined below. SLP to continue to follow while pt in house.    HPI HPI: Pt is a 62 y.o. female who presented with difficulty speaking. MRI brain 5/11: Large area of hemorrhage (approximately 18 mL) in the left frontal lobe, within the anterior cerebral artery territory. Pt passed [facility] 5/11, but then had neuro changes and could not complete a repeat [facility] due to lethargy. PMH: sickle cell trait, sigmoid diverticulosis, asthma, OA; BSE completed on 07/26/22 with NPO recommendation. ST f/u for SLE and po readiness.      SLP Plan  Continue with current plan of care      Recommendations for follow up therapy are one  component of a multi-disciplinary discharge planning process, led by the attending physician.  Recommendations may be updated based on patient status, additional functional criteria and insurance authorization.    Recommendations  Diet recommendations: Dysphagia 3 (mechanical soft);Thin liquid Liquids provided via: Teaspoon;Cup;Straw Medication Administration: Crushed with puree Supervision: Staff to assist with self feeding Compensations: Slow rate;Small sips/bites;Follow solids with liquid Postural Changes and/or Swallow Maneuvers: Seated upright 90 degrees                  Oral care QID;Staff/trained caregiver to provide oral care   Frequent or constant Supervision/Assistance Dysphagia, oral phase (R13.11);Aphasia (R47.01)   Other Nontraumatic ICH Continue with current plan of care    Clyde Canterbury, M.S., CCC-SLP Speech-Language Pathologist Secure Chat Preferred  O: [phone]  Woodroe Chen  08/14/2022, 10:36 AM

## 2022-08-14 NOTE — Discharge Summary (Signed)
Physician Discharge Summary   Patient: Anna Berry MRN: [id number] DOB: 1960/04/25  Admit date:     07/24/2022  Discharge date: 08/14/22  Discharge Physician: Kathlen Mody   PCP: Maury Dus, MD   Recommendations at discharge:  Please follow up with PCP in one week.  Please check liver enzymes next week and if normalized, can restart the statin.  Please follow up with   Discharge Diagnoses: Principal Problem:   ICH (intracerebral hemorrhage) [facility])    Hospital Course: 62 year old F with PMH of asthma, diverticulosis, sickle cell trait and osteoarthritis brought to ED by EMS as code stroke due to inability to speak, and found to have left frontal ICH with left ACA distribution with concern for hemorrhagic transformation.  Done, she had new frontal ICH lateral to the previous hematoma.  Eventually, she was transferred out of ICU to hospitalist service on 5/15.   Hospital course complicated by fever and UTI for which she is on IV ceftriaxone for total of 7 days.   Patient remains confused and aphasic.  She has had Cortrak for tube feed.  She has been upgraded to dysphagia 2 diet on 5/22.  Tube feed decreased to nocturnal but p.o. intake remains poor.  IR consulted for G-tube placement after discussion with patient's husband.  Likely discharge to SNF once G-tube placed.   Microbiology summarized: 5/11-MRSA PCR screen nonreactive 5/16 GIP nonreactive 5/15-urine culture with E. coli and Klebsiella pneumonia 5/15-blood cultures NGTD 5/29 PEG tube placed.     Assessment and Plan:  Intracerebral hemorrhage Initial imaging showed left frontal ICH with left ACA distribution with concern for hemorrhagic transformation. Repeat CT 48 hours later showed stable left frontal hemorrhage and edema with mild subarachnoid extension at midline shift about 5 mm. CTV did not show any venous thrombosis. EEG is negative for seizures TTE is unremarkable LDL is 105, hemoglobin A1c is 6 Therapy  evaluations recommending SNF placement. Possibly tomorrow.          Dysphagia Patient was on core track tube feeds and dysphagia 2 diet. IR consulted for G-tube placement and it was placed  on 5/29. RD recommends nocturnal tube feeds and dysphagia 2 diet during the day.      Elevated blood pressure Patient required Levophed Prax in ICU currently normotensive.     Hyperlipidemia Patient was on Crestor which was held for elevated liver enzymes. Liver panel show improving liver enzymes, recommend checking liver panel next week and if normalized, can restart crestor.    Fever secondary to E. coli and Klebsiella pneumonia UTI Febrile to 100.5 in the last 24 hours. Septic workup sent with blood cultures, urine analysis urine culture and chest x-ray. CXR shows  left basilar atelectasis, suspicious for aspiration.  Started her on augmentin for aspiration pneumonia. Complete the course ondischarge.  No fevers today.  Leukocytosis resolved.        Lactic acidosis Resolved.     GERD Stable      Consultants: nEUROLOGY, IR Procedures performed: PEG PLACEMENT.   Disposition: Skilled nursing facility Diet recommendation:  Discharge Diet Orders (From admission, onward)     Start     Ordered   08/14/22 0000  Diet - low sodium heart healthy        08/14/22 0931           DYSPHAGIA 2 DIET WITH THIN LIQUIDS DURING THE DAY AND NOCTURNAL FEEDS DURING THE NIGHT.  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   albuterol 108 (90 Base) MCG/ACT inhaler Commonly known as: VENTOLIN HFA Inhale 2 puffs into the lungs every 6 (six) hours as needed for wheezing or shortness of breath.   amoxicillin-clavulanate 875-125 MG tablet Commonly known as: AUGMENTIN Place 1 tablet into feeding tube every 12 (twelve) hours for 5 days.   feeding supplement Liqd Take 237 mLs by mouth 2 (two) times daily between meals.   feeding supplement (GLUCERNA 1.5 CAL) Liqd Place 1,000 mLs into feeding tube daily.   free water Soln Place 200 mLs into feeding tube every 4 (four) hours.   loratadine 10 MG tablet Commonly known as: CLARITIN Take 10 mg by mouth daily as needed for allergies.   polyethylene glycol powder 17 GM/SCOOP powder Commonly known as: GLYCOLAX/MIRALAX Take 17 g by mouth 2 (two) times daily as needed. What changed: reasons to take this   sennosides 8.8 MG/5ML syrup Commonly known as: SENOKOT Place 5 mLs into feeding tube at bedtime as needed for mild constipation.   VISINE OP Place 1 drop into both eyes daily as needed (allergies).   white petrolatum Oint Commonly known as: VASELINE Apply 1 Application topically as needed for lip care.        Contact information for follow-up providers     [facility]. Schedule an appointment as soon as possible for a visit in 1 month(s).   Specialty: Neurology Why: stroke clinic Contact information: [address] [place] [address] [phone]             Contact information for after-discharge care     Destination     [facility] Preferred SNF .   Service: Skilled Nursing Contact information: [address] [place] [address] [phone]                    Discharge Exam: Filed Weights   08/12/22 0447 08/13/22 0500 08/14/22 0500  Weight: 88.2 kg 90.5 kg 90.4 kg   General exam: Appears calm and comfortable   Respiratory system: Clear to auscultation. Respiratory effort normal. Cardiovascular system: S1 & S2 heard, RRR.  Gastrointestinal system: Abdomen is nondistended, soft and nontender. S/p PEG  Central nervous system: Alert , FOLLOWING simple commands.  Extremities: Symmetric 5 x 5 power. Skin: No rashes, lesions or ulcers Psychiatry: unable to assess.    Condition at discharge: fair  The results of significant diagnostics from this hospitalization (including imaging, microbiology, ancillary and laboratory) are listed below for reference.   Imaging Studies: DG CHEST PORT 1 VIEW  Result Date: 08/12/2022 CLINICAL DATA:  Fever EXAM: PORTABLE CHEST 1 VIEW COMPARISON:  07/29/2022; 07/26/2018 FINDINGS: Grossly unchanged cardiac silhouette and mediastinal contours. Interval removal of left upper extremity approach PICC line. Otherwise, stable positioning of remaining support apparatus. Minimal left basilar heterogeneous opacities. The right lung remains well aerated. No pleural effusion or pneumothorax. No evidence  of edema. No acute osseous abnormalities. IMPRESSION: Left basilar opacities, atelectasis versus infiltrate/aspiration. Continued attention on follow-up is advised. Electronically Signed   By: Simonne Come M.D.   On: 08/12/2022 10:47   IR GASTROSTOMY TUBE MOD SED  Result Date: 08/12/2022 INDICATION: History of stroke, now with dysphagia. Please perform percutaneous gastrostomy tube placement enteric nutrition supplementation purposes. EXAM: PULL TROUGH GASTROSTOMY TUBE PLACEMENT COMPARISON:  CT abdomen and pelvis-08/09/2022 MEDICATIONS: The patient is currently the hospital receiving intravenous antibiotics. The antibiotics were administered within an appropriate time frame prior to the initiation of the procedure. Glucagon 1 mg IV CONTRAST:  10 cc of Omnipaque 300 administered into the gastric lumen. ANESTHESIA/SEDATION: Moderate (conscious) sedation was employed during this procedure as  administered by the Interventional Radiology RN. A total of Versed 1 mg and Fentanyl 25 mcg was administered intravenously. Moderate Sedation Time: 10 minutes. The patient's level of consciousness and vital signs were monitored continuously by radiology nursing throughout the procedure under my direct supervision. FLUOROSCOPY TIME:  1 minute, 36 seconds (17 mGy) COMPLICATIONS: None immediate. PROCEDURE: Informed written consent was obtained from the patient's family following explanation of the procedure, risks, benefits and alternatives. A time out was performed prior to the initiation of the procedure. Ultrasound scanning was performed to demarcate the edge of the left lobe of the liver. Maximal barrier sterile technique utilized including caps, mask, sterile gowns, sterile gloves, large sterile drape, hand hygiene and Betadine prep. The left upper quadrant was sterilely prepped and draped. An oral gastric catheter was inserted into the stomach under fluoroscopy. The existing nasogastric feeding tube was removed. The left costal margin and air opacified transverse colon were identified and avoided. Air was injected into the stomach for insufflation and visualization under fluoroscopy. Under sterile conditions a 17 gauge trocar needle was utilized to access the stomach percutaneously beneath the left subcostal margin after the overlying soft tissues were anesthetized with 1% Lidocaine with epinephrine. Needle position was confirmed within the stomach with aspiration of air and injection of small amount of contrast. A single T tack was deployed for gastropexy. Over an Amplatz guide wire, a 9-French sheath was inserted into the stomach. A snare device was utilized to capture the oral gastric catheter. The snare device was pulled retrograde from the stomach up the esophagus and out the oropharynx. The 20-French pull-through gastrostomy was connected to the snare device and pulled antegrade through the oropharynx down  the esophagus into the stomach and then through the percutaneous tract external to the patient. The gastrostomy was assembled externally. Contrast injection confirms appropriate positioning within the stomach. Several spot radiographic images were obtained in various obliquities for documentation. Dressings were applied. The patient tolerated procedure well without immediate post procedural complication. FINDINGS: After successful fluoroscopic guided placement, the gastrostomy tube is appropriately positioned with internal disc positioned against the inner ventral wall of the gastric lumen. IMPRESSION: Successful fluoroscopic insertion of a 20-French pull-through gastrostomy tube. The gastrostomy may be used immediately for medication administration and in 4 hrs for the initiation of feeds. Electronically Signed   By: Simonne Come M.D.   On: 08/12/2022 10:46   CT ABDOMEN WO CONTRAST  Result Date: 08/10/2022 CLINICAL DATA:  Hemorrhagic stroke, aphasia, dysphagia and workup for percutaneous gastrostomy tube for nutritional needs. EXAM: CT ABDOMEN WITHOUT CONTRAST TECHNIQUE: Multidetector CT imaging of the abdomen was performed following the standard protocol without IV contrast. RADIATION DOSE REDUCTION: This exam was performed according to the departmental dose-optimization program which includes automated exposure  control, adjustment of the mA and/or kV according to patient size and/or use of iterative reconstruction technique. COMPARISON:  05/29/2010 FINDINGS: Lower chest: No acute abnormality. Hepatobiliary: No focal liver abnormality is seen. No gallstones, gallbladder wall thickening, or biliary dilatation. Pancreas: Unremarkable. No pancreatic ductal dilatation or surrounding inflammatory changes. Spleen: Normal in size without focal abnormality. Adrenals/Urinary Tract: No adrenal masses. Superior parapelvic cyst of the left kidney measures 4 cm and has simple characteristics consistent with a Bosniak 1  cyst. This requires no follow-up. No hydronephrosis bilaterally. Stomach/Bowel: No hiatal hernia. A feeding tube is present that extends into the stomach and terminates in the read the pylorus/duodenal bulb. Gastric anatomy is normal with no interposition of colon between the stomach and abdominal wall. No bowel obstruction, ileus, free intraperitoneal air or inflammatory process identified. No visible focal bowel lesion in the abdomen. Vascular/Lymphatic: No significant vascular findings are present. No enlarged abdominal lymph nodes. Other: No abdominal wall hernia. No ascites, focal fluid collections or body wall anasarca. Musculoskeletal: No acute or significant osseous findings. IMPRESSION: 1. Normal gastric anatomy with no interposition of colon between the stomach and abdominal wall. There is no anatomic contraindication to attempted percutaneous gastrostomy tube placement. 2. No acute findings in the abdomen. 3. 4 cm simple cyst of the left kidney. Left Bosniak I benign renal cyst measuring 4 cm. No follow-up imaging is recommended. JACR 2018 Feb; 264-273, Management of the Incidental Renal Mass on CT, RadioGraphics 2021; 814-848, Bosniak Classification of Cystic Renal Masses, Version 2019. Electronically Signed   By: Irish Lack M.D.   On: 08/10/2022 09:08   US Abdomen Limited RUQ (LIVER/GB)  Result Date: 08/04/2022 CLINICAL DATA:  Abnormal LFTs. EXAM: ULTRASOUND ABDOMEN LIMITED RIGHT UPPER QUADRANT COMPARISON:  Ultrasound dated 05/24/2008. FINDINGS: Gallbladder: No gallstones or wall thickening visualized. No sonographic Murphy sign noted by sonographer. Common bile duct: Diameter: 3 mm Liver: There is diffuse increased liver echogenicity most commonly seen in the setting of fatty infiltration. Superimposed inflammation or fibrosis is not excluded. Clinical correlation is recommended. Portal vein is patent on color Doppler imaging with normal direction of blood flow towards the liver. Other: None.  IMPRESSION: Fatty liver, otherwise unremarkable right upper quadrant ultrasound. Electronically Signed   By: Elgie Collard M.D.   On: 08/04/2022 23:41   DG Swallowing Func-Speech Pathology  Result Date: 08/03/2022 Table formatting from the original result was not included. Modified Barium Swallow Study Patient Details Name: Inis Granado Osley MRN: [id number] Date of Birth: 1960-04-29 Today's Date: 08/03/2022 HPI/PMH: HPI: Pt is a 62 y.o. female who presented with difficulty speaking. MRI brain 5/11: Large area of hemorrhage (approximately 18 mL) in the left frontal lobe, within the anterior cerebral artery territory. Pt passed [facility] 5/11, but then had neuro changes and could not complete a repeat [facility] due to lethargy. PMH: sickle cell trait, sigmoid diverticulosis, asthma, OA; BSE completed on 07/26/22 with NPO recommendation. ST f/u for SLE and po readiness. Clinical Impression: Clinical Impression: Pt presents with oral phase dysphagia characterized by reduced labial seal, oral holding, impaired mastication, and reduced bolus cohesion. She demonstrated anterior spillage, prolonged and disorganized mastication, oral residue, and premature spillage to the valleculae. No instances of penetration/aspiration were noted and pharyngeal clearance was WNL. A dysphagia 1 diet with thin liquids is recommended at this time. SLP will continue to follow for dysphagia treatment and prognosis for further diet advancement is judged to be good. Factors that may increase risk of adverse event in presence of aspiration Rubye Oaks &  Clearance Coots 2021): Factors that may increase risk of adverse event in presence of aspiration Rubye Oaks & Clearance Coots 2021): Presence of tubes (ETT, trach, NG, etc.); Dependence for feeding and/or oral hygiene Recommendations/Plan: Swallowing Evaluation Recommendations Swallowing Evaluation Recommendations Recommendations: PO diet PO Diet Recommendation: Dysphagia 1 (Pureed); Thin liquids (Level 0) Liquid  Administration via: Cup; Straw Medication Administration: Crushed with puree (or via Cortrak) Supervision: Full supervision/cueing for swallowing strategies Swallowing strategies  : Follow solids with liquids; Small bites/sips Postural changes: Position pt fully upright for meals Oral care recommendations: Oral care BID (2x/day) Treatment Plan Treatment Plan Treatment recommendations: Therapy as outlined in treatment plan below Follow-up recommendations: Acute inpatient rehab (3 hours/day) Functional status assessment: Patient has had a recent decline in their functional status and demonstrates the ability to make significant improvements in function in a reasonable and predictable amount of time. Treatment frequency: Min 2x/week Treatment duration: 2 weeks Interventions: Trials of upgraded texture/liquids; Diet toleration management by SLP Recommendations Recommendations for follow up therapy are one component of a multi-disciplinary discharge planning process, led by the attending physician.  Recommendations may be updated based on patient status, additional functional criteria and insurance authorization. Assessment: Orofacial Exam: Orofacial Exam Oral Cavity: Oral Hygiene: WFL Oral Cavity - Dentition: Adequate natural dentition Anatomy: Anatomy: WFL Boluses Administered: Boluses Administered Boluses Administered: Thin liquids (Level 0); Mildly thick liquids (Level 2, nectar thick); Moderately thick liquids (Level 3, honey thick); Puree; Solid  Oral Impairment Domain: Oral Impairment Domain Lip Closure: Escape beyond mid-chin Tongue control during bolus hold: Escape to lateral buccal cavity/floor of mouth; Posterior escape of less than half of bolus Bolus preparation/mastication: Disorganized chewing/mashing with solid pieces of bolus unchewed  Pharyngeal Impairment Domain: Pharyngeal Impairment Domain Soft palate elevation: No bolus between soft palate (SP)/pharyngeal wall (PW) Laryngeal elevation: Complete  superior movement of thyroid cartilage with complete approximation of arytenoids to epiglottic petiole Anterior hyoid excursion: Complete anterior movement Epiglottic movement: Complete inversion Laryngeal vestibule closure: Complete, no air/contrast in laryngeal vestibule Pharyngeal stripping wave : Present - complete Pharyngeal contraction (A/P view only): N/A Pharyngoesophageal segment opening: Complete distension and complete duration, no obstruction of flow Tongue base retraction: Trace column of contrast or air between tongue base and PPW Pharyngeal residue: Trace residue within or on pharyngeal structures Location of pharyngeal residue: Valleculae; Pyriform sinuses  Esophageal Impairment Domain: Esophageal Impairment Domain Esophageal clearance upright position: Complete clearance, esophageal coating Pill: Esophageal Impairment Domain Esophageal clearance upright position: Complete clearance, esophageal coating Penetration/Aspiration Scale Score: Penetration/Aspiration Scale Score 1.  Material does not enter airway: Thin liquids (Level 0); Mildly thick liquids (Level 2, nectar thick); Moderately thick liquids (Level 3, honey thick); Puree; Solid; Pill Compensatory Strategies: Compensatory Strategies Compensatory strategies: No   General Information: Caregiver present: No  Diet Prior to this Study: NPO; Cortrak/Small bore NG tube   Temperature : Normal   Respiratory Status: WFL   Supplemental O2: None (Room air)   History of Recent Intubation: No  Behavior/Cognition: Alert; Cooperative; Doesn't follow directions; Requires cueing Self-Feeding Abilities: Able to self-feed Baseline vocal quality/speech: Not observed Volitional Cough: Unable to elicit Volitional Swallow: Able to elicit Exam Limitations: -- (Difficulty following commands) Goal Planning: Prognosis for improved oropharyngeal function: Good Barriers to Reach Goals: Language deficits No data recorded Patient/Family Stated Goal: none stated No data  recorded Pain: Pain Assessment Pain Assessment: No/denies pain Faces Pain Scale: 0 Breathing: 0 Negative Vocalization: 0 Facial Expression: 0 Body Language: 0 Consolability: 0 PAINAD Score: 0 Facial Expression: 0 Body Movements:  0 Muscle Tension: 0 Compliance with ventilator (intubated pts.): N/A Vocalization (extubated pts.): N/A CPOT Total: 0 Pain Location: grimacing with noxious stimuli/movement of cortrak, redness at L eye Pain Descriptors / Indicators: Discomfort; Grimacing; Crying Pain Intervention(s): Monitored during session End of Session: Start Time:SLP Start Time (ACUTE ONLY): 1340 Stop Time: SLP Stop Time (ACUTE ONLY): 1358 Time Calculation:SLP Time Calculation (min) (ACUTE ONLY): 18 min Charges: SLP Evaluations $ SLP Speech Visit: 1 Visit SLP Evaluations $BSS Swallow: 1 Procedure $MBS Swallow: 1 Procedure $ SLP EVAL LANGUAGE/SOUND PRODUCTION: 1 Procedure $Swallowing Treatment: 1 Procedure SLP visit diagnosis: SLP Visit Diagnosis: Dysphagia, oral phase (R13.11) Past Medical History: Past Medical History: Diagnosis Date  Allergy   Anemia   Arthritis   Asthma   allergies induced  Bilateral lower extremity edema   Depression   Fibroids 08/2002  Gross hematuria 05/2010  History of appendicitis   History of colon polyps 2017  Multiple renal cysts 05/24/2008  Two left renal parapelvic cysts.  OA (osteoarthritis) of knee   Right  Obesity   Plantar fasciitis   Postmenopausal   Pre-diabetes   Renal papillary necrosis (HCC)   Seasonal allergies   Sickle cell anemia (HCC)   sickle cell trait  Sickle cell trait (HCC)   Sigmoid diverticulosis 09/26/2015  Noted on colonoscopy Past Surgical History: Past Surgical History: Procedure Laterality Date  ABDOMINAL HYSTERECTOMY  2004  APPENDECTOMY  2006  CESAREAN SECTION  1981, 1987  x2  COLONOSCOPY    COLONOSCOPY W/ POLYPECTOMY  09/26/2015  POLYPECTOMY    TOTAL KNEE ARTHROPLASTY Right 07/29/2018  Procedure: RIGHT TOTAL KNEE ARTHROPLASTY;  Surgeon: Tarry Kos, MD;   Location: [facility];  Service: Orthopedics;  Laterality: Right; Shanika I. Vear Clock, MS, CCC-SLP Neuro Diagnostic Specialist Acute Rehabilitation Services Office number: [phone] Scheryl Marten 08/03/2022, 3:16 PM  DG CHEST PORT 1 VIEW  Result Date: 07/29/2022 CLINICAL DATA:  New onset fevers. EXAM: PORTABLE CHEST 1 VIEW COMPARISON:  07/26/18 FINDINGS: There is a left arm PICC line with tip at the superior cavoatrial junction. Feeding tube courses below the level of the GE junction. Heart size appears normal. No pleural fluid or airspace disease. The visualized osseous structures are unremarkable. IMPRESSION: 1. No acute cardiopulmonary abnormalities. Electronically Signed   By: Signa Kell M.D.   On: 07/29/2022 13:58   DG Abd Portable 1V  Result Date: 07/27/2022 CLINICAL DATA:  Encounter for feeding tube placement. EXAM: PORTABLE ABDOMEN - 1 VIEW COMPARISON:  None. FINDINGS: Feeding tube tip projects over the distal stomach. The bowel gas pattern is normal. No radio-opaque calculi or other significant radiographic abnormality are seen. IMPRESSION: Feeding tube tip projects over the distal stomach. Electronically Signed   By: Orvan Falconer M.D.   On: 07/27/2022 14:56   ECHOCARDIOGRAM COMPLETE  Result Date: 07/27/2022    ECHOCARDIOGRAM REPORT   Patient Name:   NELWYN AUSTELL Holton Date of Exam: 07/27/2022 Medical Rec #:  [id number]        Height:       66.0 in Accession #:    [id number]       Weight:       192.2 lb Date of Birth:  1960/04/05       BSA:          1.966 m Patient Age:    61 years         BP:           133/74 mmHg Patient Gender: F  HR:           79 bpm. Exam Location:  Inpatient Procedure: 2D Echo, Cardiac Doppler and Color Doppler Indications:    Stroke  History:        Patient has no prior history of Echocardiogram examinations.                 Sickel Cell Trait.  Sonographer:    Raeford Razor Referring Phys: [id number] [facility] IMPRESSIONS  1. Left ventricular ejection  fraction, by estimation, is 55 to 60%. The left ventricle has normal function. The left ventricle has no regional wall motion abnormalities. Left ventricular diastolic parameters are consistent with Grade I diastolic dysfunction (impaired relaxation).  2. Right ventricular systolic function is normal. The right ventricular size is normal.  3. The mitral valve is normal in structure. No evidence of mitral valve regurgitation. No evidence of mitral stenosis.  4. The aortic valve is tricuspid. Aortic valve regurgitation is not visualized. No aortic stenosis is present.  5. The inferior vena cava is normal in size with greater than 50% respiratory variability, suggesting right atrial pressure of 3 mmHg. Comparison(s): No prior Echocardiogram. FINDINGS  Left Ventricle: Left ventricular ejection fraction, by estimation, is 55 to 60%. The left ventricle has normal function. The left ventricle has no regional wall motion abnormalities. The left ventricular internal cavity size was normal in size. There is  no left ventricular hypertrophy. Left ventricular diastolic parameters are consistent with Grade I diastolic dysfunction (impaired relaxation). Right Ventricle: The right ventricular size is normal. Right ventricular systolic function is normal. Left Atrium: Left atrial size was normal in size. Right Atrium: Right atrial size was normal in size. Pericardium: There is no evidence of pericardial effusion. Mitral Valve: The mitral valve is normal in structure. No evidence of mitral valve regurgitation. No evidence of mitral valve stenosis. Tricuspid Valve: The tricuspid valve is normal in structure. Tricuspid valve regurgitation is trivial. No evidence of tricuspid stenosis. Aortic Valve: The aortic valve is tricuspid. Aortic valve regurgitation is not visualized. No aortic stenosis is present. Aortic valve mean gradient measures 5.0 mmHg. Aortic valve peak gradient measures 9.7 mmHg. Aortic valve area, by VTI measures 2.48  cm. Pulmonic Valve: The pulmonic valve was not well visualized. Pulmonic valve regurgitation is not visualized. No evidence of pulmonic stenosis. Aorta: The aortic root is normal in size and structure. Venous: The inferior vena cava is normal in size with greater than 50% respiratory variability, suggesting right atrial pressure of 3 mmHg. IAS/Shunts: No atrial level shunt detected by color flow Doppler.  LEFT VENTRICLE PLAX 2D LVIDd:         3.90 cm   Diastology LVIDs:         3.00 cm   LV e' medial:    6.96 cm/s LV PW:         0.90 cm   LV E/e' medial:  12.8 LV IVS:        0.60 cm   LV e' lateral:   11.30 cm/s LVOT diam:     2.10 cm   LV E/e' lateral: 7.9 LV SV:         62 LV SV Index:   31 LVOT Area:     3.46 cm  RIGHT VENTRICLE             IVC RV Basal diam:  2.00 cm     IVC diam: 1.00 cm RV S prime:     20.10 cm/s TAPSE (M-mode):  2.5 cm LEFT ATRIUM             Index        RIGHT ATRIUM           Index LA diam:        3.20 cm 1.63 cm/m   RA Area:     11.40 cm LA Vol (A2C):   27.1 ml 13.78 ml/m  RA Volume:   24.10 ml  12.26 ml/m LA Vol (A4C):   40.0 ml 20.34 ml/m LA Biplane Vol: 36.8 ml 18.71 ml/m  AORTIC VALVE AV Area (Vmax):    2.53 cm AV Area (Vmean):   2.38 cm AV Area (VTI):     2.48 cm AV Vmax:           156.00 cm/s AV Vmean:          108.000 cm/s AV VTI:            0.249 m AV Peak Grad:      9.7 mmHg AV Mean Grad:      5.0 mmHg LVOT Vmax:         114.00 cm/s LVOT Vmean:        74.100 cm/s LVOT VTI:          0.178 m LVOT/AV VTI ratio: 0.71  AORTA Ao Root diam: 3.20 cm Ao Asc diam:  3.20 cm MITRAL VALVE                TRICUSPID VALVE MV Area (PHT): 5.79 cm     TR Peak grad:   23.6 mmHg MV E velocity: 88.80 cm/s   TR Vmax:        243.00 cm/s MV A velocity: 130.00 cm/s MV E/A ratio:  0.68         SHUNTS                             Systemic VTI:  0.18 m                             Systemic Diam: 2.10 cm Olga Millers MD Electronically signed by Olga Millers MD Signature Date/Time:  07/27/2022/12:38:46 PM    Final    CT HEAD WO CONTRAST ( )  Result Date: 07/27/2022 CLINICAL DATA:  Hemorrhagic stroke follow-up. EXAM: CT HEAD WITHOUT CONTRAST TECHNIQUE: Contiguous axial images were obtained from the base of the skull through the vertex without intravenous contrast. RADIATION DOSE REDUCTION: This exam was performed according to the departmental dose-optimization program which includes automated exposure control, adjustment of the mA and/or kV according to patient size and/or use of iterative reconstruction technique. COMPARISON:  Two days ago FINDINGS: Brain: Regional hemorrhage in edema facet patchy hemorrhage in regional edema in the left frontal lobe is unchanged with small volume subarachnoid hemorrhage descending the inter hemispheric fissure. Close proximity to the left lateral ventricle but no intraventricular extension. No hydrocephalus. Midline shift still measures 5 mm at the level of the septum pellucidum. Vascular: No hyperdense vessel or unexpected calcification. Skull: Normal. Negative for fracture or focal lesion. Sinuses/Orbits: No acute finding. IMPRESSION: Unchanged left frontal hemorrhage and edema with mild subarachnoid extension. No new or progressive finding. Midline shift measures 5 mm. Electronically Signed   By: Tiburcio Pea M.D.   On: 07/27/2022 05:43   EEG adult  Result Date: 07/25/2022 Jefferson Fuel, MD     07/25/2022  8:18  PM Routine EEG Report Zariha Komoroski Forni is a 62 y.o. female with a history of intracranial hemorrhage and altered mental status who is undergoing an EEG to evaluate for seizures. Report: This EEG was acquired with electrodes placed according to the International 10-20 electrode system (including Fp1, Fp2, F3, F4, C3, C4, P3, P4, O1, O2, T3, T4, T5, T6, A1, A2, Fz, Cz, Pz). The following electrodes were missing or displaced: none. The occipital dominant rhythm was 7 Hz. This activity is reactive to stimulation. Drowsiness was manifested  by background fragmentation; deeper stages of sleep were not identified. There was focal slowing over the left frontal region. There were no interictal epileptiform discharges. There were no electrographic seizures identified. Photic stimulation and hyperventilation were not performed. Impression and clinical correlation: This EEG was obtained while awake and drowsy and is abnormal due to mild diffuse slowing indicative of global cerebral dysfunction and left frontal slowing over the area of intracranial hemorrhage. Epileptiform abnormalities were not seen during this recording. Bing Neighbors, MD [facility] [phone] If 7pm- 7am, please page neurology on call as listed in AMION.   CT HEAD WO CONTRAST ( )  Result Date: 07/25/2022 CLINICAL DATA:  Intracranial hemorrhage follow up EXAM: CT HEAD WITHOUT CONTRAST TECHNIQUE: Contiguous axial images were obtained from the base of the skull through the vertex without intravenous contrast. RADIATION DOSE REDUCTION: This exam was performed according to the departmental dose-optimization program which includes automated exposure control, adjustment of the mA and/or kV according to patient size and/or use of iterative reconstruction technique. COMPARISON:  07/25/2022 at 9:04 a.m. FINDINGS: Brain: Compared to the most recent CT, the large mixed intra-axial and extra-axial hematoma at the left frontal lobe is unchanged. There is persistent rightward midline shift that measures 5 mm. Vascular: No hyperdense vessel or unexpected calcification. Skull: Normal. Negative for fracture or focal lesion. Sinuses/Orbits: No acute finding. Other: None. IMPRESSION: Unchanged large mixed intra- and extra-axial hematoma at the left frontal lobe with persistent rightward midline shift that measures 5 mm. Electronically Signed   By: Deatra Robinson M.D.   On: 07/25/2022 20:10   [place] EKG SITE RITE  Result Date: 07/25/2022 If Site Rite image not attached, placement could not  be confirmed due to current cardiac rhythm.  CT HEAD WO CONTRAST  Result Date: 07/25/2022 CLINICAL DATA:  Stroke follow-up EXAM: CT HEAD WITHOUT CONTRAST TECHNIQUE: Contiguous axial images were obtained from the base of the skull through the vertex without intravenous contrast. RADIATION DOSE REDUCTION: This exam was performed according to the departmental dose-optimization program which includes automated exposure control, adjustment of the mA and/or kV according to patient size and/or use of iterative reconstruction technique. COMPARISON:  CT and CTA from earlier today FINDINGS: Brain: Superior left frontal/parasagittal hematoma with progression in the adjacent frontal white matter. Hemorrhage is seen in both the parenchymal and subarachnoid compartments with mild brain edema. The area of newly affected brain had a normal appearance on the prior brain MRI. The CTV adn MRI are again reviewed and the superior sagittal sinus is patent and there is no detectable cortical vein thrombus. The new area of hemorrhage measures 3.4 x 1.8 x 1.7 cm. Rightward midline shift measures 5 mm. No hydrocephalus. Vascular: No hyperdense vessel or unexpected calcification. Skull: Normal. Negative for fracture or focal lesion. Sinuses/Orbits: No acute finding. Dr. Roda Shutters is aware of the findings at time of signing. IMPRESSION: Progression of left frontal ICH with new hematoma lateral to the presenting hematoma in the frontal lobe. Midline  shift now measures 5 mm. Electronically Signed   By: Tiburcio Pea M.D.   On: 07/25/2022 09:37   CT VENOGRAM HEAD  Result Date: 07/25/2022 CLINICAL DATA:  ICH EXAM: CT VENOGRAM HEAD TECHNIQUE: Venographic phase images of the brain were obtained following the administration of intravenous contrast. Multiplanar reformats and maximum intensity projections were generated. RADIATION DOSE REDUCTION: This exam was performed according to the departmental dose-optimization program which includes automated  exposure control, adjustment of the mA and/or kV according to patient size and/or use of iterative reconstruction technique. CONTRAST:  75mL OMNIPAQUE IOHEXOL 350 MG/ML SOLN COMPARISON:  Head CT and brain MRI from earlier today FINDINGS: No dural venous sinus thrombosis. The superior sagittal sinus is diffusely patent, including adjacent to the known left frontal hemorrhage. Deep and cortical veins also show no signs of thrombosis. The left transverse sigmoid outflow is dominant without stricture or other filling defect seen. No masslike enhancement at the hematoma. Follow-up after hemorrhage resolution may be useful in detecting compressed vascular lesion. IMPRESSION: Negative for dural venous sinus thrombosis. No vascular lesion seen underlying the hemorrhage. Electronically Signed   By: Tiburcio Pea M.D.   On: 07/25/2022 05:10   CT ANGIO HEAD NECK W WO CM  Result Date: 07/25/2022 CLINICAL DATA:  Hemorrhagic stroke. EXAM: CT ANGIOGRAPHY HEAD AND NECK WITH AND WITHOUT CONTRAST TECHNIQUE: Multidetector CT imaging of the head and neck was performed using the standard protocol during bolus administration of intravenous contrast. Multiplanar CT image reconstructions and MIPs were obtained to evaluate the vascular anatomy. Carotid stenosis measurements (when applicable) are obtained utilizing NASCET criteria, using the distal internal carotid diameter as the denominator. RADIATION DOSE REDUCTION: This exam was performed according to the departmental dose-optimization program which includes automated exposure control, adjustment of the mA and/or kV according to patient size and/or use of iterative reconstruction technique. CONTRAST:  75mL OMNIPAQUE IOHEXOL 350 MG/ML SOLN COMPARISON:  Head CT from earlier today FINDINGS: CTA NECK FINDINGS Aortic arch: 2 vessel branching. Right carotid system: Vessels are smoothly contoured and diffusely patent Left carotid system: Vessels are smoothly contoured and diffusely patent  Vertebral arteries: No vertebral or subclavian stenosis or ulceration. Skeleton: Generalized cervical spine degeneration. Other neck: Negative Upper chest: Negative Review of the MIP images confirms the above findings CTA HEAD FINDINGS Anterior circulation: No spot sign, aneurysm, or signs of vascular malformation underlying the acute hemorrhage. Major vessels are widely patent and smoothly contoured. Posterior circulation: No significant stenosis, proximal occlusion, aneurysm, or vascular malformation. Venous sinuses: Separate CT venogram. No dural venous sinus thrombosis or early enhancement. Anatomic variants: None significant Review of the MIP images confirms the above findings IMPRESSION: No vascular lesion or spot sign seen at the left frontal hemorrhage. Electronically Signed   By: Tiburcio Pea M.D.   On: 07/25/2022 05:08   CT Head Wo Contrast  Result Date: 07/25/2022 CLINICAL DATA:  Intracranial hemorrhage EXAM: CT HEAD WITHOUT CONTRAST TECHNIQUE: Contiguous axial images were obtained from the base of the skull through the vertex without intravenous contrast. RADIATION DOSE REDUCTION: This exam was performed according to the departmental dose-optimization program which includes automated exposure control, adjustment of the mA and/or kV according to patient size and/or use of iterative reconstruction technique. COMPARISON:  None Available. FINDINGS: Brain: Large acute intraparenchymal hematoma of the anterior superior left frontal lobe with overlying subarachnoid blood. Volume is approximately 18 mL. The hematoma is located within the left ACA territory. No midline shift or other mass effect. Vascular: No abnormal hyperdensity  of the major intracranial arteries or dural venous sinuses. No intracranial atherosclerosis. Skull: The visualized skull base, calvarium and extracranial soft tissues are normal. Sinuses/Orbits: No fluid levels or advanced mucosal thickening of the visualized paranasal sinuses.  No mastoid or middle ear effusion. The orbits are normal. IMPRESSION: 1. Large acute intraparenchymal hematoma of the anterior superior left frontal lobe with overlying subarachnoid blood. Volume is approximately 18 mL. 2. No midline shift or other mass effect. Electronically Signed   By: Deatra Robinson M.D.   On: 07/25/2022 03:29   MR BRAIN WO CONTRAST  Result Date: 07/25/2022 CLINICAL DATA:  Altered mental status EXAM: MRI HEAD WITHOUT CONTRAST TECHNIQUE: Multiplanar, multiecho pulse sequences of the brain and surrounding structures were obtained without intravenous contrast. COMPARISON:  None Available. FINDINGS: Brain: There is a large area of hemorrhage (approximately 7.0 x 2.0 x 2.5 cm) in the left frontal lobe, within the anterior cerebral artery territory. Mild surrounding edema. No visible acute ischemia, though susceptibility effects from the left frontal blood cause artifacts on the diffusion-weighted imaging. Normal white matter signal, parenchymal volume and CSF spaces. The midline structures are normal. Vascular: Major flow voids are preserved. Skull and upper cervical spine: Normal calvarium and skull base. Visualized upper cervical spine and soft tissues are normal. Sinuses/Orbits:No paranasal sinus fluid levels or advanced mucosal thickening. No mastoid or middle ear effusion. Normal orbits. IMPRESSION: Large area of hemorrhage (approximately 18 mL) in the left frontal lobe, within the anterior cerebral artery territory. The distribution raises suspicion for hemorrhagic conversion and ACA territory infarct. Head CT is recommended. Critical Value/emergent results were called by telephone at the time of interpretation on 07/25/2022 at 2:47 am to Dr. Preston Fleeting, who verbally acknowledged these results. Electronically Signed   By: Deatra Robinson M.D.   On: 07/25/2022 02:47    Microbiology: Results for orders placed or performed during the hospital encounter of 07/24/22  MRSA Next Gen by PCR, Nasal      Status: None   Collection Time: 07/25/22  5:46 AM   Specimen: Nasal Mucosa; Nasal Swab  Result Value Ref Range Status   MRSA by PCR Next Gen NOT DETECTED NOT DETECTED Final    Comment: (NOTE) The GeneXpert MRSA Assay (FDA approved for NASAL specimens only), is one component of a comprehensive MRSA colonization surveillance program. It is not intended to diagnose MRSA infection nor to guide or monitor treatment for MRSA infections. Test performance is not FDA approved in patients less than 69 years old. Performed at [facility], [address] [place], [place] [address]   Culture, blood (Routine X 2) w Reflex to ID Panel     Status: None   Collection Time: 07/29/22 12:04 PM   Specimen: BLOOD RIGHT HAND  Result Value Ref Range Status   Specimen Description BLOOD RIGHT HAND  Final   Special Requests   Final    BOTTLES DRAWN AEROBIC AND ANAEROBIC Blood Culture adequate volume   Culture   Final    NO GROWTH 5 DAYS Performed at [facility], [address] [place], [place] [address]    Report Status 08/03/2022 FINAL  Final  Culture, blood (Routine X 2) w Reflex to ID Panel     Status: None   Collection Time: 07/29/22 12:04 PM   Specimen: BLOOD RIGHT ARM  Result Value Ref Range Status   Specimen Description BLOOD RIGHT ARM  Final   Special Requests   Final    BOTTLES DRAWN AEROBIC AND  ANAEROBIC Blood Culture results may not be optimal due to an inadequate volume of blood received in culture bottles   Culture   Final    NO GROWTH 5 DAYS Performed at [facility], [address] [place], [place] [address]    Report Status 08/03/2022 FINAL  Final  Urine Culture (for pregnant, neutropenic or urologic patients or patients with an indwelling urinary catheter)     Status: Abnormal   Collection Time: 07/29/22  6:08 PM   Specimen: Urine, Clean Catch  Result Value Ref Range Status   Specimen Description URINE, CLEAN CATCH  Final   Special Requests   Final     NONE Performed at [facility], [address] [place], [place] [address]    Culture (A)  Final    >=100,000 COLONIES/mL ESCHERICHIA COLI 20,000 COLONIES/mL KLEBSIELLA PNEUMONIAE    Report Status 08/02/2022 FINAL  Final   Organism ID, Bacteria ESCHERICHIA COLI (A)  Final   Organism ID, Bacteria KLEBSIELLA PNEUMONIAE (A)  Final      Susceptibility   Escherichia coli - MIC*    AMPICILLIN >=32 RESISTANT Resistant     CEFAZOLIN <=4 SENSITIVE Sensitive     CEFEPIME <=0.12 SENSITIVE Sensitive     CEFTRIAXONE <=0.25 SENSITIVE Sensitive     CIPROFLOXACIN >=4 RESISTANT Resistant     GENTAMICIN 4 SENSITIVE Sensitive     IMIPENEM <=0.25 SENSITIVE Sensitive     NITROFURANTOIN <=16 SENSITIVE Sensitive     TRIMETH/SULFA <=20 SENSITIVE Sensitive     AMPICILLIN/SULBACTAM >=32 RESISTANT Resistant     PIP/TAZO <=4 SENSITIVE Sensitive     * >=100,000 COLONIES/mL ESCHERICHIA COLI   Klebsiella pneumoniae - MIC*    AMPICILLIN >=32 RESISTANT Resistant     CEFAZOLIN <=4 SENSITIVE Sensitive     CEFEPIME <=0.12 SENSITIVE Sensitive     CEFTRIAXONE <=0.25 SENSITIVE Sensitive     CIPROFLOXACIN <=0.25 SENSITIVE Sensitive     GENTAMICIN <=1 SENSITIVE Sensitive     IMIPENEM <=0.25 SENSITIVE Sensitive     NITROFURANTOIN 64 INTERMEDIATE Intermediate     TRIMETH/SULFA <=20 SENSITIVE Sensitive     AMPICILLIN/SULBACTAM 4 SENSITIVE Sensitive     PIP/TAZO <=4 SENSITIVE Sensitive     * 20,000 COLONIES/mL KLEBSIELLA PNEUMONIAE  Gastrointestinal Panel by PCR , Stool     Status: None   Collection Time: 07/30/22  8:34 AM   Specimen: Stool  Result Value Ref Range Status   Campylobacter species NOT DETECTED NOT DETECTED Final   Plesimonas shigelloides NOT DETECTED NOT DETECTED Final   Salmonella species NOT DETECTED NOT DETECTED Final   Yersinia enterocolitica NOT DETECTED NOT DETECTED Final   Vibrio species NOT DETECTED NOT DETECTED Final   Vibrio cholerae NOT DETECTED NOT DETECTED Final   Enteroaggregative  E coli (EAEC) NOT DETECTED NOT DETECTED Final   Enteropathogenic E coli (EPEC) NOT DETECTED NOT DETECTED Final   Enterotoxigenic E coli (ETEC) NOT DETECTED NOT DETECTED Final   Shiga like toxin producing E coli (STEC) NOT DETECTED NOT DETECTED Final   Shigella/Enteroinvasive E coli (EIEC) NOT DETECTED NOT DETECTED Final   Cryptosporidium NOT DETECTED NOT DETECTED Final   Cyclospora cayetanensis NOT DETECTED NOT DETECTED Final   Entamoeba histolytica NOT DETECTED NOT DETECTED Final   Giardia lamblia NOT DETECTED NOT DETECTED Final   Adenovirus F40/41 NOT DETECTED NOT DETECTED Final   Astrovirus NOT DETECTED NOT DETECTED Final   Norovirus GI/GII NOT DETECTED NOT DETECTED Final   Rotavirus A NOT DETECTED NOT DETECTED  Final   Sapovirus (I, II, IV, and V) NOT DETECTED NOT DETECTED Final    Comment: Performed at [facility], [address] [place], [place] [address]  Culture, blood (Routine X 2) w Reflex to ID Panel     Status: None (Preliminary result)   Collection Time: 08/12/22  9:48 AM   Specimen: BLOOD  Result Value Ref Range Status   Specimen Description BLOOD LEFT ANTECUBITAL  Final   Special Requests   Final    BOTTLES DRAWN AEROBIC AND ANAEROBIC Blood Culture adequate volume   Culture   Final    NO GROWTH 2 DAYS Performed at [facility], [address] [place], [place] [address]    Report Status PENDING  Incomplete  Culture, blood (Routine X 2) w Reflex to ID Panel     Status: None (Preliminary result)   Collection Time: 08/12/22  9:58 AM   Specimen: BLOOD RIGHT WRIST  Result Value Ref Range Status   Specimen Description BLOOD RIGHT WRIST  Final   Special Requests   Final    BOTTLES DRAWN AEROBIC ONLY Blood Culture adequate volume   Culture   Final    NO GROWTH 2 DAYS Performed at [facility], [address], [place], [place] [address]    Report Status PENDING  Incomplete    Labs: CBC: Recent Labs  Lab 08/07/22 1400 08/08/22 0344  08/12/22 0654 08/13/22 0908 08/14/22 0425  WBC 9.0 9.2 14.4* 12.5* 10.3  NEUTROABS 5.6  --   --  10.2* 8.0*  HGB 13.4 12.9 12.6 12.1 11.4*  HCT 40.8 40.0 38.5 37.3 34.7*  MCV 85.7 86.4 85.7 87.4 87.4  PLT 308 295 318 312 327   Basic Metabolic Panel: Recent Labs  Lab 08/07/22 1400 08/08/22 0344 08/09/22 0704 08/12/22 0654 08/14/22 0425  NA 143 142 139 139 142  K 4.2 4.2 4.2 4.3 4.2  CL 102 105 102 102 105  CO2 29 26 28 28 27   GLUCOSE 111* 144* 135* 136* 158*  BUN 22 25* 20 16 21   CREATININE 1.00 1.13* 0.92 1.17* 1.13*  CALCIUM 9.9 9.4 9.1 9.2 9.1  MG 2.6* 2.5* 2.0 2.4  --   PHOS 4.3 4.5 4.2 4.0  --    Liver Function Tests: Recent Labs  Lab 08/07/22 1400 08/08/22 0344 08/09/22 0704 08/12/22 0654  AST 94* 85* 70* 34  ALT 221* 200* 176* 101*  ALKPHOS 110 99 94 87  BILITOT 0.3 0.5 0.4 0.8  PROT 7.1 6.9 6.7 6.7  ALBUMIN 3.3* 3.3* 3.2* 2.9*   CBG: Recent Labs  Lab 08/13/22 1547 08/13/22 2010 08/13/22 2358 08/14/22 0346 08/14/22 0825  GLUCAP 205* 138* 128* 140* 128*    Discharge time spent: 36 minutes.   Signed: Kathlen Mody, MD [facility] 08/14/2022

## 2022-08-15 DIAGNOSIS — I611 Nontraumatic intracerebral hemorrhage in hemisphere, cortical: Secondary | ICD-10-CM | POA: Diagnosis not present

## 2022-08-15 DIAGNOSIS — J690 Pneumonitis due to inhalation of food and vomit: Secondary | ICD-10-CM | POA: Diagnosis not present

## 2022-08-15 LAB — GLUCOSE, CAPILLARY
Glucose-Capillary: 148 mg/dL — ABNORMAL HIGH (ref 70–99)
Glucose-Capillary: 134 mg/dL — ABNORMAL HIGH (ref 70–99)
Glucose-Capillary: 178 mg/dL — ABNORMAL HIGH (ref 70–99)
Glucose-Capillary: 132 mg/dL — ABNORMAL HIGH (ref 70–99)
Glucose-Capillary: 145 mg/dL — ABNORMAL HIGH (ref 70–99)

## 2022-08-15 LAB — CULTURE, BLOOD (ROUTINE X 2)

## 2022-08-15 MED ORDER — ACETAMINOPHEN 325 MG PO TABS
325.0000 mg | ORAL_TABLET | Freq: Once | ORAL | Status: AC
  Administered 2022-08-15: 325 mg via ORAL
  Filled 2022-08-15: qty 1

## 2022-08-15 NOTE — Progress Notes (Signed)
Call received from Kia with [facility] who reports pt's [employer] does not have any SNF benefit per [facility] rep she spoke to on Friday. Per [facility], [employer] gave them a preliminary auth but when it was time to admit, they were informed that not only were they OON with pt's policy but the policy also does not have SNF benefit. [name] children aware. Will need to f/u Monday with [employer] to confirm actual benefits.   Dellie Burns, MSW, LCSW [phone] (coverage)

## 2022-08-15 NOTE — Progress Notes (Signed)
[facility]                                                                               Anna Berry, is a 62 y.o. female, DOB - 1961-02-08, ZOX:[id number] Admit date - 07/24/2022    Outpatient Primary MD for the patient is Maury Dus, MD  LOS - 21  days    Brief summary   62 year old F with PMH of asthma, diverticulosis, sickle cell trait and osteoarthritis brought to ED by EMS as code stroke due to inability to speak, and found to have left frontal ICH with left ACA distribution with concern for hemorrhagic transformation.  Done, she had new frontal ICH lateral to the previous hematoma.  Eventually, she was transferred out of ICU to hospitalist service on 5/15.   Hospital course complicated by fever and UTI for which she is on IV ceftriaxone for total of 7 days.   Patient remains confused and aphasic.  She has had Cortrak for tube feed.  She has been upgraded to dysphagia 2 diet on 5/22.  Tube feed decreased to nocturnal but p.o. intake remains poor.  IR consulted for G-tube placement after discussion with patient's husband.  Likely discharge to SNF once G-tube placed.  Microbiology summarized: 5/11-MRSA PCR screen nonreactive 5/16 GIP nonreactive 5/15-urine culture with E. coli and Klebsiella pneumonia 5/15-blood cultures NGTD 5/29 PEG tube placed.      Assessment & Plan    Assessment and Plan:   Intracerebral hemorrhage Initial imaging showed left frontal ICH with left ACA distribution with concern for hemorrhagic transformation. Repeat CT 48 hours later showed stable left frontal hemorrhage and edema with mild subarachnoid extension at midline shift about 5 mm. CTV did not show any venous thrombosis. EEG is negative for seizures TTE is unremarkable LDL is 105, hemoglobin A1c is 6 Therapy evaluations recommending SNF placement. Currently waiting for placement.      Dysphagia Patient was on core track tube feeds and dysphagia 2 diet. IR  consulted for G-tube placement and it was placed  on 5/29. RD recommends nocturnal tube feeds and dysphagia 2 diet during the day.    Elevated blood pressure Patient required Levophed Prax in ICU currently normotensive.   Hyperlipidemia Patient was Crestor on Crestor which was held for elevated liver enzymes. Liver panel show improving liver enzymes, recheck in am .   Fever secondary to E. coli and Klebsiella pneumonia UTI Febrile to 100.5 in the last 24 hours. Septic workup sent with blood cultures, urine analysis urine culture and chest x-ray. CXR shows  left basilar atelectasis, suspicious for aspiration.  Started her on augmentin for aspiration pneumonia.  To complete 5 day course.  No fevers or chills .     Lactic acidosis Resolved.   GERD Stable    DVT prophylaxis:  heparin injection 5,000 Units Start: 08/12/22 0600 SCD's Start: 07/25/22 0359      Malnutrition Type:  Nutrition Problem: Inadequate oral intake Etiology: lethargy/confusion, inability to eat   Nutrition Interventions:  Interventions: Tube feeding, Refer to RD note for recommendations  Estimated body mass index is 31.7 kg/m as calculated from the following:   Height as of this encounter:  5\' 6"  (1.676 m).   Weight as of this encounter: 89.1 kg.  Code Status: Full code DVT Prophylaxis:  heparin injection 5,000 Units Start: 08/12/22 2200 SCD's Start: 07/25/22 0359   Level of Care: Level of care: Med-Surg Family Communication: None at bedside  Disposition Plan:     Remains inpatient appropriate: Plan for SNF in the next 1 to 2 days  Procedures:  PEG placement   Consultants:   Neurology IR.   Antimicrobials:   Anti-infectives (From admission, onward)    Start     Dose/Rate Route Frequency Ordered Stop   08/14/22 0000  amoxicillin-clavulanate (AUGMENTIN) 875-125 MG tablet        1 tablet Per Tube Every 12 hours 08/14/22 0931 08/19/22 2359   08/13/22 1000  amoxicillin-clavulanate  (AUGMENTIN) 875-125 MG per tablet 1 tablet        1 tablet Per Tube Every 12 hours 08/13/22 0837 08/18/22 0959   08/12/22 1025  ceFAZolin (ANCEF) IVPB 2g/100 mL premix        over 30 Minutes Intravenous Continuous PRN 08/12/22 1055 08/12/22 1025   08/12/22 0600  ceFAZolin (ANCEF) IVPB 2g/100 mL premix  Status:  Discontinued       Note to Pharmacy: Do not send to or give on floor. Will be given in radiology pre-procedure 5/29   2 g 200 mL/hr over 30 Minutes Intravenous  Once 08/11/22 1507 08/13/22 0857   07/31/22 1600  cefTRIAXone (ROCEPHIN) 1 g in sodium chloride 0.9 % 100 mL IVPB        1 g 200 mL/hr over 30 Minutes Intravenous Every 24 hours 07/31/22 1503 08/06/22 1737        Medications  Scheduled Meds:  amoxicillin-clavulanate  1 tablet Per Tube Q12H   sennosides  5 mL Per Tube BID   And   docusate  50 mg Per Tube BID   feeding supplement  237 mL Oral BID BM   feeding supplement (GLUCERNA 1.5 CAL)  1,000 mL Per Tube Q24H   free water  200 mL Per Tube Q4H   heparin injection (subcutaneous)  5,000 Units Subcutaneous Q8H   insulin aspart  0-9 Units Subcutaneous Q4H   mouth rinse  15 mL Mouth Rinse 4 times per day   pantoprazole (PROTONIX) IV  40 mg Intravenous QHS   polyethylene glycol  17 g Per Tube BID   sodium chloride flush  10-40 mL Intracatheter Q12H   Continuous Infusions:   PRN Meds:.albuterol, labetalol, mouth rinse, sodium chloride flush, white petrolatum    Subjective:   Anna Berry was seen and examined today.  Some abd discomfort.   Objective:   Vitals:   08/15/22 0300 08/15/22 0500 08/15/22 0736 08/15/22 1144  BP: 117/77  115/79 119/77  Pulse: 96  99 (!) 109  Resp: 16  17 19   Temp: 98.7 F (37.1 C)  98.7 F (37.1 C) 99.2 F (37.3 C)  TempSrc: Axillary  Oral Oral  SpO2: 94%  96% 94%  Weight:  89.1 kg    Height:        Intake/Output Summary (Last 24 hours) at 08/15/2022 1530 Last data filed at 08/15/2022 1300 Gross per 24 hour  Intake 590  ml  Output 1250 ml  Net -660 ml    Filed Weights   08/13/22 0500 08/14/22 0500 08/15/22 0500  Weight: 90.5 kg 90.4 kg 89.1 kg     Exam General exam: Appears calm and comfortable  Respiratory system: Clear to auscultation. Respiratory effort normal.  Cardiovascular system: S1 & S2 heard, RRR.  Gastrointestinal system: Abdomen is nondistended, soft and nontender. S/P peg TUBE.  Central nervous system: Alert , answering simple yes and NO.  Extremities: Symmetric 5 x 5 power. Skin: No rashes,  Psychiatry:Mood & affect appropriate.      Data Reviewed:  I have personally reviewed following labs and imaging studies   CBC Lab Results  Component Value Date   WBC 10.3 08/14/2022   RBC 3.97 08/14/2022   HGB 11.4 (L) 08/14/2022   HCT 34.7 (L) 08/14/2022   MCV 87.4 08/14/2022   MCH 28.7 08/14/2022   PLT 327 08/14/2022   MCHC 32.9 08/14/2022   RDW 15.6 (H) 08/14/2022   LYMPHSABS 1.4 08/14/2022   MONOABS 0.6 08/14/2022   EOSABS 0.2 08/14/2022   BASOSABS 0.0 08/14/2022     Last metabolic panel Lab Results  Component Value Date   NA 142 08/14/2022   K 4.2 08/14/2022   CL 105 08/14/2022   CO2 27 08/14/2022   BUN 21 08/14/2022   CREATININE 1.13 (H) 08/14/2022   GLUCOSE 158 (H) 08/14/2022   GFRNONAA 55 (L) 08/14/2022   GFRAA 71 07/13/2019   CALCIUM 9.1 08/14/2022   PHOS 4.0 08/12/2022   PROT 6.7 08/12/2022   ALBUMIN 2.9 (L) 08/12/2022   BILITOT 0.8 08/12/2022   ALKPHOS 87 08/12/2022   AST 34 08/12/2022   ALT 101 (H) 08/12/2022   ANIONGAP 10 08/14/2022    CBG (last 3)  Recent Labs    08/15/22 0307 08/15/22 0738 08/15/22 1148  GLUCAP 148* 134* 178*       Coagulation Profile: No results for input(s): "INR", "PROTIME" in the last 168 hours.   Radiology Studies: No results found.     Kathlen Mody M.D. [facility] 08/15/2022, 3:30 PM  Available via Epic secure chat 7am-7pm After 7 pm, please refer to night coverage provider listed on  amion.

## 2022-08-15 NOTE — Plan of Care (Signed)
?  Problem: Nutrition: ?Goal: Dietary intake will improve ?Outcome: Progressing ?  ?

## 2022-08-15 NOTE — Plan of Care (Signed)
  Problem: Education: Goal: Knowledge of disease or condition will improve Outcome: Progressing Goal: Knowledge of secondary prevention will improve (MUST DOCUMENT ALL) Outcome: Progressing Goal: Knowledge of patient specific risk factors will improve Loraine Leriche N/A or DELETE if not current risk factor) Outcome: Progressing   Problem: Intracerebral Hemorrhage Tissue Perfusion: Goal: Complications of Intracerebral Hemorrhage will be minimized Outcome: Progressing   Problem: Coping: Goal: Will verbalize positive feelings about self Outcome: Progressing Goal: Will identify appropriate support needs Outcome: Progressing   Problem: Health Behavior/Discharge Planning: Goal: Ability to manage health-related needs will improve Outcome: Progressing Goal: Goals will be collaboratively established with patient/family Outcome: Progressing   Problem: Self-Care: Goal: Ability to participate in self-care as condition permits will improve Outcome: Progressing Goal: Verbalization of feelings and concerns over difficulty with self-care will improve Outcome: Progressing Goal: Ability to communicate needs accurately will improve Outcome: Progressing   Problem: Nutrition: Goal: Risk of aspiration will decrease Outcome: Progressing Goal: Dietary intake will improve Outcome: Progressing   Problem: Education: Goal: Knowledge of General Education information will improve Description: Including pain rating scale, medication(s)/side effects and non-pharmacologic comfort measures Outcome: Progressing   Problem: Health Behavior/Discharge Planning: Goal: Ability to manage health-related needs will improve Outcome: Progressing   Problem: Clinical Measurements: Goal: Ability to maintain clinical measurements within normal limits will improve Outcome: Progressing Goal: Will remain free from infection Outcome: Progressing Goal: Diagnostic test results will improve Outcome: Progressing Goal:  Cardiovascular complication will be avoided Outcome: Progressing   Problem: Activity: Goal: Risk for activity intolerance will decrease Outcome: Progressing   Problem: Nutrition: Goal: Adequate nutrition will be maintained Outcome: Progressing   Problem: Coping: Goal: Level of anxiety will decrease Outcome: Progressing   Problem: Elimination: Goal: Will not experience complications related to bowel motility Outcome: Progressing Goal: Will not experience complications related to urinary retention Outcome: Progressing   Problem: Pain Managment: Goal: General experience of comfort will improve Outcome: Progressing   Problem: Safety: Goal: Ability to remain free from injury will improve Outcome: Progressing   Problem: Skin Integrity: Goal: Risk for impaired skin integrity will decrease Outcome: Progressing   Problem: Education: Goal: Ability to describe self-care measures that may prevent or decrease complications (Diabetes Survival Skills Education) will improve Outcome: Progressing Goal: Individualized Educational Video(s) Outcome: Progressing   Problem: Coping: Goal: Ability to adjust to condition or change in health will improve Outcome: Progressing   Problem: Fluid Volume: Goal: Ability to maintain a balanced intake and output will improve Outcome: Progressing   Problem: Health Behavior/Discharge Planning: Goal: Ability to identify and utilize available resources and services will improve Outcome: Progressing Goal: Ability to manage health-related needs will improve Outcome: Progressing   Problem: Metabolic: Goal: Ability to maintain appropriate glucose levels will improve Outcome: Progressing

## 2022-08-16 DIAGNOSIS — I611 Nontraumatic intracerebral hemorrhage in hemisphere, cortical: Secondary | ICD-10-CM | POA: Diagnosis not present

## 2022-08-16 DIAGNOSIS — J690 Pneumonitis due to inhalation of food and vomit: Secondary | ICD-10-CM | POA: Diagnosis not present

## 2022-08-16 LAB — GLUCOSE, CAPILLARY
Glucose-Capillary: 127 mg/dL — ABNORMAL HIGH (ref 70–99)
Glucose-Capillary: 183 mg/dL — ABNORMAL HIGH (ref 70–99)
Glucose-Capillary: 124 mg/dL — ABNORMAL HIGH (ref 70–99)
Glucose-Capillary: 164 mg/dL — ABNORMAL HIGH (ref 70–99)
Glucose-Capillary: 192 mg/dL — ABNORMAL HIGH (ref 70–99)

## 2022-08-16 LAB — CULTURE, BLOOD (ROUTINE X 2): Culture: NO GROWTH

## 2022-08-16 NOTE — Progress Notes (Signed)
[facility]                                                                               Anna Berry, is a 62 y.o. female, DOB - [birth year], ZHY:[id number] Admit date - 07/24/2022    Outpatient Primary MD for the patient is Maury Dus, MD  LOS - 22  days    Brief summary   62 year old F with PMH of asthma, diverticulosis, sickle cell trait and osteoarthritis brought to ED by EMS as code stroke due to inability to speak, and found to have left frontal ICH with left ACA distribution with concern for hemorrhagic transformation.  Done, she had new frontal ICH lateral to the previous hematoma.  Eventually, she was transferred out of ICU to hospitalist service on 5/15.   Hospital course complicated by fever and UTI for which she is on IV ceftriaxone for total of 7 days.   Patient remains confused and aphasic.  She has had Cortrak for tube feed.  She has been upgraded to dysphagia 2 diet on 5/22.  Tube feed decreased to nocturnal but p.o. intake remains poor.  IR consulted for G-tube placement after discussion with patient's husband.  Likely discharge to SNF once G-tube placed.  Microbiology summarized: 5/11-MRSA PCR screen nonreactive 5/16 GIP nonreactive 5/15-urine culture with E. coli and Klebsiella pneumonia 5/15-blood cultures NGTD 5/29 PEG tube placed.   some abdominal discomfort earlier this am, which has resolved.   Assessment & Plan    Assessment and Plan:   Intracerebral hemorrhage Initial imaging showed left frontal ICH with left ACA distribution with concern for hemorrhagic transformation. Repeat CT 48 hours later showed stable left frontal hemorrhage and edema with mild subarachnoid extension at midline shift about 5 mm. CTV did not show any venous thrombosis. EEG is negative for seizures TTE is unremarkable LDL is 105, hemoglobin A1c is 6 Therapy evaluations recommending SNF placement. Currently waiting for placement.      Dysphagia Patient  was on core track tube feeds and dysphagia 2 diet. IR consulted for G-tube placement and it was placed  on 5/29. RD recommends nocturnal tube feeds and dysphagia 2 diet during the day.  Some abdominal discomfort earlier this am, which has resolved.    Elevated blood pressure Patient required Levophed Prax in ICU currently normotensive. Bp Parameters are optimal.    Hyperlipidemia Patient was  on Crestor which was held for elevated liver enzymes. Repeat liver panel and if normal will restart crestor.   Fever secondary to E. coli and Klebsiella pneumonia UTI and aspiration pneumonia.  Septic workup sent with blood cultures, urine analysis urine culture and chest x-ray done. Blood cultures are negative so far.  CXR shows  left basilar atelectasis, suspicious for aspiration.  Started her on augmentin for aspiration pneumonia.  To complete 5 day course.  No fevers or chills .     Lactic acidosis Resolved.   GERD Stable    DVT prophylaxis:  heparin injection 5,000 Units Start: 08/12/22 0600 SCD's Start: 07/25/22 0359      Malnutrition Type:  Nutrition Problem: Inadequate oral intake Etiology: lethargy/confusion, inability to eat   Nutrition Interventions:  Interventions: Tube feeding,  Refer to RD note for recommendations  Estimated body mass index is 31.53 kg/m as calculated from the following:   Height as of this encounter: 5\' 6"  (1.676 m).   Weight as of this encounter: 88.6 kg.  Code Status: Full code DVT Prophylaxis:  heparin injection 5,000 Units Start: 08/12/22 2200 SCD's Start: 07/25/22 0359   Level of Care: Level of care: Med-Surg Family Communication: None at bedside  Disposition Plan:     Remains inpatient appropriate: Plan for SNF in the next 1 to 2 days  Procedures:  PEG placement   Consultants:   Neurology IR.   Antimicrobials:   Anti-infectives (From admission, onward)    Start     Dose/Rate Route Frequency Ordered Stop   08/14/22  0000  amoxicillin-clavulanate (AUGMENTIN) 875-125 MG tablet        1 tablet Per Tube Every 12 hours 08/14/22 0931 08/19/22 2359   08/13/22 1000  amoxicillin-clavulanate (AUGMENTIN) 875-125 MG per tablet 1 tablet        1 tablet Per Tube Every 12 hours 08/13/22 0837 08/18/22 0959   08/12/22 1025  ceFAZolin (ANCEF) IVPB 2g/100 mL premix        over 30 Minutes Intravenous Continuous PRN 08/12/22 1055 08/12/22 1025   08/12/22 0600  ceFAZolin (ANCEF) IVPB 2g/100 mL premix  Status:  Discontinued       Note to Pharmacy: Do not send to or give on floor. Will be given in radiology pre-procedure 5/29   2 g 200 mL/hr over 30 Minutes Intravenous  Once 08/11/22 1507 08/13/22 0857   07/31/22 1600  cefTRIAXone (ROCEPHIN) 1 g in sodium chloride 0.9 % 100 mL IVPB        1 g 200 mL/hr over 30 Minutes Intravenous Every 24 hours 07/31/22 1503 08/06/22 1737        Medications  Scheduled Meds:  amoxicillin-clavulanate  1 tablet Per Tube Q12H   sennosides  5 mL Per Tube BID   And   docusate  50 mg Per Tube BID   feeding supplement  237 mL Oral BID BM   feeding supplement (GLUCERNA 1.5 CAL)  1,000 mL Per Tube Q24H   free water  200 mL Per Tube Q4H   heparin injection (subcutaneous)  5,000 Units Subcutaneous Q8H   insulin aspart  0-9 Units Subcutaneous Q4H   mouth rinse  15 mL Mouth Rinse 4 times per day   pantoprazole (PROTONIX) IV  40 mg Intravenous QHS   polyethylene glycol  17 g Per Tube BID   sodium chloride flush  10-40 mL Intracatheter Q12H   Continuous Infusions:   PRN Meds:.albuterol, labetalol, mouth rinse, sodium chloride flush, white petrolatum    Subjective:   Anna Berry was seen and examined today.  Some abd discomfort.   Objective:   Vitals:   08/16/22 0500 08/16/22 0710 08/16/22 1207 08/16/22 1631  BP:  126/78 116/85 128/82  Pulse:  88 100 (!) 110  Resp:  16 16 20   Temp:  98.3 F (36.8 C) 98.9 F (37.2 C) 98.1 F (36.7 C)  TempSrc:  Oral Oral Oral  SpO2:   95%  96%  Weight: 88.6 kg     Height:        Intake/Output Summary (Last 24 hours) at 08/16/2022 1745 Last data filed at 08/16/2022 1600 Gross per 24 hour  Intake 9038.67 ml  Output 1400 ml  Net 7638.67 ml    Filed Weights   08/14/22 0500 08/15/22 0500 08/16/22 0500  Weight:  90.4 kg 89.1 kg 88.6 kg     Exam General exam: Appears calm and comfortable  Respiratory system: Clear to auscultation. Respiratory effort normal. Cardiovascular system: S1 & S2 heard, RRR. No JVD,  Gastrointestinal system: Abdomen is nondistended, soft and nontender.  S/p PEG Tube.  Central nervous system: Alert , mostly non verbal.  Extremities: Symmetric 5 x 5 power. Skin: No rashes, Psychiatry: Mood & affect appropriate.       Data Reviewed:  I have personally reviewed following labs and imaging studies   CBC Lab Results  Component Value Date   WBC 10.3 08/14/2022   RBC 3.97 08/14/2022   HGB 11.4 (L) 08/14/2022   HCT 34.7 (L) 08/14/2022   MCV 87.4 08/14/2022   MCH 28.7 08/14/2022   PLT 327 08/14/2022   MCHC 32.9 08/14/2022   RDW 15.6 (H) 08/14/2022   LYMPHSABS 1.4 08/14/2022   MONOABS 0.6 08/14/2022   EOSABS 0.2 08/14/2022   BASOSABS 0.0 08/14/2022     Last metabolic panel Lab Results  Component Value Date   NA 142 08/14/2022   K 4.2 08/14/2022   CL 105 08/14/2022   CO2 27 08/14/2022   BUN 21 08/14/2022   CREATININE 1.13 (H) 08/14/2022   GLUCOSE 158 (H) 08/14/2022   GFRNONAA 55 (L) 08/14/2022   GFRAA 71 07/13/2019   CALCIUM 9.1 08/14/2022   PHOS 4.0 08/12/2022   PROT 6.7 08/12/2022   ALBUMIN 2.9 (L) 08/12/2022   BILITOT 0.8 08/12/2022   ALKPHOS 87 08/12/2022   AST 34 08/12/2022   ALT 101 (H) 08/12/2022   ANIONGAP 10 08/14/2022    CBG (last 3)  Recent Labs    08/16/22 0816 08/16/22 1205 08/16/22 1640  GLUCAP 183* 124* 164*       Coagulation Profile: No results for input(s): "INR", "PROTIME" in the last 168 hours.   Radiology Studies: No results  found.     Kathlen Mody M.D. [facility] 08/16/2022, 5:45 PM  Available via Epic secure chat 7am-7pm After 7 pm, please refer to night coverage provider listed on amion.

## 2022-08-17 DIAGNOSIS — J690 Pneumonitis due to inhalation of food and vomit: Secondary | ICD-10-CM | POA: Diagnosis not present

## 2022-08-17 DIAGNOSIS — I611 Nontraumatic intracerebral hemorrhage in hemisphere, cortical: Secondary | ICD-10-CM | POA: Diagnosis not present

## 2022-08-17 LAB — GLUCOSE, CAPILLARY
Glucose-Capillary: 134 mg/dL — ABNORMAL HIGH (ref 70–99)
Glucose-Capillary: 146 mg/dL — ABNORMAL HIGH (ref 70–99)
Glucose-Capillary: 159 mg/dL — ABNORMAL HIGH (ref 70–99)
Glucose-Capillary: 162 mg/dL — ABNORMAL HIGH (ref 70–99)
Glucose-Capillary: 167 mg/dL — ABNORMAL HIGH (ref 70–99)
Glucose-Capillary: 167 mg/dL — ABNORMAL HIGH (ref 70–99)
Glucose-Capillary: 180 mg/dL — ABNORMAL HIGH (ref 70–99)
Glucose-Capillary: 181 mg/dL — ABNORMAL HIGH (ref 70–99)

## 2022-08-17 LAB — CULTURE, BLOOD (ROUTINE X 2)
Culture: NO GROWTH
Special Requests: ADEQUATE

## 2022-08-17 LAB — COMPREHENSIVE METABOLIC PANEL
ALT: 171 U/L — ABNORMAL HIGH (ref 0–44)
AST: 79 U/L — ABNORMAL HIGH (ref 15–41)
Albumin: 2.8 g/dL — ABNORMAL LOW (ref 3.5–5.0)
Alkaline Phosphatase: 120 U/L (ref 38–126)
Anion gap: 10 (ref 5–15)
BUN: 24 mg/dL — ABNORMAL HIGH (ref 8–23)
CO2: 27 mmol/L (ref 22–32)
Calcium: 9.5 mg/dL (ref 8.9–10.3)
Chloride: 104 mmol/L (ref 98–111)
Creatinine, Ser: 1 mg/dL (ref 0.44–1.00)
GFR, Estimated: >60 mL/min (ref >60)
Glucose, Bld: 170 mg/dL — ABNORMAL HIGH (ref 70–99)
Potassium: 4.7 mmol/L (ref 3.5–5.1)
Sodium: 141 mmol/L (ref 135–145)
Total Bilirubin: 0.2 mg/dL — ABNORMAL LOW (ref 0.3–1.2)
Total Protein: 7.2 g/dL (ref 6.5–8.1)

## 2022-08-17 MED ORDER — FAMOTIDINE 20 MG PO TABS
20.0000 mg | ORAL_TABLET | Freq: Every day | ORAL | Status: DC
  Administered 2022-08-17 – 2022-08-18 (×2): 20 mg
  Filled 2022-08-17 (×3): qty 1

## 2022-08-17 NOTE — Progress Notes (Signed)
[facility]                                                                               Anna Berry, is a 62 y.o. female, DOB - 04-Apr-1960, GEX:[id number] Admit date - 07/24/2022    Outpatient Primary MD for the patient is Maury Dus, MD  LOS - 23  days    Brief summary   62 year old F with PMH of asthma, diverticulosis, sickle cell trait and osteoarthritis brought to ED by EMS as code stroke due to inability to speak, and found to have left frontal ICH with left ACA distribution with concern for hemorrhagic transformation.  Done, she had new frontal ICH lateral to the previous hematoma.  Eventually, she was transferred out of ICU to hospitalist service on 5/15.   Hospital course complicated by fever and UTI for which she is on IV ceftriaxone for total of 7 days.   Patient remains confused and aphasic.  She has had Cortrak for tube feed.  She has been upgraded to dysphagia 2 diet on 5/22.  Tube feed decreased to nocturnal but p.o. intake remains poor.  IR consulted for G-tube placement after discussion with patient's husband.  Likely discharge to SNF once G-tube placed.  Microbiology summarized: 5/11-MRSA PCR screen nonreactive 5/16 GIP nonreactive 5/15-urine culture with E. coli and Klebsiella pneumonia 5/15-blood cultures NGTD 5/29 PEG tube placed.   some abdominal discomfort earlier this am, which has resolved.   Assessment & Plan    Assessment and Plan:   Intracerebral hemorrhage Initial imaging showed left frontal ICH with left ACA distribution with concern for hemorrhagic transformation. Repeat CT 48 hours later showed stable left frontal hemorrhage and edema with mild subarachnoid extension at midline shift about 5 mm. CTV did not show any venous thrombosis. EEG is negative for seizures TTE is unremarkable LDL is 105, hemoglobin A1c is 6 Therapy evaluations recommending SNF placement. Currently waiting for placement.      Dysphagia Patient  was on core track tube feeds and dysphagia 2 diet. IR consulted for G-tube placement and it was placed  on 5/29. RD recommends nocturnal tube feeds and dysphagia 2 diet during the day.  No new complaints of nausea and vomiting or abd pain.    Elevated blood pressure Patient required Levophed  in ICU currently normotensive. Bp Parameters are optimal.    Hyperlipidemia Unfortunately not on crestor due to elevated liver enzymes.   Fever secondary to E. coli and Klebsiella pneumonia UTI and aspiration pneumonia.  Septic workup sent with blood cultures, urine analysis urine culture and chest x-ray done. Blood cultures are negative so far.  CXR shows  left basilar atelectasis, suspicious for aspiration.  Started her on augmentin for aspiration pneumonia.  To complete 5 day course.  No fevers or chills .     Lactic acidosis Resolved.   GERD Stable    DVT prophylaxis:  heparin injection 5,000 Units Start: 08/12/22 0600 SCD's Start: 07/25/22 0359      Malnutrition Type:  Nutrition Problem: Inadequate oral intake Etiology: lethargy/confusion, inability to eat   Nutrition Interventions:  Interventions: Tube feeding, Refer to RD note for recommendations  Estimated body mass index  is 32.2 kg/m as calculated from the following:   Height as of this encounter: 5\' 6"  (1.676 m).   Weight as of this encounter: 90.5 kg.  Code Status: Full code DVT Prophylaxis:  heparin injection 5,000 Units Start: 08/12/22 2200 SCD's Start: 07/25/22 0359   Level of Care: Level of care: Med-Surg Family Communication: None at bedside  Disposition Plan:     Remains inpatient appropriate: Plan for SNF in the next 1 to 2 days  Procedures:  PEG placement   Consultants:   Neurology IR.   Antimicrobials:   Anti-infectives (From admission, onward)    Start     Dose/Rate Route Frequency Ordered Stop   08/14/22 0000  amoxicillin-clavulanate (AUGMENTIN) 875-125 MG tablet        1 tablet  Per Tube Every 12 hours 08/14/22 0931 08/19/22 2359   08/13/22 1000  amoxicillin-clavulanate (AUGMENTIN) 875-125 MG per tablet 1 tablet        1 tablet Per Tube Every 12 hours 08/13/22 0837 08/18/22 0959   08/12/22 1025  ceFAZolin (ANCEF) IVPB 2g/100 mL premix        over 30 Minutes Intravenous Continuous PRN 08/12/22 1055 08/12/22 1025   08/12/22 0600  ceFAZolin (ANCEF) IVPB 2g/100 mL premix  Status:  Discontinued       Note to Pharmacy: Do not send to or give on floor. Will be given in radiology pre-procedure 5/29   2 g 200 mL/hr over 30 Minutes Intravenous  Once 08/11/22 1507 08/13/22 0857   07/31/22 1600  cefTRIAXone (ROCEPHIN) 1 g in sodium chloride 0.9 % 100 mL IVPB        1 g 200 mL/hr over 30 Minutes Intravenous Every 24 hours 07/31/22 1503 08/06/22 1737        Medications  Scheduled Meds:  amoxicillin-clavulanate  1 tablet Per Tube Q12H   sennosides  5 mL Per Tube BID   And   docusate  50 mg Per Tube BID   famotidine  20 mg Per Tube Daily   feeding supplement  237 mL Oral BID BM   feeding supplement (GLUCERNA 1.5 CAL)  1,000 mL Per Tube Q24H   free water  200 mL Per Tube Q4H   heparin injection (subcutaneous)  5,000 Units Subcutaneous Q8H   insulin aspart  0-9 Units Subcutaneous Q4H   mouth rinse  15 mL Mouth Rinse 4 times per day   polyethylene glycol  17 g Per Tube BID   sodium chloride flush  10-40 mL Intracatheter Q12H   Continuous Infusions:   PRN Meds:.albuterol, labetalol, mouth rinse, sodium chloride flush, white petrolatum    Subjective:   Anna Berry was seen and examined today.  No new complaints.   Objective:   Vitals:   08/17/22 0412 08/17/22 0412 08/17/22 0824 08/17/22 1147  BP:  113/80 108/73 113/80  Pulse:  (!) 103 99 (!) 103  Resp:  15 18 19   Temp:  98.5 F (36.9 C) 98.7 F (37.1 C) 98.1 F (36.7 C)  TempSrc:  Oral Oral Oral  SpO2:  95% 95% 95%  Weight: 90.5 kg     Height:        Intake/Output Summary (Last 24 hours) at  08/17/2022 1345 Last data filed at 08/17/2022 0500 Gross per 24 hour  Intake 1668.83 ml  Output 1100 ml  Net 568.83 ml    Filed Weights   08/15/22 0500 08/16/22 0500 08/17/22 0412  Weight: 89.1 kg 88.6 kg 90.5 kg     Exam  General exam: Appears calm and comfortable  Respiratory system: Clear to auscultation. Respiratory effort normal. Cardiovascular system: S1 & S2 heard, RRR. No JVD, Gastrointestinal system: Abdomen is nondistended, soft and nontender. S/p PEG.  Central nervous system: Alert a, following simple commands.  Extremities: no pedal edema.  Skin: No rashes,  Psychiatry: calm.        Data Reviewed:  I have personally reviewed following labs and imaging studies   CBC Lab Results  Component Value Date   WBC 10.3 08/14/2022   RBC 3.97 08/14/2022   HGB 11.4 (L) 08/14/2022   HCT 34.7 (L) 08/14/2022   MCV 87.4 08/14/2022   MCH 28.7 08/14/2022   PLT 327 08/14/2022   MCHC 32.9 08/14/2022   RDW 15.6 (H) 08/14/2022   LYMPHSABS 1.4 08/14/2022   MONOABS 0.6 08/14/2022   EOSABS 0.2 08/14/2022   BASOSABS 0.0 08/14/2022     Last metabolic panel Lab Results  Component Value Date   NA 141 08/17/2022   K 4.7 08/17/2022   CL 104 08/17/2022   CO2 27 08/17/2022   BUN 24 (H) 08/17/2022   CREATININE 1.00 08/17/2022   GLUCOSE 170 (H) 08/17/2022   GFRNONAA >60 08/17/2022   GFRAA 71 07/13/2019   CALCIUM 9.5 08/17/2022   PHOS 4.0 08/12/2022   PROT 7.2 08/17/2022   ALBUMIN 2.8 (L) 08/17/2022   BILITOT 0.2 (L) 08/17/2022   ALKPHOS 120 08/17/2022   AST 79 (H) 08/17/2022   ALT 171 (H) 08/17/2022   ANIONGAP 10 08/17/2022    CBG (last 3)  Recent Labs    08/17/22 0825 08/17/22 0918 08/17/22 1148  GLUCAP 162* 181* 134*       Coagulation Profile: No results for input(s): "INR", "PROTIME" in the last 168 hours.   Radiology Studies: No results found.     Kathlen Mody M.D. [facility] 08/17/2022, 1:45 PM  Available via Epic secure chat  7am-7pm After 7 pm, please refer to night coverage provider listed on amion.

## 2022-08-17 NOTE — Progress Notes (Signed)
Physical Therapy Treatment Patient Details Name: Anna Berry MRN: [id number] DOB: [birth year] Today's Date: 08/17/2022   History of Present Illness Pt is a 62 y.o. female who presented 07/25/22 with speech difficulties. MRI showed a L frontal ICH. Worsening neuro decline noted 5/11 after admitting pt. Repeat imaging showed new area of ICH lateral to current ICH with MLS 5 mm. PEG tube placed 5/29. PMH: Sickle cell trait, sigmoid diverticulosis, asthma, OA, obesity, pre-diabetes    PT Comments    Pt received in bed, husband visiting. Pt with very large, watery BM in bed. Rolled B for cleanup as well as maintaining SL with use of rail when on R side. Pt came to EOB with mod A +2. Pt verbalizing more this week than last and was able to get out a few full sentences. Also doing better with following commands. Remains avoidant of L lean but allows self to be placed into L elbow in sitting and can maintain balance with min guard A in this position. Pt stood with max A +2 and pivoted to L with B knees blocked and strong facilitation of wt shift to L. Pt c/o abdominal discomfort when upright. PT will continue to follow.    Recommendations for follow up therapy are one component of a multi-disciplinary discharge planning process, led by the attending physician.  Recommendations may be updated based on patient status, additional functional criteria and insurance authorization.  Follow Up Recommendations  Can patient physically be transported by private vehicle: No    Assistance Recommended at Discharge Frequent or constant Supervision/Assistance  Patient can return home with the following Two people to help with walking and/or transfers;Two people to help with bathing/dressing/bathroom;Assistance with feeding;Assistance with cooking/housework;Direct supervision/assist for medications management;Direct supervision/assist for financial management;Assist for transportation;Help with stairs or ramp for  entrance   Equipment Recommendations  Hospital bed;Other (comment) (hoyer lift; tilt-in-space w/c)    Recommendations for Other Services Rehab consult     Precautions / Restrictions Precautions Precautions: Fall Precaution Comments: PEG, abdominal binder Restrictions Weight Bearing Restrictions: No     Mobility  Bed Mobility Overal bed mobility: Needs Assistance Bed Mobility: Rolling, Sidelying to Sit Rolling: Mod assist Sidelying to sit: Mod assist, +2 for safety/equipment       General bed mobility comments: Using L UE to reach for bedrails to assist with rolling. Overall Mod A with slightly more assist needed to roll to L side d/t need for RLE assistance over LLE. Able to push LLE off to EOB w/ some assist, LUE on bedrail and Mod A to lift trunk to EOB    Transfers Overall transfer level: Needs assistance Equipment used: 1 Brierley hand held assist, 2 Morimoto hand held assist Transfers: Sit to/from Stand, Bed to chair/wheelchair/BSC Sit to Stand: Max assist, +2 safety/equipment Stand pivot transfers: Max assist, +2 physical assistance, +2 safety/equipment         General transfer comment: Max A to stand at bedside w/ +2 for safety/assistance for peri care. Educated pt to reach LUE to armrest for transfer w/ pt able to recall with Max A x2 to pivot to recliner on L side; support to block R knee to prevent buckling. Pt avoidant of going to the L so took facilitation of L wt shift    Ambulation/Gait               General Gait Details: unable   Optometrist  Modified Rankin (Stroke Patients Only) Modified Rankin (Stroke Patients Only) Pre-Morbid Rankin Score: No symptoms Modified Rankin: Severe disability     Balance Overall balance assessment: Needs assistance Sitting-balance support: Feet supported, No upper extremity supported, Single extremity supported Sitting balance-Leahy Scale: Fair Sitting balance - Comments:  initial pushing with L UE that was easily redirectable with other tasks. Pt avoidant of coming down onto L elbow but allowed herself to be placed in that posiiton. With L elbow down pt able to maintain sitting with min guard A. When LUE not engaged in activity pt needs min A due to R lean Postural control: Right lateral lean Standing balance support: During functional activity, Bilateral upper extremity supported Standing balance-Leahy Scale: Poor Standing balance comment: HHA and maxA with bil knees blocked to stand                            Cognition Arousal/Alertness: Awake/alert Behavior During Therapy: Flat affect (smiling/laughing at times) Overall Cognitive Status: Difficult to assess Area of Impairment: Following commands, Attention, Safety/judgement, Awareness, Problem solving                   Current Attention Level: Sustained   Following Commands: Follows one step commands consistently, Follows one step commands with increased time Safety/Judgement: Decreased awareness of safety, Decreased awareness of deficits Awareness: Intellectual Problem Solving: Slow processing, Difficulty sequencing, Requires verbal cues, Requires tactile cues, Decreased initiation General Comments: pt following one step commands today though occasionally needs tactile cues with verbal cues.        Exercises General Exercises - Lower Extremity Ankle Circles/Pumps: PROM, Right, Seated Long Arc Quad: PROM, Right, 5 reps, Seated    General Comments General comments (skin integrity, edema, etc.): husband at bedside. Pt with veyr large watery stool upon PT/OT entry. Rolled B for cleanup      Pertinent Vitals/Pain Pain Assessment Pain Assessment: Faces Faces Pain Scale: Hurts little more Breathing: normal Negative Vocalization: none Facial Expression: smiling or inexpressive Body Language: relaxed Consolability: no need to console PAINAD Score: 0 Pain Location: abdomen Pain  Descriptors / Indicators: Grimacing, Guarding Pain Intervention(s): Monitored during session, Limited activity within patient's tolerance, Repositioned    Home Living                          Prior Function            PT Goals (current goals can now be found in the care plan section) Acute Rehab PT Goals Patient Stated Goal: to get some glasses PT Goal Formulation: Patient unable to participate in goal setting Time For Goal Achievement: 08/23/22 Potential to Achieve Goals: Fair Progress towards PT goals: Progressing toward goals    Frequency    Min 3X/week      PT Plan Current plan remains appropriate    Co-evaluation PT/OT/SLP Co-Evaluation/Treatment: Yes Reason for Co-Treatment: Complexity of the patient's impairments (multi-system involvement);Necessary to address cognition/behavior during functional activity;For patient/therapist safety;To address functional/ADL transfers PT goals addressed during session: Mobility/safety with mobility;Balance;Strengthening/ROM OT goals addressed during session: ADL's and self-care;Strengthening/ROM      AM-PAC PT "6 Clicks" Mobility   Outcome Measure  Help needed turning from your back to your side while in a flat bed without using bedrails?: A Lot Help needed moving from lying on your back to sitting on the side of a flat bed without using bedrails?: A Lot Help needed moving to  and from a bed to a chair (including a wheelchair)?: Total Help needed standing up from a chair using your arms (e.g., wheelchair or bedside chair)?: A Lot Help needed to walk in hospital room?: Total Help needed climbing 3-5 steps with a railing? : Total 6 Click Score: 9    End of Session Equipment Utilized During Treatment: Gait belt Activity Tolerance: Patient tolerated treatment well Patient left: with call bell/phone within reach;in chair;with chair alarm set;with family/visitor present Nurse Communication: Mobility status PT Visit  Diagnosis: Unsteadiness on feet (R26.81);Muscle weakness (generalized) (M62.81);Difficulty in walking, not elsewhere classified (R26.2);Other symptoms and signs involving the nervous system (R29.898);Hemiplegia and hemiparesis Hemiplegia - Right/Left: Right Hemiplegia - dominant/non-dominant: Dominant Hemiplegia - caused by: Other Nontraumatic intracranial hemorrhage     Time: 8119-1478 PT Time Calculation (min) (ACUTE ONLY): 32 min  Charges:  $Therapeutic Activity: 8-22 mins                     Lyanne Co, PT  Acute Rehab Services Secure chat preferred Office [phone]    Lawana Chambers Zakyla Tonche 08/17/2022, 1:53 PM

## 2022-08-17 NOTE — Progress Notes (Signed)
Occupational Therapy Treatment Patient Details Name: Anna Berry MRN: [id number] DOB: 05/17/1960 Today's Date: 08/17/2022   History of present illness Pt is a 62 y.o. female who presented 07/25/22 with speech difficulties. MRI showed a L frontal ICH. Worsening neuro decline noted 5/11 after admitting pt. Repeat imaging showed new area of ICH lateral to current ICH with MLS 5 mm. PEG tube placed 5/29. PMH: Sickle cell trait, sigmoid diverticulosis, asthma, OA, obesity, pre-diabetes   OT comments  Pt with improving command following, attention during tasks and overall initiation. Pt with profuse loose stool in bed on entry requiring Total A for cleanup prior to OOB attempts. Overall, pt requires Mod A x 2 for bed mobility and Max A x 2 for pivot to recliner on L side. RUE remains flaccid; elevated on pillow at end of session and encouraged spouse to provide gentle PROM within pt tolerance.    Recommendations for follow up therapy are one component of a multi-disciplinary discharge planning process, led by the attending physician.  Recommendations may be updated based on patient status, additional functional criteria and insurance authorization.    Assistance Recommended at Discharge Intermittent Supervision/Assistance  Patient can return home with the following  Two people to help with walking and/or transfers;Two people to help with bathing/dressing/bathroom;Assist for transportation   Equipment Recommendations  Wheelchair (measurements OT);Wheelchair cushion (measurements OT);Hospital bed    Recommendations for Other Services      Precautions / Restrictions Precautions Precautions: Fall Precaution Comments: PEG, abdominal binder Restrictions Weight Bearing Restrictions: No       Mobility Bed Mobility Overal bed mobility: Needs Assistance Bed Mobility: Rolling, Sidelying to Sit Rolling: Mod assist Sidelying to sit: Mod assist, +2 for safety/equipment       General bed  mobility comments: Using L UE to reach for bedrails to assist with rolling. Overall Mod A with slightly more assist needed to roll to L side d/t need for RLE assistance over LLE. Able to push LLE off to EOB w/ some assist, LUE on bedrail and Mod A to lift trunk to EOB    Transfers Overall transfer level: Needs assistance Equipment used: 1 [place] hand held assist, 2 Douty hand held assist Transfers: Sit to/from Stand, Bed to chair/wheelchair/BSC Sit to Stand: Max assist, +2 safety/equipment Stand pivot transfers: Max assist, +2 physical assistance, +2 safety/equipment         General transfer comment: Max A to stand at bedside w/ +2 for safety/assistance for peri care. Educated pt to reach LUE to armrest for transfer w/ pt able to recall with Max A x2 to pivot to recliner on L side; support to block R knee to prevent buckling     Balance Overall balance assessment: Needs assistance Sitting-balance support: Feet supported, No upper extremity supported, Single extremity supported Sitting balance-Leahy Scale: Fair Sitting balance - Comments: initial pushing with L UE that was easily redirectable with other tasks. Postural control: Right lateral lean Standing balance support: During functional activity, Bilateral upper extremity supported Standing balance-Leahy Scale: Poor                             ADL either performed or assessed with clinical judgement   ADL Overall ADL's : Needs assistance/impaired     Grooming: Set up;Bed level;Wash/dry face           Upper Body Dressing : Minimal assistance;Bed level Upper Body Dressing Details (indicate cue type and reason): assistance to  don R UE but pt reaching to don through L sleeve with cues on locating sleeve though good initiation Lower Body Dressing: Maximal assistance       Toileting- Clothing Manipulation and Hygiene: Total assistance;Bed level Toileting - Clothing Manipulation Details (indicate cue type and  reason): profuse amounts of loose stool on therapy arrival. increased time and Total A for clean up            Extremity/Trunk Assessment Upper Extremity Assessment Upper Extremity Assessment: RUE deficits/detail RUE Deficits / Details: flaccid tone present though PROM WFL; resting hand splint in room though had not been placed/assessed yet RUE Coordination: decreased fine motor;decreased gross motor   Lower Extremity Assessment Lower Extremity Assessment: Defer to PT evaluation        Vision   Vision Assessment?: Vision impaired- to be further tested in functional context Additional Comments: some R inattention though improving and able to locate/look at therapist on R side appropriately   Perception     Praxis      Cognition Arousal/Alertness: Awake/alert Behavior During Therapy: Flat affect (smiling/laughing at times) Overall Cognitive Status: Difficult to assess Area of Impairment: Following commands, Attention, Safety/judgement, Awareness, Problem solving                   Current Attention Level: Sustained   Following Commands: Follows one step commands consistently, Follows one step commands with increased time Safety/Judgement: Decreased awareness of safety, Decreased awareness of deficits Awareness: Intellectual Problem Solving: Slow processing, Difficulty sequencing, Requires verbal cues, Requires tactile cues, Decreased initiation General Comments: Pt voicing some responses in low volume, able to follow commands fairly consistently with multimodal cues. showing some insight into task performance (reaching to bedrails to sit up, recall of reaching for armrest during transfer and donning new gown). overall flat affect with some smiling and laughing during session. difficulty in verbalizing pain locations/needs        Exercises      Shoulder Instructions       General Comments Husband at bedside    Pertinent Vitals/ Pain       Pain Assessment Pain  Assessment: Faces Faces Pain Scale: Hurts little more Pain Location: abdomen Pain Descriptors / Indicators: Grimacing, Guarding Pain Intervention(s): Monitored during session, Other (comment) (notified RN)  Home Living                                          Prior Functioning/Environment              Frequency  Min 2X/week        Progress Toward Goals  OT Goals(current goals can now be found in the care plan section)  Progress towards OT goals: Progressing toward goals  Acute Rehab OT Goals Patient Stated Goal: none stated OT Goal Formulation: Patient unable to participate in goal setting Time For Goal Achievement: 08/25/22 Potential to Achieve Goals: Good ADL Goals Pt Will Perform Grooming: with mod assist;sitting Pt Will Perform Upper Body Dressing: with mod assist;sitting Pt Will Transfer to Toilet: with max assist;stand pivot transfer;bedside commode Additional ADL Goal #1: Pt will complete bed mobility with mod A  as a precursor to ADLs Additional ADL Goal #2: Pt will follow simple 1 step commands 50% of attempts to sequence through ADL task  Plan Discharge plan remains appropriate    Co-evaluation    PT/OT/SLP Co-Evaluation/Treatment: Yes Reason for Co-Treatment:  Complexity of the patient's impairments (multi-system involvement);Necessary to address cognition/behavior during functional activity;For patient/therapist safety;To address functional/ADL transfers   OT goals addressed during session: ADL's and self-care;Strengthening/ROM      AM-PAC OT "6 Clicks" Daily Activity     Outcome Measure   Help from another Larkey eating meals?: A Lot Help from another Iman taking care of personal grooming?: A Little Help from another Savarese toileting, which includes using toliet, bedpan, or urinal?: Total Help from another Dinning bathing (including washing, rinsing, drying)?: Total Help from another Gorter to put on and taking off regular upper  body clothing?: A Little Help from another Garn to put on and taking off regular lower body clothing?: Total 6 Click Score: 11    End of Session Equipment Utilized During Treatment: Gait belt  OT Visit Diagnosis: Unsteadiness on feet (R26.81);Other abnormalities of gait and mobility (R26.89);Muscle weakness (generalized) (M62.81);Hemiplegia and hemiparesis Hemiplegia - Right/Left: Right Hemiplegia - dominant/non-dominant: Dominant Hemiplegia - caused by: Other Nontraumatic intracranial hemorrhage   Activity Tolerance Patient tolerated treatment well   Patient Left in chair;with call bell/phone within reach;with family/visitor present;Other (comment) (chair alarm pad under pt though no green chair alarm box in room (had been in room previously). RN and Diplomatic Services operational officer aware; OT looked in equipment closet though unable to locate)   Nurse Communication Mobility status        Time: 5409-8119 OT Time Calculation (min): 37 min  Charges: OT General Charges $OT Visit: 1 Visit OT Treatments $Self Care/Home Management : 8-22 mins  Bradd Canary, OTR/L Acute Rehab Services Office: [phone]   Lorre Munroe 08/17/2022, 11:40 AM

## 2022-08-17 NOTE — TOC Progression Note (Signed)
Transition of Care [facility]) - Progression Note    Patient Details  Name: Anna Berry MRN: [id number] Date of Birth: October 07, 1960  Transition of Care [facility]) CM/SW Contact  Baldemar Lenis, [place] Phone Number: 08/17/2022, 4:25 PM  Clinical Narrative:   CSW noting per chart review of update that patient reportedly has no SNF benefit. CSW contacted patient's BCBS numerous times throughout the day to check on status of SNF coverage, as well as in-network options available. CSW first spoke with Lerry Liner, who indicated that patient has 60 days of SNF coverage, of which 0 days have been used so far. Patient has already met her deductible, and will have a 50% coinsurance until her out of pocket maximum is met. Patient has met [phone] of her $3150 out of pocket maximum already. [facility] is an in-network facility for SNF. CSW provided information to [facility].   [facility] contacted CSW back that they are unable to verify the in-network status when they call, they're being told that it's out of network. CSW contacted [employer] back again. CSW spoke with Jonny Ruiz, he indicated that [facility] shows up as in network based on the NPI, but out of network based on the tax ID. John redirected CSW to Public house manager to discuss. CSW spoke with Thayer Ohm with network management who indicated that they are unable to verify provider status, they only handle authorizations; CSW redirected back to benefits and eligibility. CSW spoke with Linwood Dibbles, who ultimately verified that [facility] is an in-network provider. CSW merged [facility] liaison into the call to verify the information. [facility] is able to accept, but will need to verify new authorization. [employer] is asking for updated clinicals, which have been sent. Waiting on final approval.   CSW met with patient's spouse at bedside to provide update that patient has hopefully been approved to admit to [facility]  tomorrow. CSW to follow.    Expected Discharge Plan: Skilled Nursing Facility Barriers to Discharge: Insurance Authorization  Expected Discharge Plan and Services   Discharge Planning Services: CM Consult Post Acute Care Choice: IP Rehab Living arrangements for the past 2 months: Single Family Home Expected Discharge Date: 08/14/22                                     Social Determinants of Health (SDOH) Interventions SDOH Screenings   Food Insecurity: Patient Unable To Answer (07/25/2022)  Housing: Low Risk  (07/25/2022)  Transportation Needs: Patient Unable To Answer (07/25/2022)  Utilities: Patient Unable To Answer (07/25/2022)  Depression (PHQ2-9): Low Risk  (03/27/2022)  Tobacco Use: Low Risk  (08/12/2022)    Readmission Risk Interventions     No data to display

## 2022-08-18 DIAGNOSIS — J690 Pneumonitis due to inhalation of food and vomit: Secondary | ICD-10-CM | POA: Diagnosis not present

## 2022-08-18 DIAGNOSIS — I611 Nontraumatic intracerebral hemorrhage in hemisphere, cortical: Secondary | ICD-10-CM | POA: Diagnosis not present

## 2022-08-18 LAB — GLUCOSE, CAPILLARY
Glucose-Capillary: 133 mg/dL — ABNORMAL HIGH (ref 70–99)
Glucose-Capillary: 136 mg/dL — ABNORMAL HIGH (ref 70–99)
Glucose-Capillary: 159 mg/dL — ABNORMAL HIGH (ref 70–99)
Glucose-Capillary: 190 mg/dL — ABNORMAL HIGH (ref 70–99)

## 2022-08-18 MED ORDER — POLYETHYLENE GLYCOL 3350 17 G PO PACK: 17.0000 g | PACK | Freq: Every day | ORAL | Status: DC | PRN

## 2022-08-18 MED ORDER — GLUCERNA 1.5 CAL PO LIQD
1000.0000 mL | ORAL | Status: DC
  Filled 2022-08-18: qty 1000

## 2022-08-18 MED ORDER — FAMOTIDINE 20 MG PO TABS: 20.0000 mg | ORAL_TABLET | Freq: Every day | ORAL | 0 refills | Status: DC

## 2022-08-18 MED ORDER — SENNOSIDES 8.8 MG/5ML PO SYRP: 5.0000 mL | ORAL_SOLUTION | Freq: Every evening | ORAL | 0 refills | Status: AC | PRN

## 2022-08-18 MED ORDER — POLYETHYLENE GLYCOL 3350 17 GM/SCOOP PO POWD: 17.0000 g | Freq: Two times a day (BID) | ORAL | Status: AC | PRN

## 2022-08-18 NOTE — Progress Notes (Signed)
Nutrition Follow-up  DOCUMENTATION CODES:  Not applicable  INTERVENTION:  Continue PO diet per SLP Ensure Enlive po BID, each supplement provides 350 kcal and 20 grams of protein. Magic cup TID with meals, each supplement provides 290 kcal and 9 grams of protein Continue nocturnal feeds of Glucerna 1.5 at 70 ml/hr x 10 hours (700 ml/day) - free water flush of q4h - Tube feeding regimen at goal rate provides 1050 kcal, 58 grams of protein, and 531 ml of H2O.(1667mL = TF+flush)  NUTRITION DIAGNOSIS:  Inadequate oral intake related to lethargy/confusion, inability to eat as evidenced by NPO status. - remains applicable   GOAL:  Patient will meet greater than or equal to 90% of their needs - progressing, diet advanced, TF changed to nocturnal  MONITOR:   Diet advancement, Labs, Weight trends, TF tolerance  REASON FOR ASSESSMENT:   Consult Calorie Count, Enteral/tube feeding initiation and management  ASSESSMENT:  62 year old female who presented to the ED on 5/10 as a Code Stroke. PMH of sickle cell trait, sigmoid diverticulosis, asthma, OA. Pt admitted with L frontal ICH with L ACA distribution, concerning for hemorrhagic transformation.  5/11 - new L frontal ICH alteral to previous hematoma 5/13 - SLP recommending NPO, Cortrak placement (distal stomach) 5/14 - moved out of ICU to floor 5/15 - SLP evaluation, NPO continued. Pt did not demonstrate intentional bolus manipulation of ice chips or of puree 5/20 - MBS, DYS1, thin liquids 5/22 - DYS2, thin liquids 5/31 - SLP evaluation, upgraded to DYS3/thin liquids  Pt resting in bed at the time of assessment. Family at bedside able to provide update on PO intake. Reports that pt doing pretty well with PO intake and they estimate that when they feed her she will eat about 50% of her food. Also doing well with ensure and will drink the majority of the bottles that are offered.    Will adjust TF regimen slightly as pt is likely  taken in more PO than previously. Pt does still need nocturnal needs until her PO intake is able to support >90% of her nutrition needs consistently.    Insurance authorization has been approved and pt is set to discharge today.  Nutritionally Relevant Medications: Scheduled Meds:  sennosides  5 mL Per Tube BID   And   docusate  50 mg Per Tube BID   famotidine  20 mg Per Tube Daily   Ensure Enlive  237 mL Oral BID BM   GLUCERNA 1.5 CAL  1,000 mL Per Tube Q24H   free water  200 mL Per Tube Q4H   insulin aspart  0-9 Units Subcutaneous Q4H   polyethylene glycol  17 g Per Tube BID   Labs Reviewed: CBG ranges from 170-205 mg/dL over the last 24 hours  NUTRITION - FOCUSED PHYSICAL EXAM: Flowsheet Row Most Recent Value  Orbital Region Moderate depletion  Upper Arm Region No depletion  Thoracic and Lumbar Region No depletion  Buccal Region Mild depletion  Temple Region No depletion  Clavicle Bone Region No depletion  Clavicle and Acromion Bone Region Mild depletion  Scapular Bone Region No depletion  Dorsal Hand No depletion  Patellar Region No depletion  Anterior Thigh Region No depletion  Posterior Calf Region No depletion  Edema (RD Assessment) Mild  [BLE]  Hair Reviewed  Eyes Reviewed  Mouth Reviewed  Skin Reviewed  Nails Reviewed   Diet Order:   Diet Order             DIET  DYS 3 Room service appropriate? Yes with Assist; Fluid consistency: Thin  Diet effective now           Diet - low sodium heart healthy                   EDUCATION NEEDS:  Education needs have been addressed  Skin:  Skin Assessment: Reviewed RN Assessment  Last BM:  6/3 - type 7  Height:  Ht Readings from Last 1 Encounters:  07/25/22 5\' 6"  (1.676 m)   Weight:  Wt Readings from Last 1 Encounters:  08/17/22 90.5 kg   Ideal Body Weight:  59.1 kg  BMI:  Body mass index is 32.2 kg/m.  Estimated Nutritional Needs:  Kcal:  1500-1800 kcal/d Protein:  80-100 g/d Fluid:   1.5-1.8L/d    Greig Castilla, RD, LDN Clinical Dietitian RD pager # available in [facility]  After hours/weekend pager # available in [facility]

## 2022-08-18 NOTE — Plan of Care (Signed)
  Problem: Coping: Goal: Will identify appropriate support needs Outcome: Progressing   

## 2022-08-18 NOTE — Discharge Summary (Signed)
Physician Discharge Summary   Patient: Anna Berry MRN: [id number] DOB: Aug 09, 1960  Admit date:     07/24/2022  Discharge date: 08/18/22  Discharge Physician: Kathlen Mody   PCP: Maury Dus, MD   Recommendations at discharge:  Please follow up with PCP in one week.  Please check liver enzymes next week and if normalized, can restart the statin.  Please follow up with cbc and BMP in one week.   Discharge Diagnoses: Principal Problem:   ICH (intracerebral hemorrhage) [facility])    Hospital Course: 62 year old F with PMH of asthma, diverticulosis, sickle cell trait and osteoarthritis brought to ED by EMS as code stroke due to inability to speak, and found to have left frontal ICH with left ACA distribution with concern for hemorrhagic transformation.  Done, she had new frontal ICH lateral to the previous hematoma.  Eventually, she was transferred out of ICU to hospitalist service on 5/15.   Hospital course complicated by fever and UTI for which she is on IV ceftriaxone for total of 7 days.   Patient remains confused and aphasic.  She has had Cortrak for tube feed.  She has been upgraded to dysphagia 2 diet on 5/22.  Tube feed decreased to nocturnal but p.o. intake remains poor.  IR consulted for G-tube placement after discussion with patient's husband.  G TUBE placed.    Microbiology summarized: 5/11-MRSA PCR screen nonreactive 5/16 GIP nonreactive 5/15-urine culture with E. coli and Klebsiella pneumonia 5/15-blood cultures NGTD 5/29 PEG tube placed.     Assessment and Plan:  Intracerebral hemorrhage Initial imaging showed left frontal ICH with left ACA distribution with concern for hemorrhagic transformation. Repeat CT 48 hours later showed stable left frontal hemorrhage and edema with mild subarachnoid extension at midline shift about 5 mm. CTV did not show any venous thrombosis. EEG is negative for seizures TTE is unremarkable LDL is 105, hemoglobin A1c is 6 Therapy  evaluations recommending SNF placement.          Dysphagia Patient was on core track tube feeds and dysphagia 2 diet. IR consulted for G-tube placement and it was placed  on 5/29. RD recommends nocturnal tube feeds and dysphagia 2 diet during the day.      Elevated blood pressure Patient required Levophed Prax in ICU  currently normotensive.     Hyperlipidemia Patient was on Crestor which was held for elevated liver enzymes.  Recommend checking liver panel next week and if normalized, can restart crestor.    Fever secondary to E. coli and Klebsiella pneumonia UTI Febrile to 100.5 in the last 24 hours. Septic workup sent with blood cultures, urine analysis urine culture and chest x-ray. CXR shows  left basilar atelectasis, suspicious for aspiration.  Started her on augmentin for aspiration pneumonia. Completed the course. No fevers today.  Leukocytosis resolved.        Lactic acidosis Resolved.     GERD Stable      Consultants: nEUROLOGY, IR Procedures performed: PEG PLACEMENT.   Disposition: Skilled nursing facility Diet recommendation:  Discharge Diet Orders (From admission, onward)     Start     Ordered   08/14/22 0000  Diet - low sodium heart healthy        08/14/22 0931           DYSPHAGIA 2 DIET WITH THIN LIQUIDS DURING THE DAY AND NOCTURNAL FEEDS DURING THE NIGHT.  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   albuterol 108 (90 Base) MCG/ACT  inhaler Commonly known as: VENTOLIN HFA Inhale 2 puffs into the lungs every 6 (six) hours as needed for wheezing or shortness of breath.   famotidine 20 MG tablet Commonly known as: PEPCID Place 1 tablet (20 mg total) into feeding tube daily. Start taking on: August 19, 2022   feeding supplement Liqd Take 237 mLs by mouth 2 (two) times daily between meals.   feeding supplement (GLUCERNA 1.5 CAL) Liqd Place 1,000 mLs into feeding tube daily.   free water Soln Place 200 mLs into feeding tube every 4 (four) hours.   loratadine 10 MG tablet Commonly known as: CLARITIN Take 10 mg by mouth daily as needed for allergies.   polyethylene glycol powder 17 GM/SCOOP powder Commonly known as: GLYCOLAX/MIRALAX Take 17 g by mouth 2 (two) times daily as needed for mild constipation.   sennosides 8.8 MG/5ML syrup Commonly known as: SENOKOT Place 5 mLs into feeding tube at bedtime as needed for mild constipation.   VISINE OP Place 1 drop into both eyes daily as needed (allergies).   white petrolatum Oint Commonly known as: VASELINE Apply 1 Application topically as needed for lip care.        Follow-up Information     [facility]. Schedule an appointment as soon as possible for a visit in 1 month(s).   Specialty: Neurology Why: stroke clinic Contact information: [address] [place] [address] [phone]               Discharge Exam: Ceasar Mons Weights   08/15/22 0500 08/16/22 0500 08/17/22 0412  Weight: 89.1 kg 88.6 kg 90.5 kg   General exam: Appears calm and comfortable  Respiratory system: Clear to auscultation. Respiratory effort normal. Cardiovascular system: S1 & S2 heard, RRR.  Gastrointestinal system: Abdomen is nondistended, soft and nontender. S/p PEG  Central nervous system: Alert , FOLLOWING simple commands.  Extremities: Symmetric 5 x 5 power. Skin: No rashes, lesions or ulcers Psychiatry: unable to  assess.    Condition at discharge: fair  The results of significant diagnostics from this hospitalization (including imaging, microbiology, ancillary and laboratory) are listed below for reference.   Imaging Studies: DG CHEST PORT 1 VIEW  Result Date: 08/12/2022 CLINICAL DATA:  Fever EXAM: PORTABLE CHEST 1 VIEW COMPARISON:  07/29/2022; 07/26/2018 FINDINGS: Grossly unchanged cardiac silhouette and mediastinal contours. Interval removal of left upper extremity approach PICC line. Otherwise, stable positioning of remaining support apparatus. Minimal left basilar heterogeneous opacities. The right lung remains well aerated. No pleural effusion or pneumothorax. No evidence of edema. No acute osseous abnormalities. IMPRESSION: Left basilar opacities, atelectasis versus infiltrate/aspiration. Continued attention on follow-up is advised. Electronically Signed   By: Simonne Come M.D.   On: 08/12/2022 10:47   IR GASTROSTOMY TUBE MOD SED  Result Date: 08/12/2022 INDICATION: History of stroke, now with dysphagia. Please perform percutaneous gastrostomy tube placement enteric  nutrition supplementation purposes. EXAM: PULL TROUGH GASTROSTOMY TUBE PLACEMENT COMPARISON:  CT abdomen and pelvis-08/09/2022 MEDICATIONS: The patient is currently the hospital receiving intravenous antibiotics. The antibiotics were administered within an appropriate time frame prior to the initiation of the procedure. Glucagon 1 mg IV CONTRAST:  10 cc of Omnipaque 300 administered into the gastric lumen. ANESTHESIA/SEDATION: Moderate (conscious) sedation was employed during this procedure as administered by the Interventional Radiology RN. A total of Versed 1 mg and Fentanyl 25 mcg was administered intravenously. Moderate Sedation Time: 10 minutes. The patient's level of consciousness and vital signs were monitored continuously by radiology nursing throughout the procedure under my direct supervision. FLUOROSCOPY TIME:  1 minute, 36 seconds  (17 mGy) COMPLICATIONS: None immediate. PROCEDURE: Informed written consent was obtained from the patient's family following explanation of the procedure, risks, benefits and alternatives. A time out was performed prior to the initiation of the procedure. Ultrasound scanning was performed to demarcate the edge of the left lobe of the liver. Maximal barrier sterile technique utilized including caps, mask, sterile gowns, sterile gloves, large sterile drape, hand hygiene and Betadine prep. The left upper quadrant was sterilely prepped and draped. An oral gastric catheter was inserted into the stomach under fluoroscopy. The existing nasogastric feeding tube was removed. The left costal margin and air opacified transverse colon were identified and avoided. Air was injected into the stomach for insufflation and visualization under fluoroscopy. Under sterile conditions a 17 gauge trocar needle was utilized to access the stomach percutaneously beneath the left subcostal margin after the overlying soft tissues were anesthetized with 1% Lidocaine with epinephrine. Needle position was confirmed within the stomach with aspiration of air and injection of small amount of contrast. A single T tack was deployed for gastropexy. Over an Amplatz guide wire, a 9-French sheath was inserted into the stomach. A snare device was utilized to capture the oral gastric catheter. The snare device was pulled retrograde from the stomach up the esophagus and out the oropharynx. The 20-French pull-through gastrostomy was connected to the snare device and pulled antegrade through the oropharynx down the esophagus into the stomach and then through the percutaneous tract external to the patient. The gastrostomy was assembled externally. Contrast injection confirms appropriate positioning within the stomach. Several spot radiographic images were obtained in various obliquities for documentation. Dressings were applied. The patient tolerated procedure  well without immediate post procedural complication. FINDINGS: After successful fluoroscopic guided placement, the gastrostomy tube is appropriately positioned with internal disc positioned against the inner ventral wall of the gastric lumen. IMPRESSION: Successful fluoroscopic insertion of a 20-French pull-through gastrostomy tube. The gastrostomy may be used immediately for medication administration and in 4 hrs for the initiation of feeds. Electronically Signed   By: Simonne Come M.D.   On: 08/12/2022 10:46   CT ABDOMEN WO CONTRAST  Result Date: 08/10/2022 CLINICAL DATA:  Hemorrhagic stroke, aphasia, dysphagia and workup for percutaneous gastrostomy tube for nutritional needs. EXAM: CT ABDOMEN WITHOUT CONTRAST TECHNIQUE: Multidetector CT imaging of the abdomen was performed following the standard protocol without IV contrast. RADIATION DOSE REDUCTION: This exam was performed according to the departmental dose-optimization program which includes automated exposure control, adjustment of the mA and/or kV according to patient size and/or use of iterative reconstruction technique. COMPARISON:  05/29/2010 FINDINGS: Lower chest: No acute abnormality. Hepatobiliary: No focal liver abnormality is seen. No gallstones, gallbladder wall thickening, or biliary dilatation. Pancreas: Unremarkable. No pancreatic ductal dilatation or surrounding inflammatory changes. Spleen: Normal in size without focal abnormality.  Adrenals/Urinary Tract: No adrenal masses. Superior parapelvic cyst of the left kidney measures 4 cm and has simple characteristics consistent with a Bosniak 1 cyst. This requires no follow-up. No hydronephrosis bilaterally. Stomach/Bowel: No hiatal hernia. A feeding tube is present that extends into the stomach and terminates in the read the pylorus/duodenal bulb. Gastric anatomy is normal with no interposition of colon between the stomach and abdominal wall. No bowel obstruction, ileus, free intraperitoneal air  or inflammatory process identified. No visible focal bowel lesion in the abdomen. Vascular/Lymphatic: No significant vascular findings are present. No enlarged abdominal lymph nodes. Other: No abdominal wall hernia. No ascites, focal fluid collections or body wall anasarca. Musculoskeletal: No acute or significant osseous findings. IMPRESSION: 1. Normal gastric anatomy with no interposition of colon between the stomach and abdominal wall. There is no anatomic contraindication to attempted percutaneous gastrostomy tube placement. 2. No acute findings in the abdomen. 3. 4 cm simple cyst of the left kidney. Left Bosniak I benign renal cyst measuring 4 cm. No follow-up imaging is recommended. JACR 2018 Feb; 264-273, Management of the Incidental Renal Mass on CT, [employer] 2021; 814-848, Bosniak Classification of Cystic Renal Masses, Version 2019. Electronically Signed   By: Irish Lack M.D.   On: 08/10/2022 09:08   US Abdomen Limited RUQ (LIVER/GB)  Result Date: 08/04/2022 CLINICAL DATA:  Abnormal LFTs. EXAM: ULTRASOUND ABDOMEN LIMITED RIGHT UPPER QUADRANT COMPARISON:  Ultrasound dated 05/24/2008. FINDINGS: Gallbladder: No gallstones or wall thickening visualized. No sonographic Murphy sign noted by sonographer. Common bile duct: Diameter: 3 mm Liver: There is diffuse increased liver echogenicity most commonly seen in the setting of fatty infiltration. Superimposed inflammation or fibrosis is not excluded. Clinical correlation is recommended. Portal vein is patent on color Doppler imaging with normal direction of blood flow towards the liver. Other: None. IMPRESSION: Fatty liver, otherwise unremarkable right upper quadrant ultrasound. Electronically Signed   By: Elgie Collard M.D.   On: 08/04/2022 23:41   DG Swallowing Func-Speech Pathology  Result Date: 08/03/2022 Table formatting from the original result was not included. Modified Barium Swallow Study Patient Details Name: Anna Berry MRN:  [id number] Date of Birth: 05-01-1960 Today's Date: 08/03/2022 HPI/PMH: HPI: Pt is a 62 y.o. female who presented with difficulty speaking. MRI brain 5/11: Large area of hemorrhage (approximately 18 mL) in the left frontal lobe, within the anterior cerebral artery territory. Pt passed [facility] 5/11, but then had neuro changes and could not complete a repeat [facility] due to lethargy. PMH: sickle cell trait, sigmoid diverticulosis, asthma, OA; BSE completed on 07/26/22 with NPO recommendation. ST f/u for SLE and po readiness. Clinical Impression: Clinical Impression: Pt presents with oral phase dysphagia characterized by reduced labial seal, oral holding, impaired mastication, and reduced bolus cohesion. She demonstrated anterior spillage, prolonged and disorganized mastication, oral residue, and premature spillage to the valleculae. No instances of penetration/aspiration were noted and pharyngeal clearance was WNL. A dysphagia 1 diet with thin liquids is recommended at this time. SLP will continue to follow for dysphagia treatment and prognosis for further diet advancement is judged to be good. Factors that may increase risk of adverse event in presence of aspiration Rubye Oaks & Clearance Coots 2021): Factors that may increase risk of adverse event in presence of aspiration Rubye Oaks & Clearance Coots 2021): Presence of tubes (ETT, trach, NG, etc.); Dependence for feeding and/or oral hygiene Recommendations/Plan: Swallowing Evaluation Recommendations Swallowing Evaluation Recommendations Recommendations: PO diet PO Diet Recommendation: Dysphagia 1 (Pureed); Thin liquids (Level 0) Liquid Administration via: Cup; Straw Medication  Administration: Crushed with puree (or via Cortrak) Supervision: Full supervision/cueing for swallowing strategies Swallowing strategies  : Follow solids with liquids; Small bites/sips Postural changes: Position pt fully upright for meals Oral care recommendations: Oral care BID (2x/day) Treatment Plan Treatment Plan  Treatment recommendations: Therapy as outlined in treatment plan below Follow-up recommendations: Acute inpatient rehab (3 hours/day) Functional status assessment: Patient has had a recent decline in their functional status and demonstrates the ability to make significant improvements in function in a reasonable and predictable amount of time. Treatment frequency: Min 2x/week Treatment duration: 2 weeks Interventions: Trials of upgraded texture/liquids; Diet toleration management by SLP Recommendations Recommendations for follow up therapy are one component of a multi-disciplinary discharge planning process, led by the attending physician.  Recommendations may be updated based on patient status, additional functional criteria and insurance authorization. Assessment: Orofacial Exam: Orofacial Exam Oral Cavity: Oral Hygiene: WFL Oral Cavity - Dentition: Adequate natural dentition Anatomy: Anatomy: WFL Boluses Administered: Boluses Administered Boluses Administered: Thin liquids (Level 0); Mildly thick liquids (Level 2, nectar thick); Moderately thick liquids (Level 3, honey thick); Puree; Solid  Oral Impairment Domain: Oral Impairment Domain Lip Closure: Escape beyond mid-chin Tongue control during bolus hold: Escape to lateral buccal cavity/floor of mouth; Posterior escape of less than half of bolus Bolus preparation/mastication: Disorganized chewing/mashing with solid pieces of bolus unchewed  Pharyngeal Impairment Domain: Pharyngeal Impairment Domain Soft palate elevation: No bolus between soft palate (SP)/pharyngeal wall (PW) Laryngeal elevation: Complete superior movement of thyroid cartilage with complete approximation of arytenoids to epiglottic petiole Anterior hyoid excursion: Complete anterior movement Epiglottic movement: Complete inversion Laryngeal vestibule closure: Complete, no air/contrast in laryngeal vestibule Pharyngeal stripping wave : Present - complete Pharyngeal contraction (A/P view only): N/A  Pharyngoesophageal segment opening: Complete distension and complete duration, no obstruction of flow Tongue base retraction: Trace column of contrast or air between tongue base and PPW Pharyngeal residue: Trace residue within or on pharyngeal structures Location of pharyngeal residue: Valleculae; Pyriform sinuses  Esophageal Impairment Domain: Esophageal Impairment Domain Esophageal clearance upright position: Complete clearance, esophageal coating Pill: Esophageal Impairment Domain Esophageal clearance upright position: Complete clearance, esophageal coating Penetration/Aspiration Scale Score: Penetration/Aspiration Scale Score 1.  Material does not enter airway: Thin liquids (Level 0); Mildly thick liquids (Level 2, nectar thick); Moderately thick liquids (Level 3, honey thick); Puree; Solid; Pill Compensatory Strategies: Compensatory Strategies Compensatory strategies: No   General Information: Caregiver present: No  Diet Prior to this Study: NPO; Cortrak/Small bore NG tube   Temperature : Normal   Respiratory Status: WFL   Supplemental O2: None (Room air)   History of Recent Intubation: No  Behavior/Cognition: Alert; Cooperative; Doesn't follow directions; Requires cueing Self-Feeding Abilities: Able to self-feed Baseline vocal quality/speech: Not observed Volitional Cough: Unable to elicit Volitional Swallow: Able to elicit Exam Limitations: -- (Difficulty following commands) Goal Planning: Prognosis for improved oropharyngeal function: Good Barriers to Reach Goals: Language deficits No data recorded Patient/Family Stated Goal: none stated No data recorded Pain: Pain Assessment Pain Assessment: No/denies pain Faces Pain Scale: 0 Breathing: 0 Negative Vocalization: 0 Facial Expression: 0 Body Language: 0 Consolability: 0 PAINAD Score: 0 Facial Expression: 0 Body Movements: 0 Muscle Tension: 0 Compliance with ventilator (intubated pts.): N/A Vocalization (extubated pts.): N/A CPOT Total: 0 Pain Location:  grimacing with noxious stimuli/movement of cortrak, redness at L eye Pain Descriptors / Indicators: Discomfort; Grimacing; Crying Pain Intervention(s): Monitored during session End of Session: Start Time:SLP Start Time (ACUTE ONLY): 1340 Stop Time: SLP Stop Time (ACUTE  ONLY): 1358 Time Calculation:SLP Time Calculation (min) (ACUTE ONLY): 18 min Charges: SLP Evaluations $ SLP Speech Visit: 1 Visit SLP Evaluations $BSS Swallow: 1 Procedure $MBS Swallow: 1 Procedure $ SLP EVAL LANGUAGE/SOUND PRODUCTION: 1 Procedure $Swallowing Treatment: 1 Procedure SLP visit diagnosis: SLP Visit Diagnosis: Dysphagia, oral phase (R13.11) Past Medical History: Past Medical History: Diagnosis Date  Allergy   Anemia   Arthritis   Asthma   allergies induced  Bilateral lower extremity edema   Depression   Fibroids 08/2002  Gross hematuria 05/2010  History of appendicitis   History of colon polyps 2017  Multiple renal cysts 05/24/2008  Two left renal parapelvic cysts.  OA (osteoarthritis) of knee   Right  Obesity   Plantar fasciitis   Postmenopausal   Pre-diabetes   Renal papillary necrosis (HCC)   Seasonal allergies   Sickle cell anemia (HCC)   sickle cell trait  Sickle cell trait (HCC)   Sigmoid diverticulosis 09/26/2015  Noted on colonoscopy Past Surgical History: Past Surgical History: Procedure Laterality Date  ABDOMINAL HYSTERECTOMY  2004  APPENDECTOMY  2006  CESAREAN SECTION  1981, 1987  x2  COLONOSCOPY    COLONOSCOPY W/ POLYPECTOMY  09/26/2015  POLYPECTOMY    TOTAL KNEE ARTHROPLASTY Right 07/29/2018  Procedure: RIGHT TOTAL KNEE ARTHROPLASTY;  Surgeon: Tarry Kos, MD;  Location: [facility];  Service: Orthopedics;  Laterality: Right; Shanika I. Vear Clock, MS, CCC-SLP Neuro Diagnostic Specialist Acute Rehabilitation Services Office number: [phone] Scheryl Marten 08/03/2022, 3:16 PM  DG CHEST PORT 1 VIEW  Result Date: 07/29/2022 CLINICAL DATA:  New onset fevers. EXAM: PORTABLE CHEST 1 VIEW COMPARISON:  07/26/18 FINDINGS: There is  a left arm PICC line with tip at the superior cavoatrial junction. Feeding tube courses below the level of the GE junction. Heart size appears normal. No pleural fluid or airspace disease. The visualized osseous structures are unremarkable. IMPRESSION: 1. No acute cardiopulmonary abnormalities. Electronically Signed   By: Signa Kell M.D.   On: 07/29/2022 13:58   DG Abd Portable 1V  Result Date: 07/27/2022 CLINICAL DATA:  Encounter for feeding tube placement. EXAM: PORTABLE ABDOMEN - 1 VIEW COMPARISON:  None. FINDINGS: Feeding tube tip projects over the distal stomach. The bowel gas pattern is normal. No radio-opaque calculi or other significant radiographic abnormality are seen. IMPRESSION: Feeding tube tip projects over the distal stomach. Electronically Signed   By: Orvan Falconer M.D.   On: 07/27/2022 14:56   ECHOCARDIOGRAM COMPLETE  Result Date: 07/27/2022    ECHOCARDIOGRAM REPORT   Patient Name:   Anna Berry Date of Exam: 07/27/2022 Medical Rec #:  [id number]        Height:       66.0 in Accession #:    [id number]       Weight:       192.2 lb Date of Birth:  27-Apr-1960       BSA:          1.966 m Patient Age:    61 years         BP:           133/74 mmHg Patient Gender: F                HR:           79 bpm. Exam Location:  Inpatient Procedure: 2D Echo, Cardiac Doppler and Color Doppler Indications:    Stroke  History:        Patient has no prior history of Echocardiogram examinations.  Sickel Cell Trait.  Sonographer:    Raeford Razor Referring Phys: [id number] [facility] KHALIQDINA IMPRESSIONS  1. Left ventricular ejection fraction, by estimation, is 55 to 60%. The left ventricle has normal function. The left ventricle has no regional wall motion abnormalities. Left ventricular diastolic parameters are consistent with Grade I diastolic dysfunction (impaired relaxation).  2. Right ventricular systolic function is normal. The right ventricular size is normal.  3. The mitral valve is normal  in structure. No evidence of mitral valve regurgitation. No evidence of mitral stenosis.  4. The aortic valve is tricuspid. Aortic valve regurgitation is not visualized. No aortic stenosis is present.  5. The inferior vena cava is normal in size with greater than 50% respiratory variability, suggesting right atrial pressure of 3 mmHg. Comparison(s): No prior Echocardiogram. FINDINGS  Left Ventricle: Left ventricular ejection fraction, by estimation, is 55 to 60%. The left ventricle has normal function. The left ventricle has no regional wall motion abnormalities. The left ventricular internal cavity size was normal in size. There is  no left ventricular hypertrophy. Left ventricular diastolic parameters are consistent with Grade I diastolic dysfunction (impaired relaxation). Right Ventricle: The right ventricular size is normal. Right ventricular systolic function is normal. Left Atrium: Left atrial size was normal in size. Right Atrium: Right atrial size was normal in size. Pericardium: There is no evidence of pericardial effusion. Mitral Valve: The mitral valve is normal in structure. No evidence of mitral valve regurgitation. No evidence of mitral valve stenosis. Tricuspid Valve: The tricuspid valve is normal in structure. Tricuspid valve regurgitation is trivial. No evidence of tricuspid stenosis. Aortic Valve: The aortic valve is tricuspid. Aortic valve regurgitation is not visualized. No aortic stenosis is present. Aortic valve mean gradient measures 5.0 mmHg. Aortic valve peak gradient measures 9.7 mmHg. Aortic valve area, by VTI measures 2.48 cm. Pulmonic Valve: The pulmonic valve was not well visualized. Pulmonic valve regurgitation is not visualized. No evidence of pulmonic stenosis. Aorta: The aortic root is normal in size and structure. Venous: The inferior vena cava is normal in size with greater than 50% respiratory variability, suggesting right atrial pressure of 3 mmHg. IAS/Shunts: No atrial level  shunt detected by color flow Doppler.  LEFT VENTRICLE PLAX 2D LVIDd:         3.90 cm   Diastology LVIDs:         3.00 cm   LV e' medial:    6.96 cm/s LV PW:         0.90 cm   LV E/e' medial:  12.8 LV IVS:        0.60 cm   LV e' lateral:   11.30 cm/s LVOT diam:     2.10 cm   LV E/e' lateral: 7.9 LV SV:         62 LV SV Index:   31 LVOT Area:     3.46 cm  RIGHT VENTRICLE             IVC RV Basal diam:  2.00 cm     IVC diam: 1.00 cm RV S prime:     20.10 cm/s TAPSE (M-mode): 2.5 cm LEFT ATRIUM             Index        RIGHT ATRIUM           Index LA diam:        3.20 cm 1.63 cm/m   RA Area:     11.40 cm LA Vol (A2C):  27.1 ml 13.78 ml/m  RA Volume:   24.10 ml  12.26 ml/m LA Vol (A4C):   40.0 ml 20.34 ml/m LA Biplane Vol: 36.8 ml 18.71 ml/m  AORTIC VALVE AV Area (Vmax):    2.53 cm AV Area (Vmean):   2.38 cm AV Area (VTI):     2.48 cm AV Vmax:           156.00 cm/s AV Vmean:          108.000 cm/s AV VTI:            0.249 m AV Peak Grad:      9.7 mmHg AV Mean Grad:      5.0 mmHg LVOT Vmax:         114.00 cm/s LVOT Vmean:        74.100 cm/s LVOT VTI:          0.178 m LVOT/AV VTI ratio: 0.71  AORTA Ao Root diam: 3.20 cm Ao Asc diam:  3.20 cm MITRAL VALVE                TRICUSPID VALVE MV Area (PHT): 5.79 cm     TR Peak grad:   23.6 mmHg MV E velocity: 88.80 cm/s   TR Vmax:        243.00 cm/s MV A velocity: 130.00 cm/s MV E/A ratio:  0.68         SHUNTS                             Systemic VTI:  0.18 m                             Systemic Diam: 2.10 cm Olga Millers MD Electronically signed by Olga Millers MD Signature Date/Time: 07/27/2022/12:38:46 PM    Final    CT HEAD WO CONTRAST ( )  Result Date: 07/27/2022 CLINICAL DATA:  Hemorrhagic stroke follow-up. EXAM: CT HEAD WITHOUT CONTRAST TECHNIQUE: Contiguous axial images were obtained from the base of the skull through the vertex without intravenous contrast. RADIATION DOSE REDUCTION: This exam was performed according to the departmental  dose-optimization program which includes automated exposure control, adjustment of the mA and/or kV according to patient size and/or use of iterative reconstruction technique. COMPARISON:  Two days ago FINDINGS: Brain: Regional hemorrhage in edema facet patchy hemorrhage in regional edema in the left frontal lobe is unchanged with small volume subarachnoid hemorrhage descending the inter hemispheric fissure. Close proximity to the left lateral ventricle but no intraventricular extension. No hydrocephalus. Midline shift still measures 5 mm at the level of the septum pellucidum. Vascular: No hyperdense vessel or unexpected calcification. Skull: Normal. Negative for fracture or focal lesion. Sinuses/Orbits: No acute finding. IMPRESSION: Unchanged left frontal hemorrhage and edema with mild subarachnoid extension. No new or progressive finding. Midline shift measures 5 mm. Electronically Signed   By: Tiburcio Pea M.D.   On: 07/27/2022 05:43   EEG adult  Result Date: 07/25/2022 Jefferson Fuel, MD     07/25/2022  8:18 PM Routine EEG Report Zariah Gluckman Birky is a 62 y.o. female with a history of intracranial hemorrhage and altered mental status who is undergoing an EEG to evaluate for seizures. Report: This EEG was acquired with electrodes placed according to the International 10-20 electrode system (including Fp1, Fp2, F3, F4, C3, C4, P3, P4, O1, O2, T3, T4, T5, T6, A1, A2, Fz, Cz,  Pz). The following electrodes were missing or displaced: none. The occipital dominant rhythm was 7 Hz. This activity is reactive to stimulation. Drowsiness was manifested by background fragmentation; deeper stages of sleep were not identified. There was focal slowing over the left frontal region. There were no interictal epileptiform discharges. There were no electrographic seizures identified. Photic stimulation and hyperventilation were not performed. Impression and clinical correlation: This EEG was obtained while awake and drowsy and  is abnormal due to mild diffuse slowing indicative of global cerebral dysfunction and left frontal slowing over the area of intracranial hemorrhage. Epileptiform abnormalities were not seen during this recording. Bing Neighbors, MD [facility] [phone] If 7pm- 7am, please page neurology on call as listed in AMION.   CT HEAD WO CONTRAST ( )  Result Date: 07/25/2022 CLINICAL DATA:  Intracranial hemorrhage follow up EXAM: CT HEAD WITHOUT CONTRAST TECHNIQUE: Contiguous axial images were obtained from the base of the skull through the vertex without intravenous contrast. RADIATION DOSE REDUCTION: This exam was performed according to the departmental dose-optimization program which includes automated exposure control, adjustment of the mA and/or kV according to patient size and/or use of iterative reconstruction technique. COMPARISON:  07/25/2022 at 9:04 a.m. FINDINGS: Brain: Compared to the most recent CT, the large mixed intra-axial and extra-axial hematoma at the left frontal lobe is unchanged. There is persistent rightward midline shift that measures 5 mm. Vascular: No hyperdense vessel or unexpected calcification. Skull: Normal. Negative for fracture or focal lesion. Sinuses/Orbits: No acute finding. Other: None. IMPRESSION: Unchanged large mixed intra- and extra-axial hematoma at the left frontal lobe with persistent rightward midline shift that measures 5 mm. Electronically Signed   By: Deatra Robinson M.D.   On: 07/25/2022 20:10   [place] EKG SITE RITE  Result Date: 07/25/2022 If Site Rite image not attached, placement could not be confirmed due to current cardiac rhythm.  CT HEAD WO CONTRAST  Result Date: 07/25/2022 CLINICAL DATA:  Stroke follow-up EXAM: CT HEAD WITHOUT CONTRAST TECHNIQUE: Contiguous axial images were obtained from the base of the skull through the vertex without intravenous contrast. RADIATION DOSE REDUCTION: This exam was performed according to the departmental  dose-optimization program which includes automated exposure control, adjustment of the mA and/or kV according to patient size and/or use of iterative reconstruction technique. COMPARISON:  CT and CTA from earlier today FINDINGS: Brain: Superior left frontal/parasagittal hematoma with progression in the adjacent frontal white matter. Hemorrhage is seen in both the parenchymal and subarachnoid compartments with mild brain edema. The area of newly affected brain had a normal appearance on the prior brain MRI. The CTV adn MRI are again reviewed and the superior sagittal sinus is patent and there is no detectable cortical vein thrombus. The new area of hemorrhage measures 3.4 x 1.8 x 1.7 cm. Rightward midline shift measures 5 mm. No hydrocephalus. Vascular: No hyperdense vessel or unexpected calcification. Skull: Normal. Negative for fracture or focal lesion. Sinuses/Orbits: No acute finding. Dr. Roda Shutters is aware of the findings at time of signing. IMPRESSION: Progression of left frontal ICH with new hematoma lateral to the presenting hematoma in the frontal lobe. Midline shift now measures 5 mm. Electronically Signed   By: Tiburcio Pea M.D.   On: 07/25/2022 09:37   CT VENOGRAM HEAD  Result Date: 07/25/2022 CLINICAL DATA:  ICH EXAM: CT VENOGRAM HEAD TECHNIQUE: Venographic phase images of the brain were obtained following the administration of intravenous contrast. Multiplanar reformats and maximum intensity projections were generated. RADIATION DOSE REDUCTION: This exam was  performed according to the departmental dose-optimization program which includes automated exposure control, adjustment of the mA and/or kV according to patient size and/or use of iterative reconstruction technique. CONTRAST:  75mL OMNIPAQUE IOHEXOL 350 MG/ML SOLN COMPARISON:  Head CT and brain MRI from earlier today FINDINGS: No dural venous sinus thrombosis. The superior sagittal sinus is diffusely patent, including adjacent to the known left  frontal hemorrhage. Deep and cortical veins also show no signs of thrombosis. The left transverse sigmoid outflow is dominant without stricture or other filling defect seen. No masslike enhancement at the hematoma. Follow-up after hemorrhage resolution may be useful in detecting compressed vascular lesion. IMPRESSION: Negative for dural venous sinus thrombosis. No vascular lesion seen underlying the hemorrhage. Electronically Signed   By: Tiburcio Pea M.D.   On: 07/25/2022 05:10   CT ANGIO HEAD NECK W WO CM  Result Date: 07/25/2022 CLINICAL DATA:  Hemorrhagic stroke. EXAM: CT ANGIOGRAPHY HEAD AND NECK WITH AND WITHOUT CONTRAST TECHNIQUE: Multidetector CT imaging of the head and neck was performed using the standard protocol during bolus administration of intravenous contrast. Multiplanar CT image reconstructions and MIPs were obtained to evaluate the vascular anatomy. Carotid stenosis measurements (when applicable) are obtained utilizing NASCET criteria, using the distal internal carotid diameter as the denominator. RADIATION DOSE REDUCTION: This exam was performed according to the departmental dose-optimization program which includes automated exposure control, adjustment of the mA and/or kV according to patient size and/or use of iterative reconstruction technique. CONTRAST:  75mL OMNIPAQUE IOHEXOL 350 MG/ML SOLN COMPARISON:  Head CT from earlier today FINDINGS: CTA NECK FINDINGS Aortic arch: 2 vessel branching. Right carotid system: Vessels are smoothly contoured and diffusely patent Left carotid system: Vessels are smoothly contoured and diffusely patent Vertebral arteries: No vertebral or subclavian stenosis or ulceration. Skeleton: Generalized cervical spine degeneration. Other neck: Negative Upper chest: Negative Review of the MIP images confirms the above findings CTA HEAD FINDINGS Anterior circulation: No spot sign, aneurysm, or signs of vascular malformation underlying the acute hemorrhage. Major  vessels are widely patent and smoothly contoured. Posterior circulation: No significant stenosis, proximal occlusion, aneurysm, or vascular malformation. Venous sinuses: Separate CT venogram. No dural venous sinus thrombosis or early enhancement. Anatomic variants: None significant Review of the MIP images confirms the above findings IMPRESSION: No vascular lesion or spot sign seen at the left frontal hemorrhage. Electronically Signed   By: Tiburcio Pea M.D.   On: 07/25/2022 05:08   CT Head Wo Contrast  Result Date: 07/25/2022 CLINICAL DATA:  Intracranial hemorrhage EXAM: CT HEAD WITHOUT CONTRAST TECHNIQUE: Contiguous axial images were obtained from the base of the skull through the vertex without intravenous contrast. RADIATION DOSE REDUCTION: This exam was performed according to the departmental dose-optimization program which includes automated exposure control, adjustment of the mA and/or kV according to patient size and/or use of iterative reconstruction technique. COMPARISON:  None Available. FINDINGS: Brain: Large acute intraparenchymal hematoma of the anterior superior left frontal lobe with overlying subarachnoid blood. Volume is approximately 18 mL. The hematoma is located within the left ACA territory. No midline shift or other mass effect. Vascular: No abnormal hyperdensity of the major intracranial arteries or dural venous sinuses. No intracranial atherosclerosis. Skull: The visualized skull base, calvarium and extracranial soft tissues are normal. Sinuses/Orbits: No fluid levels or advanced mucosal thickening of the visualized paranasal sinuses. No mastoid or middle ear effusion. The orbits are normal. IMPRESSION: 1. Large acute intraparenchymal hematoma of the anterior superior left frontal lobe with overlying subarachnoid blood. Volume  is approximately 18 mL. 2. No midline shift or other mass effect. Electronically Signed   By: Deatra Robinson M.D.   On: 07/25/2022 03:29   MR BRAIN WO  CONTRAST  Result Date: 07/25/2022 CLINICAL DATA:  Altered mental status EXAM: MRI HEAD WITHOUT CONTRAST TECHNIQUE: Multiplanar, multiecho pulse sequences of the brain and surrounding structures were obtained without intravenous contrast. COMPARISON:  None Available. FINDINGS: Brain: There is a large area of hemorrhage (approximately 7.0 x 2.0 x 2.5 cm) in the left frontal lobe, within the anterior cerebral artery territory. Mild surrounding edema. No visible acute ischemia, though susceptibility effects from the left frontal blood cause artifacts on the diffusion-weighted imaging. Normal white matter signal, parenchymal volume and CSF spaces. The midline structures are normal. Vascular: Major flow voids are preserved. Skull and upper cervical spine: Normal calvarium and skull base. Visualized upper cervical spine and soft tissues are normal. Sinuses/Orbits:No paranasal sinus fluid levels or advanced mucosal thickening. No mastoid or middle ear effusion. Normal orbits. IMPRESSION: Large area of hemorrhage (approximately 18 mL) in the left frontal lobe, within the anterior cerebral artery territory. The distribution raises suspicion for hemorrhagic conversion and ACA territory infarct. Head CT is recommended. Critical Value/emergent results were called by telephone at the time of interpretation on 07/25/2022 at 2:47 am to Dr. Preston Fleeting, who verbally acknowledged these results. Electronically Signed   By: Deatra Robinson M.D.   On: 07/25/2022 02:47    Microbiology: Results for orders placed or performed during the hospital encounter of 07/24/22  MRSA Next Gen by PCR, Nasal     Status: None   Collection Time: 07/25/22  5:46 AM   Specimen: Nasal Mucosa; Nasal Swab  Result Value Ref Range Status   MRSA by PCR Next Gen NOT DETECTED NOT DETECTED Final    Comment: (NOTE) The GeneXpert MRSA Assay (FDA approved for NASAL specimens only), is one component of a comprehensive MRSA colonization surveillance program. It is  not intended to diagnose MRSA infection nor to guide or monitor treatment for MRSA infections. Test performance is not FDA approved in patients less than 43 years old. Performed at [facility], [address] [place], [place] [address]   Culture, blood (Routine X 2) w Reflex to ID Panel     Status: None   Collection Time: 07/29/22 12:04 PM   Specimen: BLOOD RIGHT HAND  Result Value Ref Range Status   Specimen Description BLOOD RIGHT HAND  Final   Special Requests   Final    BOTTLES DRAWN AEROBIC AND ANAEROBIC Blood Culture adequate volume   Culture   Final    NO GROWTH 5 DAYS Performed at [facility], [address] [place], [place] [address]    Report Status 08/03/2022 FINAL  Final  Culture, blood (Routine X 2) w Reflex to ID Panel     Status: None   Collection Time: 07/29/22 12:04 PM   Specimen: BLOOD RIGHT ARM  Result Value Ref Range Status   Specimen Description BLOOD RIGHT ARM  Final   Special Requests   Final    BOTTLES DRAWN AEROBIC AND ANAEROBIC Blood Culture results may not be optimal due to an inadequate volume of blood received in culture bottles   Culture   Final    NO GROWTH 5 DAYS Performed at [facility], [address], [place], [place] [address]    Report Status 08/03/2022 FINAL  Final  Urine Culture (for pregnant, neutropenic or urologic patients or patients  with an indwelling urinary catheter)     Status: Abnormal   Collection Time: 07/29/22  6:08 PM   Specimen: Urine, Clean Catch  Result Value Ref Range Status   Specimen Description URINE, CLEAN CATCH  Final   Special Requests   Final    NONE Performed at [facility], [address], [place], [place] [address]    Culture (A)  Final    >=100,000 COLONIES/mL ESCHERICHIA COLI 20,000 COLONIES/mL KLEBSIELLA PNEUMONIAE    Report Status 08/02/2022 FINAL  Final   Organism ID, Bacteria ESCHERICHIA COLI (A)  Final   Organism ID, Bacteria KLEBSIELLA PNEUMONIAE (A)  Final       Susceptibility   Escherichia coli - MIC*    AMPICILLIN >=32 RESISTANT Resistant     CEFAZOLIN <=4 SENSITIVE Sensitive     CEFEPIME <=0.12 SENSITIVE Sensitive     CEFTRIAXONE <=0.25 SENSITIVE Sensitive     CIPROFLOXACIN >=4 RESISTANT Resistant     GENTAMICIN 4 SENSITIVE Sensitive     IMIPENEM <=0.25 SENSITIVE Sensitive     NITROFURANTOIN <=16 SENSITIVE Sensitive     TRIMETH/SULFA <=20 SENSITIVE Sensitive     AMPICILLIN/SULBACTAM >=32 RESISTANT Resistant     PIP/TAZO <=4 SENSITIVE Sensitive     * >=100,000 COLONIES/mL ESCHERICHIA COLI   Klebsiella pneumoniae - MIC*    AMPICILLIN >=32 RESISTANT Resistant     CEFAZOLIN <=4 SENSITIVE Sensitive     CEFEPIME <=0.12 SENSITIVE Sensitive     CEFTRIAXONE <=0.25 SENSITIVE Sensitive     CIPROFLOXACIN <=0.25 SENSITIVE Sensitive     GENTAMICIN <=1 SENSITIVE Sensitive     IMIPENEM <=0.25 SENSITIVE Sensitive     NITROFURANTOIN 64 INTERMEDIATE Intermediate     TRIMETH/SULFA <=20 SENSITIVE Sensitive     AMPICILLIN/SULBACTAM 4 SENSITIVE Sensitive     PIP/TAZO <=4 SENSITIVE Sensitive     * 20,000 COLONIES/mL KLEBSIELLA PNEUMONIAE  Gastrointestinal Panel by PCR , Stool     Status: None   Collection Time: 07/30/22  8:34 AM   Specimen: Stool  Result Value Ref Range Status   Campylobacter species NOT DETECTED NOT DETECTED Final   Plesimonas shigelloides NOT DETECTED NOT DETECTED Final   Salmonella species NOT DETECTED NOT DETECTED Final   Yersinia enterocolitica NOT DETECTED NOT DETECTED Final   Vibrio species NOT DETECTED NOT DETECTED Final   Vibrio cholerae NOT DETECTED NOT DETECTED Final   Enteroaggregative E coli (EAEC) NOT DETECTED NOT DETECTED Final   Enteropathogenic E coli (EPEC) NOT DETECTED NOT DETECTED Final   Enterotoxigenic E coli (ETEC) NOT DETECTED NOT DETECTED Final   Shiga like toxin producing E coli (STEC) NOT DETECTED NOT DETECTED Final   Shigella/Enteroinvasive E coli (EIEC) NOT DETECTED NOT DETECTED Final   Cryptosporidium NOT  DETECTED NOT DETECTED Final   Cyclospora cayetanensis NOT DETECTED NOT DETECTED Final   Entamoeba histolytica NOT DETECTED NOT DETECTED Final   Giardia lamblia NOT DETECTED NOT DETECTED Final   Adenovirus F40/41 NOT DETECTED NOT DETECTED Final   Astrovirus NOT DETECTED NOT DETECTED Final   Norovirus GI/GII NOT DETECTED NOT DETECTED Final   Rotavirus A NOT DETECTED NOT DETECTED Final   Sapovirus (I, II, IV, and V) NOT DETECTED NOT DETECTED Final    Comment: Performed at [facility], [address] [place], [place] [address]  Culture, blood (Routine X 2) w Reflex to ID Panel     Status: None   Collection Time: 08/12/22  9:48 AM   Specimen: BLOOD  Result Value Ref Range Status  Specimen Description BLOOD LEFT ANTECUBITAL  Final   Special Requests   Final    BOTTLES DRAWN AEROBIC AND ANAEROBIC Blood Culture adequate volume   Culture   Final    NO GROWTH 5 DAYS Performed at [facility], [address] [place], [place] [address]    Report Status 08/17/2022 FINAL  Final  Culture, blood (Routine X 2) w Reflex to ID Panel     Status: None   Collection Time: 08/12/22  9:58 AM   Specimen: BLOOD RIGHT WRIST  Result Value Ref Range Status   Specimen Description BLOOD RIGHT WRIST  Final   Special Requests   Final    BOTTLES DRAWN AEROBIC ONLY Blood Culture adequate volume   Culture   Final    NO GROWTH 5 DAYS Performed at [facility], [address] [place], [place] [address]    Report Status 08/17/2022 FINAL  Final    Labs: CBC: Recent Labs  Lab 08/12/22 0654 08/13/22 0908 08/14/22 0425  WBC 14.4* 12.5* 10.3  NEUTROABS  --  10.2* 8.0*  HGB 12.6 12.1 11.4*  HCT 38.5 37.3 34.7*  MCV 85.7 87.4 87.4  PLT 318 312 327    Basic Metabolic Panel: Recent Labs  Lab 08/12/22 0654 08/14/22 0425 08/17/22 0758  NA 139 142 141  K 4.3 4.2 4.7  CL 102 105 104  CO2 28 27 27   GLUCOSE 136* 158* 170*  BUN 16 21 24*  CREATININE 1.17* 1.13* 1.00  CALCIUM 9.2  9.1 9.5  MG 2.4  --   --   PHOS 4.0  --   --     Liver Function Tests: Recent Labs  Lab 08/12/22 0654 08/17/22 0758  AST 34 79*  ALT 101* 171*  ALKPHOS 87 120  BILITOT 0.8 0.2*  PROT 6.7 7.2  ALBUMIN 2.9* 2.8*    CBG: Recent Labs  Lab 08/17/22 1548 08/17/22 2051 08/18/22 0029 08/18/22 0338 08/18/22 0755  GLUCAP 167* 146* 133* 136* 159*     Discharge time spent: 36 minutes.   Signed: Kathlen Mody, MD [facility] 08/18/2022

## 2022-08-18 NOTE — TOC Transition Note (Signed)
Transition of Care [facility]) - CM/SW Discharge Note   Patient Details  Name: Anna Berry MRN: [id number] Date of Birth: [birth year]  Transition of Care [facility]) CM/SW Contact:  Baldemar Lenis, LCSW Phone Number: 08/18/2022, 1:22 PM   Clinical Narrative:   CSW confirmed with [facility] that they are able to accept patient today. CSW updated family at bedside, updated MD. CSW sent discharge summary to [facility], confirmed bed availability. Transport arranged with [facility] for next available.  Nurse to call report to [phone], Room 128b.    Final next level of care: Skilled Nursing Facility Barriers to Discharge: Barriers Resolved   Patient Goals and CMS Choice CMS Medicare.gov Compare Post Acute Care list provided to:: Patient Represenative (must comment) Choice offered to / list presented to : Spouse  Discharge Placement                Patient [facility] bed at: [facility] Patient to be transferred to facility by: [facility] Name of family member notified: Peyton Najjar Patient and family notified of of transfer: 08/18/22  Discharge Plan and Services Additional resources added to the After Visit Summary for     Discharge Planning Services: CM Consult Post Acute Care Choice: IP Rehab                               Social Determinants of Health (SDOH) Interventions SDOH Screenings   Food Insecurity: Patient Unable To Answer (07/25/2022)  Housing: Low Risk  (07/25/2022)  Transportation Needs: Patient Unable To Answer (07/25/2022)  Utilities: Patient Unable To Answer (07/25/2022)  Depression (PHQ2-9): Low Risk  (03/27/2022)  Tobacco Use: Low Risk  (08/12/2022)     Readmission Risk Interventions     No data to display

## 2022-10-05 ENCOUNTER — Telehealth: Payer: Self-pay

## 2022-10-05 NOTE — Transitions of Care (Post Inpatient/ED Visit) (Unsigned)
   10/05/2022  Name: Anna Berry MRN: [id number] DOB: 1960-06-11  Today's TOC FU Call Status: Today's TOC FU Call Status:: Unsuccessul Call (1st Attempt) Unsuccessful Call (1st Attempt) Date: 10/05/22  Attempted to reach the patient regarding the most recent Inpatient/ED visit.  Follow Up Plan: Additional outreach attempts will be made to reach the patient to complete the Transitions of Care (Post Inpatient/ED visit) call.   Signature Karena Addison, LPN [facility] Nurse Health Advisor Direct Dial [phone]

## 2022-10-06 ENCOUNTER — Telehealth: Payer: Self-pay

## 2022-10-06 NOTE — Telephone Encounter (Signed)
Mardella Layman from [facility] health calling for nursing verbal orders as follows:  2 time(s) weekly for 2 week(s), then 1 time(s) weekly for 2 week(s) and 2 PRN visits.   Social work evaluation also requested.    Verbal orders given per [facility] protocol.   RN also reports that patient has not yet scheduled a hospital follow up with our office. Called patient and LVM asking her to return call to schedule appointment for follow up.   Veronda Prude, RN

## 2022-10-06 NOTE — Transitions of Care (Post Inpatient/ED Visit) (Signed)
   10/06/2022  Name: Anna Berry MRN: [id number] DOB: 16-Dec-1960  Today's TOC FU Call Status: Today's TOC FU Call Status:: Unsuccessul Call (1st Attempt) Unsuccessful Call (1st Attempt) Date: 10/05/22  Attempted to reach the patient regarding the most recent Inpatient/ED visit. D/Ced to SNF Follow Up Plan: No further outreach attempts will be made at this time. We have been unable to contact the patient.  Signature Karena Addison, LPN [facility] Nurse Health Advisor Direct Dial [phone]

## 2022-10-12 ENCOUNTER — Telehealth: Payer: Self-pay

## 2022-10-12 NOTE — Telephone Encounter (Signed)
Boneta Lucks [facility] SLP calls nurse line requesting verbal orders for [facility] speech as follows.   2x a week for 2 weeks  1x a week for 3 weeks  Verbal order given.

## 2022-10-13 ENCOUNTER — Encounter: Payer: Self-pay | Admitting: Family Medicine

## 2022-10-13 ENCOUNTER — Ambulatory Visit (INDEPENDENT_AMBULATORY_CARE_PROVIDER_SITE_OTHER): Payer: BLUE CROSS/BLUE SHIELD | Admitting: Family Medicine

## 2022-10-13 VITALS — BP 100/72 | HR 72 | Ht 66.0 in | Wt 189.0 lb

## 2022-10-13 DIAGNOSIS — I611 Nontraumatic intracerebral hemorrhage in hemisphere, cortical: Secondary | ICD-10-CM

## 2022-10-13 MED ORDER — ROSUVASTATIN CALCIUM 20 MG PO TABS
20.0000 mg | ORAL_TABLET | Freq: Every day | ORAL | 3 refills | Status: AC
Start: 2022-10-13 — End: ?

## 2022-10-13 NOTE — Progress Notes (Signed)
  SUBJECTIVE:   CHIEF COMPLAINT / HPI:   F/u after admission for hemorrhagic stroke 5/10 - 6/4. Required ICU stay. G tube placed. EEG negative. CTV showed no venous thrombosis. TTE unremarkable. She received augmentin for aspiration pneumonia during her stay.  Crestor was held due to elevated LFTs  From DC Summary: Recommendations at discharge:  Please follow up with PCP in one week.  Please check liver enzymes next week and if normalized, can restart the statin.  Please follow up with cbc and BMP in one week.  Neuro appt scheduled for tomorrow 7/31  Daughter reports some continued confusion but she has been improving a lot  G tube still in place but not using it anymore  Has been able to bathe herself at home without issues. Not cooking or driving.  PERTINENT  PMH / PSH:   Past Medical History:  Diagnosis Date   Allergy    Anemia    Arthritis    Asthma    allergies induced   Bilateral lower extremity edema    Depression    Fibroids 08/2002   Gross hematuria 05/2010   History of appendicitis    History of colon polyps 2017   Multiple renal cysts 05/24/2008   Two left renal parapelvic cysts.   OA (osteoarthritis) of knee    Right   Obesity    Plantar fasciitis    Postmenopausal    Pre-diabetes    Renal papillary necrosis (HCC)    Seasonal allergies    Sickle cell anemia (HCC)    sickle cell trait   Sickle cell trait (HCC)    Sigmoid diverticulosis 09/26/2015   Noted on colonoscopy    OBJECTIVE:  BP 100/72   Pulse 72   Ht 5\' 6"  (1.676 m)   Wt 189 lb (85.7 kg)   SpO2 100%   BMI 30.51 kg/m   General: NAD, pleasant, able to participate in exam Cardiac: RRR, no murmurs auscultated Respiratory: CTAB, normal WOB Abdomen: soft, non-tender, non-distended, normoactive bowel sounds Extremities: warm and well perfused, no edema or cyanosis Skin: warm and dry, no rashes noted Neuro:CN II-XII intact w/o focal deficits. Speech slightly slow but forms coherent  sentences. Responds appropriately. Alert and oriented x4.  ASSESSMENT/PLAN:   1. 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  No longer requiring G-tube feeds.  SLP to see at home soon but will go ahead and initiate referral to IR to get her G-tube removed.  She has close follow-up scheduled with neurology tomorrow.  Discussed return precautions.  Restart statin, will check CBC and CMP per discharge recommendations.  If LFTs are significantly elevated will hold statin again. - Comprehensive metabolic panel - CBC with Differential - rosuvastatin (CRESTOR) 20 MG tablet; Take 1 tablet (20 mg total) by mouth daily.  Dispense: 90 tablet; Refill: 3 - Ambulatory referral to Interventional Radiology  Meds ordered this encounter  Medications   rosuvastatin (CRESTOR) 20 MG tablet    Sig: Take 1 tablet (20 mg total) by mouth daily.    Dispense:  90 tablet    Refill:  3   Return in about 3 months (around 01/13/2023), or if symptoms worsen or fail to improve.  Vonna Drafts, MD [facility]

## 2022-10-13 NOTE — Patient Instructions (Addendum)
You can start taking Crestor again.  I will give you a call if any of your lab results from today are abnormal and we need to stop the medication.  Please follow-up with neurology.  I have placed a referral to interventional radiology to get your G tube replaced ,you should get a call about this soon.

## 2022-10-14 ENCOUNTER — Ambulatory Visit: Payer: BLUE CROSS/BLUE SHIELD | Admitting: Adult Health

## 2022-10-14 ENCOUNTER — Telehealth: Payer: Self-pay | Admitting: Adult Health

## 2022-10-14 ENCOUNTER — Encounter: Payer: Self-pay | Admitting: Adult Health

## 2022-10-14 VITALS — BP 120/76 | HR 87 | Ht 67.0 in | Wt 187.0 lb

## 2022-10-14 DIAGNOSIS — I618 Other nontraumatic intracerebral hemorrhage: Secondary | ICD-10-CM | POA: Diagnosis not present

## 2022-10-14 DIAGNOSIS — E785 Hyperlipidemia, unspecified: Secondary | ICD-10-CM | POA: Diagnosis not present

## 2022-10-14 NOTE — Telephone Encounter (Signed)
MRI brain Ardell Isaacs: [id number] exp. 10/14/22-11/12/22  MRA head [employer] Landry Corporal: [id number] exp. 10/14/22-11/12/22 sent to [facility] because they are what is in  network with her plan. [phone]

## 2022-10-14 NOTE — Progress Notes (Signed)
PATIENT: Anna Berry DOB: [birth year]  REASON FOR VISIT: follow up HISTORY FROM: patient PRIMARY NEUROLOGIST: Dr. Pearlean Brownie  Chief Complaint  Patient presents with   Follow-up    Pt in 5 with daughter  Pt sates slower to do a task Pt states gait is off  Pt having trouble finding words to say Pt states right side weakness      HISTORY OF PRESENT ILLNESS: Today 10/14/22  Anna Berry is a 62 y.o. 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  Returns today for follow-up. Continues to have trouble with her speech- processing and expression. Weakness in the right arm. Passed swallowing test. Saw PCP yesterday and they reordered cholesterol medication. She is continuing with ST and PT at home. Husband is retired so he is at home with her.   HISTORY Ms. Anna Berry is a 62 y.o. female with history of sickle cell trait, diverticulosis, asthma admitted for speech difficulty. No tPA given due to ICH.     ICH: Initial left frontal ICH with left ACA distribution, concerning for hemorrhagic transformation.  Then new left frontal ICH lateral to previous hematoma, etiology unclear MRI brain left frontal ICH, within the ACA territory. CT head left frontal ICH along left ACA territory with overlying SAH.  Volume is approximately 18 ml CT head and neck unremarkable CTV no venous thrombosis 2D Echo LVEF 55-60%, no RWMA, normal LA size, no interatrial shunt detected. EEG no seizure LDL 105 HgbA1c 6.0 UDS negative Subcutaneous heparin for VTE prophylaxis No antithrombotic prior to admission, now on No antithrombotic due to ICH. Will plan to repeat CTA and MRI in 4-6 weeks as outpt to rule out mass lesion or vascular multiple patient once blood absorbed Therapy recommendations: CIR Disposition: pending    REVIEW OF SYSTEMS: Out of a complete 14 system review of symptoms, the patient complains only of the following symptoms,  and all other reviewed systems are negative.  ALLERGIES: Allergies  Allergen Reactions   Acetaminophen     Renal papillary necrosis in high doses   Morphine And Codeine Nausea And Vomiting   Nsaids     Renal papillary necrosis   Other Itching and Swelling    Tree nuts    HOME MEDICATIONS: Outpatient Medications Prior to Visit  Medication Sig Dispense Refill   acetaminophen (TYLENOL) 500 MG tablet Take 1,000 mg by mouth daily as needed for moderate pain.     albuterol (VENTOLIN HFA) 108 (90 Base) MCG/ACT inhaler Inhale 2 puffs into the lungs every 6 (six) hours as needed for wheezing or shortness of breath. 6.7 g 0   famotidine (PEPCID) 20 MG tablet Place 1 tablet (20 mg total) into feeding tube daily. 30 tablet 0   feeding supplement (ENSURE ENLIVE / ENSURE PLUS) LIQD Take 237 mLs by mouth 2 (two) times daily between meals. 237 mL 12   loratadine (CLARITIN) 10 MG tablet Take 10 mg by mouth daily as needed for allergies.      Nutritional Supplements (FEEDING SUPPLEMENT, GLUCERNA 1.5 CAL,) LIQD Place 1,000 mLs into feeding tube daily.     polyethylene glycol powder (GLYCOLAX/MIRALAX) 17 GM/SCOOP powder Take 17 g by mouth 2 (two) times daily as needed for mild constipation.     rosuvastatin (CRESTOR) 20 MG tablet Take 1 tablet (20 mg total) by mouth daily. 90 tablet 3   sennosides (SENOKOT) 8.8 MG/5ML syrup Place 5 mLs into feeding tube at  bedtime as needed for mild constipation. 240 mL 0   Tetrahydrozoline HCl (VISINE OP) Place 1 drop into both eyes daily as needed (allergies).     Water For Irrigation, Sterile (FREE WATER) SOLN Place 200 mLs into feeding tube every 4 (four) hours.     white petrolatum (VASELINE) OINT Apply 1 Application topically as needed for lip care.  0   No facility-administered medications prior to visit.    PAST MEDICAL HISTORY: Past Medical History:  Diagnosis Date   Allergy    Anemia    Arthritis    Asthma    allergies induced   Bilateral lower  extremity edema    Depression    Fibroids 08/2002   Gross hematuria 05/2010   History of appendicitis    History of colon polyps 2017   Multiple renal cysts 05/24/2008   Two left renal parapelvic cysts.   OA (osteoarthritis) of knee    Right   Obesity    Plantar fasciitis    Postmenopausal    Pre-diabetes    Renal papillary necrosis (HCC)    Seasonal allergies    Sickle cell anemia (HCC)    sickle cell trait   Sickle cell trait (HCC)    Sigmoid diverticulosis 09/26/2015   Noted on colonoscopy    PAST SURGICAL HISTORY: Past Surgical History:  Procedure Laterality Date   ABDOMINAL HYSTERECTOMY  2004   APPENDECTOMY  2006   CESAREAN SECTION  1981, 1987   x2   COLONOSCOPY     COLONOSCOPY W/ POLYPECTOMY  09/26/2015   IR GASTROSTOMY TUBE MOD SED  08/12/2022   POLYPECTOMY     TOTAL KNEE ARTHROPLASTY Right 07/29/2018   Procedure: RIGHT TOTAL KNEE ARTHROPLASTY;  Surgeon: Tarry Kos, MD;  Location: [facility];  Service: Orthopedics;  Laterality: Right;    FAMILY HISTORY: Family History  Problem Relation Age of Onset   Stomach cancer Maternal Grandmother 87   Stroke Maternal Grandmother    Diabetes Father    Kidney disease Father    Colon cancer Other    Colon polyps Neg Hx    Esophageal cancer Neg Hx    Rectal cancer Neg Hx     SOCIAL HISTORY: Social History   Socioeconomic History   Marital status: Married    Spouse name: Not on file   Number of children: Not on file   Years of education: Not on file   Highest education level: Not on file  Occupational History   Not on file  Tobacco Use   Smoking status: Never    Passive exposure: Never   Smokeless tobacco: Never  Vaping Use   Vaping status: Never Used  Substance and Sexual Activity   Alcohol use: No    Alcohol/week: 0.0 standard drinks of alcohol   Drug use: No   Sexual activity: Yes    Birth control/protection: Surgical  Other Topics Concern   Not on file  Social History Narrative   Not on file    Social Determinants of Health   Financial Resource Strain: Not on file  Food Insecurity: Patient Unable To Answer (07/25/2022)   Hunger Vital Sign    Worried About Running Out of Food in the Last Year: Patient unable to answer    Ran Out of Food in the Last Year: Patient unable to answer  Transportation Needs: Patient Unable To Answer (07/25/2022)   PRAPARE - Transportation    Lack of Transportation (Medical): Patient unable to answer    Lack of  Transportation (Non-Medical): Patient unable to answer  Physical Activity: Not on file  Stress: Not on file  Social Connections: Not on file  Intimate Partner Violence: Patient Unable To Answer (07/25/2022)   Humiliation, Afraid, Rape, and Kick questionnaire    Fear of Current or Ex-Partner: Patient unable to answer    Emotionally Abused: Patient unable to answer    Physically Abused: Patient unable to answer    Sexually Abused: Patient unable to answer      PHYSICAL EXAM  Vitals:   10/14/22 0918  BP: 120/76  Pulse: 87  Weight: 187 lb (84.8 kg)  Height: 5\' 7"  (1.702 m)   Body mass index is 29.29 kg/m.  Generalized: Well developed, in no acute distress   Neurological examination  Mentation: Alert oriented to time, place, history taking. Follows all commands. Global asphasia Cranial nerve II-XII: Pupils were equal round reactive to light. Extraocular movements were full, visual field were full on confrontational test. Facial sensation and strength were normal.  Head turning and shoulder shrug  were normal and symmetric. Motor: The motor testing reveals 5 over 5 strength in all extremities except right upper extremity 3/5. Good symmetric motor tone is noted throughout.  Sensory: Sensory testing is intact to soft touch on all 4 extremities. No evidence of extinction is noted.  Coordination: Cerebellar testing reveals good finger-nose-finger and heel-to-shin bilaterally.  Gait and station: Gait is normal.   DIAGNOSTIC DATA (LABS,  IMAGING, TESTING) - I reviewed patient records, labs, notes, testing and imaging myself where available.  Lab Results  Component Value Date   WBC 6.6 10/13/2022   HGB 12.1 10/13/2022   HCT 36.8 10/13/2022   MCV 84 10/13/2022   PLT 198 10/13/2022      Component Value Date/Time   NA 147 (H) 10/13/2022 1702   K 3.4 (L) 10/13/2022 1702   CL 108 (H) 10/13/2022 1702   CO2 26 10/13/2022 1702   GLUCOSE 102 (H) 10/13/2022 1702   GLUCOSE 170 (H) 08/17/2022 0758   BUN 6 (L) 10/13/2022 1702   CREATININE 0.87 10/13/2022 1702   CREATININE 1.08 (H) 01/08/2015 1652   CALCIUM 9.7 10/13/2022 1702   PROT 7.0 10/13/2022 1702   ALBUMIN 4.1 10/13/2022 1702   AST 14 10/13/2022 1702   ALT 14 10/13/2022 1702   ALKPHOS 124 (H) 10/13/2022 1702   BILITOT 0.6 10/13/2022 1702   GFRNONAA >60 08/17/2022 0758   GFRNONAA 55 (L) 11/26/2010 1424   GFRAA 71 07/13/2019 0953   GFRAA >60 11/26/2010 1424   Lab Results  Component Value Date   CHOL 164 07/25/2022   HDL 49 07/25/2022   LDLCALC 105 (H) 07/25/2022   TRIG 52 07/25/2022   CHOLHDL 3.3 07/25/2022   Lab Results  Component Value Date   HGBA1C 6.0 (H) 07/25/2022   No results found for: "VITAMINB12" Lab Results  Component Value Date   TSH 1.030 12/05/2018      ASSESSMENT AND PLAN 62 y.o. year old female  has a past medical history of Allergy, Anemia, Arthritis, Asthma, Bilateral lower extremity edema, Depression, Fibroids (08/2002), Gross hematuria (05/2010), History of appendicitis, History of colon polyps (2017), Multiple renal cysts (05/24/2008), OA (osteoarthritis) of knee, Obesity, Plantar fasciitis, Postmenopausal, Pre-diabetes, Renal papillary necrosis (HCC), Seasonal allergies, Sickle cell anemia (HCC), Sickle cell trait (HCC), and Sigmoid diverticulosis (09/26/2015). here with:  ICH: Initial left frontal ICH with left ACA distribution, concerning for hemorrhagic transformation.  Then new left frontal ICH lateral to previous hematoma,  etiology unclear  No thrombotic therapy right now d/t hemorrhagic transformation .  Discussed secondary stroke prevention measures and importance of close PCP follow up for aggressive stroke risk factor management. I have gone over the pathophysiology of stroke, warning signs and symptoms, risk factors and their management in some detail with instructions to go to the closest emergency room for symptoms of concern. HTN: BP goal <130/90.   HLD: LDL goal <70. Recent LDL 105.  DMII: A1c goal<7.0. Recent A1c 6.0.  Encouraged patient to monitor diet and encouraged exercise Repeat MRI/MRA of brain  FU with our office 6 months      Butch Penny, MSN, NP-C 10/14/2022, 9:48 AM [facility] [address], [address] [place], [place] [address] [phone]

## 2022-10-16 NOTE — Progress Notes (Signed)
I agree with the above plan 

## 2022-10-21 ENCOUNTER — Telehealth: Payer: Self-pay | Admitting: *Deleted

## 2022-10-21 NOTE — Telephone Encounter (Signed)
I called the pt's daughter (on [place]) and LVM advising order for MRI and MRA brain have been ordered and approved by insurance. [facility] has the order. They should call to schedule but if she hasn't heard from them yet, I asked her to call them at [phone]. She's also welcome to call our office if needed. I left our office number in the message.   Message Received: 1 week ago Butch Penny, NP  P [facility] Calls Please call patient's daughter and let her know that I have ordered an MRI and an MRA of the brain

## 2022-10-21 NOTE — Telephone Encounter (Signed)
-----   Message from Butch Penny sent at 10/14/2022  4:08 PM EDT ----- Please call patient's daughter and let her know that I have ordered an MRI and an MRA of the brain

## 2022-10-30 ENCOUNTER — Telehealth: Payer: Self-pay

## 2022-10-30 NOTE — Telephone Encounter (Signed)
Delay in documentation-  Received call from patient's home health RN on Wednesday, 10/28/22 regarding discharging from home health nursing.   [facility] RN also reported patient experiencing BLE edema. Denies shortness of breath or chest pain.   Scheduled for next available appointment. Appointment scheduled with Dr. Jena Gauss on 11/06/22. Discussed ED precautions with family.  Patient and daughter also have questions regarding when PEG tube can be removed.   Will forward to PCP for further advisement regarding PEG tube.   Veronda Prude, RN

## 2022-11-02 NOTE — Telephone Encounter (Signed)
Scheduled 8/27 at 11am.

## 2022-11-03 ENCOUNTER — Telehealth: Payer: Self-pay

## 2022-11-03 NOTE — Telephone Encounter (Signed)
Radovan from [facility] calling for OT verbal orders as follows:  1 time(s) weekly for 6 week(s)  Verbal orders given per [facility] protocol  Veronda Prude, RN

## 2022-11-06 ENCOUNTER — Ambulatory Visit (INDEPENDENT_AMBULATORY_CARE_PROVIDER_SITE_OTHER): Payer: BLUE CROSS/BLUE SHIELD | Admitting: Student

## 2022-11-06 ENCOUNTER — Encounter: Payer: Self-pay | Admitting: Student

## 2022-11-06 VITALS — BP 138/74 | HR 66 | Wt 188.0 lb

## 2022-11-06 DIAGNOSIS — R319 Hematuria, unspecified: Secondary | ICD-10-CM

## 2022-11-06 DIAGNOSIS — I611 Nontraumatic intracerebral hemorrhage in hemisphere, cortical: Secondary | ICD-10-CM

## 2022-11-06 DIAGNOSIS — M25473 Effusion, unspecified ankle: Secondary | ICD-10-CM

## 2022-11-06 NOTE — Patient Instructions (Addendum)
It was great to see you today! Thank you for choosing [facility] for your primary care.  Today we addressed: I will obtain some labs today Wear compression stockings, avoid salt, elevate legs  I have sent a referral to the speech and physical therapists outpatient  I will call with results  Return in 3-4 weeks   If you haven't already, sign up for My Chart to have easy access to your labs results, and communication with your primary care physician. We are checking some labs today. If they are abnormal, I will call you. If they are normal, I will send you a MyChart message (if it is active) or a letter in the mail. If you do not hear about your labs in the next 2 weeks, please call the office. I recommend that you always bring your medications to each appointment as this makes it easy to ensure you are on the correct medications and helps [place] not miss refills when you need them. Call the clinic at [phone] if your symptoms worsen or you have any concerns. Return in about 4 weeks (around 12/04/2022) for Ankle swelling, stroke follow up. Please arrive 15 minutes before your appointment to ensure smooth check in process.  We appreciate your efforts in making this happen.  Thank you for allowing me to participate in your care, Alfredo Martinez, MD 11/06/2022, 9:46 AM PGY-3, [facility]

## 2022-11-06 NOTE — Assessment & Plan Note (Signed)
Recently graduated from home health physical therapy.  Will place speech referral and referral to ambulatory physical therapy outpatient at patient request.  She still has the G-tube in place, has IR referral already placed.  Will follow-up with the patient in the next few weeks to ensure that she has the tube removed.

## 2022-11-06 NOTE — Progress Notes (Signed)
  SUBJECTIVE:   CHIEF COMPLAINT / HPI:   Swelling in Legs & Feet:  - Onset around 10/28/22 - No dyspnea or chest pain  - Echo 07/2022 EF 55-60% - No urinary concerns  -No fever or chills  - Using compression stockings to minimal benefit - Has not been elevating her feet Daughter giving history in the room with patient.   Has yet to have G tube removed with IR.   PERTINENT  PMH / PSH:  Diverticulosis Sickle cell trait Prediabetes Recent hemorrhagic stroke requiring ICU stay--see last note with Dr. Barb Merino  Depression Anemia     Patient Care Team: Vonna Drafts, MD as PCP - General (Family Medicine) OBJECTIVE:  BP 138/74   Pulse 66   Wt 188 lb (85.3 kg)   SpO2 98%   BMI 29.44 kg/m  Physical Exam  General: NAD, pleasant, able to participate in exam Respiratory: No respiratory distress Skin: warm and dry, no rashes noted Abd: tube clean and dry, no skin erosion Extremities: 2+ pitting edema from foot to mid ankle bilaterally, palpable DP pulse, limited range of motion of the right ankle Psych: Normal affect and mood  ASSESSMENT/PLAN:  Ankle swelling, unspecified laterality Assessment & Plan: Broad differential including heart failure although less likely given recent echocardiogram but will obtain BNP, hypothyroidism-obtaining TSH, liver/kidney etiology-obtaining CMP, obtaining urine PCR to evaluate for nephrotic syndrome.  Could also be related to venous stasis, encouraged compression stocking use in addition to elevating feet and decreasing salt intake.  Will call patient with results.  No notes of DVT given equivalent swelling bilaterally. Return in 4 weeks   Orders: -     Comprehensive metabolic panel -     Protein / creatinine ratio, urine -     TSH Rfx on Abnormal to Free T4 -     Brain natriuretic peptide  Nontraumatic cortical hemorrhage of left cerebral hemisphere [facility]) Assessment & Plan: Recently graduated from home health physical therapy.  Will place  speech referral and referral to ambulatory physical therapy outpatient at patient request.  She still has the G-tube in place, has IR referral already placed.  Will follow-up with the patient in the next few weeks to ensure that she has the tube removed.  Orders: -     Ambulatory referral to Speech Therapy -     Ambulatory referral to Physical Therapy  Hematuria, unspecified type Assessment & Plan: When the patient was providing urine protein creatinine, she reported that she had hematuria.  This was after the visit was completed.  She does have a history of hematuria and sickle cell trait.  Will obtain a UA, did not endorse any urinary symptoms during our visit.  Last hemoglobin very recently was normal.  Will have the patient follow-up with Dr. Barb Merino for this concern.  Orders: -     Urinalysis -     Urine Culture   Return in about 4 weeks (around 12/04/2022) for Ankle swelling, stroke follow up. Alfredo Martinez, MD 11/06/2022, 11:10 AM PGY-3, [facility]

## 2022-11-06 NOTE — Assessment & Plan Note (Signed)
When the patient was providing urine protein creatinine, she reported that she had hematuria.  This was after the visit was completed.  She does have a history of hematuria and sickle cell trait.  Will obtain a UA, did not endorse any urinary symptoms during our visit.  Last hemoglobin very recently was normal.  Will have the patient follow-up with Dr. Barb Merino for this concern.

## 2022-11-06 NOTE — Assessment & Plan Note (Signed)
Broad differential including heart failure although less likely given recent echocardiogram but will obtain BNP, hypothyroidism-obtaining TSH, liver/kidney etiology-obtaining CMP, obtaining urine PCR to evaluate for nephrotic syndrome.  Could also be related to venous stasis, encouraged compression stocking use in addition to elevating feet and decreasing salt intake.  Will call patient with results.  No notes of DVT given equivalent swelling bilaterally. Return in 4 weeks

## 2022-11-07 LAB — COMPREHENSIVE METABOLIC PANEL
ALT: 13 IU/L (ref 0–32)
AST: 15 IU/L (ref 0–40)
Albumin: 4.3 g/dL (ref 3.9–4.9)
Alkaline Phosphatase: 104 IU/L (ref 44–121)
BUN/Creatinine Ratio: 8 — ABNORMAL LOW (ref 12–28)
BUN: 7 mg/dL — ABNORMAL LOW (ref 8–27)
Bilirubin Total: 0.4 mg/dL (ref 0.0–1.2)
CO2: 26 mmol/L (ref 20–29)
Calcium: 9.9 mg/dL (ref 8.7–10.3)
Chloride: 103 mmol/L (ref 96–106)
Creatinine, Ser: 0.92 mg/dL (ref 0.57–1.00)
Globulin, Total: 2.8 g/dL (ref 1.5–4.5)
Glucose: 94 mg/dL (ref 70–99)
Potassium: 3.9 mmol/L (ref 3.5–5.2)
Sodium: 148 mmol/L — ABNORMAL HIGH (ref 134–144)
Total Protein: 7.1 g/dL (ref 6.0–8.5)
eGFR: 70 mL/min/{1.73_m2} (ref >59)

## 2022-11-07 LAB — BRAIN NATRIURETIC PEPTIDE: BNP: 23.7 pg/mL (ref 0.0–100.0)

## 2022-11-07 LAB — TSH RFX ON ABNORMAL TO FREE T4: TSH: 0.825 u[IU]/mL (ref 0.450–4.500)

## 2022-11-08 ENCOUNTER — Ambulatory Visit (HOSPITAL_COMMUNITY)
Admission: EM | Admit: 2022-11-08 | Discharge: 2022-11-08 | Disposition: A | Discharge status: Home / Self Care | Payer: BLUE CROSS/BLUE SHIELD | Attending: Physician Assistant | Admitting: Physician Assistant

## 2022-11-08 ENCOUNTER — Encounter (HOSPITAL_COMMUNITY): Payer: Self-pay

## 2022-11-08 DIAGNOSIS — R051 Acute cough: Secondary | ICD-10-CM | POA: Diagnosis present

## 2022-11-08 DIAGNOSIS — Z1152 Encounter for screening for COVID-19: Secondary | ICD-10-CM | POA: Insufficient documentation

## 2022-11-08 MED ORDER — BENZONATATE 100 MG PO CAPS: 100.0000 mg | ORAL_CAPSULE | Freq: Three times a day (TID) | ORAL | 0 refills | Status: DC

## 2022-11-08 NOTE — Discharge Instructions (Signed)
Will call with test results Recommend over the counter Delsym for cough as needed Can take Tessalon as needed for cough Drink plenty of fluids, rest If you develop congestion recommend Flonase and Mucinex

## 2022-11-08 NOTE — ED Triage Notes (Signed)
Patient 's husband reports that he was Covid positive yesterday and he is concerned about his wife because she has had a stroke this year. Patient is exhibiting no symptoms.

## 2022-11-08 NOTE — ED Provider Notes (Signed)
[facility]    CSN: [id number] Arrival date & time: 11/08/22  1504      History   Chief Complaint Chief Complaint  Patient presents with   Covid Exposure    HPI Anna Berry is a 62 y.o. female.   Patient with mild cough that started yesterday.  Husband is sick with COVID at this time.  She denies body aches, fevers, congestion, nausea, vomiting.  She is a history of stroke earlier this year.  She has not taken a home COVID test.      Past Medical History:  Diagnosis Date   Allergy    Anemia    Arthritis    Asthma    allergies induced   Bilateral lower extremity edema    Depression    Fibroids 08/2002   Gross hematuria 05/2010   History of appendicitis    History of colon polyps 2017   Multiple renal cysts 05/24/2008   Two left renal parapelvic cysts.   OA (osteoarthritis) of knee    Right   Obesity    Plantar fasciitis    Postmenopausal    Pre-diabetes    Renal papillary necrosis (HCC)    Seasonal allergies    Sickle cell anemia (HCC)    sickle cell trait   Sickle cell trait (HCC)    Sigmoid diverticulosis 09/26/2015   Noted on colonoscopy    Patient Active Problem List   Diagnosis Date Noted   Ankle swelling 11/06/2022   ICH (intracerebral hemorrhage) (HCC) 07/25/2022   Shoulder pain, right 09/15/2021   H/O: hysterectomy 09/12/2021   Chest pain due to GERD 09/08/2021   Acute cystitis with hematuria 06/30/2019   History of total knee replacement, 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   Obesity (BMI 30-39.9) 05/11/2012   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   Asthma 12/29/2009   Osteoarthritis of right knee 08/26/2009   Hematuria 05/21/2008   ALLERGIC RHINITIS 05/29/2007   SICKLE CELL TRAIT 05/13/2006    Past Surgical History:  Procedure Laterality Date   ABDOMINAL HYSTERECTOMY  2004   APPENDECTOMY  2006   CESAREAN SECTION  1981, 1987   x2   COLONOSCOPY     COLONOSCOPY W/ POLYPECTOMY   09/26/2015   IR GASTROSTOMY TUBE MOD SED  08/12/2022   POLYPECTOMY     TOTAL KNEE ARTHROPLASTY Right 07/29/2018   Procedure: RIGHT TOTAL KNEE ARTHROPLASTY;  Surgeon: Tarry Kos, MD;  Location: [facility];  Service: Orthopedics;  Laterality: Right;    OB History   No obstetric history on file.      Home Medications    Prior to Admission medications   Medication Sig Start Date End Date Taking? Authorizing Provider  benzonatate (TESSALON) 100 MG capsule Take 1 capsule (100 mg total) by mouth every 8 (eight) hours. 11/08/22  Yes Ward, Tylene Fantasia, PA-C  acetaminophen (TYLENOL) 500 MG tablet Take 1,000 mg by mouth daily as needed for moderate pain.    [provider]  albuterol (VENTOLIN HFA) 108 (90 Base) MCG/ACT inhaler Inhale 2 puffs into the lungs every 6 (six) hours as needed for wheezing or shortness of breath. 06/26/22   Maury Dus, MD  famotidine (PEPCID) 20 MG tablet Place 1 tablet (20 mg total) into feeding tube daily. 08/19/22   Kathlen Mody, MD  feeding supplement (ENSURE ENLIVE / ENSURE PLUS) LIQD Take 237 mLs by mouth 2 (two) times daily between meals. 08/14/22   Kathlen Mody, MD  loratadine (CLARITIN)  10 MG tablet Take 10 mg by mouth daily as needed for allergies.     [provider]  Nutritional Supplements (FEEDING SUPPLEMENT, GLUCERNA 1.5 CAL,) LIQD Place 1,000 mLs into feeding tube daily. 08/14/22   Kathlen Mody, MD  polyethylene glycol powder (GLYCOLAX/MIRALAX) 17 GM/SCOOP powder Take 17 g by mouth 2 (two) times daily as needed for mild constipation. 08/18/22   Kathlen Mody, MD  rosuvastatin (CRESTOR) 20 MG tablet Take 1 tablet (20 mg total) by mouth daily. 10/13/22   Vonna Drafts, MD  sennosides (SENOKOT) 8.8 MG/5ML syrup Place 5 mLs into feeding tube at bedtime as needed for mild constipation. 08/18/22   Kathlen Mody, MD  Tetrahydrozoline HCl (VISINE OP) Place 1 drop into both eyes daily as needed (allergies).    [provider]  Water For  Irrigation, Sterile (FREE WATER) SOLN Place 200 mLs into feeding tube every 4 (four) hours. 08/14/22   Kathlen Mody, MD  white petrolatum (VASELINE) OINT Apply 1 Application topically as needed for lip care. 08/14/22   Kathlen Mody, MD    Family History Family History  Problem Relation Age of Onset   Diabetes Father    Kidney disease Father    Stomach cancer Maternal Grandmother 19   Stroke Maternal Grandmother    Colon cancer Other    Colon polyps Neg Hx    Esophageal cancer Neg Hx    Rectal cancer Neg Hx     Social History Social History   Tobacco Use   Smoking status: Never    Passive exposure: Never   Smokeless tobacco: Never  Vaping Use   Vaping status: Never Used  Substance Use Topics   Alcohol use: No    Alcohol/week: 0.0 standard drinks of alcohol   Drug use: No     Allergies   Acetaminophen, Morphine and codeine, Nsaids, and Other   Review of Systems Review of Systems  Constitutional:  Negative for chills and fever.  HENT:  Negative for ear pain and sore throat.   Eyes:  Negative for pain and visual disturbance.  Respiratory:  Positive for cough. Negative for shortness of breath.   Cardiovascular:  Negative for chest pain and palpitations.  Gastrointestinal:  Negative for abdominal pain and vomiting.  Genitourinary:  Negative for dysuria and hematuria.  Musculoskeletal:  Negative for arthralgias and back pain.  Skin:  Negative for color change and rash.  Neurological:  Negative for seizures and syncope.  All other systems reviewed and are negative.    Physical Exam Triage Vital Signs ED Triage Vitals  Encounter Vitals Group     BP 11/08/22 1614 (!) 142/88     Systolic BP Percentile --      Diastolic BP Percentile --      Pulse Rate 11/08/22 1614 65     Resp 11/08/22 1614 16     Temp 11/08/22 1614 98.1 F (36.7 C)     Temp Source 11/08/22 1614 Oral     SpO2 11/08/22 1614 95 %     Weight --      Height --      Head Circumference --      Peak  Flow --      Pain Score 11/08/22 1616 0     Pain Loc --      Pain Education --      Exclude from Growth Chart --    No data found.  Updated Vital Signs BP (!) 142/88 (BP Location: Left Arm)   Pulse  65   Temp 98.1 F (36.7 C) (Oral)   Resp 16   SpO2 95%   Visual Acuity Right Eye Distance:   Left Eye Distance:   Bilateral Distance:    Right Eye Near:   Left Eye Near:    Bilateral Near:     Physical Exam Vitals and nursing note reviewed.  Constitutional:      General: She is not in acute distress.    Appearance: She is well-developed.  HENT:     Head: Normocephalic and atraumatic.  Eyes:     Conjunctiva/sclera: Conjunctivae normal.  Cardiovascular:     Rate and Rhythm: Normal rate and regular rhythm.     Heart sounds: No murmur heard. Pulmonary:     Effort: Pulmonary effort is normal. No respiratory distress.     Breath sounds: Normal breath sounds.  Abdominal:     Palpations: Abdomen is soft.     Tenderness: There is no abdominal tenderness.  Musculoskeletal:        General: No swelling.     Cervical back: Neck supple.  Skin:    General: Skin is warm and dry.     Capillary Refill: Capillary refill takes less than 2 seconds.  Neurological:     Mental Status: She is alert.  Psychiatric:        Mood and Affect: Mood normal.      UC Treatments / Results  Labs (all labs ordered are listed, but only abnormal results are displayed) Labs Reviewed  SARS CORONAVIRUS 2 (TAT 6-24 HRS)    EKG   Radiology No results found.  Procedures Procedures (including critical care time)  Medications Ordered in UC Medications - No data to display  Initial Impression / Assessment and Plan / UC Course  I have reviewed the triage vital signs and the nursing notes.  Pertinent labs & imaging results that were available during my care of the patient were reviewed by me and considered in my medical decision making (see chart for details).     Patient overall  well-appearing in no acute distress.  Lungs clear to auscultation.  Vitals within normal limits.  COVID test done today.  Patient with mild cough.  Consider Paxlovid if test is positive.  Supportive care discussed.  Tessalon prescribed. Final Clinical Impressions(s) / UC Diagnoses   Final diagnoses:  Acute cough     Discharge Instructions      Will call with test results Recommend over the counter Delsym for cough as needed Can take Tessalon as needed for cough Drink plenty of fluids, rest If you develop congestion recommend Flonase and Mucinex     ED Prescriptions     Medication Sig Dispense Auth. Provider   benzonatate (TESSALON) 100 MG capsule Take 1 capsule (100 mg total) by mouth every 8 (eight) hours. 21 capsule Ward, Tylene Fantasia, PA-C      PDMP not reviewed this encounter.   Ward, Polina Mcguffie, PA-C 11/08/22 [phone]

## 2022-11-09 ENCOUNTER — Telehealth: Payer: Self-pay

## 2022-11-09 LAB — SARS CORONAVIRUS 2 (TAT 6-24 HRS): SARS Coronavirus 2: NEGATIVE

## 2022-11-09 NOTE — Telephone Encounter (Signed)
Boneta Lucks- speech therapist from [facility] home health calls nurse line requesting medical social worker evaluation.   Verbal orders given per [facility] protocol  Veronda Prude, RN

## 2022-11-11 ENCOUNTER — Other Ambulatory Visit (HOSPITAL_COMMUNITY): Payer: Self-pay | Admitting: Interventional Radiology

## 2022-11-11 ENCOUNTER — Telehealth (HOSPITAL_COMMUNITY): Payer: Self-pay

## 2022-11-11 ENCOUNTER — Encounter: Payer: Self-pay | Admitting: Student

## 2022-11-11 ENCOUNTER — Ambulatory Visit (INDEPENDENT_AMBULATORY_CARE_PROVIDER_SITE_OTHER): Payer: BLUE CROSS/BLUE SHIELD | Admitting: Student

## 2022-11-11 ENCOUNTER — Other Ambulatory Visit (HOSPITAL_COMMUNITY): Payer: Self-pay | Admitting: Family Medicine

## 2022-11-11 VITALS — BP 126/68 | HR 75 | Wt 186.0 lb

## 2022-11-11 DIAGNOSIS — N172 Acute kidney failure with medullary necrosis: Secondary | ICD-10-CM

## 2022-11-11 DIAGNOSIS — R319 Hematuria, unspecified: Secondary | ICD-10-CM

## 2022-11-11 DIAGNOSIS — I611 Nontraumatic intracerebral hemorrhage in hemisphere, cortical: Secondary | ICD-10-CM

## 2022-11-11 LAB — POCT URINALYSIS DIP (MANUAL ENTRY)
Bilirubin, UA: NEGATIVE
Glucose, UA: NEGATIVE mg/dL
Nitrite, UA: POSITIVE — AB
Protein Ur, POC: NEGATIVE mg/dL
Spec Grav, UA: 1.015
Urobilinogen, UA: 0.2 U/dL
pH, UA: 6

## 2022-11-11 LAB — POCT UA - MICROSCOPIC ONLY: WBC, Ur, HPF, POC: >20 (ref 0–5)

## 2022-11-11 NOTE — Patient Instructions (Addendum)
It was great to see you today! Thank you for choosing [facility] for your primary care.  Today we addressed:  We will check some labs today   Please return to Dr. Kathrene Bongo as well for yearly follow up   To have Gtube removed  [facility] [address] Ph: [phone]--[facility] to schedule Fax:  [phone]  If you haven't already, sign up for My Chart to have easy access to your labs results, and communication with your primary care physician. We are checking some labs today. If they are abnormal, I will call you. If they are normal, I will send you a MyChart message (if it is active) or a letter in the mail. If you do not hear about your labs in the next 2 weeks, please call the office. I recommend that you always bring your medications to each appointment as this makes it easy to ensure you are on the correct medications and helps [place] not miss refills when you need them. Call the clinic at [phone] if your symptoms worsen or you have any concerns. Return in about 4 weeks (around 12/09/2022). Please arrive 15 minutes before your appointment to ensure smooth check in process.  We appreciate your efforts in making this happen.  Thank you for allowing me to participate in your care, Alfredo Martinez, MD 11/11/2022, 9:12 AM PGY-3, [facility]

## 2022-11-11 NOTE — Assessment & Plan Note (Signed)
History of hematuria and multiple urinary tract infections.  She is well-established with nephrology, last visit was in 2023.  She is due for another visit.  Provided the patient with information for Dr. Kathrene Bongo with [place] [facility].  Additionally, will obtain a UA/urine protein creatinine ratio as well given history of swelling in bilateral lower extremities.  Also obtaining CBC to check for anemia in the presence of hematuria and decreased energy level.  Return in 4 weeks to see PCP.

## 2022-11-11 NOTE — Telephone Encounter (Signed)
Called to schedule diagnostic angiogram, no answer, left vm. AB  

## 2022-11-11 NOTE — Progress Notes (Signed)
  SUBJECTIVE:   CHIEF COMPLAINT / HPI:   Hematuria: Noted on last visit while trying to give a urine sample.  H/o renal papillary necrosis and was following nephrology with Dr. Kathrene Bongo  For this, patient was instructed to remain hydrated No fever, chills, urinary frequency, flank pain Reports a few episodes of hematuria since last visit Family member in the room reports of decreased energy in patient.    PERTINENT  PMH / PSH:  Diverticulosis Sickle cell trait Prediabetes Recent hemorrhagic stroke requiring ICU stay Depression Anemia   Patient Care Team: Vonna Drafts, MD as PCP - General (Family Medicine) OBJECTIVE:  BP 126/68   Pulse 75   Wt 186 lb (84.4 kg)   SpO2 96%   BMI 29.13 kg/m  Physical Exam   General: NAD, pleasant, able to participate in exam Card: RRR, no m/g/r Respiratory: No respiratory distress Skin: warm and dry, no rashes noted Psych: Normal affect and mood   ASSESSMENT/PLAN:  Renal papillary necrosis (HCC) -     POCT urinalysis dipstick -     POCT UA - Microscopic Only -     Protein / creatinine ratio, urine -     CBC with Differential/Platelet -     POCT urinalysis dipstick  Hematuria, unspecified type Assessment & Plan: History of hematuria and multiple urinary tract infections.  She is well-established with nephrology, last visit was in 2023.  She is due for another visit.  Provided the patient with information for Dr. Kathrene Bongo with [place] [facility].  Additionally, will obtain a UA/urine protein creatinine ratio as well given history of swelling in bilateral lower extremities.  Also obtaining CBC to check for anemia in the presence of hematuria and decreased energy level.  Return in 4 weeks to see PCP.   Provided the patient with phone number for G-tube removal on AVS.  Return in about 4 weeks (around 12/09/2022). Alfredo Martinez, MD 11/11/2022, 9:51 AM PGY-3, [facility]

## 2022-11-12 LAB — CBC WITH DIFFERENTIAL/PLATELET
Basophils Absolute: 0 10*3/uL (ref 0.0–0.2)
Basos: 1 %
EOS (ABSOLUTE): 0.1 10*3/uL (ref 0.0–0.4)
Eos: 2 %
Hematocrit: 39.9 % (ref 34.0–46.6)
Hemoglobin: 12.2 g/dL (ref 11.1–15.9)
Immature Grans (Abs): 0 10*3/uL (ref 0.0–0.1)
Immature Granulocytes: 0 %
Lymphocytes Absolute: 2.1 10*3/uL (ref 0.7–3.1)
Lymphs: 35 %
MCH: 27 pg (ref 26.6–33.0)
MCHC: 30.6 g/dL — ABNORMAL LOW (ref 31.5–35.7)
MCV: 88 fL (ref 79–97)
Monocytes Absolute: 0.4 10*3/uL (ref 0.1–0.9)
Monocytes: 7 %
Neutrophils Absolute: 3.3 10*3/uL (ref 1.4–7.0)
Neutrophils: 55 %
Platelets: 241 10*3/uL (ref 150–450)
RBC: 4.52 x10E6/uL (ref 3.77–5.28)
RDW: 16.5 % — ABNORMAL HIGH (ref 11.7–15.4)
WBC: 6 10*3/uL (ref 3.4–10.8)

## 2022-11-12 LAB — PROTEIN / CREATININE RATIO, URINE
Creatinine, Urine: 130.9 mg/dL
Protein, Ur: 14.2 mg/dL
Protein/Creat Ratio: 108 mg/g{creat} (ref 0–200)

## 2022-11-13 ENCOUNTER — Other Ambulatory Visit: Payer: Self-pay | Admitting: Student

## 2022-11-13 DIAGNOSIS — N3001 Acute cystitis with hematuria: Secondary | ICD-10-CM

## 2022-11-13 MED ORDER — CEPHALEXIN 500 MG PO CAPS
500.0000 mg | ORAL_CAPSULE | Freq: Two times a day (BID) | ORAL | 0 refills | Status: AC
Start: 2022-11-13 — End: 2022-11-20

## 2022-11-18 ENCOUNTER — Encounter: Payer: Self-pay | Admitting: Student

## 2022-11-19 ENCOUNTER — Ambulatory Visit (HOSPITAL_COMMUNITY)
Admission: RE | Admit: 2022-11-19 | Discharge: 2022-11-19 | Disposition: A | Discharge status: Home / Self Care | Payer: BLUE CROSS/BLUE SHIELD | Source: Ambulatory Visit | Attending: Family Medicine | Admitting: Family Medicine

## 2022-11-19 DIAGNOSIS — Z431 Encounter for attention to gastrostomy: Secondary | ICD-10-CM | POA: Diagnosis present

## 2022-11-19 DIAGNOSIS — I611 Nontraumatic intracerebral hemorrhage in hemisphere, cortical: Secondary | ICD-10-CM | POA: Diagnosis not present

## 2022-11-19 HISTORY — PX: IR GASTROSTOMY TUBE REMOVAL: IMG5492

## 2022-11-19 MED ORDER — LIDOCAINE VISCOUS HCL 2 % MT SOLN
OROMUCOSAL | Status: AC
  Filled 2022-11-19: qty 15

## 2022-12-01 ENCOUNTER — Ambulatory Visit: Payer: BLUE CROSS/BLUE SHIELD | Admitting: Physical Therapy

## 2023-01-05 ENCOUNTER — Encounter (HOSPITAL_COMMUNITY): Payer: Self-pay | Admitting: *Deleted

## 2023-01-05 ENCOUNTER — Telehealth: Payer: Self-pay

## 2023-01-05 ENCOUNTER — Other Ambulatory Visit: Payer: Self-pay

## 2023-01-05 ENCOUNTER — Emergency Department (HOSPITAL_COMMUNITY)
Admission: EM | Admit: 2023-01-05 | Discharge: 2023-01-05 | Disposition: A | Discharge status: Home / Self Care | Payer: BLUE CROSS/BLUE SHIELD | Attending: Emergency Medicine | Admitting: Emergency Medicine

## 2023-01-05 DIAGNOSIS — F4321 Adjustment disorder with depressed mood: Secondary | ICD-10-CM | POA: Diagnosis not present

## 2023-01-05 DIAGNOSIS — N390 Urinary tract infection, site not specified: Secondary | ICD-10-CM | POA: Diagnosis not present

## 2023-01-05 DIAGNOSIS — B9689 Other specified bacterial agents as the cause of diseases classified elsewhere: Secondary | ICD-10-CM | POA: Diagnosis not present

## 2023-01-05 DIAGNOSIS — R1030 Lower abdominal pain, unspecified: Secondary | ICD-10-CM | POA: Diagnosis present

## 2023-01-05 HISTORY — DX: Cerebral infarction, unspecified: I63.9

## 2023-01-05 LAB — URINALYSIS, ROUTINE W REFLEX MICROSCOPIC
Bilirubin Urine: NEGATIVE
Glucose, UA: NEGATIVE mg/dL
Ketones, ur: NEGATIVE mg/dL
Nitrite: POSITIVE — AB
Protein, ur: NEGATIVE mg/dL
Specific Gravity, Urine: 1.01 (ref 1.005–1.030)
WBC, UA: >50 WBC/hpf (ref 0–5)
pH: 6 (ref 5.0–8.0)

## 2023-01-05 LAB — COMPREHENSIVE METABOLIC PANEL
ALT: 15 U/L (ref 0–44)
AST: 18 U/L (ref 15–41)
Albumin: 4 g/dL (ref 3.5–5.0)
Alkaline Phosphatase: 87 U/L (ref 38–126)
Anion gap: 13 (ref 5–15)
BUN: 11 mg/dL (ref 8–23)
CO2: 25 mmol/L (ref 22–32)
Calcium: 9.5 mg/dL (ref 8.9–10.3)
Chloride: 104 mmol/L (ref 98–111)
Creatinine, Ser: 0.86 mg/dL (ref 0.44–1.00)
GFR, Estimated: >60 mL/min (ref >60)
Glucose, Bld: 87 mg/dL (ref 70–99)
Potassium: 3.5 mmol/L (ref 3.5–5.1)
Sodium: 142 mmol/L (ref 135–145)
Total Bilirubin: 0.7 mg/dL (ref 0.3–1.2)
Total Protein: 7.6 g/dL (ref 6.5–8.1)

## 2023-01-05 LAB — ACETAMINOPHEN LEVEL: Acetaminophen (Tylenol), Serum: <10 ug/mL — ABNORMAL LOW (ref 10–30)

## 2023-01-05 LAB — CBC
HCT: 41.8 % (ref 36.0–46.0)
Hemoglobin: 13.4 g/dL (ref 12.0–15.0)
MCH: 27.3 pg (ref 26.0–34.0)
MCHC: 32.1 g/dL (ref 30.0–36.0)
MCV: 85.1 fL (ref 80.0–100.0)
Platelets: 253 10*3/uL (ref 150–400)
RBC: 4.91 MIL/uL (ref 3.87–5.11)
RDW: 16.7 % — ABNORMAL HIGH (ref 11.5–15.5)
WBC: 8.9 10*3/uL (ref 4.0–10.5)
nRBC: 0 % (ref 0.0–0.2)

## 2023-01-05 LAB — ETHANOL: Alcohol, Ethyl (B): <10 mg/dL (ref <10)

## 2023-01-05 LAB — SALICYLATE LEVEL: Salicylate Lvl: <7 mg/dL — ABNORMAL LOW (ref 7.0–30.0)

## 2023-01-05 MED ORDER — SODIUM CHLORIDE 0.9 % IV SOLN
1.0000 g | Freq: Once | INTRAVENOUS | Status: AC
  Administered 2023-01-05: 1 g via INTRAVENOUS
  Filled 2023-01-05: qty 10

## 2023-01-05 MED ORDER — PHENAZOPYRIDINE HCL 200 MG PO TABS: 200.0000 mg | ORAL_TABLET | Freq: Three times a day (TID) | ORAL | 0 refills | Status: DC

## 2023-01-05 MED ORDER — PHENAZOPYRIDINE HCL 100 MG PO TABS
200.0000 mg | ORAL_TABLET | Freq: Once | ORAL | Status: AC
  Administered 2023-01-05: 200 mg via ORAL
  Filled 2023-01-05: qty 2

## 2023-01-05 MED ORDER — CEPHALEXIN 500 MG PO CAPS: 500.0000 mg | ORAL_CAPSULE | Freq: Four times a day (QID) | ORAL | 0 refills | Status: AC

## 2023-01-05 NOTE — Discharge Instructions (Addendum)
Your workup today was consistent with a urinary tract infection.  Antibiotic and Pyridium sent into the pharmacy.  For any concerning symptoms return to the emergency room for evaluation.  Behavioral Health Resources:  Intensive Outpatient Programs: [facility]      [address] [place], [place] [phone] Both a day and evening program       [facility]     [address]        [place], [place] [address] [phone]         ADS: [facility] [address] [place] [phone]  [facility] Mental Health ACCESS LINE: [phone] or [phone] [address] [place], [place] [address] [email or web address]   Substance Abuse Resources: [facility]  [phone] [facility] [phone] [facility] [place] [phone] Floydene Flock [phone] Residential & Outpatient Substance Abuse Program  [phone]  Psychological Services: [facility]  [phone] [facility]  [phone] [facility], [phone] [place]. [address] [place], ACCESS LINE: [phone] or [phone], [email or web address]  Mobile Crisis Teams:                                        Therapeutic Alternatives         Mobile Crisis Care Unit [phone]             [facility] [address] [place]in[place] Otto [phone]                                         [facility] [address] [place] [address] [place] [phone]  Self-Help/Support Groups: Mental Health Assoc[facility] of [employer] of support groups [phone] (call for more info)  Narcotics Anonymous (NA) [facility] [address] [place] - 2 meetings at this location  Residential Treatment Programs:  [facility]      [address]        [place]  [place]       [phone]         [facility] [address], [place] [address] [place], [place]  [address] [phone]  [facility]  [address] [place], [place] [address] [phone] Admissions: 8am-3pm M-F  [facility]     [address]        [place], [place] [address]       [phone]         [facility] [address] [place], [place] [phone]  [facility] [address] [place], [place] [phone]  [facility] - Intensive Outpatient      [address]     [place], [place]       [phone]         [facility] ([facility]     [address] [place], [place] [phone] or [phone]  Residential Treatment Services (RTS), Medicaid [address] [place], [place] [phone]  [facility]  [address] [place] [phone]  [facility] Resources: [phone]               General Therapy                                                Angie Fava, PhD        [address] [place], [place] [address]         [phone]   Insurance  [facility]   [address] [place], [place] [address] [phone]  [facility] [address] [place], [place] [address] [phone] Insurance/Medicaid/sponsorship through [facility] and Families                                              [address]                                        [place], [place] [address]    Therapy/tele-psych/case         [phone]          [facility] [address], [place]  [address]  Adolescent/group home/case management [phone]                                           Creola Corn PhD       General therapy       Insurance   [phone]         Dr. Lolly Mustache, Insurance, M-F [phone]  [facility]  [employer] [facility] Dept. [address]                 [address]         [address] [place] [address]  Blondell Reveal Phone:  [phone]                                  Phone:  [phone]                   Phone:  [phone]  [facility], [phone] [facility]- [phone]       -     [facility] [place], [address]             [phone], Insurance

## 2023-01-05 NOTE — Consult Note (Signed)
BH ED ASSESSMENT   Reason for Consult:  SI Referring Physician:  Jarold Motto Patient Identification: Anna Berry MRN:  [id number] ED Chief Complaint: Adjustment disorder with depressed mood  Diagnosis:  Principal Problem:   Adjustment disorder with depressed mood   ED Assessment Time Calculation: Start Time: 1000 Stop Time: 1045 Total Time in Minutes (Assessment Completion): 45   HPI:   Anna Berry is a 62 y.o. female patient with reported hx of MDD who presented to [facility] due to vaginal pain, later to find she has a UTI. Pt receiving treatment for UTI while in ED. 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 Pt has most movement back but still struggling with fine motor skills. Pt requesting to speak with psychiatry.   Subjective:   Pt seen at [facility] for face to face psychiatric evaluation. Pt daughter and husband are in the room, however she request they leave during evaluation. Pt stated she did ask to speak with psychiatry, she has been feeling depressed x a few weeks. Pt stated she occasionally has passive SI, but denies any plans or intent. She denies any previous suicide attempts. She denies current suicidal ideations. Denies HI. Denies AVH. Pt does report having a hx of MDD, took an antidepressant "many many years ago" but has been off psychotropics for years. Pt does not feel like she needs to restart an antidepressant at this time.   Essentially, pt stated since she had two strokes, and discharged from the hospital in June she has not been able to cook and clean around the house. Her husband will not allow her to cook or clean, and this has been causing her depression. She has asked her husband to be able to start helping around the house but he still does not let her. She has not been officially cleared by her doctors yet, and he is making her wait until she is cleared. She does mention she feels like all  of her mobility is back except for fine motor movements of hands. She is feeling depressed "not having anything to do and wanting to help." She said her husband has been doing all the cooking and cleaning.   Upon further discussion, we spoke about how her husband appears to care and think it is important for her to medically cleared before cooking/cleaning which is fair to want for a significant other. She understands he is doing this because he cares, and does not feel like it is in an abusive way. She has no intentions of wanting to hurt herself, and is wanting to discharge home today. She is declining wanting any psychotropic medications and declining inpatient treatment. We did talk about her acquiring a therapist which she agrees to look into.   Pt denies current SI/HI/AVH and does feel safe to discharge home today. She is alert and oriented. Speech is normal in rate and tone. Thought content intact. She does not appear to be RTIS, no concerns for psychosis or mania at this time. Will psych clear to discharge home.   Past Psychiatric History:  MDD, no previous psychiatric inpatient hosptializations  Risk to Self or Others: Is the patient at risk to self? No Has the patient been a risk to self in the past 6 months? No Has the patient been a risk to self within the distant past? No Is the patient a risk to others? No Has the patient been a risk to others  in the past 6 months? No Has the patient been a risk to others within the distant past? No  [place] Scale:  Flowsheet Row ED from 01/05/2023 in [facility] Emergency Department at [facility] ED from 11/08/2022 in [facility] ED to Hosp-Admission (Discharged) from 07/24/2022 in [facility]  C-SSRS RISK CATEGORY No Risk No Risk No Risk       AIMS:  , , ,  ,   ASAM:    Substance Abuse:     Past Medical History:  Past Medical History:  Diagnosis Date   Allergy    Anemia     Arthritis    Asthma    allergies induced   Bilateral lower extremity edema    Depression    Fibroids 08/2002   Gross hematuria 05/2010   History of appendicitis    History of colon polyps 2017   Multiple renal cysts 05/24/2008   Two left renal parapelvic cysts.   OA (osteoarthritis) of knee    Right   Obesity    Plantar fasciitis    Postmenopausal    Pre-diabetes    Renal papillary necrosis (HCC)    Seasonal allergies    Sickle cell anemia (HCC)    sickle cell trait   Sickle cell trait (HCC)    Sigmoid diverticulosis 09/26/2015   Noted on colonoscopy   Stroke [facility])     Past Surgical History:  Procedure Laterality Date   ABDOMINAL HYSTERECTOMY  2004   APPENDECTOMY  2006   CESAREAN SECTION  1981, 1987   x2   COLONOSCOPY     COLONOSCOPY W/ POLYPECTOMY  09/26/2015   IR GASTROSTOMY TUBE MOD SED  08/12/2022   IR GASTROSTOMY TUBE REMOVAL  11/19/2022   POLYPECTOMY     TOTAL KNEE ARTHROPLASTY Right 07/29/2018   Procedure: RIGHT TOTAL KNEE ARTHROPLASTY;  Surgeon: Tarry Kos, MD;  Location: [facility];  Service: Orthopedics;  Laterality: Right;   Family History:  Family History  Problem Relation Age of Onset   Diabetes Father    Kidney disease Father    Stomach cancer Maternal Grandmother 49   Stroke Maternal Grandmother    Colon cancer Other    Colon polyps Neg Hx    Esophageal cancer Neg Hx    Rectal cancer Neg Hx     Social History:  Social History   Substance and Sexual Activity  Alcohol Use No   Alcohol/week: 0.0 standard drinks of alcohol     Social History   Substance and Sexual Activity  Drug Use No    Social History   Socioeconomic History   Marital status: Married    Spouse name: Not on file   Number of children: Not on file   Years of education: Not on file   Highest education level: Not on file  Occupational History   Not on file  Tobacco Use   Smoking status: Never    Passive exposure: Never   Smokeless tobacco: Never  Vaping Use   Vaping  status: Never Used  Substance and Sexual Activity   Alcohol use: No    Alcohol/week: 0.0 standard drinks of alcohol   Drug use: No   Sexual activity: Yes    Birth control/protection: Surgical  Other Topics Concern   Not on file  Social History Narrative   Not on file   Social Determinants of Health   Financial Resource Strain: Not on file  Food Insecurity: Patient Unable  To Answer (07/25/2022)   Hunger Vital Sign    Worried About Running Out of Food in the Last Year: Patient unable to answer    Ran Out of Food in the Last Year: Patient unable to answer  Transportation Needs: Patient Unable To Answer (07/25/2022)   PRAPARE - Transportation    Lack of Transportation (Medical): Patient unable to answer    Lack of Transportation (Non-Medical): Patient unable to answer  Physical Activity: Not on file  Stress: Not on file  Social Connections: Not on file   Additional Social History:    Allergies:   Allergies  Allergen Reactions   Acetaminophen     Renal papillary necrosis in high doses   Morphine And Codeine Nausea And Vomiting   Nsaids     Renal papillary necrosis   Other Itching and Swelling    Tree nuts    Labs:  Results for orders placed or performed during the hospital encounter of 01/05/23 (from the past 48 hour(s))  Urinalysis, Routine w reflex microscopic -Urine, Clean Catch     Status: Abnormal   Collection Time: 01/05/23  3:55 AM  Result Value Ref Range   Color, Urine YELLOW YELLOW   APPearance HAZY (A) CLEAR   Specific Gravity, Urine 1.010 1.005 - 1.030   pH 6.0 5.0 - 8.0   Glucose, UA NEGATIVE NEGATIVE mg/dL   Hgb urine dipstick SMALL (A) NEGATIVE   Bilirubin Urine NEGATIVE NEGATIVE   Ketones, ur NEGATIVE NEGATIVE mg/dL   Protein, ur NEGATIVE NEGATIVE mg/dL   Nitrite POSITIVE (A) NEGATIVE   Leukocytes,Ua LARGE (A) NEGATIVE   RBC / HPF 0-5 0 - 5 RBC/hpf   WBC, UA >50 0 - 5 WBC/hpf   Bacteria, UA MANY (A) NONE SEEN   Squamous Epithelial / HPF 6-10 0 - 5  /HPF   Mucus PRESENT     Comment: Performed at [facility], [address] [place], [place] [address]  Comprehensive metabolic panel     Status: None   Collection Time: 01/05/23  4:00 AM  Result Value Ref Range   Sodium 142 135 - 145 mmol/L   Potassium 3.5 3.5 - 5.1 mmol/L   Chloride 104 98 - 111 mmol/L   CO2 25 22 - 32 mmol/L   Glucose, Bld 87 70 - 99 mg/dL    Comment: Glucose reference range applies only to samples taken after fasting for at least 8 hours.   BUN 11 8 - 23 mg/dL   Creatinine, Ser 7.84 0.44 - 1.00 mg/dL   Calcium 9.5 8.9 - 69.6 mg/dL   Total Protein 7.6 6.5 - 8.1 g/dL   Albumin 4.0 3.5 - 5.0 g/dL   AST 18 15 - 41 U/L   ALT 15 0 - 44 U/L   Alkaline Phosphatase 87 38 - 126 U/L   Total Bilirubin 0.7 0.3 - 1.2 mg/dL   GFR, Estimated >29 >52 mL/min    Comment: (NOTE) Calculated using the CKD-EPI Creatinine Equation (2021)    Anion gap 13 5 - 15    Comment: Performed at [facility], [address] [place], [place] [address]  CBC     Status: Abnormal   Collection Time: 01/05/23  4:00 AM  Result Value Ref Range   WBC 8.9 4.0 - 10.5 K/uL   RBC 4.91 3.87 - 5.11 MIL/uL   Hemoglobin 13.4 12.0 - 15.0 g/dL   HCT 44.0 10.2 - 72.5 %   MCV 85.1 80.0 - 100.0 fL  MCH 27.3 26.0 - 34.0 pg   MCHC 32.1 30.0 - 36.0 g/dL   RDW 16.1 (H) 09.6 - 04.5 %   Platelets 253 150 - 400 K/uL   nRBC 0.0 0.0 - 0.2 %    Comment: Performed at [facility], [address], [place], [place] [address]  Acetaminophen level     Status: Abnormal   Collection Time: 01/05/23  8:30 AM  Result Value Ref Range   Acetaminophen (Tylenol), Serum <10 (L) 10 - 30 ug/mL    Comment: (NOTE) Therapeutic concentrations vary significantly. A range of 10-30 ug/mL  may be an effective concentration for many patients. However, some  are best treated at concentrations outside of this range. Acetaminophen concentrations >150 ug/mL at 4 hours after ingestion  and >50 ug/mL at 12 hours after  ingestion are often associated with  toxic reactions.  Performed at [facility], [address], [place], [place] [address]   Ethanol     Status: None   Collection Time: 01/05/23  8:30 AM  Result Value Ref Range   Alcohol, Ethyl (B) <10 <10 mg/dL    Comment: (NOTE) Lowest detectable limit for serum alcohol is 10 mg/dL.  For medical purposes only. Performed at [facility], [address] [place], [place] [address]   Salicylate level     Status: Abnormal   Collection Time: 01/05/23  8:30 AM  Result Value Ref Range   Salicylate Lvl <7.0 (L) 7.0 - 30.0 mg/dL    Comment: Performed at [facility], [address] [place], [place] [address]    No current facility-administered medications for this encounter.   Current Outpatient Medications  Medication Sig Dispense Refill   acetaminophen (TYLENOL) 500 MG tablet Take 1,000 mg by mouth daily as needed for moderate pain.     albuterol (VENTOLIN HFA) 108 (90 Base) MCG/ACT inhaler Inhale 2 puffs into the lungs every 6 (six) hours as needed for wheezing or shortness of breath. 6.7 g 0   benzonatate (TESSALON) 100 MG capsule Take 1 capsule (100 mg total) by mouth every 8 (eight) hours. 21 capsule 0   famotidine (PEPCID) 20 MG tablet Place 1 tablet (20 mg total) into feeding tube daily. 30 tablet 0   feeding supplement (ENSURE ENLIVE / ENSURE PLUS) LIQD Take 237 mLs by mouth 2 (two) times daily between meals. 237 mL 12   loratadine (CLARITIN) 10 MG tablet Take 10 mg by mouth daily as needed for allergies.      Nutritional Supplements (FEEDING SUPPLEMENT, GLUCERNA 1.5 CAL,) LIQD Place 1,000 mLs into feeding tube daily.     polyethylene glycol powder (GLYCOLAX/MIRALAX) 17 GM/SCOOP powder Take 17 g by mouth 2 (two) times daily as needed for mild constipation.     rosuvastatin (CRESTOR) 20 MG tablet Take 1 tablet (20 mg total) by mouth daily. 90 tablet 3   sennosides (SENOKOT) 8.8 MG/5ML syrup Place 5 mLs into feeding tube at  bedtime as needed for mild constipation. 240 mL 0   Tetrahydrozoline HCl (VISINE OP) Place 1 drop into both eyes daily as needed (allergies).     Water For Irrigation, Sterile (FREE WATER) SOLN Place 200 mLs into feeding tube every 4 (four) hours.     white petrolatum (VASELINE) OINT Apply 1 Application topically as needed for lip care.  0    Psychiatric Specialty Exam: Presentation  General Appearance:  Appropriate for Environment  Eye Contact: Good  Speech: Clear and Coherent  Speech Volume: Normal  Handedness:No data recorded  Mood and Affect  Mood: Depressed  Affect: Congruent   Thought Process  Thought Processes: Coherent  Descriptions of Associations:Intact  Orientation:Full (Time, Place and [place])  Thought Content:WDL  History of Schizophrenia/Schizoaffective disorder:No data recorded Duration of Psychotic Symptoms:No data recorded Hallucinations:Hallucinations: None  Ideas of Reference:None  Suicidal Thoughts:Suicidal Thoughts: No  Homicidal Thoughts:Homicidal Thoughts: No   Sensorium  Memory: Immediate Fair; Recent Fair  Judgment: Good  Insight: Good   Executive Functions  Concentration: Good  Attention Span: Good  Recall: Good  Fund of Knowledge: Good  Language: Good   Psychomotor Activity  Psychomotor Activity: Psychomotor Activity: Normal   Assets  Assets: Desire for Improvement; Resilience; Housing; Leisure Time; Communication Skills    Sleep  Sleep: Sleep: Good   Physical Exam: Physical Exam Neurological:     Mental Status: She is alert and oriented to [place], place, and time.  Psychiatric:        Attention and Perception: Attention normal.        Mood and Affect: Mood is depressed.        Speech: Speech normal.        Behavior: Behavior is cooperative.        Thought Content: Thought content normal.    Review of Systems  Psychiatric/Behavioral:  Positive for depression.   All other systems  reviewed and are negative.  Blood pressure (!) 163/78, pulse 63, temperature 98 F (36.7 C), resp. rate 16, SpO2 100%. There is no height or weight on file to calculate BMI.  Medical Decision Making: Pt case reviewed and discussed with Dr. Lucianne Muss. Pt does not meet criteria for IVC or IP treatment. Pt does feel safe to return home, will psych clear for discharge.   - resources provided in AVS to establish OP therapy  Disposition: No evidence of imminent risk to self or others at present.   Patient does not meet criteria for psychiatric inpatient admission. Supportive therapy provided about ongoing stressors. Discussed crisis plan, support from social network, calling 911, coming to the Emergency Department, and calling Suicide Hotline.  Eligha Bridegroom, NP 01/05/2023 11:32 AM

## 2023-01-05 NOTE — ED Triage Notes (Signed)
Pt arrives from home via [facility], per report, pt  c/o groin and vaginal pain, worse when standing up. Subsided. Hx of stroke. En route, 152/88, hr 72, 98% ra, 18rr.

## 2023-01-05 NOTE — ED Provider Notes (Signed)
[facility]ERGENC[facility] DEPARTMENT AT [facility] Provider Note   CSN: [id number] Arrival date & time: 01/05/23  6213     History  Chief Complaint  Patient presents with   Abdominal Pain    Anna Berry is a 62 y.o. female.  62 year old female presents today for concern of groin pain which is consistent with her previous UTIs.  She came in because she felt she might of had a UTI.  Also endorses urinary frequency.  No flank pain, weakness, hematuria, or fever.  The history is provided by the patient. No language interpreter was used.       Home Medications Prior to Admission medications   Medication Sig Start Date End Date Taking? Authorizing Provider  acetaminophen (TYLENOL) 500 MG tablet Take 1,000 mg by mouth daily as needed for moderate pain.    [provider]  albuterol (VENTOLIN HFA) 108 (90 Base) MCG/ACT inhaler Inhale 2 puffs into the lungs every 6 (six) hours as needed for wheezing or shortness of breath. 06/26/22   Maury Dus, MD  benzonatate (TESSALON) 100 MG capsule Take 1 capsule (100 mg total) by mouth every 8 (eight) hours. 11/08/22   Ward, Tylene Fantasia, PA-C  famotidine (PEPCID) 20 MG tablet Place 1 tablet (20 mg total) into feeding tube daily. 08/19/22   Kathlen Mody, MD  feeding supplement (ENSURE ENLIVE / ENSURE PLUS) LIQD Take 237 mLs by mouth 2 (two) times daily between meals. 08/14/22   Kathlen Mody, MD  loratadine (CLARITIN) 10 MG tablet Take 10 mg by mouth daily as needed for allergies.     [provider]  Nutritional Supplements (FEEDING SUPPLEMENT, GLUCERNA 1.5 CAL,) LIQD Place 1,000 mLs into feeding tube daily. 08/14/22   Kathlen Mody, MD  polyethylene glycol powder (GLYCOLAX/MIRALAX) 17 GM/SCOOP powder Take 17 g by mouth 2 (two) times daily as needed for mild constipation. 08/18/22   Kathlen Mody, MD  rosuvastatin (CRESTOR) 20 MG tablet Take 1 tablet (20 mg total) by mouth daily. 10/13/22   Vonna Drafts, MD  sennosides  (SENOKOT) 8.8 MG/5ML syrup Place 5 mLs into feeding tube at bedtime as needed for mild constipation. 08/18/22   Kathlen Mody, MD  Tetrahydrozoline HCl (VISINE OP) Place 1 drop into both eyes daily as needed (allergies).    [provider]  Water For Irrigation, Sterile (FREE WATER) SOLN Place 200 mLs into feeding tube every 4 (four) hours. 08/14/22   Kathlen Mody, MD  white petrolatum (VASELINE) OINT Apply 1 Application topically as needed for lip care. 08/14/22   Kathlen Mody, MD      Allergies    Acetaminophen, Morphine and codeine, Nsaids, and Other    Review of Systems   Review of Systems  Constitutional:  Negative for chills and fever.  Gastrointestinal:  Negative for abdominal pain, nausea and vomiting.  Genitourinary:  Positive for dysuria and frequency. Negative for flank pain.  All other systems reviewed and are negative.   Physical Exam Updated Vital Signs BP (!) 163/78   Pulse 63   Temp 98 F (36.7 C)   Resp 16   SpO2 100%  Physical Exam Vitals and nursing note reviewed.  Constitutional:      General: She is not in acute distress.    Appearance: Normal appearance. She is not ill-appearing.  HENT:     Head: Normocephalic and atraumatic.     Nose: Nose normal.  Eyes:     Conjunctiva/sclera: Conjunctivae normal.  Cardiovascular:     Rate  and Rhythm: Normal rate and regular rhythm.  Pulmonary:     Effort: Pulmonary effort is normal. No respiratory distress.     Breath sounds: Normal breath sounds. No wheezing.  Abdominal:     General: There is no distension.     Tenderness: There is no abdominal tenderness. There is no guarding.  Musculoskeletal:        General: No deformity. Normal range of motion.     Cervical back: Normal range of motion.  Skin:    Findings: No rash.  Neurological:     General: No focal deficit present.     Mental Status: She is alert and oriented to [place], place, and time. Mental status is at baseline.     Cranial Nerves: No  cranial nerve deficit.     Motor: No weakness.     ED Results / Procedures / Treatments   Labs (all labs ordered are listed, but only abnormal results are displayed) Labs Reviewed  CBC - Abnormal; Notable for the following components:      Result Value   RDW 16.7 (*)    All other components within normal limits  URINALYSIS, ROUTINE W REFLEX MICROSCOPIC - Abnormal; Notable for the following components:   APPearance HAZY (*)    Hgb urine dipstick SMALL (*)    Nitrite POSITIVE (*)    Leukocytes,Ua LARGE (*)    Bacteria, UA MANY (*)    All other components within normal limits  ACETAMINOPHEN LEVEL - Abnormal; Notable for the following components:   Acetaminophen (Tylenol), Serum <10 (*)    All other components within normal limits  SALICYLATE LEVEL - Abnormal; Notable for the following components:   Salicylate Lvl <7.0 (*)    All other components within normal limits  COMPREHENSIVE METABOLIC PANEL  ETHANOL  RAPID URINE DRUG SCREEN, HOSP PERFORMED    EKG None  Radiology No results found.  Procedures Procedures    Medications Ordered in ED Medications  cefTRIAXone (ROCEPHIN) 1 g in sodium chloride 0.9 % 100 mL IVPB (0 g Intravenous Stopped 01/05/23 1005)  phenazopyridine (PYRIDIUM) tablet 200 mg (200 mg Oral Given 01/05/23 1610)    ED Course/ Medical Decision Making/ A&P                                 Medical Decision Making Amount and/or Complexity of Data Reviewed Labs: ordered.  Risk Prescription drug management.   62 year old female presents today with groin pain.  States consistent with her UTIs in the past.  Family is at bedside.  They also added that she has been more depressed and is suicidal.  He is hemodynamically stable.  Vitals are stable.  CBC without leukocytosis or anemia.  CMP unremarkable.  UA with evidence of UTI.  Will give dose of Rocephin and Pyridium.  Will place TTS consult and add psych eval labs.  Acetaminophen, ethanol, salicylate  levels within normal.  EKG without acute ischemic changes.  TTS evaluation completed.  Cleared for discharge from their standpoint.  Will discharge patient on Keflex.  Will send urine culture.  Will give prescription for Pyridium.  They are in agreement with plan.  Admission considered however patient stable and would benefit with close PCP follow-up.  Final Clinical Impression(s) / ED Diagnoses Final diagnoses:  Lower urinary tract infectious disease    Rx / DC Orders ED Discharge Orders          Ordered  cephALEXin (KEFLEX) 500 MG capsule  4 times daily        01/05/23 1215    phenazopyridine (PYRIDIUM) 200 MG tablet  3 times daily        01/05/23 1215              Marita Kansas, PA-C 01/05/23 1216    Gerhard Munch, MD 01/06/23 [phone]

## 2023-01-05 NOTE — Telephone Encounter (Signed)
Patients husband calls nurse line requesting an apt.   He reports she was seen in the emergency department earlier this morning for a UTI. However, shortly after they got home she noticed her legs were swollen and painful. He reports it is hard for her to walk. Patient has not started the medications prescribed to her for UTI yet, as they will not be ready until after 3pm.  He denies any SOB or chest pains. Denies any oozing skin. No warmth.   He is unsure if this is a complication of the UTI.  Patient scheduled for tomorrow for evaluation as we have no more apts left for today.   ED precautions discussed.

## 2023-01-05 NOTE — ED Triage Notes (Signed)
Pt reporting that vaginal pain woke her up from her sleep tonight. She has had a similar pain when she had a bladder infection. Has had frequent urination. Denies recent fever, vaginal bleeding or discharge.

## 2023-01-06 ENCOUNTER — Ambulatory Visit: Payer: BLUE CROSS/BLUE SHIELD

## 2023-01-06 NOTE — Progress Notes (Deleted)
  SUBJECTIVE:   CHIEF COMPLAINT / HPI:   Presents today for lower extremity swelling.  Of note, she was treated with Keflex after seeing Dr. Jena Gauss late August for UTI.  She again had UA consistent with UTI after being seen in ED yesterday.  She was given dose of Rocephin and Pyridium and discharged with Keflex.  PERTINENT  PMH / PSH: Diverticulosis, sickle cell trait, prediabetes, hemorrhagic stroke  OBJECTIVE:  There were no vitals taken for this visit. Physical Exam   ASSESSMENT/PLAN:  There are no diagnoses linked to this encounter. No follow-ups on file. Shelby Mattocks, DO 01/06/2023, 7:38 AM PGY-3, [facility] {    This will disappear when note is signed, click to select method of visit    :1}

## 2023-01-07 ENCOUNTER — Ambulatory Visit (HOSPITAL_COMMUNITY)
Admission: EM | Admit: 2023-01-07 | Discharge: 2023-01-07 | Disposition: A | Discharge status: Home / Self Care | Payer: BLUE CROSS/BLUE SHIELD | Attending: Internal Medicine | Admitting: Internal Medicine

## 2023-01-07 ENCOUNTER — Encounter (HOSPITAL_COMMUNITY): Payer: Self-pay

## 2023-01-07 DIAGNOSIS — N390 Urinary tract infection, site not specified: Secondary | ICD-10-CM | POA: Diagnosis present

## 2023-01-07 LAB — POCT URINALYSIS DIP (MANUAL ENTRY)
Blood, UA: NEGATIVE
Glucose, UA: 250 mg/dL — AB
Nitrite, UA: POSITIVE — AB
Protein Ur, POC: 100 mg/dL — AB
Spec Grav, UA: <=1.005 — AB (ref 1.010–1.025)
Urobilinogen, UA: 4 U/dL — AB
pH, UA: 5.5 (ref 5.0–8.0)

## 2023-01-07 NOTE — Discharge Instructions (Addendum)
Continue your current medications Drink plenty of fluids Follow-up with your doctor Monday To the emergency department for development of fever, vomiting, worsening pain, concerns  Test results will be released to your MyChart account We will contact you if anything is positive and requires treatment.

## 2023-01-07 NOTE — ED Triage Notes (Signed)
Vaginal Pain; UTI SXS onset a few weeks ago. Patient not having any pain or blood with urination. Patient went to the ER via ambulance the other night for a uti. Patient was given abx in the hospital and pain med. Symptoms have not resolved.   Patient now having a hard time walking due to groin and abdominal pain. Patient did trip last Monday at her grandchild's game.

## 2023-01-07 NOTE — ED Provider Notes (Signed)
[facility]    CSN: [id number] Arrival date & time: 01/07/23  1150      History   Chief Complaint Chief Complaint  Patient presents with   Urinary Tract Infection   Vaginal Pain    HPI Desi Fred Viegas is a 62 y.o. female.   The history is provided by the patient and a relative.  Urinary Tract Infection Associated symptoms: abdominal pain (lower abdomen)   Associated symptoms: no fever, no nausea, no vaginal discharge and no vomiting   Vaginal Pain Associated symptoms include abdominal pain (lower abdomen).  I being treated for urinary tract infection, continues to have lower abdominal pain that radiates to her thighs, admits dysuria, frequency.  Admits change in color of urine.  Denies fever, chills, sweats. Admits pain both groin and thighs, had a trip 5 days ago but did not fall.  Since then has difficulty walking secondary to pain in her thighs and groin   Past Medical History:  Diagnosis Date   Allergy    Anemia    Arthritis    Asthma    allergies induced   Bilateral lower extremity edema    Depression    Fibroids 08/2002   Gross hematuria 05/2010   History of appendicitis    History of colon polyps 2017   Multiple renal cysts 05/24/2008   Two left renal parapelvic cysts.   OA (osteoarthritis) of knee    Right   Obesity    Plantar fasciitis    Postmenopausal    Pre-diabetes    Renal papillary necrosis (HCC)    Seasonal allergies    Sickle cell anemia (HCC)    sickle cell trait   Sickle cell trait (HCC)    Sigmoid diverticulosis 09/26/2015   Noted on colonoscopy   Stroke [facility])     Patient Active Problem List   Diagnosis Date Noted   Adjustment disorder with depressed mood 01/05/2023   Ankle swelling 11/06/2022   ICH (intracerebral hemorrhage) (HCC) 07/25/2022   H/O: hysterectomy 09/12/2021   Acute cystitis with hematuria 06/30/2019   History of total knee replacement, 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   Obesity (BMI 30-39.9) 05/11/2012   Renal  papillary necrosis (HCC) 08/29/2010   Asthma 12/29/2009   Hematuria 05/21/2008   ALLERGIC RHINITIS 05/29/2007   SICKLE CELL TRAIT 05/13/2006    Past Surgical History:  Procedure Laterality Date   ABDOMINAL HYSTERECTOMY  2004   APPENDECTOMY  2006   CESAREAN SECTION  1981, 1987   x2   COLONOSCOPY     COLONOSCOPY W/ POLYPECTOMY  09/26/2015   IR GASTROSTOMY TUBE MOD SED  08/12/2022   IR GASTROSTOMY TUBE REMOVAL  11/19/2022   POLYPECTOMY     TOTAL KNEE ARTHROPLASTY Right 07/29/2018   Procedure: RIGHT TOTAL KNEE ARTHROPLASTY;  Surgeon: Tarry Kos, MD;  Location: [facility];  Service: Orthopedics;  Laterality: Right;    OB History   No obstetric history on file.      Home Medications    Prior to Admission medications   Medication Sig Start Date End Date Taking? Authorizing Provider  acetaminophen (TYLENOL) 500 MG tablet Take 1,000 mg by mouth daily as needed for moderate pain.   Yes [provider]  albuterol (VENTOLIN HFA) 108 (90 Base) MCG/ACT inhaler Inhale 2 puffs into the lungs every 6 (six) hours as needed for wheezing or shortness of breath. 06/26/22  Yes Maury Dus, MD  benzonatate (TESSALON) 100 MG capsule Take 1 capsule (100 mg total) by mouth every 8 (  eight) hours. 11/08/22  Yes Ward, Tylene Fantasia, PA-C  cephALEXin (KEFLEX) 500 MG capsule Take 1 capsule (500 mg total) by mouth 4 (four) times daily for 7 days. 01/05/23 01/12/23 Yes Ali, Amjad, PA-C  famotidine (PEPCID) 20 MG tablet Place 1 tablet (20 mg total) into feeding tube daily. 08/19/22  Yes Kathlen Mody, MD  feeding supplement (ENSURE ENLIVE / ENSURE PLUS) LIQD Take 237 mLs by mouth 2 (two) times daily between meals. 08/14/22  Yes Kathlen Mody, MD  loratadine (CLARITIN) 10 MG tablet Take 10 mg by mouth daily as needed for allergies.    Yes [provider]  Nutritional Supplements (FEEDING SUPPLEMENT, GLUCERNA 1.5 CAL,) LIQD Place 1,000 mLs into feeding tube daily. 08/14/22  Yes Kathlen Mody, MD   phenazopyridine (PYRIDIUM) 200 MG tablet Take 1 tablet (200 mg total) by mouth 3 (three) times daily. 01/05/23  Yes Ali, Amjad, PA-C  polyethylene glycol powder (GLYCOLAX/MIRALAX) 17 GM/SCOOP powder Take 17 g by mouth 2 (two) times daily as needed for mild constipation. 08/18/22  Yes Kathlen Mody, MD  rosuvastatin (CRESTOR) 20 MG tablet Take 1 tablet (20 mg total) by mouth daily. 10/13/22  Yes Vonna Drafts, MD  sennosides (SENOKOT) 8.8 MG/5ML syrup Place 5 mLs into feeding tube at bedtime as needed for mild constipation. 08/18/22  Yes Kathlen Mody, MD  Tetrahydrozoline HCl (VISINE OP) Place 1 drop into both eyes daily as needed (allergies).   Yes [provider]  Water For Irrigation, Sterile (FREE WATER) SOLN Place 200 mLs into feeding tube every 4 (four) hours. 08/14/22  Yes Kathlen Mody, MD  white petrolatum (VASELINE) OINT Apply 1 Application topically as needed for lip care. 08/14/22  Yes Kathlen Mody, MD    Family History Family History  Problem Relation Age of Onset   Diabetes Father    Kidney disease Father    Stomach cancer Maternal Grandmother 64   Stroke Maternal Grandmother    Colon cancer Other    Colon polyps Neg Hx    Esophageal cancer Neg Hx    Rectal cancer Neg Hx     Social History Social History   Tobacco Use   Smoking status: Never    Passive exposure: Never   Smokeless tobacco: Never  Vaping Use   Vaping status: Never Used  Substance Use Topics   Alcohol use: No    Alcohol/week: 0.0 standard drinks of alcohol   Drug use: No     Allergies   Acetaminophen, Morphine and codeine, Nsaids, and Other   Review of Systems Review of Systems  Constitutional:  Negative for chills and fever.  Gastrointestinal:  Positive for abdominal pain (lower abdomen). Negative for nausea and vomiting.  Genitourinary:  Positive for dysuria, frequency and vaginal pain. Negative for vaginal bleeding and vaginal discharge.  Musculoskeletal:  Positive for gait problem  (legs hurt).     Physical Exam Triage Vital Signs ED Triage Vitals  Encounter Vitals Group     BP 01/07/23 1414 129/83     Systolic BP Percentile --      Diastolic BP Percentile --      Pulse Rate 01/07/23 1414 71     Resp 01/07/23 1414 18     Temp 01/07/23 1414 98.1 F (36.7 C)     Temp Source 01/07/23 1414 Oral     SpO2 01/07/23 1414 95 %     Weight 01/07/23 1413 186 lb 1.1 oz (84.4 kg)     Height 01/07/23 1413 5\' 7"  (1.702 m)  Head Circumference --      Peak Flow --      Pain Score 01/07/23 1412 10     Pain Loc --      Pain Education --      Exclude from Growth Chart --    No data found.  Updated Vital Signs BP 129/83 (BP Location: Left Arm)   Pulse 71   Temp 98.1 F (36.7 C) (Oral)   Resp 18   Ht 5\' 7"  (1.702 m)   Wt 186 lb 1.1 oz (84.4 kg)   SpO2 95%   BMI 29.14 kg/m   Visual Acuity Right Eye Distance:   Left Eye Distance:   Bilateral Distance:    Right Eye Near:   Left Eye Near:    Bilateral Near:     Physical Exam Vitals and nursing note reviewed.  HENT:     Head: Normocephalic and atraumatic.     Mouth/Throat:     Mouth: Mucous membranes are moist.  Eyes:     Conjunctiva/sclera: Conjunctivae normal.  Cardiovascular:     Rate and Rhythm: Normal rate and regular rhythm.     Heart sounds: Normal heart sounds.  Pulmonary:     Effort: Pulmonary effort is normal.     Breath sounds: Normal breath sounds.  Abdominal:     Palpations: Abdomen is soft.     Tenderness: There is no abdominal tenderness. There is no right CVA tenderness or left CVA tenderness.  Musculoskeletal:     Cervical back: Neck supple.  Skin:    General: Skin is warm and dry.  Neurological:     Mental Status: She is alert.      UC Treatments / Results  Labs (all labs ordered are listed, but only abnormal results are displayed) Labs Reviewed  POCT URINALYSIS DIP (MANUAL ENTRY)    EKG   Radiology No results found.  Procedures Procedures (including critical  care time)  Medications Ordered in UC Medications - No data to display  Initial Impression / Assessment and Plan / UC Course  I have reviewed the triage vital signs and the nursing notes.  Pertinent labs & imaging results that were available during my care of the patient were reviewed by me and considered in my medical decision making (see chart for details).    62 year old female currently being treated for urinary tract infection with symptoms. Point-of-care urinalysis continues to be cloudy, with positive nitrates and large leuks.  Will send urine culture.  Recommend she continue current medications, crease fluid intake, follow-up with PCP Monday, ED for worsening symptoms or concerns Final Clinical Impressions(s) / UC Diagnoses   Final diagnoses:  None   Discharge Instructions   None    ED Prescriptions   None    PDMP not reviewed this encounter.   Meliton Rattan, [place] 01/07/23 1521

## 2023-01-08 LAB — URINE CULTURE: Culture: <10000 — AB

## 2023-01-14 DIAGNOSIS — Z0271 Encounter for disability determination: Secondary | ICD-10-CM

## 2023-04-28 ENCOUNTER — Telehealth: Payer: Self-pay

## 2023-04-28 NOTE — Telephone Encounter (Signed)
Patient's daughter calls nurse line regarding patient.   She reports that patient woke up today with headache, sinus pressure, body aches and cough.   She has also been experiencing dizziness with position changes.   Daughter was concerned due to patient having stroke in May 2024. Patient denies facial drooping, speech issues, arm weakness, or issues with gait.   Scheduled patient for tomorrow morning for further evaluation.   Strict ED precautions discussed.   Veronda Prude, RN

## 2023-04-29 ENCOUNTER — Encounter: Payer: Self-pay | Admitting: Student

## 2023-04-29 ENCOUNTER — Ambulatory Visit (INDEPENDENT_AMBULATORY_CARE_PROVIDER_SITE_OTHER): Payer: BLUE CROSS/BLUE SHIELD | Admitting: Student

## 2023-04-29 VITALS — BP 135/76 | HR 72 | Temp 98.1°F | Ht 66.0 in | Wt 185.4 lb

## 2023-04-29 DIAGNOSIS — R300 Dysuria: Secondary | ICD-10-CM

## 2023-04-29 DIAGNOSIS — R0981 Nasal congestion: Secondary | ICD-10-CM | POA: Diagnosis not present

## 2023-04-29 DIAGNOSIS — R1900 Intra-abdominal and pelvic swelling, mass and lump, unspecified site: Secondary | ICD-10-CM

## 2023-04-29 LAB — POC SOFIA 2 FLU + SARS ANTIGEN FIA
Influenza A, POC: NEGATIVE
Influenza B, POC: NEGATIVE
SARS Coronavirus 2 Ag: POSITIVE — AB

## 2023-04-29 LAB — POCT URINALYSIS DIP (MANUAL ENTRY)
Bilirubin, UA: NEGATIVE
Glucose, UA: NEGATIVE mg/dL
Ketones, POC UA: NEGATIVE mg/dL
Nitrite, UA: POSITIVE — AB
Protein Ur, POC: 30 mg/dL — AB
Spec Grav, UA: 1.015 (ref 1.010–1.025)
Urobilinogen, UA: 0.2 U/dL
pH, UA: 6 (ref 5.0–8.0)

## 2023-04-29 LAB — POCT UA - MICROSCOPIC ONLY
Epithelial cells, urine per micros: >20
RBC, Urine, Miroscopic: >20 (ref 0–2)
WBC, Ur, HPF, POC: >20 (ref 0–5)

## 2023-04-29 MED ORDER — BENZONATATE 100 MG PO CAPS: 100.0000 mg | ORAL_CAPSULE | Freq: Three times a day (TID) | ORAL | 0 refills | Status: DC | PRN

## 2023-04-29 NOTE — Addendum Note (Signed)
Addended by: Glendale Chard on: 04/29/2023 12:03 PM   Modules accepted: Orders

## 2023-04-29 NOTE — Patient Instructions (Signed)
It was great to see you today!   Keep hydrated. I have sent in a prescription for tessalon pearls which can help with cough.   I am testing your urine for infection. I will have the results back in a few days. If you develop fever, chills, or tenderness in your back please call to be seen.   Future Appointments  Date Time Provider Department Center  05/04/2023 11:00 AM Butch Penny, NP [facility] None  05/19/2023  9:30 AM Vonna Drafts, MD [facility]    Please arrive 15 minutes before your appointment to ensure smooth check in process.    Please call the clinic at [phone] if your symptoms worsen or you have any concerns.  Thank you for allowing me to participate in your care, Dr. Glendale Chard [facility]

## 2023-04-29 NOTE — Progress Notes (Addendum)
    SUBJECTIVE:   CHIEF COMPLAINT / HPI:   Anna Berry is a 63 y.o. female  presenting for URI.   URI:  Symptoms started Monday with cough and congestion.  Medications tried at home none.  Tmax: unknown  Sick contacts: husband Signs of respiratory distress: no Appetite: decreased  Hydration: normal   Possible UTI:  Patient reports for the last few days she has had her symptoms of a UTI. Normally she gets abdominal swelling when she has a UTI. Denies dysuria, increased frequency, flank pain, fevers. Repots she has had multiple UTIs and this is a chronic problem for her.    PERTINENT  PMH / PSH: Reviewed and updated   OBJECTIVE:   BP 135/76   Pulse 72   Temp 98.1 F (36.7 C) (Oral)   Ht 5\' 6"  (1.676 m)   Wt 185 lb 6.4 oz (84.1 kg)   SpO2 100%   BMI 29.92 kg/m   Well-appearing, no acute distress HEENT: erythematous nasal turbinates, clear TMs bilaterally, mild cervical lymphadenopathy bilaterally. Mild maxillary sinus tenderness Cardio: Regular rate, regular rhythm, no murmurs on exam. <2 sec capillary refill  Pulm: Clear, no wheezing, no crackles. No increased work of breathing Abdominal: bowel sounds present, soft, non-tender, non-distended  ASSESSMENT/PLAN:   No problem-specific Assessment & Plan notes found for this encounter.   URI:  No signs of respiratory distress on exam. Patient appears well hydrated.  Most likely viral mediated, supportive therapy indicated with return precautions for trouble breathing or persistent fever.  POC tested preformed positive for COVID.  Tessalon Perles prescribed at patient request. Declines Paxlovid.   Dysuria:  UA and micro collected today. Her symptoms are not classical for UTI. Per chart review does not appear to have chronic UTI as last treated in 10/2022 with Keflex. Will await urine culture results prior to initiating antibiotics.    Abdominal swelling: With patient having the vague symptom of abdominal swelling  will broaden the differential to pathology outside of the renal system.  Will order an ultrasound abdominal complete to check for ascites and to evaluate kidneys.  Patient will need follow-up with her PCP for these results and to continue further workup. Called patient to discuss results.   Appointment scheduled with PCP to review mental difficulties and imaging.   Glendale Chard, DO [facility]

## 2023-05-03 ENCOUNTER — Telehealth: Payer: Self-pay | Admitting: Student

## 2023-05-03 DIAGNOSIS — N3001 Acute cystitis with hematuria: Secondary | ICD-10-CM

## 2023-05-03 LAB — URINE CULTURE

## 2023-05-03 MED ORDER — CEPHALEXIN 500 MG PO CAPS: 500.0000 mg | ORAL_CAPSULE | Freq: Two times a day (BID) | ORAL | 0 refills | Status: AC

## 2023-05-03 NOTE — Telephone Encounter (Signed)
Called patient due to urine culture results showing positive UTI. Spoke with patient's husband about results. He confirmed pharmacy and prescription for Keflex was sent.   Glendale Chard, DO [facility], PGY-2 05/03/23 2:12 PM

## 2023-05-04 ENCOUNTER — Ambulatory Visit: Payer: Medicaid Other | Admitting: Adult Health

## 2023-05-04 ENCOUNTER — Encounter: Payer: Self-pay | Admitting: Adult Health

## 2023-05-05 ENCOUNTER — Ambulatory Visit (HOSPITAL_COMMUNITY): Payer: BLUE CROSS/BLUE SHIELD

## 2023-05-11 ENCOUNTER — Ambulatory Visit (HOSPITAL_COMMUNITY): Admission: RE | Admit: 2023-05-11 | Payer: Medicaid Other | Source: Ambulatory Visit

## 2023-05-19 ENCOUNTER — Encounter: Payer: Self-pay | Admitting: Family Medicine

## 2023-05-19 ENCOUNTER — Ambulatory Visit (INDEPENDENT_AMBULATORY_CARE_PROVIDER_SITE_OTHER): Payer: Self-pay | Admitting: Family Medicine

## 2023-05-19 VITALS — BP 135/71 | HR 72 | Ht 66.0 in | Wt 183.2 lb

## 2023-05-19 DIAGNOSIS — F329 Major depressive disorder, single episode, unspecified: Secondary | ICD-10-CM | POA: Diagnosis not present

## 2023-05-19 DIAGNOSIS — I611 Nontraumatic intracerebral hemorrhage in hemisphere, cortical: Secondary | ICD-10-CM | POA: Diagnosis not present

## 2023-05-19 NOTE — Assessment & Plan Note (Signed)
 Advised f/u with neurology and touching base with their office to schedule MRI which was ordered in July.

## 2023-05-19 NOTE — Patient Instructions (Addendum)
 Please visit [email or web address] for therapy resources Please follow-up within the next couple of months to revisit this topic.  Please reach out sooner if needed or if worsening  Please schedule follow-up with your neurologist and ask them about scheduling your MRI   Therapy and Counseling Resources Most providers on this list will take Medicaid. Patients with commercial insurance or Medicare should contact their insurance company to get a list of in network providers.  [facility] (takes children) Location 1: [address] [place], [place] [address] Location 2: [address] [place], [place] [address] [phone]   [facility] (spanish speaking therapist available)(habla espanol)(take medicare and medicaid)  [address], [place], [place] [address], [email or web address] [phone]  [facility] [address] [place], [place] [address] [phone]  [facility]   [address], [address] [place], [place] [address]      [phone]  [facility] (spanish available) [address]  [place], [place] [address] [phone]  [facility] (take [facility] and medicare) [address], [address]  [place], [place] [address]       [phone]     [facility] (virtual only) [phone]  Jovita Kussmaul [facility] [address] [place], [place] [phone]  [facility]:  [address] [place] [phone]  [facility]:  [address] [place] [place] [phone]  [facility] (under & uninsured) [address]   [place] [phone]    [email or web address]    [facility] [address]  [place]    [phone]  [facility] -[address]     Phone:  [phone]     [facility]-  [address]  [phone]    [facility] [address]      [place], [place] [phone]  [facility]: [address] [place], [place], [place]  [phone]   [facility] (Spanish therapist) [email or web address]  [address] [place]  [address]   [place] [address]    [phone]    [facility]   [address],  [place], [place]  [phone]   [facility]  [address] [place]  [phone]  [facility]  [address] [place], [place]        [phone]  Open Access/Walk In Clinic under & uninsured  [facility]  [address] [place], [place] [phone] Crisis [phone]  [facility] [place],  (Spanish)   [address], [place]: [phone]) 8:30 - 12; 1 - 2:30  [facility] [employer],  [address] [place], [place]    [phone]):8:30 - 12; 2 - 3PM  [facility] [employer],  [address]  [place]; [phone]):   Mon - Fri 8 AM - 5 PM  [facility] [address] [place]  MWF 12:30 to 3:00 or call to schedule an appointment  [phone]  Specific Provider options Psychology Today  [email or web address]/us click on find a therapist  enter your zip code left side and select or tailor a therapist for your specific need.   [facility] Provider Directory [email or web address]  (Medicaid)   Follow all drop down to find a provider  Social Support program [facility] [place] [phone] or www.[email or web address]  [address], [place], [place] [facility] support and educational   24- Hour Availability:   [facility]  [address] [place], [place] [phone] Crisis [phone]  [facility] [phone]  [facility]  [phone]   [facility]  [phone] (after hours)  Therapeutic Alternative/Mobile Crisis   [phone]  [place] National Suicide Hotline  [phone] Len Childs)  Call 911 or go to emergency room  [facility]  [phone]);  [place] and [employer]  [phone]); [place], [place], [place], [place], [place], [place], [place]

## 2023-05-19 NOTE — Progress Notes (Signed)
    SUBJECTIVE:   CHIEF COMPLAINT / HPI:   Here with daughter for f/u of memory issues Follows w/ neurology for hx of ICH Due for appointment They ordered an MRI at last visit which has not been done  Notably patient has a PHQ-9 of 18 today.  She answered 2 on question #9. Patient and daughter agree that after the patient's stroke she has suffered from a loss of independence and general sense of hopelessness towards her life.  She often feels like she would be better off dead.  She has not formulated a plan to harm herself or others.  She does not think that she would actually act on this feeling.  She feels that her grandchildren keep her motivated to stay alive.  In the past she has not explored therapy as much but after discussing today she is willing to give this a try.  She prefers to try this before trying any medication.  Understands that in the future she may need medication  PERTINENT  PMH / PSH: History of left frontal intracranial hemorrhage  OBJECTIVE:   BP 135/71   Pulse 72   Ht 5\' 6"  (1.676 m)   Wt 183 lb 3.2 oz (83.1 kg)   SpO2 99%   BMI 29.57 kg/m   General: NAD, pleasant, able to participate in exam Respiratory: No respiratory distress Skin: warm and dry, no rashes noted Psych: Depressed mood and affect     05/19/2023    9:18 AM 04/29/2023    9:17 AM 11/11/2022    8:58 AM  Depression screen PHQ 2/9  Decreased Interest 2 2 0  Down, Depressed, Hopeless 2 2 0  PHQ - 2 Score 4 4 0  Altered sleeping 2 2 0  Tired, decreased energy 2 2 1   Change in appetite 2 2 0  Feeling bad or failure about yourself  2 2 0  Trouble concentrating 2 2 1   Moving slowly or fidgety/restless 2 2 0  Suicidal thoughts 2 0 0  PHQ-9 Score 18 16 2   Difficult doing work/chores Somewhat difficult       ASSESSMENT/PLAN:   Assessment & Plan Major depressive disorder, remission status unspecified, unspecified whether recurrent Score of 18 on PHQ9. Provided therapy resources.  After  long discussion with patient and daughter, do not feel that patient is an active threat to herself or others.  They prefer to trial therapy before starting medication.  Seems like she is motivated to do this. Follow-up in about 6 weeks, at that time ideally she will have been able to see a therapist.  Revisit discussion regarding medication at that time. Nontraumatic cortical hemorrhage of left cerebral hemisphere [facility]) Advised f/u with neurology and touching base with their office to schedule MRI which was ordered in July.   Vonna Drafts, MD [facility]

## 2023-09-10 ENCOUNTER — Encounter (HOSPITAL_COMMUNITY): Payer: Self-pay | Admitting: Interventional Radiology

## 2023-09-29 ENCOUNTER — Emergency Department (HOSPITAL_COMMUNITY)
Admission: EM | Admit: 2023-09-29 | Discharge: 2023-09-29 | Disposition: A | Discharge status: Home / Self Care | Attending: Emergency Medicine | Admitting: Emergency Medicine

## 2023-09-29 ENCOUNTER — Other Ambulatory Visit: Payer: Self-pay

## 2023-09-29 DIAGNOSIS — T24201A Burn of second degree of unspecified site of right lower limb, except ankle and foot, initial encounter: Secondary | ICD-10-CM

## 2023-09-29 DIAGNOSIS — Z23 Encounter for immunization: Secondary | ICD-10-CM | POA: Insufficient documentation

## 2023-09-29 DIAGNOSIS — T24211A Burn of second degree of right thigh, initial encounter: Secondary | ICD-10-CM | POA: Diagnosis not present

## 2023-09-29 DIAGNOSIS — J45909 Unspecified asthma, uncomplicated: Secondary | ICD-10-CM | POA: Diagnosis not present

## 2023-09-29 DIAGNOSIS — X118XXA Contact with other hot tap-water, initial encounter: Secondary | ICD-10-CM | POA: Diagnosis not present

## 2023-09-29 DIAGNOSIS — T24011A Burn of unspecified degree of right thigh, initial encounter: Secondary | ICD-10-CM | POA: Diagnosis present

## 2023-09-29 DIAGNOSIS — T24231A Burn of second degree of right lower leg, initial encounter: Secondary | ICD-10-CM | POA: Diagnosis not present

## 2023-09-29 DIAGNOSIS — T300 Burn of unspecified body region, unspecified degree: Secondary | ICD-10-CM

## 2023-09-29 DIAGNOSIS — T310 Burns involving less than 10% of body surface: Secondary | ICD-10-CM | POA: Diagnosis not present

## 2023-09-29 MED ORDER — OXYCODONE-ACETAMINOPHEN 5-325 MG PO TABS: 1.0000 | ORAL_TABLET | Freq: Four times a day (QID) | ORAL | 0 refills | Status: DC | PRN

## 2023-09-29 MED ORDER — TETANUS-DIPHTH-ACELL PERTUSSIS 5-2.5-18.5 LF-MCG/0.5 IM SUSY
0.5000 mL | PREFILLED_SYRINGE | Freq: Once | INTRAMUSCULAR | Status: AC
  Administered 2023-09-29: 0.5 mL via INTRAMUSCULAR
  Filled 2023-09-29: qty 0.5

## 2023-09-29 MED ORDER — OXYCODONE HCL 5 MG PO TABS
5.0000 mg | ORAL_TABLET | Freq: Once | ORAL | Status: AC
  Administered 2023-09-29: 5 mg via ORAL
  Filled 2023-09-29: qty 1
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
## 2023-09-29 NOTE — ED Notes (Signed)
 Burn dressed with xeroform dressing and abd pads.  Patient tolerated well.
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
## 2023-10-04 DIAGNOSIS — T310 Burns involving less than 10% of body surface: Secondary | ICD-10-CM | POA: Diagnosis not present

## 2023-10-05 ENCOUNTER — Telehealth: Payer: Self-pay

## 2023-10-05 NOTE — Telephone Encounter (Signed)
 Anna Berry patients spouse LVM on nurse line requesting a call back.   I called Anna Berry, however no answer. VM left to return my call.

## 2023-10-05 NOTE — Telephone Encounter (Signed)
 Patient's husband returns call to nurse line. He states that they are needing documentation stating that patient is unable to work based off of her medical conditions.   Advised that patient has not been seen since March and would recommend that they schedule appointment with PCP to further discuss this.   Scheduled for tomorrow AM with PCP.   Chiquita JAYSON English, RN

## 2023-10-06 ENCOUNTER — Ambulatory Visit: Admitting: Family Medicine

## 2023-10-06 ENCOUNTER — Encounter: Payer: Self-pay | Admitting: Family Medicine

## 2023-10-06 VITALS — BP 135/73 | HR 78 | Ht 66.0 in | Wt 178.1 lb

## 2023-10-06 DIAGNOSIS — I611 Nontraumatic intracerebral hemorrhage in hemisphere, cortical: Secondary | ICD-10-CM | POA: Diagnosis not present

## 2023-10-06 DIAGNOSIS — Z1231 Encounter for screening mammogram for malignant neoplasm of breast: Secondary | ICD-10-CM | POA: Diagnosis not present

## 2023-10-06 DIAGNOSIS — Z1211 Encounter for screening for malignant neoplasm of colon: Secondary | ICD-10-CM | POA: Diagnosis not present

## 2023-10-06 NOTE — Progress Notes (Signed)
    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 Had stroke on 07/24/2022 afterwards did not work Had residual right-sided weakness and was unable to drive Also had mood changes and memory deficits  Saw psychiatry within the last month, planning to follow-up  No longer able to drive after stroke  Hoping to follow up with neurology soon  Had some insurance issues so had interruptions in PT and neurology care, requesting new referrals now that they have coverage   PERTINENT  PMH / PSH: History of left frontal intracranial hemorrhage in May 2024  OBJECTIVE:   BP 135/73   Pulse 78   Ht 5' 6 (1.676 m)   Wt 178 lb 2 oz (80.8 kg)   SpO2 99%   BMI 28.75 kg/m    General: NAD, pleasant, able to participate in exam Respiratory: No respiratory distress Skin: warm and dry, no rashes noted Neuro: CN II-XII intact without focal deficits, strength 4.5/5 in RUE and RLE, 5/5 in LUE/LLE, sensation intact bilaterally, alert, no obvious focal deficits Psych: Normal affect and mood  ASSESSMENT/PLAN:   Assessment & Plan Nontraumatic cortical hemorrhage of left cerebral hemisphere [facility]) Provided letter stating that patient was unable to work after her stroke in May 2024 Discussed process to complete disability paperwork Curious if this was sent to neurology office Placed new referrals for physical therapy and neurology given brief interruption in care due to insurance issues.  Patient saw [facility] neurology Associates Follow-up with psychiatry as well for mood Screen for colon cancer GI referral Encounter for screening mammogram for malignant neoplasm of breast Ordered mammogram   Payton Coward, MD [facility]

## 2023-10-06 NOTE — Patient Instructions (Addendum)
 Please follow up with neurology and PT - you should get calls to schedule these appointments soon  Please schedule your mammogram   You should get a call about the colonoscopy soon

## 2023-10-06 NOTE — Assessment & Plan Note (Signed)
 Provided letter stating that patient was unable to work after her stroke in May 2024 Discussed process to complete disability paperwork Curious if this was sent to neurology office Placed new referrals for physical therapy and neurology given brief interruption in care due to insurance issues.  Patient saw [facility] neurology Associates Follow-up with psychiatry as well for mood

## 2023-10-11 ENCOUNTER — Telehealth: Payer: Self-pay | Admitting: Family Medicine

## 2023-10-11 NOTE — Telephone Encounter (Signed)
 Form placed up front for pick up.  Copy made for batch scanning.   VM left informing patients husband.

## 2023-10-11 NOTE — Telephone Encounter (Signed)
 Placed a form in your box that the patient needs completed.

## 2023-10-17 ENCOUNTER — Other Ambulatory Visit: Payer: Self-pay

## 2023-10-17 ENCOUNTER — Encounter (HOSPITAL_COMMUNITY): Payer: Self-pay | Admitting: Pharmacy Technician

## 2023-10-17 ENCOUNTER — Emergency Department (HOSPITAL_COMMUNITY)
Admission: EM | Admit: 2023-10-17 | Discharge: 2023-10-17 | Disposition: A | Discharge status: Home / Self Care | Source: Ambulatory Visit | Attending: Emergency Medicine | Admitting: Emergency Medicine

## 2023-10-17 DIAGNOSIS — T24231A Burn of second degree of right lower leg, initial encounter: Secondary | ICD-10-CM | POA: Diagnosis not present

## 2023-10-17 DIAGNOSIS — T300 Burn of unspecified body region, unspecified degree: Secondary | ICD-10-CM

## 2023-10-17 DIAGNOSIS — Z96651 Presence of right artificial knee joint: Secondary | ICD-10-CM | POA: Diagnosis not present

## 2023-10-17 DIAGNOSIS — Z8673 Personal history of transient ischemic attack (TIA), and cerebral infarction without residual deficits: Secondary | ICD-10-CM | POA: Insufficient documentation

## 2023-10-17 DIAGNOSIS — J45909 Unspecified asthma, uncomplicated: Secondary | ICD-10-CM | POA: Insufficient documentation

## 2023-10-17 DIAGNOSIS — T310 Burns involving less than 10% of body surface: Secondary | ICD-10-CM | POA: Diagnosis not present

## 2023-10-17 DIAGNOSIS — T24031A Burn of unspecified degree of right lower leg, initial encounter: Secondary | ICD-10-CM | POA: Insufficient documentation

## 2023-10-17 LAB — CBC WITH DIFFERENTIAL/PLATELET
Abs Immature Granulocytes: 0.01 K/uL (ref 0.00–0.07)
Basophils Absolute: 0 K/uL (ref 0.0–0.1)
Basophils Relative: 1 %
Eosinophils Absolute: 0.2 K/uL (ref 0.0–0.5)
Eosinophils Relative: 2 %
HCT: 40 % (ref 36.0–46.0)
Hemoglobin: 12.9 g/dL (ref 12.0–15.0)
Immature Granulocytes: 0 %
Lymphocytes Relative: 28 %
Lymphs Abs: 2.1 K/uL (ref 0.7–4.0)
MCH: 28.5 pg (ref 26.0–34.0)
MCHC: 32.3 g/dL (ref 30.0–36.0)
MCV: 88.3 fL (ref 80.0–100.0)
Monocytes Absolute: 0.4 K/uL (ref 0.1–1.0)
Monocytes Relative: 5 %
Neutro Abs: 4.8 K/uL (ref 1.7–7.7)
Neutrophils Relative %: 64 %
Platelets: 303 K/uL (ref 150–400)
RBC: 4.53 MIL/uL (ref 3.87–5.11)
RDW: 14.6 % (ref 11.5–15.5)
WBC: 7.4 K/uL (ref 4.0–10.5)
nRBC: 0 % (ref 0.0–0.2)

## 2023-10-17 LAB — COMPREHENSIVE METABOLIC PANEL WITH GFR
ALT: 15 U/L (ref 0–44)
AST: 14 U/L — ABNORMAL LOW (ref 15–41)
Albumin: 3.6 g/dL (ref 3.5–5.0)
Alkaline Phosphatase: 84 U/L (ref 38–126)
Anion gap: 8 (ref 5–15)
BUN: 5 mg/dL — ABNORMAL LOW (ref 8–23)
CO2: 28 mmol/L (ref 22–32)
Calcium: 9.1 mg/dL (ref 8.9–10.3)
Chloride: 109 mmol/L (ref 98–111)
Creatinine, Ser: 1 mg/dL (ref 0.44–1.00)
GFR, Estimated: >60 mL/min (ref >60)
Glucose, Bld: 104 mg/dL — ABNORMAL HIGH (ref 70–99)
Potassium: 3.5 mmol/L (ref 3.5–5.1)
Sodium: 145 mmol/L (ref 135–145)
Total Bilirubin: 0.5 mg/dL (ref 0.0–1.2)
Total Protein: 7.1 g/dL (ref 6.5–8.1)

## 2023-10-17 NOTE — ED Notes (Signed)
 Patient discharged by RN. Patient verbalizes understanding of instructions. In wheelchair to lobby
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
## 2023-10-17 NOTE — Discharge Instructions (Signed)
 You were seen today for the wound on your right hip. While you were here we monitored your vitals, preformed a physical exam, and labs. These were all reassuring and there is no indication for any further testing or intervention in the emergency department at this time.   Things to do:  - Follow up with your primary care provider within the next 1-2 weeks - Please see the burn care team tomorrow at your scheduled appointment.  Your wound does not look infected at this time  Return to the emergency department if you have any new or worsening symptoms including worsening of pain, pus or other discharge.  Significant redness, swelling or warmth to the wound, or if you have any other concerns.
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
## 2023-10-18 DIAGNOSIS — T310 Burns involving less than 10% of body surface: Secondary | ICD-10-CM | POA: Diagnosis not present

## 2023-11-26 ENCOUNTER — Encounter: Payer: Self-pay | Admitting: Gastroenterology

## 2023-12-03 ENCOUNTER — Ambulatory Visit
Admission: RE | Admit: 2023-12-03 | Discharge: 2023-12-03 | Disposition: A | Discharge status: Home / Self Care | Source: Ambulatory Visit | Attending: Family Medicine | Admitting: Family Medicine

## 2023-12-03 DIAGNOSIS — Z1231 Encounter for screening mammogram for malignant neoplasm of breast: Secondary | ICD-10-CM

## 2023-12-06 ENCOUNTER — Ambulatory Visit: Attending: Family Medicine

## 2023-12-06 VITALS — BP 151/94 | HR 62

## 2023-12-06 DIAGNOSIS — I611 Nontraumatic intracerebral hemorrhage in hemisphere, cortical: Secondary | ICD-10-CM | POA: Insufficient documentation

## 2023-12-06 DIAGNOSIS — M6281 Muscle weakness (generalized): Secondary | ICD-10-CM | POA: Insufficient documentation

## 2023-12-06 DIAGNOSIS — R293 Abnormal posture: Secondary | ICD-10-CM | POA: Diagnosis present

## 2023-12-06 DIAGNOSIS — R2689 Other abnormalities of gait and mobility: Secondary | ICD-10-CM | POA: Insufficient documentation

## 2023-12-06 NOTE — Therapy (Signed)
 OUTPATIENT PHYSICAL THERAPY NEURO EVALUATION   Patient Name: Anna Berry MRN: [id number] DOB:1960/05/12, 63 y.o., female Today's Date: 12/06/2023   PCP: Payton Coward, MD REFERRING PROVIDER: Krystal Bale, MD  END OF SESSION:  PT End of Session - 12/06/23 0931     Visit Number 1    Number of Visits 5    Date for Recertification  01/07/24    Authorization Type [place] medicaid- [place]    PT Start Time [phone]    PT Stop Time 1006    PT Time Calculation (min) 33 min    Equipment Utilized During Treatment Gait belt    Activity Tolerance Patient tolerated treatment well    Behavior During Therapy Flat affect          Past Medical History:  Diagnosis Date   Allergy    Anemia    Arthritis    Asthma    allergies induced   Bilateral lower extremity edema    Depression    Fibroids 08/2002   Gross hematuria 05/2010   History of appendicitis    History of colon polyps 2017   Multiple renal cysts 05/24/2008   Two left renal parapelvic cysts.   OA (osteoarthritis) of knee    Right   Obesity    Plantar fasciitis    Postmenopausal    Pre-diabetes    Renal papillary necrosis (HCC)    Seasonal allergies    Sickle cell anemia (HCC)    sickle cell trait   Sickle cell trait (HCC)    Sigmoid diverticulosis 09/26/2015   Noted on colonoscopy   Stroke [facility])    Past Surgical History:  Procedure Laterality Date   ABDOMINAL HYSTERECTOMY  2004   APPENDECTOMY  2006   CESAREAN SECTION  1981, 1987   x2   COLONOSCOPY     COLONOSCOPY W/ POLYPECTOMY  09/26/2015   IR GASTROSTOMY TUBE MOD SED  08/12/2022   IR GASTROSTOMY TUBE REMOVAL  11/19/2022   POLYPECTOMY     TOTAL KNEE ARTHROPLASTY Right 07/29/2018   Procedure: RIGHT TOTAL KNEE ARTHROPLASTY;  Surgeon: Jerri Kay HERO, MD;  Location: [facility];  Service: Orthopedics;  Laterality: Right;   Patient Active Problem List   Diagnosis Date Noted   Adjustment disorder with depressed mood 01/05/2023   Ankle swelling 11/06/2022   ICH  (intracerebral hemorrhage) (HCC) 07/25/2022   H/O: hysterectomy 09/12/2021   Acute cystitis with hematuria 06/30/2019   History of total knee replacement, 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   Obesity (BMI 30-39.9) 05/11/2012   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   Asthma 12/29/2009   Hematuria 05/21/2008   ALLERGIC RHINITIS 05/29/2007   SICKLE CELL TRAIT 05/13/2006    ONSET DATE: 10/06/23 referral   REFERRING DIAG: I61.1 (ICD-10-CM) - 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 Reports having a CVA ~ 1 year ago. She endorses residual R hemi weakness and decreased memory. Has not truly had PT since her stroke. Patient reports feeling very sad since her CVA and even a little before- requesting follow up services.  Pt accompanied by: self  PERTINENT HISTORY: anemia, depression, R TKA 2019, SSA, plantar fasciitis, CVA (2024)  PAIN:  Are you having pain? No  Vitals:   12/06/23 0955 12/06/23 0957  BP: (!) 153/100 (!) 151/94  Pulse: 68 62     PRECAUTIONS: Fall   WEIGHT BEARING RESTRICTIONS: No  FALLS: Has patient fallen in last 6 months? No  LIVING ENVIRONMENT: Lives with: lives with their family Lives in: House/apartment Stairs: Yes: External: 2 steps; none Has following equipment at home: Crutches, shower chair, and Grab bars  PLOF: Independent, not driving  PATIENT GOALS: to be to  drive  OBJECTIVE:  Note: Objective measures were completed at Evaluation unless otherwise noted.  DIAGNOSTIC FINDINGS: 07/25/22 brain CT IMPRESSION: Unchanged large mixed intra- and extra-axial hematoma at the left frontal lobe with persistent rightward midline shift that measures 5 mm.  COGNITION: Overall cognitive status: No family/caregiver present to determine baseline cognitive functioning   SENSATION: WFL  COORDINATION: WFL B LE heel shin and figure 8   POSTURE: rounded shoulders and forward head  LOWER EXTREMITY MMT:    MMT Right Eval Left Eval  Hip flexion 5 5  Hip extension    Hip abduction 5 5  Hip adduction 5 5  Hip internal rotation    Hip external rotation    Knee flexion 5 5  Knee extension 5 5  Ankle dorsiflexion 5 5  Ankle plantarflexion    Ankle inversion    Ankle eversion    (Blank rows = not tested)  GAIT: Findings: Gait Characteristics: decreased arm swing- Right and decreased ankle dorsiflexion- Right, Distance walked: clinic, and Assistive device utilized:None  FUNCTIONAL TESTS:   [facility] PT Assessment - 12/06/23 0001       Standardized Balance Assessment   Standardized Balance Assessment 10 meter walk test    Five times sit to stand comments  17.9s B UE    10 Meter Walk .1m/s                                                                                                                                      TREATMENT Self care/home management: -address mental health concerns in terms of non-pharmacological measures to help improve   PATIENT EDUCATION: Education details: PT POC, exam findings, defer to MD for return to driving, see above [place] educated: Patient Education method: Medical illustrator Education comprehension: verbalized understanding and needs further education  HOME EXERCISE PROGRAM: -get outside 1x/ day  -attend 1 line dancing class  GOALS: Goals reviewed with patient? Yes  SHORT TERM GOALS: =  LTG based on PT  POC length   LONG TERM GOALS: Target date: 01/07/24  Pt will be independent with final HEP for improved functional strength and activity tolerance  Baseline: to be provided Goal status: INITIAL  2.  Pt will improve 5x STS to </= 13 sec to demo improved functional LE strength and balance   Baseline: 17s B UE Goal status: INITIAL  3.  Patient will attend at least 1 line dancing class during POC to demonstrate progress toward return to PLOF Baseline: not attending Goal status: INITIAL   ASSESSMENT:  CLINICAL IMPRESSION: Patient is a 63 y.o. female who was seen today for physical therapy evaluation and treatment for impaired mobility s/p CVA. Her stroke was ~16 months ago and she denies therapy services since her CVA. On MMT assessment, she has no R LE hemiparesis, but functionally, she demonstrates slightly decreased R hip flexion through swing phase of gait. She was previously participating in higher level activities, such as line dancing and would prefer to return to those activities. Five times Sit to Stand Test (FTSS) Method: Use a straight back chair with a solid seat that is 17-18" high. Ask participant to sit on the chair with arms folded across their chest.   Instructions: "Stand up and sit down as quickly as possible 5 times, keeping your arms folded across your chest."   Measurement: Stop timing when the participant touches the chair in sitting the 5th time.  TIME: 17 sec  Cut off scores indicative of increased fall risk: >12 sec CVA, >16 sec PD, >13 sec vestibular (ANPTA Core Set of Outcome Measures for Adults with Neurologic Conditions, 2018). 10 Meter Walk Test: Patient instructed to walk 10 meters (32.8 ft) as quickly and as safely as possible at their normal speed x2 and at a fast speed x2. Time measured from 2 meter mark to 8 meter mark to accommodate ramp-up and ramp-down.  Normal speed: .57m/s Cut off scores: <0.4 m/s = household Ambulator, 0.4-0.8  m/s = limited community Ambulator, >0.8 m/s = community Ambulator, >1.2 m/s = crossing a street, <1.0 = increased fall risk MCID 0.05 m/s (small), 0.13 m/s (moderate), 0.06 m/s (significant)  (ANPTA Core Set of Outcome Measures for Adults with Neurologic Conditions, 2018) She would benefit from skilled PT services to address the above mentioned deficits.    OBJECTIVE IMPAIRMENTS: Abnormal gait, cardiopulmonary status limiting activity, decreased activity tolerance, and decreased strength.   ACTIVITY LIMITATIONS: squatting, stairs, locomotion level, and caring for others  PARTICIPATION LIMITATIONS: meal prep, cleaning, driving, shopping, community activity, and occupation  PERSONAL FACTORS: Age, Behavior pattern, Fitness, Past/current experiences, Time since onset of injury/illness/exacerbation, Transportation, and 3+ comorbidities: see above are also affecting patient's functional outcome.   REHAB POTENTIAL: Fair time since onset  CLINICAL DECISION MAKING: Stable/uncomplicated  EVALUATION COMPLEXITY: Low  PLAN:  PT FREQUENCY: 1x/week  PT DURATION: 4 weeks  PLANNED INTERVENTIONS: 97164- PT Re-evaluation, 97750- Physical Performance Testing, 97110-Therapeutic exercises, 97530- Therapeutic activity, [id number]- Neuromuscular re-education, 97535- Self Care, 02859- Manual therapy, [id number]- Gait training, [phone]- Orthotic Initial, [id number]- Orthotic/Prosthetic subsequent, [phone]- Canalith repositioning, [id number]- Aquatic Therapy, [phone]- Electrical stimulation (manual), [phone] (1-2 muscles), [phone] (3+ muscles)- Dry Needling, Patient/Family education, Balance training, Stair training, Vestibular training, Visual/preceptual remediation/compensation, and DME instructions  PLAN FOR NEXT SESSION: higher level balance, R hip flexor strengthening, enjoys line dancing!   Delon DELENA Pop, PT Delon DELENA Pop, PT, DPT, CBIS  12/06/2023, 10:13 AM

## 2023-12-09 ENCOUNTER — Telehealth: Payer: Self-pay | Admitting: Licensed Clinical Social Worker

## 2023-12-09 NOTE — Patient Outreach (Signed)
 LCSW introduced self and explained role in Complex Care Management. Caregiver agreed to schedule initial for 10/17 at 12:30 PM

## 2023-12-14 ENCOUNTER — Telehealth: Payer: Self-pay | Admitting: Family Medicine

## 2023-12-14 ENCOUNTER — Telehealth: Payer: Self-pay

## 2023-12-14 ENCOUNTER — Ambulatory Visit
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
## 2023-12-14 NOTE — Telephone Encounter (Signed)
 Patient's husband calls nurse line requesting returned call from Jasmine.   Requesting returned call at [phone].  Chiquita JAYSON English, RN
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
## 2023-12-16 ENCOUNTER — Encounter: Payer: Self-pay | Admitting: Family Medicine

## 2023-12-16 ENCOUNTER — Ambulatory Visit: Attending: Family Medicine

## 2023-12-16 ENCOUNTER — Ambulatory Visit: Payer: Self-pay | Admitting: Family Medicine

## 2023-12-16 VITALS — BP 128/79 | HR 64

## 2023-12-16 VITALS — BP 126/72 | HR 74 | Ht 66.0 in | Wt 177.6 lb

## 2023-12-16 DIAGNOSIS — R2689 Other abnormalities of gait and mobility: Secondary | ICD-10-CM | POA: Insufficient documentation

## 2023-12-16 DIAGNOSIS — R2681 Unsteadiness on feet: Secondary | ICD-10-CM | POA: Diagnosis present

## 2023-12-16 DIAGNOSIS — M6281 Muscle weakness (generalized): Secondary | ICD-10-CM | POA: Insufficient documentation

## 2023-12-16 DIAGNOSIS — R293 Abnormal posture: Secondary | ICD-10-CM | POA: Diagnosis present

## 2023-12-16 DIAGNOSIS — T300 Burn of unspecified body region, unspecified degree: Secondary | ICD-10-CM | POA: Diagnosis present

## 2023-12-16 DIAGNOSIS — F331 Major depressive disorder, recurrent, moderate: Secondary | ICD-10-CM | POA: Diagnosis not present

## 2023-12-16 NOTE — Therapy (Signed)
 OUTPATIENT PHYSICAL THERAPY NEURO TREATMENT   Patient Name: Anna Berry MRN: [id number] DOB:1960-08-11, 63 y.o., female Today's Date: 12/16/2023   PCP: Payton Coward, MD REFERRING PROVIDER: Krystal Bale, MD  END OF SESSION:  PT End of Session - 12/16/23 1158     Visit Number 2    Number of Visits 5    Date for Recertification  01/07/24    Authorization Type [place] medicaid- [place]    Authorization - Number of Visits 5    PT Start Time 1156   patient late   PT Stop Time 1228    PT Time Calculation (min) 32 min    Activity Tolerance Patient tolerated treatment well    Behavior During Therapy Flat affect          Past Medical History:  Diagnosis Date   Allergy    Anemia    Arthritis    Asthma    allergies induced   Bilateral lower extremity edema    Depression    Fibroids 08/2002   Gross hematuria 05/2010   History of appendicitis    History of colon polyps 2017   Multiple renal cysts 05/24/2008   Two left renal parapelvic cysts.   OA (osteoarthritis) of knee    Right   Obesity    Plantar fasciitis    Postmenopausal    Pre-diabetes    Renal papillary necrosis    Seasonal allergies    Sickle cell anemia (HCC)    sickle cell trait   Sickle cell trait    Sigmoid diverticulosis 09/26/2015   Noted on colonoscopy   Stroke [facility])    Past Surgical History:  Procedure Laterality Date   ABDOMINAL HYSTERECTOMY  2004   APPENDECTOMY  2006   CESAREAN SECTION  1981, 1987   x2   COLONOSCOPY     COLONOSCOPY W/ POLYPECTOMY  09/26/2015   IR GASTROSTOMY TUBE MOD SED  08/12/2022   IR GASTROSTOMY TUBE REMOVAL  11/19/2022   POLYPECTOMY     TOTAL KNEE ARTHROPLASTY Right 07/29/2018   Procedure: RIGHT TOTAL KNEE ARTHROPLASTY;  Surgeon: Jerri Kay HERO, MD;  Location: [facility];  Service: Orthopedics;  Laterality: Right;   Patient Active Problem List   Diagnosis Date Noted   Adjustment disorder with depressed mood 01/05/2023   Ankle swelling 11/06/2022   ICH  (intracerebral hemorrhage) (HCC) 07/25/2022   H/O: hysterectomy 09/12/2021   Acute cystitis with hematuria 06/30/2019   History of total knee replacement, 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   Obesity (BMI 30-39.9) 05/11/2012   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 Denies falls. Was just at PCP and had difficulty relaying results of appt to PT.  Pt accompanied by: self  PERTINENT HISTORY: anemia, depression, R TKA 2019, SSA, plantar fasciitis, CVA (2024)  PAIN:  Are you having pain? No  Vitals:   12/16/23 1202  BP: 128/79  Pulse: 64    PRECAUTIONS: Fall  PATIENT GOALS: to be to drive  OBJECTIVE:  Note: Objective measures were completed at Evaluation unless otherwise noted.  DIAGNOSTIC FINDINGS: 07/25/22 brain CT IMPRESSION: Unchanged large mixed intra- and extra-axial hematoma at the left frontal lobe with persistent rightward midline shift that measures 5 mm.                                                                                                                             TREATMENT Therex: -scifit hills level 3 x10 mins B UE/LE for dynamic warm up  -initial HEP (see below)    PATIENT EDUCATION: Education details: initial HEP [place] educated: Patient Education method: Software engineer Education comprehension: verbalized understanding and needs further education  HOME EXERCISE PROGRAM: -get outside 1x/ day  -attend 1 line dancing class Access Code: [email or web address] URL: [email or web address] Date: 12/16/2023 Prepared by: Delon Pop  Exercises - Sit to Stand  - 1 x daily - 7 x weekly - 3 sets - 6-8 reps - Mini Squat with Counter Support  - 1 x daily - 7 x weekly - 3 sets - 10 reps - Standing Hip Abduction with Counter Support  - 1 x daily - 7 x weekly - 3 sets - 10 reps - Heel Raises with Counter Support  - 1 x daily - 7 x weekly - 3 sets - 10 reps - Standing March with Counter Support  - 1 x daily - 7 x weekly - 3 sets - 10 reps - Tandem Walking with Counter Support  - 1 x daily - 7 x weekly - 3 sets - 10 reps  GOALS: Goals reviewed with patient? Yes  SHORT TERM GOALS: = LTG based on PT POC length   LONG TERM GOALS: Target date: 01/07/24  Pt will be independent with final HEP for improved functional strength and activity tolerance  Baseline: to be provided Goal status: INITIAL  2.  Pt will improve 5x STS to </= 13 sec to demo improved functional LE strength and balance   Baseline: 17s B UE Goal status: INITIAL  3.  Patient will attend at least 1 line dancing class during POC to demonstrate progress toward return to PLOF Baseline: not attending Goal status: INITIAL   ASSESSMENT:  CLINICAL IMPRESSION: Patient seen for skilled PT session with emphasis on establishing initial HEP. Session largely limited by patient arriving late. She remains rather flat and withdrawn, notes that she is address this with her PCP. She required minimal cues for correct form for exercises. Continue POC.    OBJECTIVE IMPAIRMENTS: Abnormal gait, cardiopulmonary status  limiting activity, decreased activity tolerance, and decreased strength.   ACTIVITY LIMITATIONS: squatting, stairs, locomotion level, and caring for others  PARTICIPATION LIMITATIONS:  meal prep, cleaning, driving, shopping, community activity, and occupation  PERSONAL FACTORS: Age, Behavior pattern, Fitness, Past/current experiences, Time since onset of injury/illness/exacerbation, Transportation, and 3+ comorbidities: see above are also affecting patient's functional outcome.   REHAB POTENTIAL: Fair time since onset  CLINICAL DECISION MAKING: Stable/uncomplicated  EVALUATION COMPLEXITY: Low  PLAN:  PT FREQUENCY: 1x/week  PT DURATION: 4 weeks  PLANNED INTERVENTIONS: 97164- PT Re-evaluation, 97750- Physical Performance Testing, 97110-Therapeutic exercises, 97530- Therapeutic activity, [id number]- Neuromuscular re-education, 97535- Self Care, 02859- Manual therapy, [id number]- Gait training, [phone]- Orthotic Initial, [id number]- Orthotic/Prosthetic subsequent, [phone]- Canalith repositioning, [id number]- Aquatic Therapy, [phone]- Electrical stimulation (manual), [phone] (1-2 muscles), 20561 (3+ muscles)- Dry Needling, Patient/Family education, Balance training, Stair training, Vestibular training, Visual/preceptual remediation/compensation, and DME instructions  PLAN FOR NEXT SESSION: higher level balance, R hip flexor strengthening, enjoys line dancing! Blaze pods for reaction time   Delon DELENA Pop, PT Delon DELENA Pop, PT, DPT, CBIS  12/16/2023, 12:43 PM
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
## 2023-12-23 ENCOUNTER — Ambulatory Visit

## 2023-12-23 VITALS — BP 120/71 | HR 77

## 2023-12-23 DIAGNOSIS — R2689 Other abnormalities of gait and mobility: Secondary | ICD-10-CM | POA: Diagnosis not present

## 2023-12-23 DIAGNOSIS — M6281 Muscle weakness (generalized): Secondary | ICD-10-CM

## 2023-12-23 DIAGNOSIS — R293 Abnormal posture: Secondary | ICD-10-CM

## 2023-12-23 NOTE — Therapy (Signed)
 OUTPATIENT PHYSICAL THERAPY NEURO TREATMENT   Patient Name: Anna Berry MRN: [id number] DOB:07-Jul-1960, 63 y.o., female Today's Date: 12/23/2023   PCP: Payton Coward, MD REFERRING PROVIDER: Krystal Bale, MD  END OF SESSION:  PT End of Session - 12/23/23 1114     Visit Number 3    Number of Visits 5    Date for Recertification  01/07/24    Authorization Type [place] medicaid- [employer]    Authorization - Number of Visits 5    PT Start Time 1104    PT Stop Time 1144    PT Time Calculation (min) 40 min    Equipment Utilized During Treatment Gait belt    Activity Tolerance Patient tolerated treatment well;No increased pain    Behavior During Therapy Flat affect           Past Medical History:  Diagnosis Date   Allergy    Anemia    Arthritis    Asthma    allergies induced   Bilateral lower extremity edema    Depression    Fibroids 08/2002   Gross hematuria 05/2010   History of appendicitis    History of colon polyps 2017   Multiple renal cysts 05/24/2008   Two left renal parapelvic cysts.   OA (osteoarthritis) of knee    Right   Obesity    Plantar fasciitis    Postmenopausal    Pre-diabetes    Renal papillary necrosis    Seasonal allergies    Sickle cell anemia (HCC)    sickle cell trait   Sickle cell trait    Sigmoid diverticulosis 09/26/2015   Noted on colonoscopy   Stroke [facility])    Past Surgical History:  Procedure Laterality Date   ABDOMINAL HYSTERECTOMY  2004   APPENDECTOMY  2006   CESAREAN SECTION  1981, 1987   x2   COLONOSCOPY     COLONOSCOPY W/ POLYPECTOMY  09/26/2015   IR GASTROSTOMY TUBE MOD SED  08/12/2022   IR GASTROSTOMY TUBE REMOVAL  11/19/2022   POLYPECTOMY     TOTAL KNEE ARTHROPLASTY Right 07/29/2018   Procedure: RIGHT TOTAL KNEE ARTHROPLASTY;  Surgeon: Jerri Kay HERO, MD;  Location: [facility];  Service: Orthopedics;  Laterality: Right;   Patient Active Problem List   Diagnosis Date Noted   Adjustment disorder with depressed mood  01/05/2023   Ankle swelling 11/06/2022   ICH (intracerebral hemorrhage) (HCC) 07/25/2022   H/O: hysterectomy 09/12/2021   Acute cystitis with hematuria 06/30/2019   History of total knee replacement, 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   Obesity (BMI 30-39.9) 05/11/2012   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 Patient states that they stumble a bit everyday but hasn't fallen. She hasn't been able to do the home exercise yet but is open to trying it at home. Pt accompanied by: self  PERTINENT HISTORY: anemia, depression, R TKA 2019, SSA, plantar fasciitis, CVA (2024)  PAIN:  Are you having pain? No  Vitals:   12/23/23 1110  BP: 120/71  Pulse: 77     PRECAUTIONS: Fall  PATIENT GOALS: to be to drive  OBJECTIVE:  Note: Objective measures were completed at Evaluation unless otherwise noted.  DIAGNOSTIC FINDINGS: 07/25/22 brain CT IMPRESSION: Unchanged large mixed intra- and extra-axial hematoma at the left frontal lobe with persistent rightward midline shift that measures 5 mm.                                                                                                                             TREATMENT Therapeutic Activity: -Scifit hills level 4 x 6 mins B UE/LE for dynamic warm up, reciprocal  patterning for gait, endurance for activity tolerance -10 meter walking with 3 pound ankle weight, cueing high knees and big steps, CGA  Therapeutic Exercise: -Tandem walking on foam balance beam in parallel bars, CGA x down&back x 4 -Standing calf raises at bar x 15 reps, HEP review, LE strengthening -Standing marches at bar, 2 sets x 20 reps total, HEP review, LE strengthening -Seated marching x 10 total reps, LE strengthening -Seated marching w/3 pound ankle weights x 20 total reps, LE strengthening -Standing marching w/3 pound ankle weights x 20 total reps, LE strengthening -Step ups alternating x 20 reps total, LE strengthening, balance with alternating steps -Lateral step ups x 10 reps each side, LE strengthening, single leg balance *CGA throughout but no loss of balance and minimal corrections needed *Exercise well tolerated with seated rest breaks needed throughout due to fatigue   PATIENT EDUCATION: Education details: HEP review, encouraging HEP completion N[place] educated: Patient Education method: Explanation and Demonstration Education comprehension: verbalized understanding and needs further education  HOME EXERCISE PROGRAM: -get outside 1x/ day  -attend 1 line dancing class Access Code: [email or web address] URL: [email or web address] Date: 12/16/2023 Prepared by: Delon Pop  Exercises - Sit to Stand  - 1 x daily - 7 x weekly - 3 sets - 6-8 reps - Mini Squat with Counter Support  - 1 x daily - 7 x weekly - 3 sets - 10 reps - Standing Hip Abduction with Counter Support  - 1 x daily - 7 x weekly - 3 sets - 10 reps - Heel Raises with Counter Support  - 1 x daily - 7 x weekly - 3 sets - 10 reps - Standing March with Counter Support  - 1 x daily - 7 x weekly - 3 sets - 10 reps - Tandem Walking with Counter Support  - 1 x daily - 7 x weekly - 3 sets - 10 reps  GOALS:  Goals reviewed with patient? Yes  SHORT TERM GOALS: = LTG based on PT POC length   LONG TERM  GOALS: Target date: 01/07/24  Pt will be independent with final HEP for improved functional strength and activity tolerance  Baseline: to be provided Goal status: INITIAL  2.  Pt will improve 5x STS to </= 13 sec to demo improved functional LE strength and balance   Baseline: 17s B UE Goal status: INITIAL  3.  Patient will attend at least 1 line dancing class during POC to demonstrate progress toward return to PLOF Baseline: not attending Goal status: INITIAL   ASSESSMENT:  CLINICAL IMPRESSION: Patient tolerated session well without any increase in pain or discomfort. Rest breaks needed throughout due to fatigue. Session focused on balance and and LE strengthening for improvement in ambulation and reducing falls. HEP was reviewed to encourage confidence and understanding for completion at home. Patient continued to have a flat affect and is withdrawn. She required CGA throughout. Continue POC.    OBJECTIVE IMPAIRMENTS: Abnormal gait, cardiopulmonary status limiting activity, decreased activity tolerance, and decreased strength.   ACTIVITY LIMITATIONS: squatting, stairs, locomotion level, and caring for others  PARTICIPATION LIMITATIONS: meal prep, cleaning, driving, shopping, community activity, and occupation  PERSONAL FACTORS: Age, Behavior pattern, Fitness, Past/current experiences, Time since onset of injury/illness/exacerbation, Transportation, and 3+ comorbidities: see above are also affecting patient's functional outcome.   REHAB POTENTIAL: Fair time since onset  CLINICAL DECISION MAKING: Stable/uncomplicated  EVALUATION COMPLEXITY: Low  PLAN:  PT FREQUENCY: 1x/week  PT DURATION: 4 weeks  PLANNED INTERVENTIONS: 97164- PT Re-evaluation, 97750- Physical Performance Testing, 97110-Therapeutic exercises, 97530- Therapeutic activity, [id number]- Neuromuscular re-education, 97535- Self Care, 02859- Manual therapy, [id number]- Gait training, [phone]- Orthotic Initial, [id number]-  Orthotic/Prosthetic subsequent, [phone]- Canalith repositioning, [id number]- Aquatic Therapy, [phone]- Electrical stimulation (manual), [phone] (1-2 muscles), 20561 (3+ muscles)- Dry Needling, Patient/Family education, Balance training, Stair training, Vestibular training, Visual/preceptual remediation/compensation, and DME instructions  PLAN FOR NEXT SESSION: higher level balance, enjoys line dancing! Blaze pods for reaction time, continue hip flexor strengthening both in sitting and standing   Emmalene Sherry, Student-PT Delon DELENA Pop, PT, DPT, CBIS  12/23/2023, 1:56 PM

## 2023-12-27 ENCOUNTER — Encounter: Payer: Self-pay | Admitting: Family Medicine

## 2023-12-27 ENCOUNTER — Ambulatory Visit (INDEPENDENT_AMBULATORY_CARE_PROVIDER_SITE_OTHER): Payer: Self-pay | Admitting: Family Medicine

## 2023-12-27 VITALS — BP 118/80 | HR 74 | Ht 66.0 in | Wt 177.0 lb

## 2023-12-27 DIAGNOSIS — F331 Major depressive disorder, recurrent, moderate: Secondary | ICD-10-CM
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
## 2023-12-30 ENCOUNTER — Ambulatory Visit: Admitting: Physical Therapy

## 2023-12-30 VITALS — BP 129/79 | HR 66

## 2023-12-30 DIAGNOSIS — M6281 Muscle weakness (generalized): Secondary | ICD-10-CM

## 2023-12-30 DIAGNOSIS — R2689 Other abnormalities of gait and mobility: Secondary | ICD-10-CM | POA: Diagnosis not present

## 2023-12-30 DIAGNOSIS — R2681 Unsteadiness on feet: Secondary | ICD-10-CM
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
## 2023-12-31 ENCOUNTER — Other Ambulatory Visit: Payer: Self-pay | Admitting: Licensed Clinical Social Worker

## 2023-12-31 ENCOUNTER — Ambulatory Visit (AMBULATORY_SURGERY_CENTER)

## 2023-12-31 VITALS — Ht 66.0 in | Wt 180.6 lb

## 2023-12-31 DIAGNOSIS — Z860100 Personal history of colon polyps, unspecified: Secondary | ICD-10-CM

## 2023-12-31 MED ORDER — NA SULFATE-K SULFATE-MG SULF 17.5-3.13-1.6 GM/177ML PO SOLN: 1.0000 | Freq: Once | ORAL | 0 refills | Status: AC

## 2023-12-31 NOTE — Progress Notes (Signed)
 No egg or soy allergy known to patient  No issues known to pt with past sedation with any surgeries or procedures Patient denies ever being told they had issues or difficulty with intubation  No FH of Malignant Hyperthermia Pt is not on diet pills Pt is not on  home 02  Pt is not on blood thinners  Pt denies issues with constipation- yes  No A fib or A flutter Have any cardiac testing pending--NO Pt can ambulate- independently Pt denies use of chewing tobacco Discussed diabetic I weight loss medication holds Discussed NSAID holds Checked BMI Pt instructed to use Singlecare.com or GoodRx for a price reduction on prep  Patient's chart reviewed by Norleen Schillings CNRA prior to previsit and patient appropriate for the [facility].  Pre visit completed and red dot placed by patient's name on their procedure day (on provider's schedule).

## 2024-01-05 NOTE — Patient Instructions (Signed)
 Visit Information  Ms. Anna Berry was given information about Medicaid Managed Care team care coordination services as a part of their Healthy [facility] Medicaid benefit. Anna Berry   If you would like to schedule transportation through your Healthy [facility] plan, please call the following number at least 2 days in advance of your appointment: [phone]  For information about your ride after you set it up, call Ride Assist at [phone]. Use this number to activate a Will Call pickup, or if your transportation is late for a scheduled pickup. Use this number, too, if you need to make a change or cancel a previously scheduled reservation.  If you need transportation services right away, call [phone]. The after-hours call center is staffed 24 hours to handle ride assistance and urgent reservation requests (including discharges) 365 days a year. Urgent trips include sick visits, hospital discharge requests and life-sustaining treatment.  Call the [facility] Line at [phone], at any time, 24 hours a day, 7 days a week. If you are in danger or need immediate medical attention call 911.   Please see education materials related to topics discussed provided by MyChart link.  Patient verbalizes understanding of instructions and care plan provided today and agrees to view in MyChart. Active MyChart status and patient understanding of how to access instructions and care plan via MyChart confirmed with patient.     Licensed Clinical Social Worker will call on 11/12 at 12:30 PM  Anna Kerns, LCSW [place]  [facility], [facility] Clinical Social Worker Direct Dial: [phone]  Fax: [phone] Website: [email or web address] 5:33 AM   Following is a copy of your plan of care:  There are no care plans that you recently modified to display for this patient.

## 2024-01-05 NOTE — Patient Outreach (Signed)
 LCSW attempted to complete initial; however, family was not available. Will re-schedule.

## 2024-01-06 ENCOUNTER — Ambulatory Visit

## 2024-01-13 ENCOUNTER — Ambulatory Visit

## 2024-01-14 ENCOUNTER — Ambulatory Visit (AMBULATORY_SURGERY_CENTER): Admitting: Gastroenterology

## 2024-01-14 ENCOUNTER — Encounter: Payer: Self-pay | Admitting: Gastroenterology

## 2024-01-14 VITALS — BP 134/70 | HR 68 | Temp 97.5°F | Resp 13 | Ht 66.0 in | Wt 180.6 lb

## 2024-01-14 DIAGNOSIS — D120 Benign neoplasm of cecum: Secondary | ICD-10-CM | POA: Diagnosis not present

## 2024-01-14 DIAGNOSIS — Z1211 Encounter for screening for malignant neoplasm of colon: Secondary | ICD-10-CM

## 2024-01-14 DIAGNOSIS — Z860101 Personal history of adenomatous and serrated colon polyps: Secondary | ICD-10-CM | POA: Diagnosis not present

## 2024-01-14 DIAGNOSIS — Z860100 Personal history of colon polyps, unspecified: Secondary | ICD-10-CM

## 2024-01-14 DIAGNOSIS — D122 Benign neoplasm of ascending colon: Secondary | ICD-10-CM | POA: Diagnosis not present

## 2024-01-14 MED ORDER — SODIUM CHLORIDE 0.9 % IV SOLN: 500.0000 mL | Freq: Once | INTRAVENOUS | Status: DC

## 2024-01-14 MED ORDER — NA SULFATE-K SULFATE-MG SULF 17.5-3.13-1.6 GM/177ML PO SOLN: 1.0000 | Freq: Once | ORAL | 0 refills | Status: AC

## 2024-01-14 NOTE — Progress Notes (Signed)
 [name] states no medical or surgical changes since previsit or office visit.

## 2024-01-14 NOTE — Op Note (Signed)
 [facility] Patient Name: Anna Berry Procedure Date: 01/14/2024 10:20 AM MRN: [id number] Endoscopist: Gustav ALONSO Mcgee , MD, [id number] Age: 63 Referring MD:  Date of Birth: Oct 22, 1960 Gender: Female Account #: [id number] Procedure:                Colonoscopy Indications:              High risk colon cancer surveillance: Personal                            history of adenoma (10 mm or greater in size), High                            risk colon cancer surveillance: Personal history of                            multiple (3 or more) adenomas Medicines:                Monitored Anesthesia Care Procedure:                Pre-Anesthesia Assessment:                           - Prior to the procedure, a History and Physical                            was performed, and patient medications and                            allergies were reviewed. The patient's tolerance of                            previous anesthesia was also reviewed. The risks                            and benefits of the procedure and the sedation                            options and risks were discussed with the patient.                            All questions were answered, and informed consent                            was obtained. Prior Anticoagulants: The patient has                            taken no anticoagulant or antiplatelet agents. ASA                            Grade Assessment: II - A patient with mild systemic                            disease. After reviewing the risks and benefits,  the patient was deemed in satisfactory condition to                            undergo the procedure.                           After obtaining informed consent, the colonoscope                            was passed under direct vision. Throughout the                            procedure, the patient's blood pressure, pulse, and                            oxygen saturations  were monitored continuously. The                            Olympus Scope [id number] was introduced through the                            anus and advanced to the the cecum, identified by                            appendiceal orifice and ileocecal valve. The                            colonoscopy was performed without difficulty. The                            patient tolerated the procedure well. The quality                            of the bowel preparation was poor and 70 percent                            obscured. The ileocecal valve, appendiceal orifice,                            and rectum were photographed. Scope In: 10:52:11 AM Scope Out: 11:05:19 AM Scope Withdrawal Time: 0 hours 9 minutes 14 seconds  Total Procedure Duration: 0 hours 13 minutes 8 seconds  Findings:                 The perianal and digital rectal examinations were                            normal.                           Two sessile polyps were found in the ascending                            colon and cecum. The polyps were 2 to 3 mm in size.  These polyps were removed with a cold snare.                            Resection was complete, but the polyp tissue was                            only partially retrieved.                           A moderate amount of stool was found in the sigmoid                            colon, in the descending colon, in the transverse                            colon, in the ascending colon and in the cecum,                            making visualization difficult. Lavage of the area                            was performed, resulting in incomplete clearance                            with continued poor visualization. Complications:            No immediate complications. Estimated Blood Loss:     Estimated blood loss was minimal. Impression:               - Preparation of the colon was poor.                           - Two 2 to 3 mm polyps in the  ascending colon and                            in the cecum, removed with a cold snare. Complete                            resection. Partial retrieval.                           - Stool in the sigmoid colon, in the descending                            colon, in the transverse colon, in the ascending                            colon and in the cecum. Recommendation:           - Resume previous diet.                           - Continue present medications.                           -  Await pathology results.                           - Repeat colonoscopy at the next available                            appointment because the bowel preparation was                            suboptimal. Anna Berry V. Lynette Noah, MD 01/14/2024 11:10:56 AM This report has been signed electronically.

## 2024-01-14 NOTE — Progress Notes (Signed)
 To pacu, VSS. Report to [email or web address]

## 2024-01-14 NOTE — Progress Notes (Signed)
 Poor prep. Pt admits she did not take the daily Miralax  as instructed. Also found that pt drank both bottles of Suprep the night before the procedure and only drank water  the day of the procedure. Reviewed instructions in detail with the pt and her care partner for a 2 day Miralax /Suprep bowel preparation. Rescheduled pt for Monday 01/17/23 at 12:30PM. Sent prescriptions for pt's pharmacy. Printed instruction provided.

## 2024-01-14 NOTE — Patient Instructions (Signed)
 YOU HAD AN ENDOSCOPIC PROCEDURE TODAY AT THE [facility]:   Refer to the procedure report that was given to you for any specific questions about what was found during the examination.  If the procedure report does not answer your questions, please call your gastroenterologist to clarify.  If you requested that your care partner not be given the details of your procedure findings, then the procedure report has been included in a sealed envelope for you to review at your convenience later.  YOU SHOULD EXPECT: Some feelings of bloating in the abdomen. Passage of more gas than usual.  Walking can help get rid of the air that was put into your GI tract during the procedure and reduce the bloating. If you had a lower endoscopy (such as a colonoscopy or flexible sigmoidoscopy) you may notice spotting of blood in your stool or on the toilet paper. If you underwent a bowel prep for your procedure, you may not have a normal bowel movement for a few days.  Please Note:  You might notice some irritation and congestion in your nose or some drainage.  This is from the oxygen used during your procedure.  There is no need for concern and it should clear up in a day or so.  SYMPTOMS TO REPORT IMMEDIATELY:  Following lower endoscopy (colonoscopy or flexible sigmoidoscopy):  Excessive amounts of blood in the stool  Significant tenderness or worsening of abdominal pains  Swelling of the abdomen that is new, acute  Fever of 100F or higher  Resume previous diet Continue present medications Await pathology results Repeat colonoscopy at the next available appointment because the bowel preparation was suboptimal Handout on polyps given   For urgent or emergent issues, a gastroenterologist can be reached at any hour by calling [phone]. Do not use MyChart messaging for urgent concerns.    DIET:  We do recommend a small meal at first, but then you may proceed to your regular diet.  Drink plenty of  fluids but you should avoid alcoholic beverages for 24 hours.  ACTIVITY:  You should plan to take it easy for the rest of today and you should NOT DRIVE or use heavy machinery until tomorrow (because of the sedation medicines used during the test).    FOLLOW UP: Our staff will call the number listed on your records the next business day following your procedure.  We will call around 7:15- 8:00 am to check on you and address any questions or concerns that you may have regarding the information given to you following your procedure. If we do not reach you, we will leave a message.     If any biopsies were taken you will be contacted by phone or by letter within the next 1-3 weeks.  Please call us  at [phone] if you have not heard about the biopsies in 3 weeks.    SIGNATURES/CONFIDENTIALITY: You and/or your care partner have signed paperwork which will be entered into your electronic medical record.  These signatures attest to the fact that that the information above on your After Visit Summary has been reviewed and is understood.  Full responsibility of the confidentiality of this discharge information lies with you and/or your care-partner.

## 2024-01-14 NOTE — Progress Notes (Signed)
 [facility] History and Physical   Primary Care Physician:  Romelle Booty, MD   Reason for Procedure:  History of adenomatous colon polyps  Plan:    Surveillance colonoscopy with possible interventions as needed     HPI: Anna Berry is a very pleasant 63 y.o. female here for surveillance colonoscopy. Denies any nausea, vomiting, abdominal pain, melena or bright red blood per rectum  The risks and benefits as well as alternatives of endoscopic procedure(s) have been discussed and reviewed.  The patient was provided an opportunity to ask questions and all were answered. The patient agreed with the plan and demonstrated an understanding of the instructions.   Past Medical History:  Diagnosis Date   Allergy    Anemia    Arthritis    Asthma    allergies induced   Bilateral lower extremity edema    Depression    Fibroids 08/2002   Gross hematuria 05/2010   History of appendicitis    History of colon polyps 2017   Hyperlipidemia    Multiple renal cysts 05/24/2008   Two left renal parapelvic cysts.   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    Past Surgical History:  Procedure Laterality Date   ABDOMINAL HYSTERECTOMY  2004   APPENDECTOMY  2006   CESAREAN SECTION  1981, 1987   x2   COLONOSCOPY     COLONOSCOPY W/ POLYPECTOMY  09/26/2015   IR GASTROSTOMY TUBE MOD SED  08/12/2022   IR GASTROSTOMY TUBE REMOVAL  11/19/2022   POLYPECTOMY     TOTAL KNEE ARTHROPLASTY Right 07/29/2018   Procedure: RIGHT TOTAL KNEE ARTHROPLASTY;  Surgeon: Jerri Kay HERO, MD;  Location: [facility];  Service: Orthopedics;  Laterality: Right;    Prior to Admission medications   Medication Sig Start Date End Date  Taking? Authorizing Provider  acetaminophen  (TYLENOL ) 500 MG tablet Take 1,000 mg by mouth daily as needed for moderate pain. Patient not taking: Reported on 12/31/2023    [provider]  albuterol  (VENTOLIN  HFA) 108 (90 Base) MCG/ACT inhaler Inhale 2 puffs into the lungs every 6 (six) hours as needed for wheezing or shortness of breath. 06/26/22   Malvina Ellen, MD  famotidine  (PEPCID ) 20 MG tablet Place 1 tablet (20 mg total) into feeding tube daily. Patient not taking: Reported on 12/31/2023 08/19/22   Akula, Vijaya, MD  loratadine  (CLARITIN ) 10 MG tablet Take 10 mg by mouth daily as needed for allergies.     [provider]  oxyCODONE -acetaminophen  (PERCOCET/ROXICET) 5-325 MG tablet Take 1 tablet by mouth every 6 (six) hours as needed for severe pain (pain score 7-10). Patient not taking: Reported on 12/31/2023 09/29/23   Mannie Pac T, DO  polyethylene glycol powder (GLYCOLAX /MIRALAX ) 17 GM/SCOOP powder Take 17 g by mouth 2 (two) times daily as needed for mild constipation. Patient not taking: Reported on 12/31/2023 08/18/22   Akula, Vijaya, MD  rosuvastatin  (CRESTOR ) 20 MG tablet Take 1 tablet (20 mg total) by mouth daily. Patient not taking: Reported on 12/31/2023 10/13/22   Romelle Booty, MD  sennosides (SENOKOT) 8.8 MG/5ML syrup Place 5 mLs  into feeding tube at bedtime as needed for mild constipation. Patient not taking: Reported on 12/31/2023 08/18/22   Akula, Vijaya, MD  Tetrahydrozoline HCl (VISINE OP) Place 1 drop into both eyes daily as needed (allergies). Patient not taking: Reported on 12/31/2023    [provider]  white petrolatum  (VASELINE) OINT Apply 1 Application topically as needed for lip care. Patient not taking: Reported on 12/31/2023 08/14/22   Cherlyn Labella, MD    Current Outpatient Medications  Medication Sig Dispense Refill   acetaminophen  (TYLENOL ) 500 MG tablet Take 1,000 mg by mouth daily as needed for moderate pain. (Patient not taking:  Reported on 12/31/2023)     albuterol  (VENTOLIN  HFA) 108 (90 Base) MCG/ACT inhaler Inhale 2 puffs into the lungs every 6 (six) hours as needed for wheezing or shortness of breath. 6.7 g 0   famotidine  (PEPCID ) 20 MG tablet Place 1 tablet (20 mg total) into feeding tube daily. (Patient not taking: Reported on 12/31/2023) 30 tablet 0   loratadine  (CLARITIN ) 10 MG tablet Take 10 mg by mouth daily as needed for allergies.      oxyCODONE -acetaminophen  (PERCOCET/ROXICET) 5-325 MG tablet Take 1 tablet by mouth every 6 (six) hours as needed for severe pain (pain score 7-10). (Patient not taking: Reported on 12/31/2023) 8 tablet 0   polyethylene glycol powder (GLYCOLAX /MIRALAX ) 17 GM/SCOOP powder Take 17 g by mouth 2 (two) times daily as needed for mild constipation. (Patient not taking: Reported on 12/31/2023)     rosuvastatin  (CRESTOR ) 20 MG tablet Take 1 tablet (20 mg total) by mouth daily. (Patient not taking: Reported on 12/31/2023) 90 tablet 3   sennosides (SENOKOT) 8.8 MG/5ML syrup Place 5 mLs into feeding tube at bedtime as needed for mild constipation. (Patient not taking: Reported on 12/31/2023) 240 mL 0   Tetrahydrozoline HCl (VISINE OP) Place 1 drop into both eyes daily as needed (allergies). (Patient not taking: Reported on 12/31/2023)     white petrolatum  (VASELINE) OINT Apply 1 Application topically as needed for lip care. (Patient not taking: Reported on 12/31/2023)  0   Current Facility-Administered Medications  Medication Dose Route Frequency Provider Last Rate Last Admin   0.9 %  sodium chloride  infusion  500 mL Intravenous Once Calvin Jablonowski V, MD        Allergies as of 01/14/2024 - Review Complete 01/14/2024  Allergen Reaction Noted   Acetaminophen  Other (See Comments) 06/03/2010   Morphine and codeine Nausea And Vomiting 03/01/2013   Nsaids Other (See Comments) 10/11/2009   Other Itching and Swelling 07/21/2018    Family History  Problem Relation Age of Onset   Diabetes  Father    Kidney disease Father    Stomach cancer Maternal Grandmother 25   Stroke Maternal Grandmother    Colon cancer Other    Colon polyps Neg Hx    Esophageal cancer Neg Hx    Rectal cancer Neg Hx     Social History   Socioeconomic History   Marital status: Married    Spouse name: Not on file   Number of children: Not on file   Years of education: Not on file   Highest education level: Not on file  Occupational History   Not on file  Tobacco Use   Smoking status: Never    Passive exposure: Never   Smokeless tobacco: Never  Vaping Use   Vaping status: Never Used  Substance and Sexual Activity   Alcohol  use: No    Alcohol /week: 0.0 standard drinks of alcohol   Drug use: No   Sexual activity: Yes    Birth control/protection: Surgical  Other Topics Concern   Not on file  Social History Narrative   Not on file   Social Drivers of Health   Financial Resource Strain: Not on file  Food Insecurity: Patient Unable To Answer (07/25/2022)   Hunger Vital Sign    Worried About Running Out of Food in the Last Year: Patient unable to answer    Ran Out of Food in the Last Year: Patient unable to answer  Transportation Needs: Patient Unable To Answer (07/25/2022)   PRAPARE - Transportation    Lack of Transportation (Medical): Patient unable to answer    Lack of Transportation (Non-Medical): Patient unable to answer  Physical Activity: Not on file  Stress: Not on file  Social Connections: Not on file  Intimate Partner Violence: Patient Unable To Answer (07/25/2022)   Humiliation, Afraid, Rape, and Kick questionnaire    Fear of Current or Ex-Partner: Patient unable to answer    Emotionally Abused: Patient unable to answer    Physically Abused: Patient unable to answer    Sexually Abused: Patient unable to answer    Review of Systems:  All other review of systems negative except as mentioned in the HPI.  Physical Exam: Vital signs in last 24 hours: BP (!) 159/84   Pulse  68   Temp (!) 97.5 F (36.4 C)   Ht 5' 6 (1.676 m)   Wt 180 lb 9.6 oz (81.9 kg)   SpO2 100%   BMI 29.15 kg/m  General:   Alert, NAD Lungs:  Clear .   Heart:  Regular rate and rhythm Abdomen:  Soft, nontender and nondistended. Neuro/Psych:  Alert and cooperative. Normal mood and affect. A and O x 3  Reviewed labs, radiology imaging, old records and pertinent past GI work up  Patient is appropriate for planned procedure(s) and anesthesia in an ambulatory setting   K. Veena Nguyen Todorov , MD [phone]

## 2024-01-17 ENCOUNTER — Ambulatory Visit (AMBULATORY_SURGERY_CENTER): Admitting: Gastroenterology

## 2024-01-17 ENCOUNTER — Encounter: Payer: Self-pay | Admitting: Gastroenterology

## 2024-01-17 VITALS — BP 123/62 | HR 80 | Temp 97.4°F | Resp 13 | Ht 66.0 in | Wt 180.0 lb

## 2024-01-17 DIAGNOSIS — D123 Benign neoplasm of transverse colon: Secondary | ICD-10-CM

## 2024-01-17 DIAGNOSIS — Z1211 Encounter for screening for malignant neoplasm of colon: Secondary | ICD-10-CM | POA: Diagnosis not present

## 2024-01-17 DIAGNOSIS — D125 Benign neoplasm of sigmoid colon: Secondary | ICD-10-CM | POA: Diagnosis not present

## 2024-01-17 DIAGNOSIS — K635 Polyp of colon: Secondary | ICD-10-CM | POA: Diagnosis not present

## 2024-01-17 DIAGNOSIS — D122 Benign neoplasm of ascending colon: Secondary | ICD-10-CM | POA: Diagnosis not present

## 2024-01-17 DIAGNOSIS — K644 Residual hemorrhoidal skin tags: Secondary | ICD-10-CM

## 2024-01-17 DIAGNOSIS — Z860101 Personal history of adenomatous and serrated colon polyps: Secondary | ICD-10-CM

## 2024-01-17 DIAGNOSIS — K648 Other hemorrhoids: Secondary | ICD-10-CM

## 2024-01-17 DIAGNOSIS — Z860100 Personal history of colon polyps, unspecified: Secondary | ICD-10-CM

## 2024-01-17 DIAGNOSIS — K5730 Diverticulosis of large intestine without perforation or abscess without bleeding: Secondary | ICD-10-CM

## 2024-01-17 MED ORDER — SODIUM CHLORIDE 0.9 % IV SOLN: 500.0000 mL | Freq: Once | INTRAVENOUS | Status: DC

## 2024-01-17 NOTE — Patient Instructions (Signed)

## 2024-01-17 NOTE — Progress Notes (Unsigned)
 Sedate, gd SR, tolerated procedure well, VSS, report to RN

## 2024-01-17 NOTE — Progress Notes (Unsigned)
 [name] states no medical or surgical changes since previsit or office visit.

## 2024-01-17 NOTE — Op Note (Signed)
 [facility] Patient Name: Anna Berry Procedure Date: 01/17/2024 1:19 PM MRN: [id number] Endoscopist: Gustav ALONSO Mcgee , MD, [id number] Age: 63 Referring MD:  Date of Birth: 05/27/1960 Gender: Female Account #: [id number] Procedure:                Colonoscopy Indications:              High risk colon cancer surveillance: Personal                            history of adenoma (10 mm or greater in size), High                            risk colon cancer surveillance: Personal history of                            multiple (3 or more) adenomas Medicines:                Monitored Anesthesia Care Procedure:                Pre-Anesthesia Assessment:                           - Prior to the procedure, a History and Physical                            was performed, and patient medications and                            allergies were reviewed. The patient's tolerance of                            previous anesthesia was also reviewed. The risks                            and benefits of the procedure and the sedation                            options and risks were discussed with the patient.                            All questions were answered, and informed consent                            was obtained. Prior Anticoagulants: The patient has                            taken no anticoagulant or antiplatelet agents. ASA                            Grade Assessment: II - A patient with mild systemic                            disease. After reviewing the risks and benefits,  the patient was deemed in satisfactory condition to                            undergo the procedure.                           After obtaining informed consent, the colonoscope                            was passed under direct vision. Throughout the                            procedure, the patient's blood pressure, pulse, and                            oxygen saturations  were monitored continuously. The                            Olympus Scope SN [id number] was introduced through the                            anus and advanced to the the cecum, identified by                            appendiceal orifice and ileocecal valve. The                            colonoscopy was performed without difficulty. The                            patient tolerated the procedure well. The quality                            of the bowel preparation was good. The ileocecal                            valve, appendiceal orifice, and rectum were                            photographed. Scope In: 1:35:15 PM Scope Out: 1:50:18 PM Scope Withdrawal Time: 0 hours 10 minutes 37 seconds  Total Procedure Duration: 0 hours 15 minutes 3 seconds  Findings:                 The perianal and digital rectal examinations were                            normal.                           A 1 mm polyp was found in the ascending colon. The                            polyp was sessile. The polyp was removed with a  cold biopsy forceps. Resection and retrieval were                            complete.                           Two sessile polyps were found in the sigmoid colon                            and transverse colon. The polyps were 4 to 5 mm in                            size. These polyps were removed with a cold snare.                            Resection and retrieval were complete.                           Scattered large-mouthed, medium-mouthed and                            small-mouthed diverticula were found in the sigmoid                            colon and ascending colon.                           Non-bleeding external and internal hemorrhoids were                            found during retroflexion. The hemorrhoids were                            medium-sized. Complications:            No immediate complications. Estimated Blood Loss:     Estimated  blood loss was minimal. Impression:               - One 1 mm polyp in the ascending colon, removed                            with a cold biopsy forceps. Resected and retrieved.                           - Two 4 to 5 mm polyps in the sigmoid colon and in                            the transverse colon, removed with a cold snare.                            Resected and retrieved.                           - Moderate diverticulosis in the sigmoid colon and  in the ascending colon.                           - Non-bleeding external and internal hemorrhoids. Recommendation:           - Resume previous diet.                           - Continue present medications.                           - Await pathology results.                           - Repeat colonoscopy in 3 - 5 years for                            surveillance based on pathology results. Anna Gitto V. Nida Manfredi, MD 01/17/2024 1:56:55 PM This report has been signed electronically.

## 2024-01-17 NOTE — Progress Notes (Unsigned)
 Called to room to assist during endoscopic procedure.  Patient ID and intended procedure confirmed with present staff. Received instructions for my participation in the procedure from the performing physician.

## 2024-01-17 NOTE — Progress Notes (Unsigned)
 [facility] History and Physical   Primary Care Physician:  Romelle Booty, MD   Reason for Procedure:  History of adenomatous colon polyps  Plan:    Surveillance colonoscopy with possible interventions as needed     HPI: Anna Berry is a very pleasant 63 y.o. female here for surveillance colonoscopy. Denies any nausea, vomiting, abdominal pain, melena or bright red blood per rectum  The risks and benefits as well as alternatives of endoscopic procedure(s) have been discussed and reviewed.  The patient was provided an opportunity to ask questions and all were answered. The patient agreed with the plan and demonstrated an understanding of the instructions.   Past Medical History:  Diagnosis Date   Allergy    Anemia    Arthritis    Asthma    allergies induced   Bilateral lower extremity edema    Depression    Fibroids 08/2002   Gross hematuria 05/2010   History of appendicitis    History of colon polyps 2017   Hyperlipidemia    Multiple renal cysts 05/24/2008   Two left renal parapelvic cysts.   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    Past Surgical History:  Procedure Laterality Date   ABDOMINAL HYSTERECTOMY  2004   APPENDECTOMY  2006   CESAREAN SECTION  1981, 1987   x2   COLONOSCOPY     COLONOSCOPY W/ POLYPECTOMY  09/26/2015   IR GASTROSTOMY TUBE MOD SED  08/12/2022   IR GASTROSTOMY TUBE REMOVAL  11/19/2022   POLYPECTOMY     TOTAL KNEE ARTHROPLASTY Right 07/29/2018   Procedure: RIGHT TOTAL KNEE ARTHROPLASTY;  Surgeon: Jerri Kay HERO, MD;  Location: [facility];  Service: Orthopedics;  Laterality: Right;    Prior to Admission medications   Medication Sig Start Date End Date  Taking? Authorizing Provider  acetaminophen  (TYLENOL ) 500 MG tablet Take 1,000 mg by mouth daily as needed for moderate pain (pain score 4-6).    [provider]  albuterol  (VENTOLIN  HFA) 108 (90 Base) MCG/ACT inhaler Inhale 2 puffs into the lungs every 6 (six) hours as needed for wheezing or shortness of breath. 06/26/22   Malvina Ellen, MD  loratadine  (CLARITIN ) 10 MG tablet Take 10 mg by mouth daily as needed for allergies.     [provider]  polyethylene glycol powder (GLYCOLAX /MIRALAX ) 17 GM/SCOOP powder Take 17 g by mouth 2 (two) times daily as needed for mild constipation. Patient not taking: Reported on 12/31/2023 08/18/22   Akula, Vijaya, MD  rosuvastatin  (CRESTOR ) 20 MG tablet Take 1 tablet (20 mg total) by mouth daily. Patient not taking: Reported on 12/31/2023 10/13/22   Romelle Booty, MD  sennosides (SENOKOT) 8.8 MG/5ML syrup Place 5 mLs into feeding tube at bedtime as needed for mild constipation. Patient not taking: Reported on 12/31/2023 08/18/22   Akula, Vijaya, MD  Tetrahydrozoline HCl (VISINE OP) Place 1 drop into both eyes daily as needed (allergies). Patient not taking: Reported on 12/31/2023    [provider]    Current Outpatient Medications  Medication Sig Dispense Refill   acetaminophen  (TYLENOL ) 500 MG tablet  Take 1,000 mg by mouth daily as needed for moderate pain (pain score 4-6).     albuterol  (VENTOLIN  HFA) 108 (90 Base) MCG/ACT inhaler Inhale 2 puffs into the lungs every 6 (six) hours as needed for wheezing or shortness of breath. 6.7 g 0   loratadine  (CLARITIN ) 10 MG tablet Take 10 mg by mouth daily as needed for allergies.      polyethylene glycol powder (GLYCOLAX /MIRALAX ) 17 GM/SCOOP powder Take 17 g by mouth 2 (two) times daily as needed for mild constipation. (Patient not taking: Reported on 12/31/2023)     rosuvastatin  (CRESTOR ) 20 MG tablet Take 1 tablet (20 mg total) by mouth daily. (Patient not taking: Reported on 12/31/2023) 90  tablet 3   sennosides (SENOKOT) 8.8 MG/5ML syrup Place 5 mLs into feeding tube at bedtime as needed for mild constipation. (Patient not taking: Reported on 12/31/2023) 240 mL 0   Tetrahydrozoline HCl (VISINE OP) Place 1 drop into both eyes daily as needed (allergies). (Patient not taking: Reported on 12/31/2023)     Current Facility-Administered Medications  Medication Dose Route Frequency Provider Last Rate Last Admin   0.9 %  sodium chloride  infusion  500 mL Intravenous Once Darrielle Pflieger V, MD        Allergies as of 01/17/2024 - Review Complete 01/17/2024  Allergen Reaction Noted   Acetaminophen  Other (See Comments) 06/03/2010   Morphine and codeine Nausea And Vomiting 03/01/2013   Nsaids Other (See Comments) 10/11/2009   Other Itching and Swelling 07/21/2018    Family History  Problem Relation Age of Onset   Diabetes Father    Kidney disease Father    Stomach cancer Maternal Grandmother 9   Stroke Maternal Grandmother    Colon cancer Other    Colon polyps Neg Hx    Esophageal cancer Neg Hx    Rectal cancer Neg Hx     Social History   Socioeconomic History   Marital status: Married    Spouse name: Not on file   Number of children: Not on file   Years of education: Not on file   Highest education level: Not on file  Occupational History   Not on file  Tobacco Use   Smoking status: Never    Passive exposure: Never   Smokeless tobacco: Never  Vaping Use   Vaping status: Never Used  Substance and Sexual Activity   Alcohol  use: No    Alcohol /week: 0.0 standard drinks of alcohol    Drug use: No   Sexual activity: Yes    Birth control/protection: Surgical  Other Topics Concern   Not on file  Social History Narrative   Not on file   Social Drivers of Health   Financial Resource Strain: Not on file  Food Insecurity: Patient Unable To Answer (07/25/2022)   Hunger Vital Sign    Worried About Running Out of Food in the Last Year: Patient unable to answer    Ran  Out of Food in the Last Year: Patient unable to answer  Transportation Needs: Patient Unable To Answer (07/25/2022)   PRAPARE - Transportation    Lack of Transportation (Medical): Patient unable to answer    Lack of Transportation (Non-Medical): Patient unable to answer  Physical Activity: Not on file  Stress: Not on file  Social Connections: Not on file  Intimate Partner Violence: Patient Unable To Answer (07/25/2022)   Humiliation, Afraid, Rape, and Kick questionnaire    Fear of Current or Ex-Partner: Patient unable to answer    Emotionally  Abused: Patient unable to answer    Physically Abused: Patient unable to answer    Sexually Abused: Patient unable to answer    Review of Systems:  All other review of systems negative except as mentioned in the HPI.  Physical Exam: Vital signs in last 24 hours: BP 128/78   Pulse (!) 58   Temp (!) 97.4 F (36.3 C) (Temporal)   Resp 12   Ht 5' 6 (1.676 m)   Wt 180 lb (81.6 kg)   SpO2 (!) 84%   BMI 29.05 kg/m  General:   Alert, NAD Lungs:  Clear .   Heart:  Regular rate and rhythm Abdomen:  Soft, nontender and nondistended. Neuro/Psych:  Alert and cooperative. Normal mood and affect. A and O x 3  Reviewed labs, radiology imaging, old records and pertinent past GI work up  Patient is appropriate for planned procedure(s) and anesthesia in an ambulatory setting   K. Veena Jarrah Babich , MD [phone]

## 2024-01-18 ENCOUNTER — Telehealth: Payer: Self-pay

## 2024-01-18 NOTE — Telephone Encounter (Signed)
  Follow up Call-     01/17/2024   12:54 PM 01/14/2024    9:54 AM  Call back number  Post procedure Call Back phone  # [phone]  Permission to leave phone message Yes Yes     Patient questions:  Do you have a fever, pain , or abdominal swelling? No. Pain Score  0 *  Have you tolerated food without any problems? Yes.    Have you been able to return to your normal activities? Yes.    Do you have any questions about your discharge instructions: Diet   No. Medications  No. Follow up visit  No.  Do you have questions or concerns about your Care? No.  Actions: * If pain score is 4 or above: No action needed, pain <4.

## 2024-01-19 LAB — SURGICAL PATHOLOGY

## 2024-01-20 ENCOUNTER — Ambulatory Visit: Payer: Self-pay | Admitting: Gastroenterology

## 2024-01-20 LAB — SURGICAL PATHOLOGY

## 2024-01-21 ENCOUNTER — Telehealth: Payer: Self-pay | Admitting: *Deleted

## 2024-01-21 NOTE — Telephone Encounter (Signed)
 Patient had a colonoscopy on 01/14/2024. Colonoscopy prep was poor.   Stool throughout the colon. She needs a 2 day prep.   Patient Acknowledgement form attached with the report in my procedure report folder  Dr Shila since this is going to be a 2 day prep does she need a pre--visit appointment? Thanks !

## 2024-01-24 NOTE — Telephone Encounter (Signed)
 She may have been scheduled already

## 2024-01-26 ENCOUNTER — Telehealth: Payer: Self-pay | Admitting: Licensed Clinical Social Worker

## 2024-01-26 ENCOUNTER — Ambulatory Visit: Payer: Self-pay | Admitting: Gastroenterology

## 2024-01-26 ENCOUNTER — Encounter: Payer: Self-pay | Admitting: Licensed Clinical Social Worker

## 2024-01-26 NOTE — Patient Instructions (Signed)
 Anna Berry - I am sorry I was unable to reach you today for our scheduled appointment. I work with Romelle Booty, MD and am calling to support your healthcare needs. Please contact me at [phone] at your earliest convenience. I look forward to speaking with you soon.   Thank you,  Rolin Kerns, LCSW [facility], [facility] Clinical Social Worker Direct Dial: [phone]  Fax: [phone] Website: [email or web address] 1:12 PM

## 2024-01-26 NOTE — Telephone Encounter (Signed)
 Do you wish for her to have a pre-visit to go over the instructions?

## 2024-01-27 NOTE — Telephone Encounter (Signed)
 Patient already had her repeat exam done, was already scheduled by [facility] and completed

## 2024-01-31 ENCOUNTER — Telehealth: Payer: Self-pay | Admitting: Adult Health

## 2024-01-31 NOTE — Telephone Encounter (Signed)
 Patient's husband called verify appointment time.

## 2024-02-02 LAB — LAB REPORT - SCANNED: EGFR: 59

## 2024-02-03 ENCOUNTER — Encounter: Payer: Self-pay | Admitting: Adult Health

## 2024-02-03 ENCOUNTER — Ambulatory Visit: Admitting: Adult Health

## 2024-02-03 VITALS — BP 149/81 | HR 69 | Wt 175.4 lb

## 2024-02-03 DIAGNOSIS — E785 Hyperlipidemia, unspecified: Secondary | ICD-10-CM | POA: Diagnosis not present

## 2024-02-03 DIAGNOSIS — I618 Other nontraumatic intracerebral hemorrhage: Secondary | ICD-10-CM | POA: Diagnosis not present

## 2024-02-03 NOTE — Progress Notes (Signed)
 PATIENT: Anna Berry DOB: 21-Jul-1960  REASON FOR VISIT: follow up HISTORY FROM: patient PRIMARY NEUROLOGIST: Dr. Rosemarie  Chief Complaint  Patient presents with   Follow-up    Pt in 5 with cousin Pt here for stroke f/u Pt states same since last office visit      HISTORY OF PRESENT ILLNESS: Today 02/03/24:  Anna Berry is a 63 y.o. 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 Returns today for follow-up.  Patient is here today with her cousin.  She feels like her symptoms have remained relatively stable.  She continues to have trouble with her speech but denies any weakness.  Her primary care is managing her stroke risk factors.  At the last visit we did order an MRI and an MRI of the brain however it appears the patient never had this done.  Looks like she was scheduled to have it done on August 27.  Cousin called to confirm with her husband and he states that she has not had any imaging.  She returns today for an evaluation.     10/14/22: Anna Berry is a 63 y.o. 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  Returns today for follow-up. Continues to have trouble with her speech- processing and expression. Weakness in the right arm. Passed swallowing test. Saw PCP yesterday and they reordered cholesterol medication. She is continuing with ST and PT at home. Husband is retired so he is at home with her.   HISTORY Ms. Anna Berry is a 63 y.o. female with history of sickle cell trait, diverticulosis, asthma admitted for speech difficulty. No tPA given due to ICH.     ICH: Initial left frontal ICH with left ACA distribution, concerning for hemorrhagic transformation.  Then new left frontal ICH lateral to previous hematoma, etiology unclear MRI brain left frontal ICH, within the ACA territory. CT head left frontal ICH along left ACA territory with overlying SAH.   Volume is approximately 18 ml CT head and neck unremarkable CTV no venous thrombosis 2D Echo LVEF 55-60%, no RWMA, normal LA size, no interatrial shunt detected. EEG no seizure LDL 105 HgbA1c 6.0 UDS negative Subcutaneous heparin  for VTE prophylaxis No antithrombotic prior to admission, now on No antithrombotic due to ICH. Will plan to repeat CTA and MRI in 4-6 weeks as outpt to rule out mass lesion or vascular multiple patient once blood absorbed Therapy recommendations: CIR Disposition: pending    REVIEW OF SYSTEMS: Out of a complete 14 system review of symptoms, the patient complains only of the following symptoms, and all other reviewed systems are negative.  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 (Patient taking differently: Take 1,000 mg by mouth as needed for moderate pain (pain score 4-6).)     albuterol  (VENTOLIN  HFA) 108 (90 Base) MCG/ACT inhaler Inhale 2 puffs into the lungs every 6 (six) hours as needed for wheezing or shortness of breath. 6.7  g 0   loratadine  (CLARITIN ) 10 MG tablet Take 10 mg by mouth daily as needed for allergies.  (Patient taking differently: Take 10 mg by mouth as needed for allergies.)     polyethylene glycol powder (GLYCOLAX /MIRALAX ) 17 GM/SCOOP powder Take 17 g by mouth 2 (two) times daily as needed for mild constipation. (Patient not taking: Reported on 12/31/2023)     rosuvastatin  (CRESTOR ) 20 MG tablet Take 1 tablet (20 mg total) by mouth daily. (Patient not taking: Reported on 02/03/2024) 90 tablet 3   sennosides  (SENOKOT) 8.8 MG/5ML syrup Place 5 mLs into feeding tube at bedtime as needed for mild constipation. (Patient not taking: Reported on 02/03/2024) 240 mL 0   Tetrahydrozoline HCl (VISINE OP) Place 1 drop into both eyes daily as needed (allergies). (Patient not taking: Reported on 02/03/2024)     No facility-administered medications prior to visit.    PAST MEDICAL HISTORY: Past Medical History:  Diagnosis Date   Allergy    Anemia    Arthritis    Asthma    allergies induced   Bilateral lower extremity edema    Depression    Fibroids 08/2002   Gross hematuria 05/2010   History of appendicitis    History of colon polyps 2017   Hyperlipidemia    Multiple renal cysts 05/24/2008   Two left renal parapelvic cysts.   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    PAST SURGICAL HISTORY: Past Surgical History:  Procedure Laterality Date   ABDOMINAL HYSTERECTOMY  2004   APPENDECTOMY  2006   CESAREAN SECTION  1981, 1987   x2   COLONOSCOPY     COLONOSCOPY W/ POLYPECTOMY  09/26/2015   IR GASTROSTOMY TUBE MOD SED  08/12/2022   IR GASTROSTOMY TUBE REMOVAL  11/19/2022   POLYPECTOMY     TOTAL KNEE ARTHROPLASTY Right 07/29/2018   Procedure: RIGHT TOTAL KNEE ARTHROPLASTY;  Surgeon: Jerri Kay HERO, MD;  Location: [facility];  Service: Orthopedics;  Laterality: Right;    FAMILY HISTORY: Family History  Problem Relation Age of Onset   Diabetes Father    Kidney disease Father    Stomach cancer Maternal Grandmother 1   Stroke Maternal Grandmother    Stroke Paternal Grandmother    Colon cancer Other    Colon polyps Neg Hx    Esophageal cancer Neg Hx    Rectal cancer Neg Hx     SOCIAL HISTORY: Social History    Socioeconomic History   Marital status: Married    Spouse name: Not on file   Number of children: Not on file   Years of education: Not on file   Highest education level: Not on file  Occupational History   Not on file  Tobacco Use   Smoking status: Never    Passive exposure: Never   Smokeless tobacco: Never  Vaping Use   Vaping status: Never Used  Substance and Sexual Activity   Alcohol  use: No    Alcohol /week: 0.0 standard drinks of alcohol    Drug use: No   Sexual activity: Yes    Birth control/protection: Surgical  Other  Topics Concern   Not on file  Social History Narrative   Pt lives with family   Not working    Social Drivers of Health   Financial Resource Strain: Not on file  Food Insecurity: Patient Unable To Answer (07/25/2022)   Hunger Vital Sign    Worried About Running Out of Food in the Last Year: Patient unable to answer    Ran Out of Food in the Last Year: Patient unable to answer  Transportation Needs: Patient Unable To Answer (07/25/2022)   PRAPARE - Transportation    Lack of Transportation (Medical): Patient unable to answer    Lack of Transportation (Non-Medical): Patient unable to answer  Physical Activity: Not on file  Stress: Not on file  Social Connections: Not on file  Intimate Partner Violence: Patient Unable To Answer (07/25/2022)   Humiliation, Afraid, Rape, and Kick questionnaire    Fear of Current or Ex-Partner: Patient unable to answer    Emotionally Abused: Patient unable to answer    Physically Abused: Patient unable to answer    Sexually Abused: Patient unable to answer      PHYSICAL EXAM  Vitals:   02/03/24 0949  BP: (!) 149/81  Pulse: 69  Weight: 175 lb 6.4 oz (79.6 kg)   Body mass index is 28.31 kg/m.  Generalized: Well developed, in no acute distress   Neurological examination  Mentation: Alert oriented to time, place, history taking. Follows all commands. Global asphasia Cranial nerve II-XII: Pupils were equal  round reactive to light. Extraocular movements were full, visual field were full on confrontational test. Facial sensation and strength were normal.  Head turning and shoulder shrug  were normal and symmetric. Motor: The motor testing reveals 5 over 5 strength in all extremities except right upper extremity 3/5. Good symmetric motor tone is noted throughout.  Sensory: Sensory testing is intact to soft touch on all 4 extremities. No evidence of extinction is noted.  Coordination: Cerebellar testing reveals good finger-nose-finger and heel-to-shin bilaterally.  Gait and station: Gait is normal.   DIAGNOSTIC DATA (LABS, IMAGING, TESTING) - I reviewed patient records, labs, notes, testing and imaging myself where available.  Lab Results  Component Value Date   WBC 7.4 10/17/2023   HGB 12.9 10/17/2023   HCT 40.0 10/17/2023   MCV 88.3 10/17/2023   PLT 303 10/17/2023      Component Value Date/Time   NA 145 10/17/2023 1530   NA 148 (H) 11/06/2022 1229   K 3.5 10/17/2023 1530   CL 109 10/17/2023 1530   CO2 28 10/17/2023 1530   GLUCOSE 104 (H) 10/17/2023 1530   BUN 5 (L) 10/17/2023 1530   BUN 7 (L) 11/06/2022 1229   CREATININE 1.00 10/17/2023 1530   CREATININE 1.08 (H) 01/08/2015 1652   CALCIUM  9.1 10/17/2023 1530   PROT 7.1 10/17/2023 1530   PROT 7.1 11/06/2022 1229   ALBUMIN 3.6 10/17/2023 1530   ALBUMIN 4.3 11/06/2022 1229   AST 14 (L) 10/17/2023 1530   ALT 15 10/17/2023 1530   ALKPHOS 84 10/17/2023 1530   BILITOT 0.5 10/17/2023 1530   BILITOT 0.4 11/06/2022 1229   GFRNONAA >60 10/17/2023 1530   GFRNONAA 55 (L) 11/26/2010 1424   GFRAA 71 07/13/2019 0953   GFRAA >60 11/26/2010 1424   Lab Results  Component Value Date   CHOL 164 07/25/2022   HDL 49 07/25/2022   LDLCALC 105 (H) 07/25/2022   TRIG 52 07/25/2022   CHOLHDL 3.3 07/25/2022   Lab Results  Component Value Date   HGBA1C 6.0 (H) 07/25/2022   No results found for: VITAMINB12 Lab Results  Component Value Date    TSH 0.825 11/06/2022      ASSESSMENT AND PLAN 63 y.o. year old female  has a past medical history of Allergy, Anemia, Arthritis, Asthma, Bilateral lower extremity edema, Depression, Fibroids (08/2002), Gross hematuria (05/2010), History of appendicitis, History of colon polyps (2017), Hyperlipidemia, Multiple renal cysts (05/24/2008), OA (osteoarthritis) of knee, Obesity, Plantar fasciitis, Postmenopausal, Pre-diabetes, Renal papillary necrosis, Seasonal allergies, Sickle cell anemia (HCC), Sickle cell trait, Sigmoid diverticulosis (09/26/2015), and Stroke (HCC). here with:  ICH: Initial left frontal ICH with left ACA distribution, concerning for hemorrhagic transformation.  Then new left frontal ICH lateral to previous hematoma, etiology unclear  Patient did not have repeat imaging as ordered at the last visit.  Therefore she has not been on any thrombotic therapy d/t hemorrhagic transformation.  Advised that I would discuss with Dr. Saidi about repeating imaging at this point.  I would be back in touch with the patient. discussed secondary stroke prevention measures and importance of close PCP follow up for aggressive stroke risk factor management. I have gone over the pathophysiology of stroke, warning signs and symptoms, risk factors and their management in some detail with instructions to go to the closest emergency room for symptoms of concern. HTN: BP goal <130/90.   HLD: LDL goal <70.  DMII: A1c goal<7.0.  Encouraged patient to monitor diet and encouraged exercise Continue regular follow-ups with PCP to discuss stroke risk factors   Duwaine Russell, MSN, NP-C 02/03/2024, 10:10 AM [facility] [address], [address] [place], [place] [address] [phone]  The patient's condition requires frequent monitoring and adjustments in the treatment plan, reflecting the ongoing complexity of care.  This provider is the continuing focal point for all needed services for this  condition.

## 2024-02-03 NOTE — Patient Instructions (Signed)
 Your Plan:   Blood pressure goal <130/90 Cholesterol LDL goal <70 Diabetes goal A1c <7 Monitor diet and try to exercise   Thank you for coming to see us  at [facility]. I hope we have been able to provide you high quality care today.  You may receive a patient satisfaction survey over the next few weeks. We would appreciate your feedback and comments so that we may continue to improve ourselves and the health of our patients.

## 2024-02-14 NOTE — Progress Notes (Signed)
 I agree with the above plan

## 2024-02-16 ENCOUNTER — Other Ambulatory Visit: Payer: Self-pay | Admitting: Licensed Clinical Social Worker

## 2024-02-17 NOTE — Progress Notes (Addendum)
-  review routing notes.   Discussed with Dr. Rosemarie- if patient is doing well clinically then we do not have to repeat- however if patient is willing then we can reoder- will have nurse call patient. No need for blood thinners- no previous history of ischemic stroke.

## 2024-02-18 NOTE — Patient Instructions (Signed)
 Visit Information  Anna Berry was given information about Medicaid Managed Care team care coordination services as a part of their Healthy [facility] Medicaid benefit. Ferrell Flam Schoene   If you would like to schedule transportation through your Healthy [facility] plan, please call the following number at least 2 days in advance of your appointment: [phone]  For information about your ride after you set it up, call Ride Assist at [phone]. Use this number to activate a Will Call pickup, or if your transportation is late for a scheduled pickup. Use this number, too, if you need to make a change or cancel a previously scheduled reservation.  If you need transportation services right away, call [phone]. The after-hours call center is staffed 24 hours to handle ride assistance and urgent reservation requests (including discharges) 365 days a year. Urgent trips include sick visits, hospital discharge requests and life-sustaining treatment.  Call the [facility] Line at [phone], at any time, 24 hours a day, 7 days a week. If you are in danger or need immediate medical attention call 911.   Please see education materials related to topics discussed provided by MyChart link.  Care plan and visit instructions communicated with the patient verbally today. Patient agrees to receive a copy in MyChart. Active MyChart status and patient understanding of how to access instructions and care plan via MyChart confirmed with patient.     Licensed Clinical Social Worker will on 1/26 at 12:30 PM  Rolin Kerns, LCSW [place]  [facility], [facility] Clinical Social Worker Direct Dial: [phone]  Fax: [phone] Website: [email or web address] 11:21 AM   Following is a copy of your plan of care:  There are no care plans that you recently modified to display for this patient.

## 2024-02-18 NOTE — Patient Outreach (Signed)
 Complex Care Management   Visit Note  02/16/2024  Name:  Anna Berry MRN: [id number] DOB: August 30, 1960  Situation: Referral received for Complex Care Management related to Mental/Behavioral Health diagnosis Anxiety and Depression I obtained verbal consent from Caregiver.  Visit completed with Caregiver  on the phone  Background:   Past Medical History:  Diagnosis Date   Allergy    Anemia    Arthritis    Asthma    allergies induced   Bilateral lower extremity edema    Depression    Fibroids 08/2002   Gross hematuria 05/2010   History of appendicitis    History of colon polyps 2017   Hyperlipidemia    Multiple renal cysts 05/24/2008   Two left renal parapelvic cysts.   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    Assessment: Patient Reported Symptoms:  Cognitive Cognitive Status: No symptoms reported, Alert and oriented to [place], place, and time, Normal speech and language skills, Poor judgment in daily scenarios Cognitive/Intellectual Conditions Management [RPT]: None reported or documented in medical history or problem list   Health Maintenance Behaviors: Annual physical exam  Neurological Neurological Review of Symptoms: Not assessed    HEENT HEENT Symptoms Reported: Not assessed      Cardiovascular Cardiovascular Symptoms Reported: Not assessed    Respiratory Respiratory Symptoms Reported: Not assesed    Endocrine Endocrine Symptoms Reported: Not assessed    Gastrointestinal Gastrointestinal Symptoms Reported: Not assessed      Genitourinary Genitourinary Symptoms Reported: Not assessed    Integumentary Integumentary Symptoms Reported: Not assessed    Musculoskeletal  Musculoskelatal Symptoms Reviewed: Not assessed        Psychosocial Psychosocial Symptoms Reported: Other Other Psychosocial Conditions: Caregiver Stress Behavioral Management Strategies: Coping strategies, Support system Major Change/Loss/Stressor/Fears (CP): Medical condition, self, Medical condition, family, Relationship concerns Techniques to Cope with Loss/Stress/Change: Diversional activities Quality of Family Relationships: involved    02/18/2024    PHQ2-9 Depression Screening   Little interest or pleasure in doing things    Feeling down, depressed, or hopeless    PHQ-2 - Total Score    Trouble falling or staying asleep, or sleeping too much    Feeling tired or having little energy    Poor appetite or overeating     Feeling bad about yourself - or that you are a failure or have let yourself or your family down    Trouble concentrating on things, such as reading the newspaper or watching television    Moving or speaking so slowly that other people could have noticed.  Or the opposite - being so fidgety or restless that you have been moving around a lot more than usual    Thoughts that you would be better off dead, or hurting yourself in some way    PHQ2-9 Total Score    If you checked off any problems, how difficult have these problems made it for you to do your work, take care of things at home, or get along with other people    Depression Interventions/Treatment      There were no vitals filed for  this visit.    Medications Reviewed Today     Reviewed by Ezzard Rolin BIRCH, LCSW (Social Worker) on 02/18/24 at 1111  Med List Status: <None>   Medication Order Taking? Sig Documenting Provider Last Dose Status Informant  acetaminophen  (TYLENOL ) 500 MG tablet [id number]  Take 1,000 mg by mouth daily as needed for moderate pain (pain score 4-6).  Patient taking differently: Take 1,000 mg by mouth as needed for moderate pain (pain score 4-6).   [provider]  Active  Spouse/Significant Other  albuterol  (VENTOLIN  HFA) 108 (90 Base) MCG/ACT inhaler [id number]  Inhale 2 puffs into the lungs every 6 (six) hours as needed for wheezing or shortness of breath. Malvina Ellen, MD  Active Spouse/Significant Other  loratadine  (CLARITIN ) 10 MG tablet [id number]  Take 10 mg by mouth daily as needed for allergies.   Patient taking differently: Take 10 mg by mouth as needed for allergies.   [provider]  Active Spouse/Significant Other  polyethylene glycol powder (GLYCOLAX /MIRALAX ) 17 GM/SCOOP powder [id number] No Take 17 g by mouth 2 (two) times daily as needed for mild constipation.  Patient not taking: Reported on 12/31/2023   Cherlyn Labella, MD Unknown Active   rosuvastatin  (CRESTOR ) 20 MG tablet [id number]  Take 1 tablet (20 mg total) by mouth daily.  Patient not taking: Reported on 02/03/2024   Romelle Booty, MD  Active   sennosides [facility]) 8.8 MG/5ML syrup [id number]  Place 5 mLs into feeding tube at bedtime as needed for mild constipation.  Patient not taking: Reported on 02/03/2024   Akula, Vijaya, MD  Active   Tetrahydrozoline HCl [facility] OP) [id number]  Place 1 drop into both eyes daily as needed (allergies).  Patient not taking: Reported on 02/03/2024   [provider]  Active Spouse/Significant Other            Recommendation:   Continue Current Plan of Care  Follow Up Plan:   Telephone follow-up 2-4 weeks  Rolin Ezzard, LCSW [place]  [facility], [facility] Clinical Social Worker Direct Dial: [phone]  Fax: [phone] Website: [email or web address] 11:20 AM

## 2024-02-24 ENCOUNTER — Telehealth: Payer: Self-pay | Admitting: *Deleted

## 2024-02-24 DIAGNOSIS — I618 Other nontraumatic intracerebral hemorrhage: Secondary | ICD-10-CM

## 2024-02-24 NOTE — Telephone Encounter (Signed)
-----   Message from Nurse Heather BROCKS, RN sent at 02/17/2024  3:38 PM EST -----  ----- Message ----- From: Sherryl Bouchard, NP Sent: 02/17/2024   8:09 AM EST To: [facility] Calls  Please call patient and advise that I discussed with Dr. Rosemarie that if she is willing we can reorder MRI/MRA of brain. However if she feels her symptoms have remained stable and no new symptoms we can monitor if she prefers

## 2024-02-24 NOTE — Telephone Encounter (Signed)
 Spoke to patient agreed to repeat MRI

## 2024-03-01 NOTE — Telephone Encounter (Signed)
 no auth required sent to GI [phone]

## 2024-03-14 ENCOUNTER — Telehealth: Payer: Self-pay | Admitting: Family Medicine

## 2024-03-14 NOTE — Telephone Encounter (Signed)
 Clinical info completed on Disability  form.  Placed form in PCP's box for completion.    When form is completed, please route note to "RN Team" and place in wall pocket in front office.   Aquilla Solian, CMA

## 2024-03-14 NOTE — Telephone Encounter (Signed)
 Pt husband dropped off form at front desk for disability.  Verified that patient section of form has been completed.  Last DOS/WCC with PCP was 12/27/2023.  Placed form in [facility] team folder to be completed by clinical staff.  Marjorie DELENA Kiang

## 2024-03-20 NOTE — Telephone Encounter (Signed)
 Reviewed, completed, and signed form.  Note routed to RN team inbasket and placed completed form in RN Wall pocket in the front office.  Payton Coward, MD  Patient will need to complete remainder of patient section

## 2024-03-21 NOTE — Telephone Encounter (Signed)
 Form placed up front for pickup.  Copy made for batch scanning.   Attempted to call patient to make aware, however no answer.   Please let her know the form is ready for pick up when she calls back.

## 2024-04-06 ENCOUNTER — Ambulatory Visit
Admission: RE | Admit: 2024-04-06 | Discharge: 2024-04-06 | Disposition: A | Discharge status: Home / Self Care | Source: Ambulatory Visit | Attending: Adult Health | Admitting: Adult Health

## 2024-04-06 DIAGNOSIS — I618 Other nontraumatic intracerebral hemorrhage: Secondary | ICD-10-CM

## 2024-04-06 MED ORDER — GADOPICLENOL 0.5 MMOL/ML IV SOLN
7.5000 mL | Freq: Once | INTRAVENOUS | Status: AC | PRN
  Administered 2024-04-06: 7.5 mL via INTRAVENOUS

## 2024-04-10 ENCOUNTER — Telehealth: Payer: Self-pay | Admitting: Licensed Clinical Social Worker

## 2024-04-10 ENCOUNTER — Encounter: Payer: Self-pay | Admitting: Licensed Clinical Social Worker

## 2024-04-10 ENCOUNTER — Ambulatory Visit: Payer: Self-pay | Admitting: Adult Health

## 2024-04-10 ENCOUNTER — Other Ambulatory Visit: Payer: Self-pay | Admitting: Neurology

## 2024-04-10 DIAGNOSIS — Q2730 Arteriovenous malformation, site unspecified: Secondary | ICD-10-CM

## 2024-04-10 DIAGNOSIS — I618 Other nontraumatic intracerebral hemorrhage: Secondary | ICD-10-CM

## 2024-04-10 NOTE — Telephone Encounter (Signed)
-----   Message from Eather Popp, MD sent at 04/09/2024  8:11 PM EST ----- Morey also sent me this. Order CT angio brain to r/o any underlying vascular lesion ----- Message ----- From: Sherryl Bouchard, NP Sent: 04/09/2024   4:22 PM EST To: Eather GORMAN Popp, MD; Bouchard Sherryl, NP  Dr. Popp,   Please review- patient's ICH was a year ago but she didn't repeat imaging then. Please let me know what you think.   [name]

## 2024-04-10 NOTE — Patient Instructions (Addendum)
 Harlene SAILOR Lippe - I have attempted to call you three times but have been unsuccessful in reaching you. I work with Romelle Booty, MD and am calling to support your healthcare needs. If I can be of assistance to you, please contact me at [phone].     Thank you,  Rolin Kerns, LCSW [place]  [facility], [facility] Clinical Social Worker Direct Dial: [phone]  Fax: [phone] Website: [email or web address] 3:41 PM

## 2024-04-10 NOTE — Telephone Encounter (Signed)
 I called and LVM.

## 2024-04-10 NOTE — Telephone Encounter (Signed)
 I called the patient LVM. Will try to call back again later

## 2024-04-10 NOTE — Telephone Encounter (Signed)
-----   Message from Eather Popp, MD sent at 04/09/2024  8:11 PM EST ----- Morey also sent me this. Order CT angio brain to r/o any underlying vascular lesion ----- Message ----- From: Sherryl Bouchard, NP Sent: 04/09/2024   4:22 PM EST To: Eather GORMAN Popp, MD; Bouchard Sherryl, NP  Dr. Popp,   Please review- patient's ICH was a year ago but she didn't repeat imaging then. Please let me know what you think.   Anna Berry

## 2024-04-11 ENCOUNTER — Telehealth: Payer: Self-pay | Admitting: Adult Health

## 2024-04-11 NOTE — Telephone Encounter (Signed)
 Referral for neurosurgery sent through [facility]. Phone: [phone], Fax: [phone]

## 2024-04-12 NOTE — Telephone Encounter (Addendum)
 I was able to speak with pt's husband Ubaldo (on [place]) who confirmed patient will be ok with a CT-A head. He states they are both following up on everything that needs follow-up. He will watch for a call to schedule it. Pt has a neurosurgery appt on 2/3.  His questions were answered and he thanked me for the call.   ----- Message from Duwaine Russell, NP sent at 04/12/2024  6:34 AM EST ----- Molli Gethers: I am out of the office today. Can you try to reach this patient. I have tried twice and left messages.

## 2024-04-14 NOTE — Addendum Note (Signed)
 Addended by: SHERRYL DUWAINE SQUIBB on: 04/14/2024 11:39 AM   Modules accepted: Orders

## 2024-04-17 ENCOUNTER — Telehealth: Payer: Self-pay | Admitting: Adult Health

## 2024-04-17 NOTE — Telephone Encounter (Signed)
 no auth required sent to GI [phone]

## 2024-04-18 ENCOUNTER — Telehealth: Admitting: Neurosurgery

## 2024-04-18 ENCOUNTER — Ambulatory Visit: Admitting: Neurosurgery

## 2024-04-18 DIAGNOSIS — I610 Nontraumatic intracerebral hemorrhage in hemisphere, subcortical: Secondary | ICD-10-CM

## 2024-04-18 NOTE — Progress Notes (Signed)
 Virtual Visit via Video Note  I connected with Anna Berry on 04/18/24 at  9:20 AM EST by a video enabled telemedicine application and verified that I am speaking with the correct [place] using two identifiers.  Location: Patient: Home Provider: Clinic   I discussed the limitations of evaluation and management by telemedicine and the availability of in [facility] appointments. The patient expressed understanding and agreed to proceed.  Discussed the use of AI scribe software for clinical note transcription with the patient, who gave verbal consent to proceed.  History of Present Illness Anna Berry is a 64 year old female with two hemorrhagic strokes in 2024 who presents for neurosurgical evaluation of recurrent intracerebral hemorrhage and persistent neurological deficits.  She experienced two intracerebral hemorrhages on the same day in 2024. The initial event occurred at home following shopping, characterized by aphasia, postural abnormality, and a blank facial expression. Within hours, she developed right hemiparesis, including significant weakness in the right upper extremity. The second event occurred shortly thereafter, either en route to or at the hospital. She was admitted to intensive care for approximately one week, where daily MRI imaging confirmed hemorrhagic stroke.  Following hospitalization, she completed over sixty days of inpatient rehabilitation with partial neurological improvement. Outpatient follow-up was delayed due to transportation and financial barriers, resulting in a prolonged interval between neurology appointments. She has since obtained Medicaid, which has enabled further care, including a recent MRI as recommended by neurology.  She continues to experience persistent neurological deficits, including expressive language hesitation, memory impairment, depression, and a right hand tremor. She has difficulty with object retention and has not achieved full  neurological recovery since the strokes.  PLAN: I looked at the images and this indeed shows that on August 25, 2022 she was initially admitted with a parasagittal oblong hemorrhage and later on had a second hemorrhage lateral to it initially she had speech arrest but the second hemorrhage is likely because the right-sided hemiparesis.  I looked at the CT angiogram which was negative around that time and given the appearance, a CT venogram was also obtained and this to was negative.  It is unclear whether there may have been a cortical venous thrombosis but I cannot tell that.  She can now is doing well but has some word finding difficulties.  She was lost to follow-up for a full year but recently went and saw neurology again and they ordered an MRI.  This shows that she still has enhancement in the bed of the old hemorrhage though it is better than it was previously.  I do not have a good understanding of what this enhancement is but if this was a malignancy, it likely would have gotten worse and therefore my level of suspicion is low.  Patient is referred to me for evaluation of the presence of an arteriovenous malformation.  I went over this with her and her husband and I told them that it is important to get the CT angiogram to reevaluate.  If this shows any level of suspicion for a vascular abnormality, then we will get a dedicated diagnostic angiogram.  The voiced understanding and after this is done, we will talk again.    I discussed the assessment and treatment plan with the patient. The patient was provided an opportunity to ask questions and all were answered. The patient agreed with the plan and demonstrated an understanding of the instructions.   The patient was advised to call back or seek an in[facility]  evaluation if the symptoms worsen or if the condition fails to improve as anticipated.  I provided 15 minutes of non-face-to-face time during this encounter.   Tabytha Gradillas, MD

## 2024-04-25 ENCOUNTER — Other Ambulatory Visit
